# Patient Record
Sex: Male | Born: 1960 | Race: White | Hispanic: No | Marital: Single | Smoking: Never smoker
Health system: Southern US, Community
[De-identification: ages and names within clinical notes are randomized; demographics above are authoritative.]

## PROBLEM LIST (undated history)

## (undated) DIAGNOSIS — F329 Major depressive disorder, single episode, unspecified: Secondary | ICD-10-CM

## (undated) DIAGNOSIS — F99 Mental disorder, not otherwise specified: Secondary | ICD-10-CM

## (undated) DIAGNOSIS — E785 Hyperlipidemia, unspecified: Secondary | ICD-10-CM

## (undated) DIAGNOSIS — F32A Depression, unspecified: Secondary | ICD-10-CM

## (undated) DIAGNOSIS — E119 Type 2 diabetes mellitus without complications: Secondary | ICD-10-CM

## (undated) DIAGNOSIS — I10 Essential (primary) hypertension: Secondary | ICD-10-CM

## (undated) HISTORY — DX: Mental disorder, not otherwise specified: F99

## (undated) HISTORY — DX: Major depressive disorder, single episode, unspecified: F32.9

## (undated) HISTORY — DX: Essential (primary) hypertension: I10

## (undated) HISTORY — DX: Depression, unspecified: F32.A

## (undated) HISTORY — DX: Hyperlipidemia, unspecified: E78.5

## (undated) HISTORY — DX: Type 2 diabetes mellitus without complications: E11.9

## (undated) HISTORY — PX: VENTRAL HERNIA REPAIR: SHX424

## (undated) HISTORY — PX: LAPAROSCOPIC CHOLECYSTECTOMY: SUR755

---

## 2004-02-15 ENCOUNTER — Other Ambulatory Visit: Payer: Self-pay

## 2005-11-12 ENCOUNTER — Ambulatory Visit: Payer: Self-pay | Admitting: Gastroenterology

## 2005-11-25 ENCOUNTER — Ambulatory Visit: Payer: Self-pay | Admitting: Unknown Physician Specialty

## 2006-05-19 ENCOUNTER — Encounter: Payer: Self-pay | Admitting: Family Medicine

## 2006-06-04 ENCOUNTER — Encounter: Payer: Self-pay | Admitting: Family Medicine

## 2007-02-04 DIAGNOSIS — F3012 Manic episode without psychotic symptoms, moderate: Secondary | ICD-10-CM | POA: Insufficient documentation

## 2007-02-04 DIAGNOSIS — I10 Essential (primary) hypertension: Secondary | ICD-10-CM | POA: Insufficient documentation

## 2007-06-10 ENCOUNTER — Ambulatory Visit: Payer: Self-pay | Admitting: Family Medicine

## 2007-06-23 ENCOUNTER — Ambulatory Visit: Payer: Self-pay | Admitting: Family Medicine

## 2007-07-05 ENCOUNTER — Ambulatory Visit: Payer: Self-pay | Admitting: Internal Medicine

## 2007-07-07 ENCOUNTER — Ambulatory Visit: Payer: Self-pay | Admitting: Internal Medicine

## 2007-07-08 ENCOUNTER — Ambulatory Visit: Payer: Self-pay | Admitting: Gastroenterology

## 2007-07-08 LAB — HM COLONOSCOPY: HM COLON: NORMAL

## 2007-08-05 ENCOUNTER — Ambulatory Visit: Payer: Self-pay | Admitting: Internal Medicine

## 2007-10-12 ENCOUNTER — Ambulatory Visit: Payer: Self-pay | Admitting: Internal Medicine

## 2008-06-27 DIAGNOSIS — B351 Tinea unguium: Secondary | ICD-10-CM | POA: Insufficient documentation

## 2009-02-15 LAB — HM DEXA SCAN: HM DEXA SCAN: NORMAL

## 2010-03-07 ENCOUNTER — Emergency Department: Payer: Self-pay | Admitting: Emergency Medicine

## 2010-05-01 ENCOUNTER — Ambulatory Visit: Payer: Self-pay | Admitting: Surgery

## 2010-05-06 ENCOUNTER — Ambulatory Visit: Payer: Self-pay | Admitting: General Practice

## 2010-05-07 ENCOUNTER — Ambulatory Visit: Payer: Self-pay | Admitting: Urology

## 2010-05-22 ENCOUNTER — Ambulatory Visit: Payer: Self-pay | Admitting: Urology

## 2010-06-04 ENCOUNTER — Ambulatory Visit: Payer: Self-pay | Admitting: Urology

## 2010-09-17 ENCOUNTER — Ambulatory Visit: Payer: Self-pay | Admitting: Surgery

## 2010-09-17 DIAGNOSIS — I10 Essential (primary) hypertension: Secondary | ICD-10-CM

## 2010-09-24 ENCOUNTER — Ambulatory Visit: Payer: Self-pay | Admitting: Surgery

## 2010-11-26 ENCOUNTER — Ambulatory Visit: Payer: Self-pay | Admitting: Urology

## 2010-12-03 ENCOUNTER — Ambulatory Visit: Payer: Self-pay | Admitting: Urology

## 2010-12-03 HISTORY — PX: LAPAROSCOPY: SHX197

## 2011-08-07 DIAGNOSIS — F79 Unspecified intellectual disabilities: Secondary | ICD-10-CM | POA: Diagnosis not present

## 2011-08-07 DIAGNOSIS — R109 Unspecified abdominal pain: Secondary | ICD-10-CM | POA: Diagnosis not present

## 2011-08-07 DIAGNOSIS — F71 Moderate intellectual disabilities: Secondary | ICD-10-CM | POA: Diagnosis not present

## 2011-08-07 DIAGNOSIS — R52 Pain, unspecified: Secondary | ICD-10-CM | POA: Diagnosis not present

## 2011-08-07 DIAGNOSIS — R197 Diarrhea, unspecified: Secondary | ICD-10-CM | POA: Diagnosis not present

## 2011-08-07 DIAGNOSIS — R112 Nausea with vomiting, unspecified: Secondary | ICD-10-CM | POA: Diagnosis not present

## 2011-09-03 ENCOUNTER — Emergency Department: Payer: Self-pay

## 2011-09-03 DIAGNOSIS — R52 Pain, unspecified: Secondary | ICD-10-CM | POA: Diagnosis not present

## 2011-09-03 DIAGNOSIS — R109 Unspecified abdominal pain: Secondary | ICD-10-CM | POA: Diagnosis not present

## 2011-09-03 DIAGNOSIS — R079 Chest pain, unspecified: Secondary | ICD-10-CM | POA: Diagnosis not present

## 2011-09-03 LAB — COMPREHENSIVE METABOLIC PANEL
Albumin: 3.5 g/dL (ref 3.4–5.0)
Anion Gap: 10 (ref 7–16)
Bilirubin,Total: 0.4 mg/dL (ref 0.2–1.0)
Calcium, Total: 8.4 mg/dL — ABNORMAL LOW (ref 8.5–10.1)
Chloride: 102 mmol/L (ref 98–107)
Co2: 29 mmol/L (ref 21–32)
Creatinine: 0.84 mg/dL (ref 0.60–1.30)
EGFR (African American): >60
EGFR (Non-African Amer.): >60
Osmolality: 283 (ref 275–301)
Potassium: 3.9 mmol/L (ref 3.5–5.1)
SGOT(AST): 33 U/L (ref 15–37)
Sodium: 141 mmol/L (ref 136–145)

## 2011-09-03 LAB — TROPONIN I
Troponin-I: <0.02 ng/mL
Troponin-I: <0.02 ng/mL

## 2011-09-03 LAB — URINALYSIS, COMPLETE
Bilirubin,UR: NEGATIVE
Blood: NEGATIVE
Ketone: NEGATIVE
Ph: 6 (ref 4.5–8.0)
Protein: NEGATIVE
Specific Gravity: 1.011 (ref 1.003–1.030)
Squamous Epithelial: 1

## 2011-09-03 LAB — CBC
HCT: 44.3 % (ref 40.0–52.0)
HGB: 14.9 g/dL (ref 13.0–18.0)
MCH: 32.5 pg (ref 26.0–34.0)
RBC: 4.58 10*6/uL (ref 4.40–5.90)
RDW: 12.5 % (ref 11.5–14.5)
WBC: 8 10*3/uL (ref 3.8–10.6)

## 2011-09-03 LAB — CK TOTAL AND CKMB (NOT AT ARMC): CK-MB: <0.5 ng/mL — ABNORMAL LOW (ref 0.5–3.6)

## 2011-09-19 DIAGNOSIS — F71 Moderate intellectual disabilities: Secondary | ICD-10-CM | POA: Diagnosis not present

## 2011-10-03 DIAGNOSIS — K5282 Eosinophilic colitis: Secondary | ICD-10-CM | POA: Diagnosis not present

## 2011-10-03 DIAGNOSIS — R509 Fever, unspecified: Secondary | ICD-10-CM | POA: Diagnosis not present

## 2011-10-03 DIAGNOSIS — R112 Nausea with vomiting, unspecified: Secondary | ICD-10-CM | POA: Diagnosis not present

## 2011-10-08 ENCOUNTER — Emergency Department: Payer: Self-pay | Admitting: Unknown Physician Specialty

## 2011-10-08 DIAGNOSIS — R109 Unspecified abdominal pain: Secondary | ICD-10-CM | POA: Diagnosis not present

## 2011-10-08 DIAGNOSIS — E785 Hyperlipidemia, unspecified: Secondary | ICD-10-CM | POA: Diagnosis not present

## 2011-10-08 DIAGNOSIS — Z7982 Long term (current) use of aspirin: Secondary | ICD-10-CM | POA: Diagnosis not present

## 2011-10-08 DIAGNOSIS — I10 Essential (primary) hypertension: Secondary | ICD-10-CM | POA: Diagnosis not present

## 2011-10-08 DIAGNOSIS — F329 Major depressive disorder, single episode, unspecified: Secondary | ICD-10-CM | POA: Diagnosis not present

## 2011-10-08 DIAGNOSIS — F3289 Other specified depressive episodes: Secondary | ICD-10-CM | POA: Diagnosis not present

## 2011-10-08 DIAGNOSIS — F411 Generalized anxiety disorder: Secondary | ICD-10-CM | POA: Diagnosis not present

## 2011-10-08 DIAGNOSIS — Z79899 Other long term (current) drug therapy: Secondary | ICD-10-CM | POA: Diagnosis not present

## 2011-10-08 DIAGNOSIS — G4730 Sleep apnea, unspecified: Secondary | ICD-10-CM | POA: Diagnosis not present

## 2011-10-08 LAB — COMPREHENSIVE METABOLIC PANEL
Albumin: 3.8 g/dL (ref 3.4–5.0)
Alkaline Phosphatase: 145 U/L — ABNORMAL HIGH (ref 50–136)
Anion Gap: 10 (ref 7–16)
BUN: 12 mg/dL (ref 7–18)
Bilirubin,Total: 0.3 mg/dL (ref 0.2–1.0)
Co2: 30 mmol/L (ref 21–32)
Creatinine: 1 mg/dL (ref 0.60–1.30)
EGFR (African American): >60
Glucose: 138 mg/dL — ABNORMAL HIGH (ref 65–99)
Osmolality: 283 (ref 275–301)
Potassium: 4 mmol/L (ref 3.5–5.1)
SGPT (ALT): 76 U/L
Sodium: 141 mmol/L (ref 136–145)
Total Protein: 7.8 g/dL (ref 6.4–8.2)

## 2011-10-08 LAB — URINALYSIS, COMPLETE
Bilirubin,UR: NEGATIVE
Blood: NEGATIVE
Glucose,UR: NEGATIVE mg/dL (ref 0–75)
Ketone: NEGATIVE
Leukocyte Esterase: NEGATIVE
Nitrite: NEGATIVE
RBC,UR: <1 /HPF (ref 0–5)
Squamous Epithelial: 2

## 2011-10-08 LAB — CBC
MCH: 33 pg (ref 26.0–34.0)
MCHC: 34.6 g/dL (ref 32.0–36.0)
MCV: 95 fL (ref 80–100)
Platelet: 152 10*3/uL (ref 150–440)
RDW: 12.6 % (ref 11.5–14.5)

## 2011-10-08 LAB — LIPASE, BLOOD: Lipase: 147 U/L (ref 73–393)

## 2011-10-13 ENCOUNTER — Emergency Department: Payer: Self-pay | Admitting: Unknown Physician Specialty

## 2011-10-13 DIAGNOSIS — B351 Tinea unguium: Secondary | ICD-10-CM | POA: Diagnosis not present

## 2011-10-13 DIAGNOSIS — L259 Unspecified contact dermatitis, unspecified cause: Secondary | ICD-10-CM | POA: Diagnosis not present

## 2011-10-13 DIAGNOSIS — M79609 Pain in unspecified limb: Secondary | ICD-10-CM | POA: Diagnosis not present

## 2011-10-22 DIAGNOSIS — R109 Unspecified abdominal pain: Secondary | ICD-10-CM | POA: Diagnosis not present

## 2011-10-22 DIAGNOSIS — K219 Gastro-esophageal reflux disease without esophagitis: Secondary | ICD-10-CM | POA: Diagnosis not present

## 2011-10-22 DIAGNOSIS — R110 Nausea: Secondary | ICD-10-CM | POA: Diagnosis not present

## 2011-10-29 DIAGNOSIS — N401 Enlarged prostate with lower urinary tract symptoms: Secondary | ICD-10-CM | POA: Diagnosis not present

## 2011-10-29 DIAGNOSIS — R339 Retention of urine, unspecified: Secondary | ICD-10-CM | POA: Diagnosis not present

## 2011-10-29 DIAGNOSIS — IMO0002 Reserved for concepts with insufficient information to code with codable children: Secondary | ICD-10-CM | POA: Diagnosis not present

## 2011-11-12 ENCOUNTER — Ambulatory Visit: Payer: Self-pay | Admitting: Gastroenterology

## 2011-11-12 DIAGNOSIS — E669 Obesity, unspecified: Secondary | ICD-10-CM | POA: Diagnosis not present

## 2011-11-12 DIAGNOSIS — I10 Essential (primary) hypertension: Secondary | ICD-10-CM | POA: Diagnosis not present

## 2011-11-12 DIAGNOSIS — R110 Nausea: Secondary | ICD-10-CM | POA: Diagnosis not present

## 2011-11-12 DIAGNOSIS — K2970 Gastritis, unspecified, without bleeding: Secondary | ICD-10-CM | POA: Diagnosis not present

## 2011-11-12 DIAGNOSIS — G4730 Sleep apnea, unspecified: Secondary | ICD-10-CM | POA: Diagnosis not present

## 2011-11-12 DIAGNOSIS — K2990 Gastroduodenitis, unspecified, without bleeding: Secondary | ICD-10-CM | POA: Diagnosis not present

## 2011-11-12 DIAGNOSIS — G40909 Epilepsy, unspecified, not intractable, without status epilepticus: Secondary | ICD-10-CM | POA: Diagnosis not present

## 2011-11-12 DIAGNOSIS — R1013 Epigastric pain: Secondary | ICD-10-CM | POA: Diagnosis not present

## 2011-11-12 DIAGNOSIS — Z79899 Other long term (current) drug therapy: Secondary | ICD-10-CM | POA: Diagnosis not present

## 2011-11-12 DIAGNOSIS — G609 Hereditary and idiopathic neuropathy, unspecified: Secondary | ICD-10-CM | POA: Diagnosis not present

## 2011-11-12 DIAGNOSIS — I509 Heart failure, unspecified: Secondary | ICD-10-CM | POA: Diagnosis not present

## 2011-11-12 DIAGNOSIS — Z7982 Long term (current) use of aspirin: Secondary | ICD-10-CM | POA: Diagnosis not present

## 2011-11-12 DIAGNOSIS — R109 Unspecified abdominal pain: Secondary | ICD-10-CM | POA: Diagnosis not present

## 2011-11-12 DIAGNOSIS — K219 Gastro-esophageal reflux disease without esophagitis: Secondary | ICD-10-CM | POA: Diagnosis not present

## 2011-11-14 LAB — PATHOLOGY REPORT

## 2011-12-10 DIAGNOSIS — K219 Gastro-esophageal reflux disease without esophagitis: Secondary | ICD-10-CM | POA: Diagnosis not present

## 2012-01-05 DIAGNOSIS — IMO0002 Reserved for concepts with insufficient information to code with codable children: Secondary | ICD-10-CM | POA: Diagnosis not present

## 2012-01-05 DIAGNOSIS — N390 Urinary tract infection, site not specified: Secondary | ICD-10-CM | POA: Diagnosis not present

## 2012-01-12 DIAGNOSIS — B351 Tinea unguium: Secondary | ICD-10-CM | POA: Diagnosis not present

## 2012-01-12 DIAGNOSIS — M79609 Pain in unspecified limb: Secondary | ICD-10-CM | POA: Diagnosis not present

## 2012-01-29 DIAGNOSIS — F71 Moderate intellectual disabilities: Secondary | ICD-10-CM | POA: Diagnosis not present

## 2012-01-30 DIAGNOSIS — F3289 Other specified depressive episodes: Secondary | ICD-10-CM | POA: Diagnosis not present

## 2012-01-30 DIAGNOSIS — E039 Hypothyroidism, unspecified: Secondary | ICD-10-CM | POA: Diagnosis not present

## 2012-01-30 DIAGNOSIS — Z23 Encounter for immunization: Secondary | ICD-10-CM | POA: Diagnosis not present

## 2012-01-30 DIAGNOSIS — I10 Essential (primary) hypertension: Secondary | ICD-10-CM | POA: Diagnosis not present

## 2012-01-30 DIAGNOSIS — F329 Major depressive disorder, single episode, unspecified: Secondary | ICD-10-CM | POA: Diagnosis not present

## 2012-01-30 DIAGNOSIS — IMO0001 Reserved for inherently not codable concepts without codable children: Secondary | ICD-10-CM | POA: Diagnosis not present

## 2012-02-02 DIAGNOSIS — E039 Hypothyroidism, unspecified: Secondary | ICD-10-CM | POA: Diagnosis not present

## 2012-02-24 ENCOUNTER — Ambulatory Visit: Payer: Self-pay | Admitting: Family Medicine

## 2012-02-24 DIAGNOSIS — F329 Major depressive disorder, single episode, unspecified: Secondary | ICD-10-CM | POA: Diagnosis not present

## 2012-02-24 DIAGNOSIS — Z0000 Encounter for general adult medical examination without abnormal findings: Secondary | ICD-10-CM | POA: Diagnosis not present

## 2012-02-24 DIAGNOSIS — E785 Hyperlipidemia, unspecified: Secondary | ICD-10-CM | POA: Diagnosis not present

## 2012-02-24 DIAGNOSIS — Z7189 Other specified counseling: Secondary | ICD-10-CM | POA: Diagnosis not present

## 2012-02-24 DIAGNOSIS — E119 Type 2 diabetes mellitus without complications: Secondary | ICD-10-CM | POA: Diagnosis not present

## 2012-02-24 DIAGNOSIS — F3289 Other specified depressive episodes: Secondary | ICD-10-CM | POA: Diagnosis not present

## 2012-02-25 DIAGNOSIS — Z125 Encounter for screening for malignant neoplasm of prostate: Secondary | ICD-10-CM | POA: Diagnosis not present

## 2012-02-25 DIAGNOSIS — E119 Type 2 diabetes mellitus without complications: Secondary | ICD-10-CM | POA: Diagnosis not present

## 2012-02-25 DIAGNOSIS — Z0000 Encounter for general adult medical examination without abnormal findings: Secondary | ICD-10-CM | POA: Diagnosis not present

## 2012-03-04 ENCOUNTER — Ambulatory Visit: Payer: Self-pay | Admitting: Family Medicine

## 2012-03-04 DIAGNOSIS — Z7189 Other specified counseling: Secondary | ICD-10-CM | POA: Diagnosis not present

## 2012-03-04 DIAGNOSIS — E119 Type 2 diabetes mellitus without complications: Secondary | ICD-10-CM | POA: Diagnosis not present

## 2012-03-11 DIAGNOSIS — IMO0002 Reserved for concepts with insufficient information to code with codable children: Secondary | ICD-10-CM | POA: Diagnosis not present

## 2012-03-31 DIAGNOSIS — I10 Essential (primary) hypertension: Secondary | ICD-10-CM | POA: Diagnosis not present

## 2012-03-31 DIAGNOSIS — Z23 Encounter for immunization: Secondary | ICD-10-CM | POA: Diagnosis not present

## 2012-03-31 DIAGNOSIS — IMO0001 Reserved for inherently not codable concepts without codable children: Secondary | ICD-10-CM | POA: Diagnosis not present

## 2012-04-04 ENCOUNTER — Ambulatory Visit: Payer: Self-pay | Admitting: Family Medicine

## 2012-04-04 DIAGNOSIS — Z7189 Other specified counseling: Secondary | ICD-10-CM | POA: Diagnosis not present

## 2012-04-04 DIAGNOSIS — E119 Type 2 diabetes mellitus without complications: Secondary | ICD-10-CM | POA: Diagnosis not present

## 2012-04-13 DIAGNOSIS — M79609 Pain in unspecified limb: Secondary | ICD-10-CM | POA: Diagnosis not present

## 2012-04-13 DIAGNOSIS — B351 Tinea unguium: Secondary | ICD-10-CM | POA: Diagnosis not present

## 2012-04-28 DIAGNOSIS — F71 Moderate intellectual disabilities: Secondary | ICD-10-CM | POA: Diagnosis not present

## 2012-05-04 ENCOUNTER — Ambulatory Visit: Payer: Self-pay | Admitting: Family Medicine

## 2012-05-07 DIAGNOSIS — R159 Full incontinence of feces: Secondary | ICD-10-CM | POA: Diagnosis not present

## 2012-05-07 DIAGNOSIS — F329 Major depressive disorder, single episode, unspecified: Secondary | ICD-10-CM | POA: Diagnosis not present

## 2012-05-07 DIAGNOSIS — F3012 Manic episode without psychotic symptoms, moderate: Secondary | ICD-10-CM | POA: Diagnosis not present

## 2012-05-07 DIAGNOSIS — F3289 Other specified depressive episodes: Secondary | ICD-10-CM | POA: Diagnosis not present

## 2012-05-07 DIAGNOSIS — R1084 Generalized abdominal pain: Secondary | ICD-10-CM | POA: Diagnosis not present

## 2012-06-04 DIAGNOSIS — I10 Essential (primary) hypertension: Secondary | ICD-10-CM | POA: Diagnosis not present

## 2012-06-04 DIAGNOSIS — E039 Hypothyroidism, unspecified: Secondary | ICD-10-CM | POA: Diagnosis not present

## 2012-06-04 DIAGNOSIS — F329 Major depressive disorder, single episode, unspecified: Secondary | ICD-10-CM | POA: Diagnosis not present

## 2012-06-04 DIAGNOSIS — F3289 Other specified depressive episodes: Secondary | ICD-10-CM | POA: Diagnosis not present

## 2012-06-04 DIAGNOSIS — E119 Type 2 diabetes mellitus without complications: Secondary | ICD-10-CM | POA: Diagnosis not present

## 2012-06-07 DIAGNOSIS — F70 Mild intellectual disabilities: Secondary | ICD-10-CM | POA: Diagnosis not present

## 2012-06-08 DIAGNOSIS — F70 Mild intellectual disabilities: Secondary | ICD-10-CM | POA: Diagnosis not present

## 2012-06-09 DIAGNOSIS — E119 Type 2 diabetes mellitus without complications: Secondary | ICD-10-CM | POA: Diagnosis not present

## 2012-06-16 DIAGNOSIS — Z23 Encounter for immunization: Secondary | ICD-10-CM | POA: Diagnosis not present

## 2012-07-09 DIAGNOSIS — N401 Enlarged prostate with lower urinary tract symptoms: Secondary | ICD-10-CM | POA: Diagnosis not present

## 2012-07-09 DIAGNOSIS — R339 Retention of urine, unspecified: Secondary | ICD-10-CM | POA: Diagnosis not present

## 2012-07-09 DIAGNOSIS — IMO0002 Reserved for concepts with insufficient information to code with codable children: Secondary | ICD-10-CM | POA: Diagnosis not present

## 2012-07-09 DIAGNOSIS — R300 Dysuria: Secondary | ICD-10-CM | POA: Insufficient documentation

## 2012-07-09 DIAGNOSIS — N138 Other obstructive and reflux uropathy: Secondary | ICD-10-CM | POA: Insufficient documentation

## 2012-07-15 DIAGNOSIS — B351 Tinea unguium: Secondary | ICD-10-CM | POA: Diagnosis not present

## 2012-07-15 DIAGNOSIS — M79609 Pain in unspecified limb: Secondary | ICD-10-CM | POA: Diagnosis not present

## 2012-08-02 DIAGNOSIS — E119 Type 2 diabetes mellitus without complications: Secondary | ICD-10-CM | POA: Diagnosis not present

## 2012-08-02 DIAGNOSIS — F3012 Manic episode without psychotic symptoms, moderate: Secondary | ICD-10-CM | POA: Diagnosis not present

## 2012-08-02 DIAGNOSIS — E785 Hyperlipidemia, unspecified: Secondary | ICD-10-CM | POA: Diagnosis not present

## 2012-08-02 DIAGNOSIS — I10 Essential (primary) hypertension: Secondary | ICD-10-CM | POA: Diagnosis not present

## 2012-08-03 DIAGNOSIS — E785 Hyperlipidemia, unspecified: Secondary | ICD-10-CM | POA: Diagnosis not present

## 2012-08-10 DIAGNOSIS — F71 Moderate intellectual disabilities: Secondary | ICD-10-CM | POA: Diagnosis not present

## 2012-09-07 DIAGNOSIS — Z79899 Other long term (current) drug therapy: Secondary | ICD-10-CM | POA: Diagnosis not present

## 2012-10-14 DIAGNOSIS — M79609 Pain in unspecified limb: Secondary | ICD-10-CM | POA: Diagnosis not present

## 2012-10-14 DIAGNOSIS — B351 Tinea unguium: Secondary | ICD-10-CM | POA: Diagnosis not present

## 2012-11-12 DIAGNOSIS — K2960 Other gastritis without bleeding: Secondary | ICD-10-CM | POA: Diagnosis not present

## 2012-11-12 DIAGNOSIS — R112 Nausea with vomiting, unspecified: Secondary | ICD-10-CM | POA: Diagnosis not present

## 2012-11-12 DIAGNOSIS — I10 Essential (primary) hypertension: Secondary | ICD-10-CM | POA: Diagnosis not present

## 2012-12-07 DIAGNOSIS — F71 Moderate intellectual disabilities: Secondary | ICD-10-CM | POA: Diagnosis not present

## 2012-12-15 DIAGNOSIS — E785 Hyperlipidemia, unspecified: Secondary | ICD-10-CM | POA: Diagnosis not present

## 2012-12-15 DIAGNOSIS — E119 Type 2 diabetes mellitus without complications: Secondary | ICD-10-CM | POA: Diagnosis not present

## 2012-12-15 DIAGNOSIS — I10 Essential (primary) hypertension: Secondary | ICD-10-CM | POA: Diagnosis not present

## 2012-12-15 DIAGNOSIS — E039 Hypothyroidism, unspecified: Secondary | ICD-10-CM | POA: Diagnosis not present

## 2013-01-05 DIAGNOSIS — R339 Retention of urine, unspecified: Secondary | ICD-10-CM | POA: Diagnosis not present

## 2013-01-05 DIAGNOSIS — R300 Dysuria: Secondary | ICD-10-CM | POA: Diagnosis not present

## 2013-01-05 DIAGNOSIS — IMO0002 Reserved for concepts with insufficient information to code with codable children: Secondary | ICD-10-CM | POA: Diagnosis not present

## 2013-01-05 DIAGNOSIS — N401 Enlarged prostate with lower urinary tract symptoms: Secondary | ICD-10-CM | POA: Diagnosis not present

## 2013-01-13 DIAGNOSIS — B351 Tinea unguium: Secondary | ICD-10-CM | POA: Diagnosis not present

## 2013-01-13 DIAGNOSIS — M79609 Pain in unspecified limb: Secondary | ICD-10-CM | POA: Diagnosis not present

## 2013-02-08 DIAGNOSIS — F71 Moderate intellectual disabilities: Secondary | ICD-10-CM | POA: Diagnosis not present

## 2013-02-18 DIAGNOSIS — Z79899 Other long term (current) drug therapy: Secondary | ICD-10-CM | POA: Diagnosis not present

## 2013-03-03 DIAGNOSIS — Z0000 Encounter for general adult medical examination without abnormal findings: Secondary | ICD-10-CM | POA: Diagnosis not present

## 2013-03-09 DIAGNOSIS — F71 Moderate intellectual disabilities: Secondary | ICD-10-CM | POA: Diagnosis not present

## 2013-04-10 ENCOUNTER — Emergency Department: Payer: Self-pay | Admitting: Emergency Medicine

## 2013-04-10 DIAGNOSIS — I509 Heart failure, unspecified: Secondary | ICD-10-CM | POA: Diagnosis not present

## 2013-04-10 DIAGNOSIS — R109 Unspecified abdominal pain: Secondary | ICD-10-CM | POA: Diagnosis not present

## 2013-04-10 DIAGNOSIS — R1934 Left lower quadrant abdominal rigidity: Secondary | ICD-10-CM | POA: Diagnosis not present

## 2013-04-10 DIAGNOSIS — L02419 Cutaneous abscess of limb, unspecified: Secondary | ICD-10-CM | POA: Diagnosis not present

## 2013-04-10 DIAGNOSIS — R112 Nausea with vomiting, unspecified: Secondary | ICD-10-CM | POA: Diagnosis not present

## 2013-04-10 DIAGNOSIS — Z9889 Other specified postprocedural states: Secondary | ICD-10-CM | POA: Diagnosis not present

## 2013-04-10 DIAGNOSIS — R9431 Abnormal electrocardiogram [ECG] [EKG]: Secondary | ICD-10-CM | POA: Diagnosis not present

## 2013-04-10 DIAGNOSIS — R1110 Vomiting, unspecified: Secondary | ICD-10-CM | POA: Diagnosis not present

## 2013-04-10 DIAGNOSIS — R1033 Periumbilical pain: Secondary | ICD-10-CM | POA: Diagnosis not present

## 2013-04-10 DIAGNOSIS — I10 Essential (primary) hypertension: Secondary | ICD-10-CM | POA: Diagnosis not present

## 2013-04-10 LAB — CBC
HCT: 40.4 % (ref 40.0–52.0)
HGB: 14.4 g/dL (ref 13.0–18.0)
MCH: 32.8 pg (ref 26.0–34.0)
MCV: 92 fL (ref 80–100)
Platelet: 104 10*3/uL — ABNORMAL LOW (ref 150–440)
RDW: 12.6 % (ref 11.5–14.5)
WBC: 12 10*3/uL — ABNORMAL HIGH (ref 3.8–10.6)

## 2013-04-10 LAB — URINALYSIS, COMPLETE
Glucose,UR: NEGATIVE mg/dL (ref 0–75)
RBC,UR: 2 /HPF (ref 0–5)
Specific Gravity: 1.023 (ref 1.003–1.030)
Squamous Epithelial: 2

## 2013-04-10 LAB — COMPREHENSIVE METABOLIC PANEL
Alkaline Phosphatase: 114 U/L (ref 50–136)
Anion Gap: 7 (ref 7–16)
BUN: 14 mg/dL (ref 7–18)
Calcium, Total: 8.5 mg/dL (ref 8.5–10.1)
Co2: 27 mmol/L (ref 21–32)
Creatinine: 1.08 mg/dL (ref 0.60–1.30)
EGFR (Non-African Amer.): >60
Osmolality: 272 (ref 275–301)
Potassium: 3.7 mmol/L (ref 3.5–5.1)
SGOT(AST): 48 U/L — ABNORMAL HIGH (ref 15–37)
SGPT (ALT): 48 U/L (ref 12–78)
Sodium: 135 mmol/L — ABNORMAL LOW (ref 136–145)

## 2013-04-13 DIAGNOSIS — L02419 Cutaneous abscess of limb, unspecified: Secondary | ICD-10-CM | POA: Diagnosis not present

## 2013-04-13 DIAGNOSIS — E119 Type 2 diabetes mellitus without complications: Secondary | ICD-10-CM | POA: Diagnosis not present

## 2013-04-13 DIAGNOSIS — K5289 Other specified noninfective gastroenteritis and colitis: Secondary | ICD-10-CM | POA: Diagnosis not present

## 2013-04-13 DIAGNOSIS — Z23 Encounter for immunization: Secondary | ICD-10-CM | POA: Diagnosis not present

## 2013-04-15 DIAGNOSIS — I8390 Asymptomatic varicose veins of unspecified lower extremity: Secondary | ICD-10-CM | POA: Diagnosis not present

## 2013-04-15 DIAGNOSIS — L02419 Cutaneous abscess of limb, unspecified: Secondary | ICD-10-CM | POA: Diagnosis not present

## 2013-04-15 LAB — CULTURE, BLOOD (SINGLE)

## 2013-04-21 DIAGNOSIS — E039 Hypothyroidism, unspecified: Secondary | ICD-10-CM | POA: Diagnosis not present

## 2013-04-21 DIAGNOSIS — E119 Type 2 diabetes mellitus without complications: Secondary | ICD-10-CM | POA: Diagnosis not present

## 2013-04-21 DIAGNOSIS — B351 Tinea unguium: Secondary | ICD-10-CM | POA: Diagnosis not present

## 2013-04-21 DIAGNOSIS — I10 Essential (primary) hypertension: Secondary | ICD-10-CM | POA: Diagnosis not present

## 2013-04-21 DIAGNOSIS — M79609 Pain in unspecified limb: Secondary | ICD-10-CM | POA: Diagnosis not present

## 2013-06-07 DIAGNOSIS — F71 Moderate intellectual disabilities: Secondary | ICD-10-CM | POA: Diagnosis not present

## 2013-06-14 DIAGNOSIS — E119 Type 2 diabetes mellitus without complications: Secondary | ICD-10-CM | POA: Diagnosis not present

## 2013-06-24 DIAGNOSIS — R109 Unspecified abdominal pain: Secondary | ICD-10-CM | POA: Diagnosis not present

## 2013-06-24 DIAGNOSIS — R52 Pain, unspecified: Secondary | ICD-10-CM | POA: Diagnosis not present

## 2013-06-24 DIAGNOSIS — I10 Essential (primary) hypertension: Secondary | ICD-10-CM | POA: Diagnosis not present

## 2013-07-21 ENCOUNTER — Ambulatory Visit (INDEPENDENT_AMBULATORY_CARE_PROVIDER_SITE_OTHER): Payer: Medicare Other | Admitting: Podiatrist

## 2013-07-21 ENCOUNTER — Encounter: Payer: Self-pay | Admitting: Podiatrist

## 2013-07-21 VITALS — BP 121/76 | HR 55 | Resp 16 | Ht 73.0 in | Wt 271.4 lb

## 2013-07-21 DIAGNOSIS — B351 Tinea unguium: Secondary | ICD-10-CM

## 2013-07-21 DIAGNOSIS — M79609 Pain in unspecified limb: Secondary | ICD-10-CM | POA: Diagnosis not present

## 2013-07-21 NOTE — Progress Notes (Signed)
HPI:  Patient presents today for follow up of foot and nail care. Denies any new complaints today.  Objective:  Patients chart is reviewed.  Neurovascular status unchanged.  Patients nails are thickened, discolored, distrophic, friable and brittle with yellow-brown discoloration. Patient subjectively relates they are painful with shoes and with ambulation of bilateral feet.  Assessment:  Symptomatic onychomycosis  Plan:  Discussed treatment options and alternatives.  The symptomatic toenails were debrided through manual an mechanical means without complication.  Return appointment recommended at routine intervals of 3 months    Porshia Blizzard, DPM   

## 2013-07-26 IMAGING — CR DG CHEST 2V
1 series · 3 of 3 positions shown · non-contrast
Comparison: none

REASON FOR EXAM: chest pain
COMMENTS:   May transport without cardiac monitor

PROCEDURE:     DXR - DXR CHEST PA (OR AP) AND LATERAL  - September 03, 2011 [DATE]
RESULT:     No acute abnormality is identified. The lungs are clear. The
cardiovascular structures are unremarkable.

[Series 1: w chest pa · 0.14mm/px · 3 of 3 slices shown]
[im 1/3]
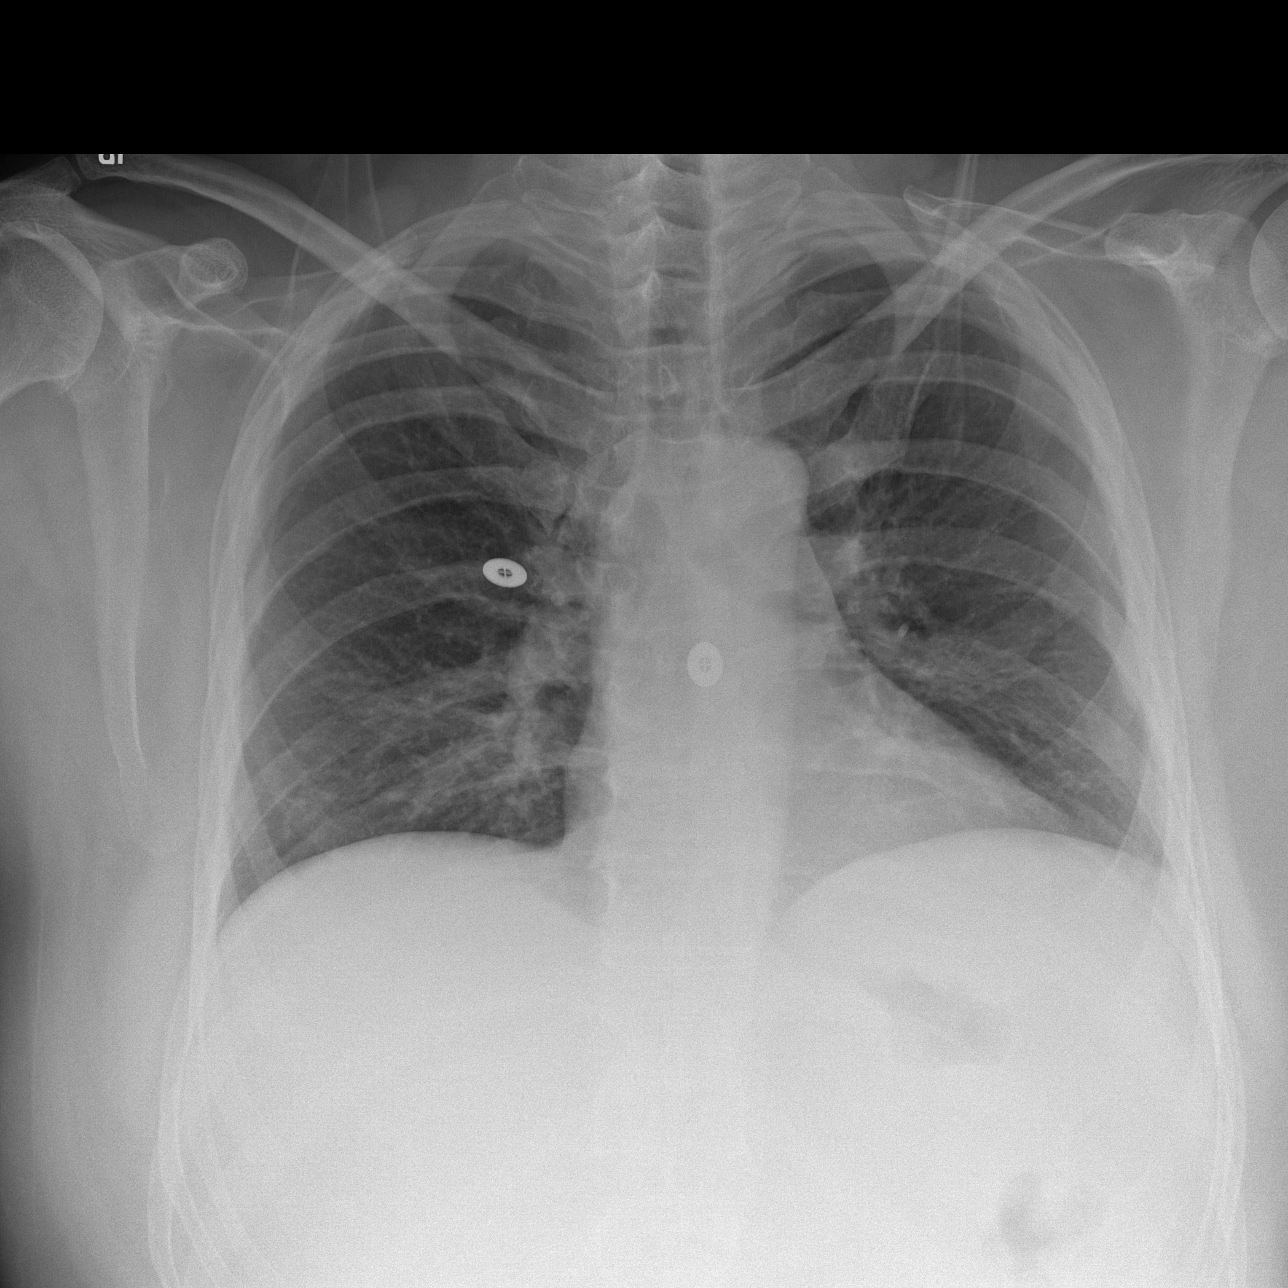
[im 2/3]
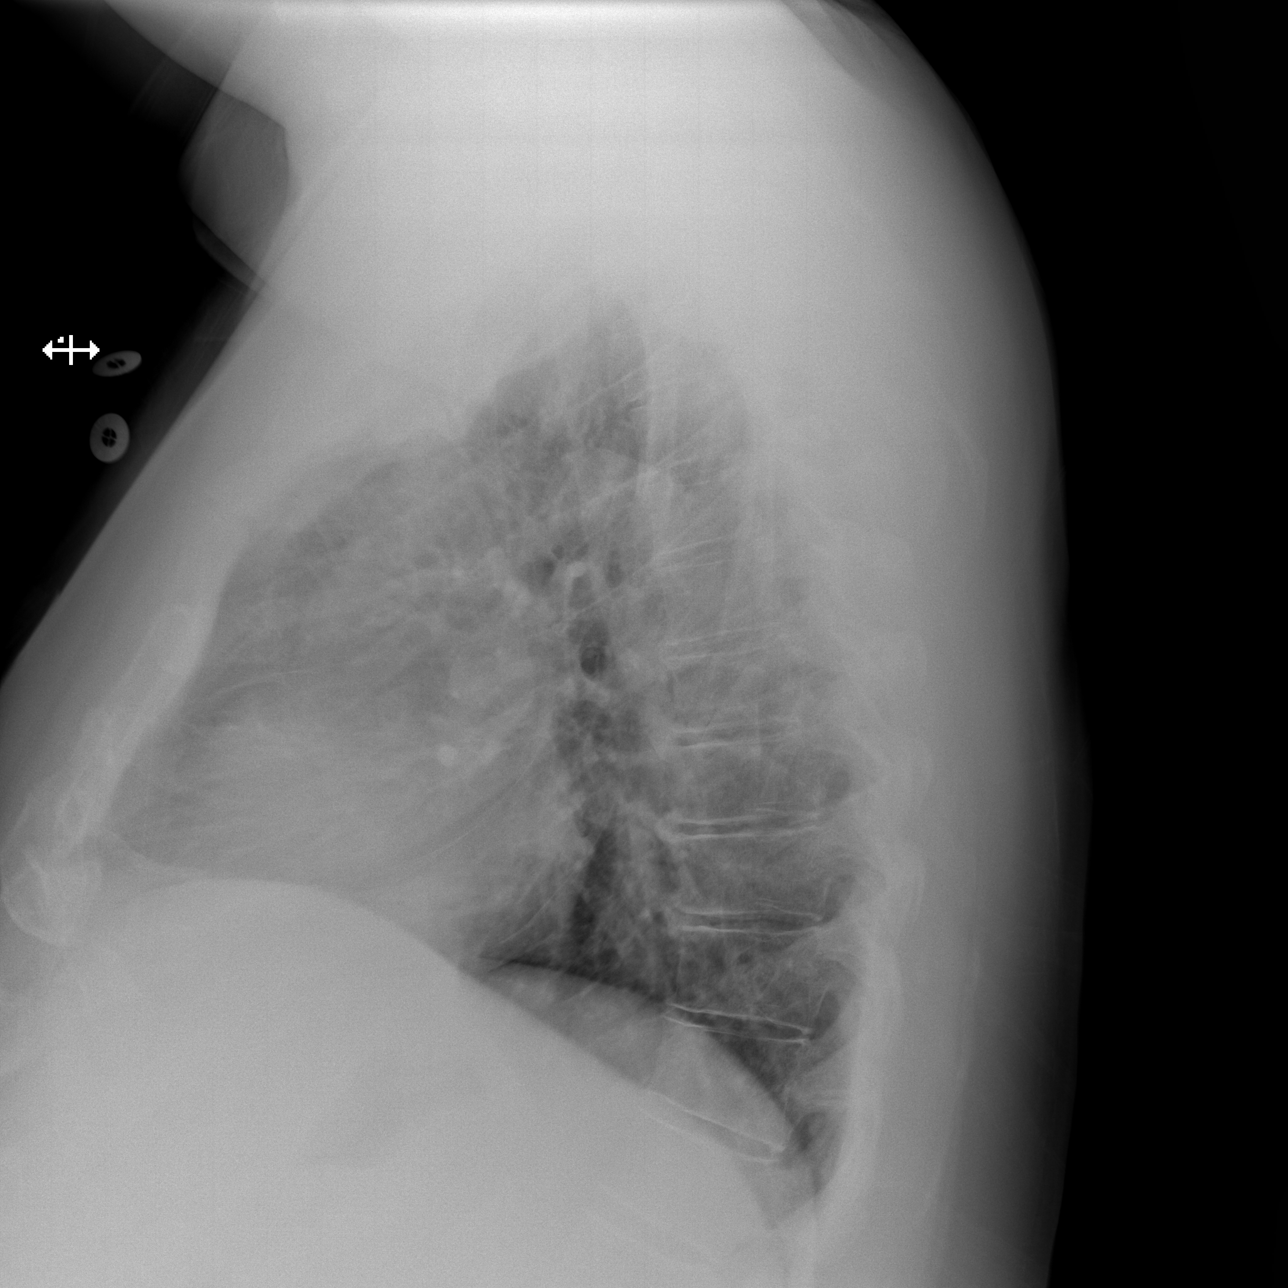
[im 3/3]
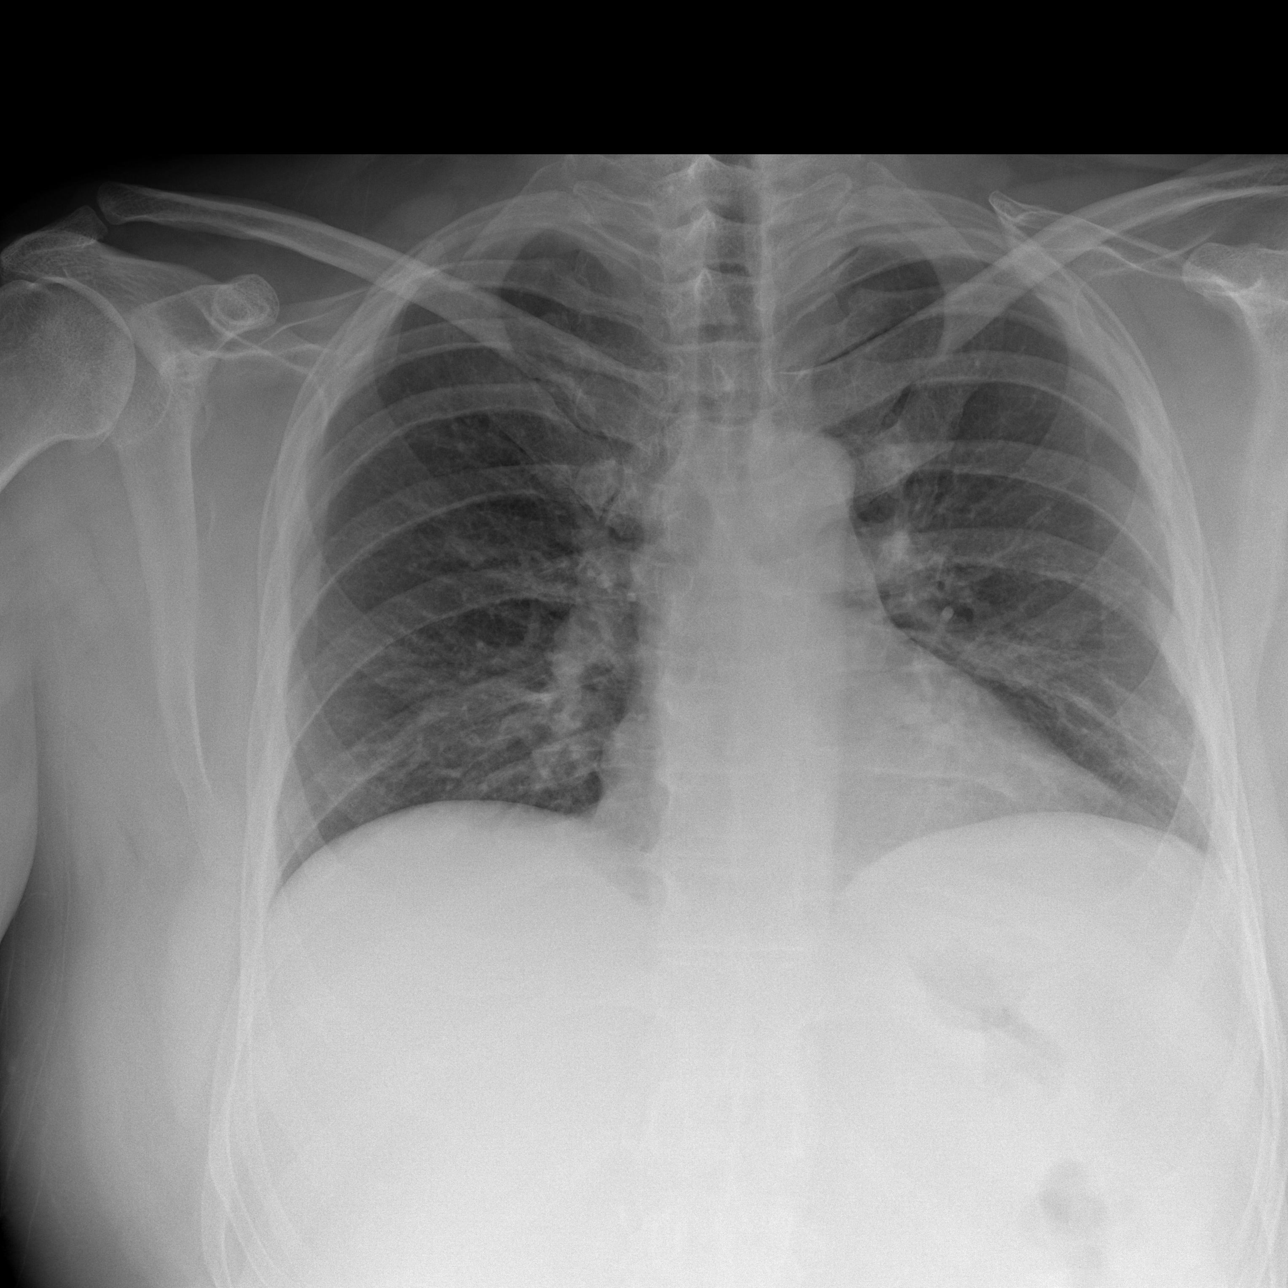

[3 of 3 positions shown; findings below may reference images not displayed]

IMPRESSION: No acute cardiopulmonary disease.

## 2013-08-30 IMAGING — CT CT ABD-PELV W/O CM
1 of 4 series · 13 of 32 positions shown, 19 images · non-contrast
Comparison: None

REASON FOR EXAM: (1) left flank luq pain; (2) luq flank pain
COMMENTS:

PROCEDURE:     CT  - CT ABDOMEN AND PELVIS W[DATE]  [DATE]
RESULT:     Indication: Left flank pain
TECHNIQUE: Multiple axial images from the lung bases to the symphysis pubis
were obtained without oral and without intravenous contrast.

[Series 2: soft tissue · axial · 0.87mm/px · z∈[-1560,-1116]mm · 13 of 174 slices shown, 19 images]
[im 13/174  soft-tissue]
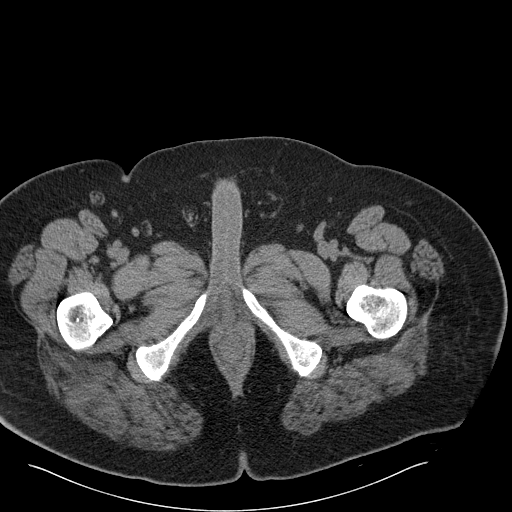
[im 13/174  bone]
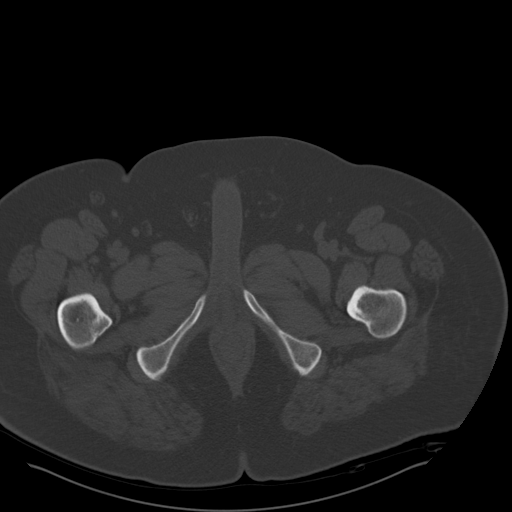
[im 25/174  soft-tissue]
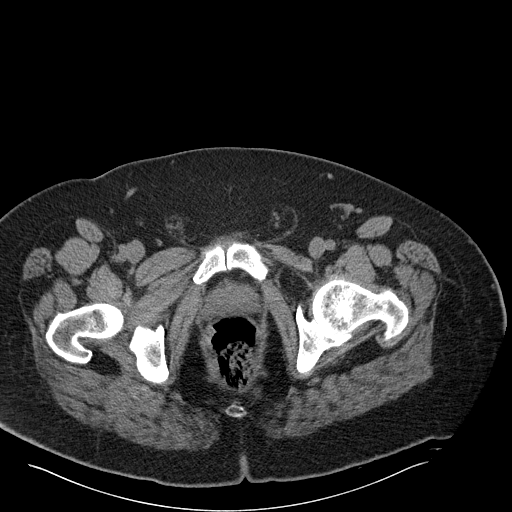
[im 38/174  soft-tissue]
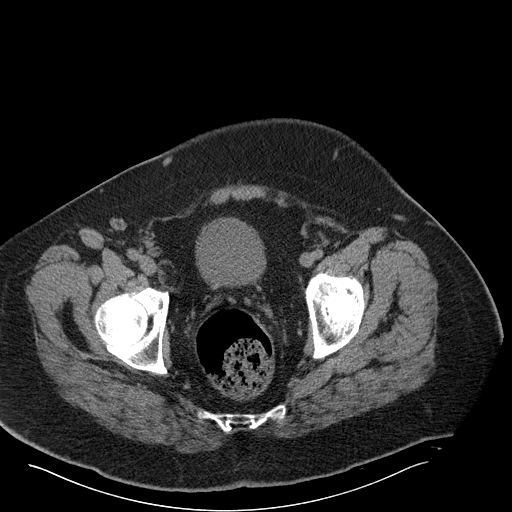
[im 50/174  soft-tissue]
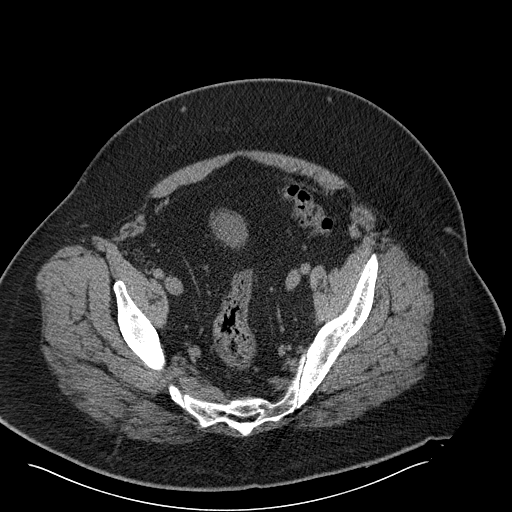
[im 62/174  soft-tissue]
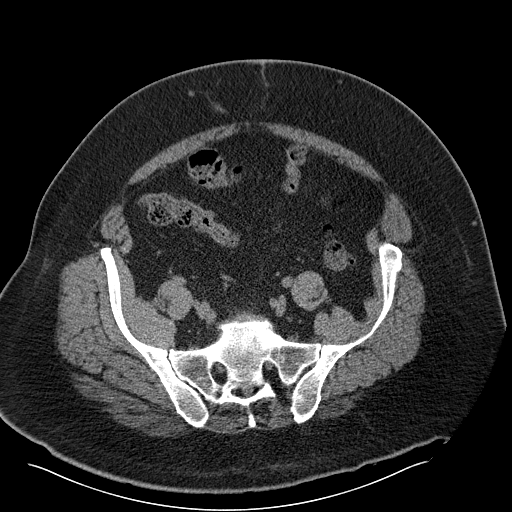
[im 75/174  soft-tissue]
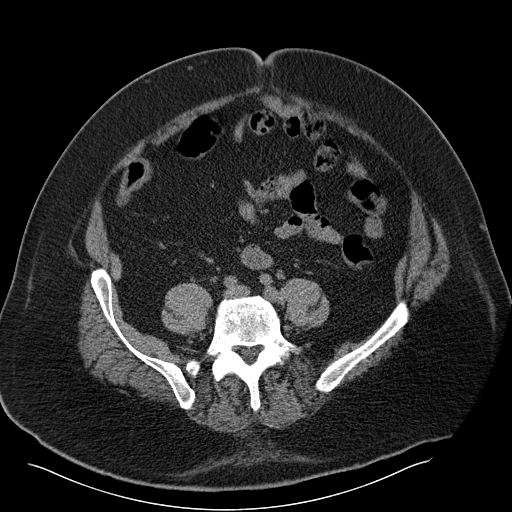
[im 87/174  soft-tissue]
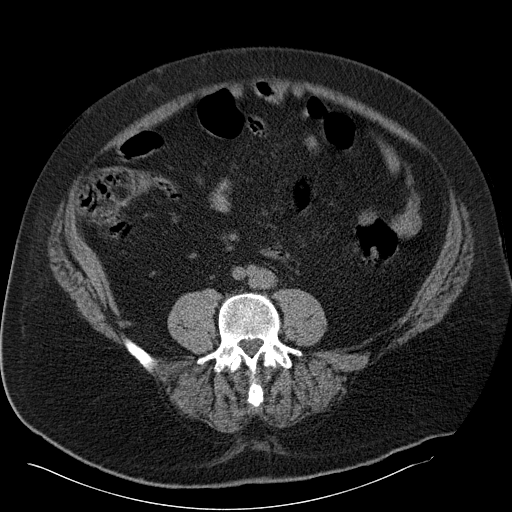
[im 99/174  soft-tissue]
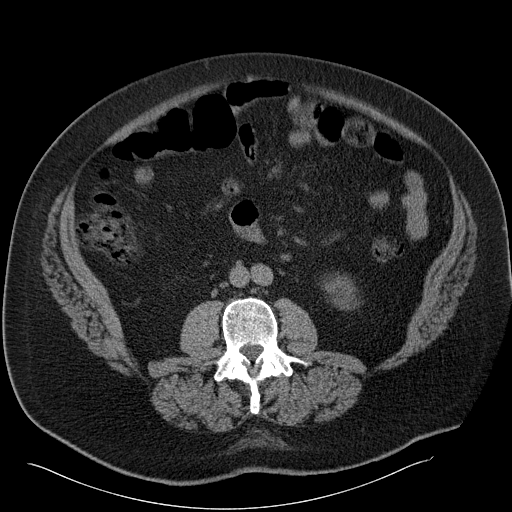
[im 112/174  soft-tissue]
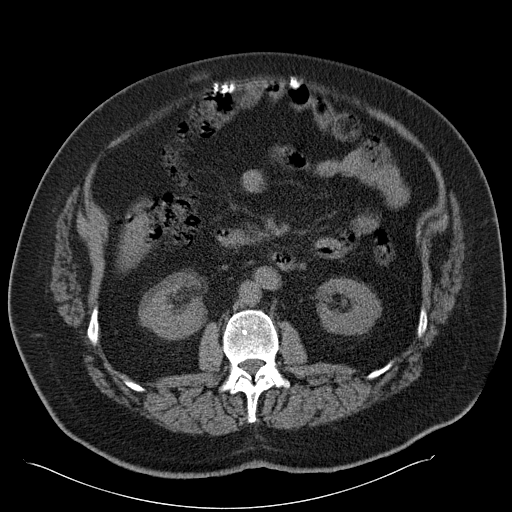
[im 112/174  bone]
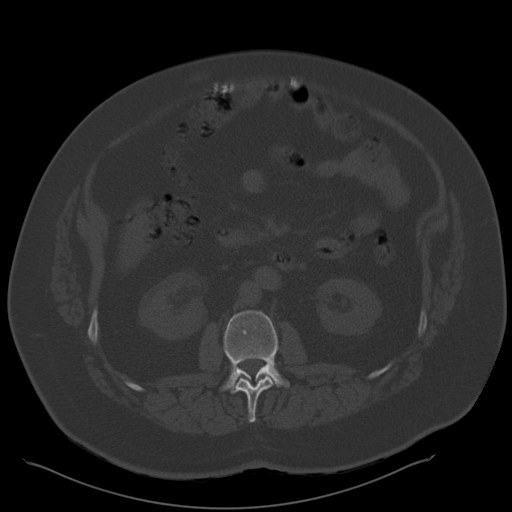
[im 124/174  soft-tissue]
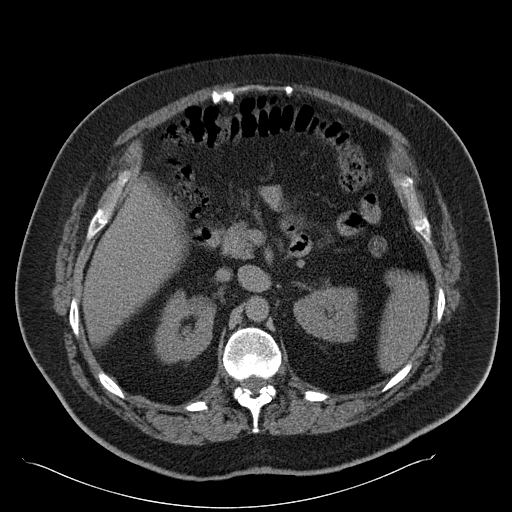
[im 124/174  lung]
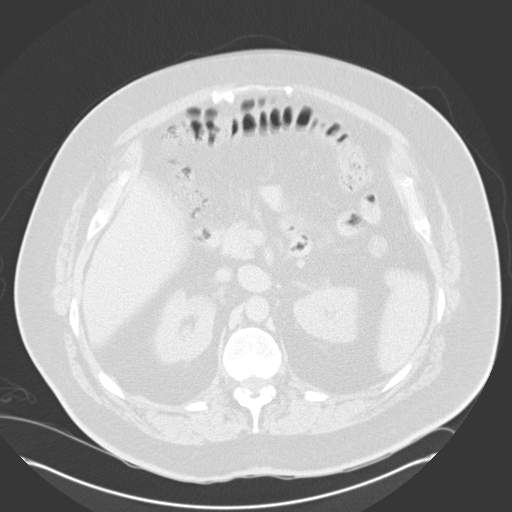
[im 136/174  soft-tissue]
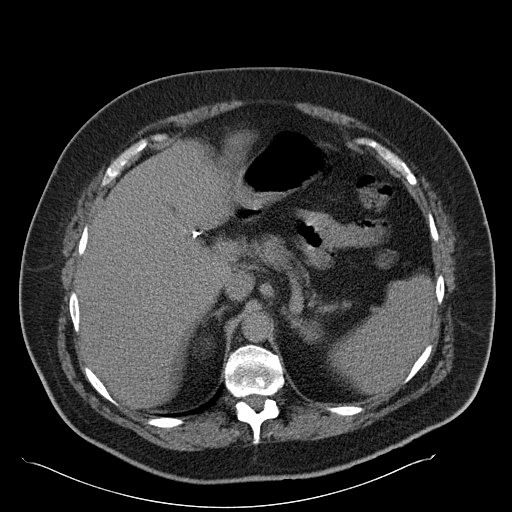
[im 136/174  lung]
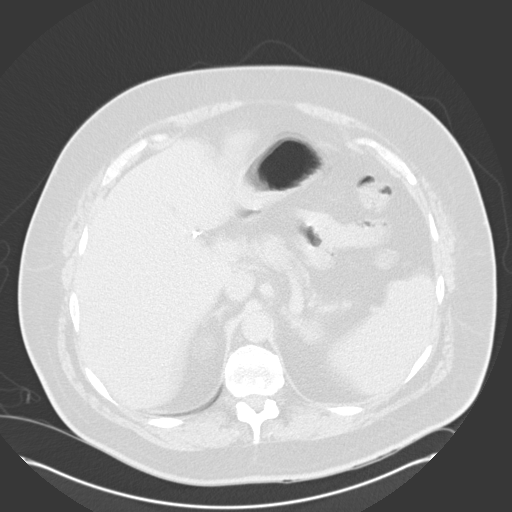
[im 149/174  soft-tissue]
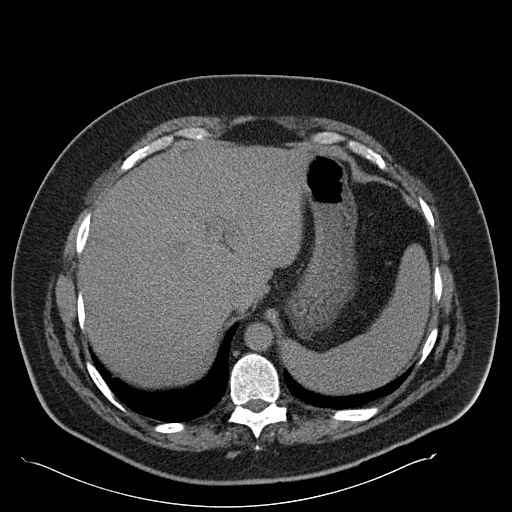
[im 149/174  lung]
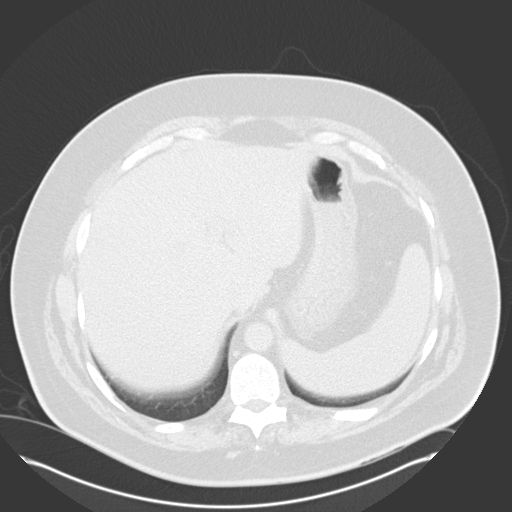
[im 161/174  soft-tissue]
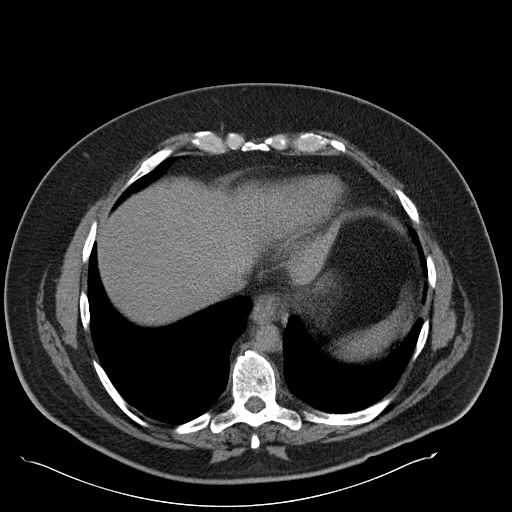
[im 161/174  lung]
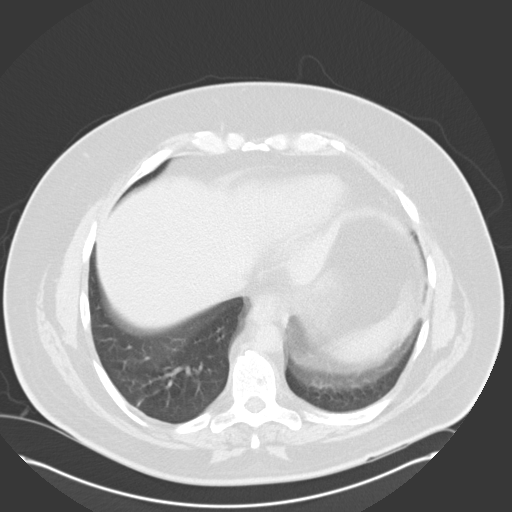

[13 of 32 positions shown; findings below may reference images not displayed]

FINDINGS: The lung bases are clear. There is no pleural or pericardial effusions.

No renal, ureteral, or bladder calculi. No obstructive uropathy. No
perinephric stranding is seen. The kidneys are symmetric in size without
evidence for exophytic mass. The bladder is unremarkable.

The liver demonstrates no focal abnormality. The gallbladder is surgically
absent. The spleen demonstrates no focal abnormality. The adrenal glands and
pancreas are normal.

The unopacified stomach, duodenum, small intestine, and large intestine are
unremarkable, but evaluation is limited by lack of oral contrast. There is a
normal caliber appendix in the right lower quadrant without periappendiceal
inflammatory changes. There is no pneumoperitoneum, pneumatosis, or portal
venous gas. There is no abdominal or pelvic free fluid. There is no
lymphadenopathy.

The abdominal aorta is normal in caliber .

The osseous structures are unremarkable.
IMPRESSION: 1. No urolithiasis or obstructive uropathy.

## 2013-08-31 DIAGNOSIS — D485 Neoplasm of uncertain behavior of skin: Secondary | ICD-10-CM | POA: Diagnosis not present

## 2013-10-05 DIAGNOSIS — L723 Sebaceous cyst: Secondary | ICD-10-CM | POA: Diagnosis not present

## 2013-10-05 DIAGNOSIS — D2110 Benign neoplasm of connective and other soft tissue of unspecified upper limb, including shoulder: Secondary | ICD-10-CM | POA: Diagnosis not present

## 2013-10-20 ENCOUNTER — Ambulatory Visit: Payer: Medicaid Other | Admitting: Podiatrist

## 2013-11-10 ENCOUNTER — Ambulatory Visit (INDEPENDENT_AMBULATORY_CARE_PROVIDER_SITE_OTHER): Payer: Medicare Other | Admitting: Podiatrist

## 2013-11-10 VITALS — Resp 16 | Ht 75.0 in | Wt 270.0 lb

## 2013-11-10 DIAGNOSIS — M79609 Pain in unspecified limb: Secondary | ICD-10-CM

## 2013-11-10 DIAGNOSIS — B351 Tinea unguium: Secondary | ICD-10-CM | POA: Diagnosis not present

## 2013-11-10 NOTE — Progress Notes (Signed)

## 2013-12-06 DIAGNOSIS — R300 Dysuria: Secondary | ICD-10-CM | POA: Diagnosis not present

## 2013-12-06 DIAGNOSIS — R109 Unspecified abdominal pain: Secondary | ICD-10-CM | POA: Diagnosis not present

## 2013-12-06 DIAGNOSIS — R319 Hematuria, unspecified: Secondary | ICD-10-CM | POA: Diagnosis not present

## 2013-12-06 DIAGNOSIS — F71 Moderate intellectual disabilities: Secondary | ICD-10-CM | POA: Diagnosis not present

## 2013-12-06 DIAGNOSIS — R52 Pain, unspecified: Secondary | ICD-10-CM | POA: Diagnosis not present

## 2013-12-19 DIAGNOSIS — E119 Type 2 diabetes mellitus without complications: Secondary | ICD-10-CM | POA: Diagnosis not present

## 2013-12-19 DIAGNOSIS — R112 Nausea with vomiting, unspecified: Secondary | ICD-10-CM | POA: Diagnosis not present

## 2013-12-19 DIAGNOSIS — I10 Essential (primary) hypertension: Secondary | ICD-10-CM | POA: Diagnosis not present

## 2013-12-19 DIAGNOSIS — E785 Hyperlipidemia, unspecified: Secondary | ICD-10-CM | POA: Diagnosis not present

## 2013-12-20 DIAGNOSIS — E785 Hyperlipidemia, unspecified: Secondary | ICD-10-CM | POA: Diagnosis not present

## 2013-12-20 DIAGNOSIS — E119 Type 2 diabetes mellitus without complications: Secondary | ICD-10-CM | POA: Diagnosis not present

## 2014-02-09 ENCOUNTER — Ambulatory Visit: Payer: Medicare Other | Admitting: Podiatrist

## 2014-03-06 DIAGNOSIS — Z9181 History of falling: Secondary | ICD-10-CM | POA: Diagnosis not present

## 2014-03-06 DIAGNOSIS — Z1331 Encounter for screening for depression: Secondary | ICD-10-CM | POA: Diagnosis not present

## 2014-03-06 DIAGNOSIS — Z125 Encounter for screening for malignant neoplasm of prostate: Secondary | ICD-10-CM | POA: Diagnosis not present

## 2014-03-06 DIAGNOSIS — Z0000 Encounter for general adult medical examination without abnormal findings: Secondary | ICD-10-CM | POA: Diagnosis not present

## 2014-03-07 DIAGNOSIS — Z125 Encounter for screening for malignant neoplasm of prostate: Secondary | ICD-10-CM | POA: Diagnosis not present

## 2014-03-07 DIAGNOSIS — Z1211 Encounter for screening for malignant neoplasm of colon: Secondary | ICD-10-CM | POA: Diagnosis not present

## 2014-03-07 DIAGNOSIS — E039 Hypothyroidism, unspecified: Secondary | ICD-10-CM | POA: Diagnosis not present

## 2014-03-07 DIAGNOSIS — I10 Essential (primary) hypertension: Secondary | ICD-10-CM | POA: Diagnosis not present

## 2014-03-23 ENCOUNTER — Ambulatory Visit (INDEPENDENT_AMBULATORY_CARE_PROVIDER_SITE_OTHER): Payer: Medicare Other | Admitting: Podiatry

## 2014-03-23 DIAGNOSIS — B351 Tinea unguium: Secondary | ICD-10-CM | POA: Diagnosis not present

## 2014-03-23 DIAGNOSIS — M79609 Pain in unspecified limb: Secondary | ICD-10-CM

## 2014-03-23 DIAGNOSIS — M79676 Pain in unspecified toe(s): Secondary | ICD-10-CM

## 2014-03-23 NOTE — Patient Instructions (Signed)
Diabetes and Foot Care Diabetes may cause you to have problems because of poor blood supply (circulation) to your feet and legs. This may cause the skin on your feet to become thinner, break easier, and heal more slowly. Your skin may become dry, and the skin may peel and crack. You may also have nerve damage in your legs and feet causing decreased feeling in them. You may not notice minor injuries to your feet that could lead to infections or more serious problems. Taking care of your feet is one of the most important things you can do for yourself.  HOME CARE INSTRUCTIONS  Wear shoes at all times, even in the house. Do not go barefoot. Bare feet are easily injured.  Check your feet daily for blisters, cuts, and redness. If you cannot see the bottom of your feet, use a mirror or ask someone for help.  Wash your feet with warm water (do not use hot water) and mild soap. Then pat your feet and the areas between your toes until they are completely dry. Do not soak your feet as this can dry your skin.  Apply a moisturizing lotion or petroleum jelly (that does not contain alcohol and is unscented) to the skin on your feet and to dry, brittle toenails. Do not apply lotion between your toes.  Trim your toenails straight across. Do not dig under them or around the cuticle. File the edges of your nails with an emery board or nail file.  Do not cut corns or calluses or try to remove them with medicine.  Wear clean socks or stockings every day. Make sure they are not too tight. Do not wear knee-high stockings since they may decrease blood flow to your legs.  Wear shoes that fit properly and have enough cushioning. To break in new shoes, wear them for just a few hours a day. This prevents you from injuring your feet. Always look in your shoes before you put them on to be sure there are no objects inside.  Do not cross your legs. This may decrease the blood flow to your feet.  If you find a minor scrape,  cut, or break in the skin on your feet, keep it and the skin around it clean and dry. These areas may be cleansed with mild soap and water. Do not cleanse the area with peroxide, alcohol, or iodine.  When you remove an adhesive bandage, be sure not to damage the skin around it.  If you have a wound, look at it several times a day to make sure it is healing.  Do not use heating pads or hot water bottles. They may burn your skin. If you have lost feeling in your feet or legs, you may not know it is happening until it is too late.  Make sure your health care provider performs a complete foot exam at least annually or more often if you have foot problems. Report any cuts, sores, or bruises to your health care provider immediately. SEEK MEDICAL CARE IF:   You have an injury that is not healing.  You have cuts or breaks in the skin.  You have an ingrown nail.  You notice redness on your legs or feet.  You feel burning or tingling in your legs or feet.  You have pain or cramps in your legs and feet.  Your legs or feet are numb.  Your feet always feel cold. SEEK IMMEDIATE MEDICAL CARE IF:   There is increasing redness,   swelling, or pain in or around a wound.  There is a red line that goes up your leg.  Pus is coming from a wound.  You develop a fever or as directed by your health care provider.  You notice a bad smell coming from an ulcer or wound. Document Released: 07/18/2000 Document Revised: 03/23/2013 Document Reviewed: 12/28/2012 [employer] Patient Information 2015 [employer]. This information is not intended to replace advice given to you by your health care provider. Make sure you discuss any questions you have with your health care provider.  

## 2014-03-23 NOTE — Progress Notes (Signed)
Patient ID: Wayne Jones, male   DOB: Aug 11, 1960, 53 y.o.   MRN: [id number]  53 year old male presents the office today for diabetic foot exam and painful nails. He worse with ambulation and certain shoe gear. No new complaints today.  Objective: Awake and NAD. DP/PT pulses palpable b/l 2/4, CRT < 3 sec. Nail exam reveals thickened, dystrophic, discolored, brittle, yellow-brown discoloration, pain to the nails x10. Decrease in protective sensation with a Semmes Weinstein monofilament.   Assessment: 50 -year-old diabetic male with symptomatic onychomycosis  Plan: Discussed the importance of daily foot inspection. Nail sharply debrided D66 without complication. Monitor for any changes. Return in 3 months or sooner if any problems are to arise.

## 2014-03-28 DIAGNOSIS — N35919 Unspecified urethral stricture, male, unspecified site: Secondary | ICD-10-CM | POA: Diagnosis not present

## 2014-03-28 DIAGNOSIS — R339 Retention of urine, unspecified: Secondary | ICD-10-CM | POA: Diagnosis not present

## 2014-03-28 DIAGNOSIS — Z79899 Other long term (current) drug therapy: Secondary | ICD-10-CM | POA: Diagnosis not present

## 2014-03-28 DIAGNOSIS — N139 Obstructive and reflux uropathy, unspecified: Secondary | ICD-10-CM | POA: Diagnosis not present

## 2014-03-28 DIAGNOSIS — IMO0002 Reserved for concepts with insufficient information to code with codable children: Secondary | ICD-10-CM | POA: Diagnosis not present

## 2014-03-28 DIAGNOSIS — N138 Other obstructive and reflux uropathy: Secondary | ICD-10-CM | POA: Diagnosis not present

## 2014-03-28 DIAGNOSIS — F79 Unspecified intellectual disabilities: Secondary | ICD-10-CM | POA: Diagnosis not present

## 2014-03-28 DIAGNOSIS — E039 Hypothyroidism, unspecified: Secondary | ICD-10-CM | POA: Diagnosis not present

## 2014-03-28 DIAGNOSIS — N401 Enlarged prostate with lower urinary tract symptoms: Secondary | ICD-10-CM | POA: Diagnosis not present

## 2014-03-28 DIAGNOSIS — R300 Dysuria: Secondary | ICD-10-CM | POA: Diagnosis not present

## 2014-03-28 DIAGNOSIS — F319 Bipolar disorder, unspecified: Secondary | ICD-10-CM | POA: Diagnosis not present

## 2014-03-28 DIAGNOSIS — E785 Hyperlipidemia, unspecified: Secondary | ICD-10-CM | POA: Diagnosis not present

## 2014-03-28 DIAGNOSIS — I10 Essential (primary) hypertension: Secondary | ICD-10-CM | POA: Diagnosis not present

## 2014-04-24 DIAGNOSIS — Z23 Encounter for immunization: Secondary | ICD-10-CM | POA: Diagnosis not present

## 2014-04-24 DIAGNOSIS — N181 Chronic kidney disease, stage 1: Secondary | ICD-10-CM | POA: Diagnosis not present

## 2014-04-24 DIAGNOSIS — E1129 Type 2 diabetes mellitus with other diabetic kidney complication: Secondary | ICD-10-CM | POA: Diagnosis not present

## 2014-04-24 DIAGNOSIS — E785 Hyperlipidemia, unspecified: Secondary | ICD-10-CM | POA: Diagnosis not present

## 2014-04-24 DIAGNOSIS — L0291 Cutaneous abscess, unspecified: Secondary | ICD-10-CM | POA: Diagnosis not present

## 2014-04-24 DIAGNOSIS — I10 Essential (primary) hypertension: Secondary | ICD-10-CM | POA: Diagnosis not present

## 2014-04-24 DIAGNOSIS — E669 Obesity, unspecified: Secondary | ICD-10-CM | POA: Diagnosis not present

## 2014-04-24 DIAGNOSIS — L0390 Cellulitis, unspecified: Secondary | ICD-10-CM | POA: Diagnosis not present

## 2014-06-06 DIAGNOSIS — F71 Moderate intellectual disabilities: Secondary | ICD-10-CM | POA: Diagnosis not present

## 2014-06-22 ENCOUNTER — Ambulatory Visit: Payer: Medicare Other | Admitting: Podiatry

## 2014-08-07 DIAGNOSIS — E119 Type 2 diabetes mellitus without complications: Secondary | ICD-10-CM | POA: Diagnosis not present

## 2014-09-06 DIAGNOSIS — E785 Hyperlipidemia, unspecified: Secondary | ICD-10-CM | POA: Diagnosis not present

## 2014-09-06 DIAGNOSIS — E039 Hypothyroidism, unspecified: Secondary | ICD-10-CM | POA: Diagnosis not present

## 2014-09-06 DIAGNOSIS — I10 Essential (primary) hypertension: Secondary | ICD-10-CM | POA: Diagnosis not present

## 2014-09-06 DIAGNOSIS — E119 Type 2 diabetes mellitus without complications: Secondary | ICD-10-CM | POA: Diagnosis not present

## 2014-09-06 DIAGNOSIS — R4189 Other symptoms and signs involving cognitive functions and awareness: Secondary | ICD-10-CM | POA: Diagnosis not present

## 2014-09-06 LAB — HEMOGLOBIN A1C: Hgb A1c MFr Bld: 5.4 % (ref 4.0–6.0)

## 2014-09-08 DIAGNOSIS — I10 Essential (primary) hypertension: Secondary | ICD-10-CM | POA: Diagnosis not present

## 2014-09-08 DIAGNOSIS — E039 Hypothyroidism, unspecified: Secondary | ICD-10-CM | POA: Diagnosis not present

## 2014-09-08 DIAGNOSIS — E785 Hyperlipidemia, unspecified: Secondary | ICD-10-CM | POA: Diagnosis not present

## 2014-09-08 LAB — LIPID PANEL
Cholesterol: 117 mg/dL (ref 0–200)
HDL: 47 mg/dL (ref 35–70)
LDL CALC: 50 mg/dL
TRIGLYCERIDES: 99 mg/dL (ref 40–160)

## 2014-09-21 ENCOUNTER — Ambulatory Visit: Payer: Medicare Other | Admitting: Podiatry

## 2014-09-26 ENCOUNTER — Ambulatory Visit (INDEPENDENT_AMBULATORY_CARE_PROVIDER_SITE_OTHER): Payer: Medicare Other | Admitting: Podiatry

## 2014-09-26 ENCOUNTER — Encounter: Payer: Self-pay | Admitting: Podiatry

## 2014-09-26 VITALS — BP 118/73 | HR 56 | Resp 18

## 2014-09-26 DIAGNOSIS — M79676 Pain in unspecified toe(s): Secondary | ICD-10-CM | POA: Diagnosis not present

## 2014-09-26 DIAGNOSIS — B351 Tinea unguium: Secondary | ICD-10-CM

## 2014-09-27 ENCOUNTER — Encounter: Payer: Self-pay | Admitting: Podiatry

## 2014-09-27 NOTE — Progress Notes (Signed)
Patient ID: Wayne Jones, male   DOB: Feb 20, 1961, 54 y.o.   MRN: [id number]  Subjective: 54 year old male returns the office today for painful, elongated, thickened toenails. Denies any redness or drainage around the nails. Denies any acute changes since last appointment and no new complaints today. Denies any systemic complaints such as fevers, chills, nausea, vomiting.   Objective: Awake, alert,  NAD DP/PT pulses palpable, CRT less than 3 seconds Protective sensation intact with Simms Weinstein monofilament, Achilles tendon reflex intact.  Nails hypertrophic, dystrophic, elongated, brittle, discolored 10. There is tenderness overlying the nails 1-5 bilaterally. There is no surrounding erythema or drainage along the nail sites. No open lesions or pre-ulcerative lesions are identified. No other areas of tenderness bilateral lower extremities. No overlying edema, erythema, increased warmth. No pain with calf compression, swelling, warmth, erythema.  Assessment: Patient presents with symptomatic onychomycosis  Plan: -Treatment options including alternatives, risks, complications were discussed -Nails sharply debrided 10 without complication/bleeding. -Discussed daily foot inspection. If there are any changes, to call the office immediately.  -Follow-up in 3 months or sooner if any problems are to arise. In the meantime, encouraged to call the office with any questions, concerns, changes symptoms.

## 2014-10-19 ENCOUNTER — Emergency Department: Payer: Self-pay | Admitting: Internal Medicine

## 2014-10-19 DIAGNOSIS — N508 Other specified disorders of male genital organs: Secondary | ICD-10-CM | POA: Diagnosis not present

## 2014-10-19 DIAGNOSIS — N390 Urinary tract infection, site not specified: Secondary | ICD-10-CM | POA: Diagnosis not present

## 2014-10-19 DIAGNOSIS — I10 Essential (primary) hypertension: Secondary | ICD-10-CM | POA: Diagnosis not present

## 2014-10-19 DIAGNOSIS — B9689 Other specified bacterial agents as the cause of diseases classified elsewhere: Secondary | ICD-10-CM | POA: Diagnosis not present

## 2014-10-24 DIAGNOSIS — R4189 Other symptoms and signs involving cognitive functions and awareness: Secondary | ICD-10-CM | POA: Diagnosis not present

## 2014-11-21 ENCOUNTER — Encounter: Payer: Self-pay | Admitting: Family Medicine

## 2014-12-04 DIAGNOSIS — Z79899 Other long term (current) drug therapy: Secondary | ICD-10-CM | POA: Diagnosis not present

## 2014-12-05 DIAGNOSIS — F71 Moderate intellectual disabilities: Secondary | ICD-10-CM | POA: Diagnosis not present

## 2014-12-13 DIAGNOSIS — R300 Dysuria: Secondary | ICD-10-CM | POA: Diagnosis not present

## 2014-12-26 ENCOUNTER — Ambulatory Visit: Payer: Medicare Other

## 2015-01-09 ENCOUNTER — Encounter: Payer: Self-pay | Admitting: Family Medicine

## 2015-01-09 ENCOUNTER — Ambulatory Visit (INDEPENDENT_AMBULATORY_CARE_PROVIDER_SITE_OTHER): Payer: Medicare Other | Admitting: Family Medicine

## 2015-01-09 VITALS — BP 118/78 | HR 57 | Temp 97.4°F | Resp 16 | Ht 72.0 in | Wt 268.4 lb

## 2015-01-09 DIAGNOSIS — R4189 Other symptoms and signs involving cognitive functions and awareness: Secondary | ICD-10-CM | POA: Insufficient documentation

## 2015-01-09 DIAGNOSIS — E119 Type 2 diabetes mellitus without complications: Secondary | ICD-10-CM | POA: Diagnosis not present

## 2015-01-09 DIAGNOSIS — G4733 Obstructive sleep apnea (adult) (pediatric): Secondary | ICD-10-CM | POA: Insufficient documentation

## 2015-01-09 DIAGNOSIS — N401 Enlarged prostate with lower urinary tract symptoms: Secondary | ICD-10-CM | POA: Diagnosis not present

## 2015-01-09 DIAGNOSIS — R809 Proteinuria, unspecified: Secondary | ICD-10-CM

## 2015-01-09 DIAGNOSIS — F3012 Manic episode without psychotic symptoms, moderate: Secondary | ICD-10-CM | POA: Diagnosis not present

## 2015-01-09 DIAGNOSIS — N138 Other obstructive and reflux uropathy: Secondary | ICD-10-CM

## 2015-01-09 DIAGNOSIS — N181 Chronic kidney disease, stage 1: Secondary | ICD-10-CM | POA: Insufficient documentation

## 2015-01-09 DIAGNOSIS — N400 Enlarged prostate without lower urinary tract symptoms: Secondary | ICD-10-CM | POA: Insufficient documentation

## 2015-01-09 DIAGNOSIS — H9190 Unspecified hearing loss, unspecified ear: Secondary | ICD-10-CM | POA: Insufficient documentation

## 2015-01-09 DIAGNOSIS — E785 Hyperlipidemia, unspecified: Secondary | ICD-10-CM | POA: Insufficient documentation

## 2015-01-09 DIAGNOSIS — I10 Essential (primary) hypertension: Secondary | ICD-10-CM | POA: Diagnosis not present

## 2015-01-09 DIAGNOSIS — K439 Ventral hernia without obstruction or gangrene: Secondary | ICD-10-CM | POA: Insufficient documentation

## 2015-01-09 DIAGNOSIS — IMO0002 Reserved for concepts with insufficient information to code with codable children: Secondary | ICD-10-CM | POA: Insufficient documentation

## 2015-01-09 DIAGNOSIS — E668 Other obesity: Secondary | ICD-10-CM

## 2015-01-09 DIAGNOSIS — E1129 Type 2 diabetes mellitus with other diabetic kidney complication: Secondary | ICD-10-CM | POA: Insufficient documentation

## 2015-01-09 DIAGNOSIS — K219 Gastro-esophageal reflux disease without esophagitis: Secondary | ICD-10-CM | POA: Insufficient documentation

## 2015-01-09 DIAGNOSIS — Z8639 Personal history of other endocrine, nutritional and metabolic disease: Secondary | ICD-10-CM | POA: Insufficient documentation

## 2015-01-09 LAB — POCT GLYCOSYLATED HEMOGLOBIN (HGB A1C): Hemoglobin A1C: 5.3

## 2015-01-09 NOTE — Progress Notes (Signed)
Name: Wayne Jones   MRN: [id number]    DOB: 16-Apr-1961   Date:01/09/2015       Progress Note  Subjective  Chief Complaint  Chief Complaint  Patient presents with  . Hypertension  . Hypothyroidism  . Hyperlipidemia  . Depression  . Diabetes    Hypertension This is a chronic problem. The current episode started more than 1 year ago. Associated symptoms include anxiety and peripheral edema. Pertinent negatives include no blurred vision, chest pain, headaches, neck pain, orthopnea, palpitations or shortness of breath. There are no associated agents to hypertension. Risk factors for coronary artery disease include dyslipidemia, diabetes mellitus, obesity and sedentary lifestyle. Past treatments include diuretics. The current treatment provides mild improvement. There are no compliance problems.  Hypertensive end-organ damage includes a thyroid problem. There is no history of angina or CVA.  Hyperlipidemia This is a chronic problem. The current episode started more than 1 year ago. The problem is resistant. Recent lipid tests were reviewed and are normal. Exacerbating diseases include diabetes and hypothyroidism. Factors aggravating his hyperlipidemia include fatty foods. Pertinent negatives include no chest pain, focal weakness, myalgias or shortness of breath. Current antihyperlipidemic treatment includes statins. The current treatment provides significant improvement of lipids. There are no compliance problems.  Risk factors for coronary artery disease include diabetes mellitus, dyslipidemia, obesity and a sedentary lifestyle.  Diabetes He presents for his follow-up diabetic visit. He has type 2 diabetes mellitus. Pertinent negatives for hypoglycemia include no dizziness, headaches, nervousness/anxiousness, seizures or tremors. Pertinent negatives for diabetes include no blurred vision, no chest pain, no visual change, no weakness and no weight loss. Symptoms are stable. Pertinent negatives for  diabetic complications include no CVA. There are no known risk factors for coronary artery disease. Current diabetic treatment includes diet. He is compliant with treatment some of the time. He is following a diabetic diet. He participates in exercise daily. There is no change in his home blood glucose trend. An ACE inhibitor/angiotensin II receptor blocker is not being taken. He sees a podiatrist.Eye exam is current.  Thyroid Problem Presents for follow-up visit. Symptoms include weight gain. Patient reports no anxiety, constipation, diarrhea, palpitations, tremors, visual change or weight loss. The symptoms have been stable. Past treatments include levothyroxine. The treatment provided significant relief. His past medical history is significant for diabetes and hyperlipidemia.   COGNITIVE IMPAIRMENT  Lives in group home setting.  Has regular psychiatrist.  No recent behavioral issues.     No past medical history on file.  History  Substance Use Topics  . Smoking status: Never Smoker   . Smokeless tobacco: Never Used  . Alcohol Use: No     Current outpatient prescriptions:  .  ARIPiprazole (ABILIFY) 20 MG tablet, Take 20 mg by mouth., Disp: , Rfl:  .  aspirin EC 81 MG tablet, Take 81 mg by mouth., Disp: , Rfl:  .  atorvastatin (LIPITOR) 10 MG tablet, Take 10 mg by mouth., Disp: , Rfl:  .  carbamazepine (EQUETRO) 200 MG CP12 12 hr capsule, Take 200 mg by mouth., Disp: , Rfl:  .  clonazePAM (KLONOPIN) 1 MG tablet, Take 1 mg by mouth., Disp: , Rfl:  .  finasteride (PROSCAR) 5 MG tablet, Take 5 mg by mouth., Disp: , Rfl:  .  FLUoxetine (PROZAC) 20 MG capsule, Take 20 mg by mouth., Disp: , Rfl:  .  gabapentin (NEURONTIN) 600 MG tablet, Take 600 mg by mouth., Disp: , Rfl:  .  loratadine (CLARITIN) 10 MG  tablet, Take 10 mg by mouth., Disp: , Rfl:  .  omega-3 acid ethyl esters (LOVAZA) 1 G capsule, Take by mouth., Disp: , Rfl:  .  Omeprazole 20 MG TBEC, Take 20 mg by mouth., Disp: , Rfl:  .   potassium chloride SA (K-DUR,KLOR-CON) 20 MEQ tablet, Take by mouth., Disp: , Rfl:  .  propranolol (INDERAL) 60 MG tablet, Take 60 mg by mouth., Disp: , Rfl:  .  tamsulosin (FLOMAX) 0.4 MG CAPS capsule, Take 0.4 mg by mouth., Disp: , Rfl:  .  torsemide (DEMADEX) 20 MG tablet, Take 20 mg by mouth., Disp: , Rfl:  .  traMADol (ULTRAM) 50 MG tablet, Take 50 mg by mouth., Disp: , Rfl:  .  ARIPiprazole (ABILIFY) 20 MG tablet, Take 20 mg by mouth daily., Disp: , Rfl:  .  aspirin 81 MG EC tablet, Take 81 mg by mouth daily. Swallow whole., Disp: , Rfl:  .  atorvastatin (LIPITOR) 10 MG tablet, Take 10 mg by mouth daily., Disp: , Rfl:  .  carbamazepine (CARBATROL) 200 MG 12 hr capsule, Take by mouth., Disp: , Rfl:  .  carbamazepine (EQUETRO) 200 MG CP12 12 hr capsule, Take 200 mg by mouth., Disp: , Rfl:  .  carbamide peroxide (DEBROX) 6.5 % otic solution, 5 drops daily., Disp: , Rfl:  .  carbamide peroxide (DEBROX) 6.5 % otic solution, 5 drops daily., Disp: , Rfl:  .  clonazePAM (KLONOPIN) 1 MG tablet, Take 1 mg by mouth daily., Disp: , Rfl:  .  Ergocalciferol (VITAMIN D2 PO), Take 1.25 mg by mouth. Take 2 by mouth monthly, Disp: , Rfl:  .  finasteride (PROSCAR) 5 MG tablet, Take 5 mg by mouth daily., Disp: , Rfl:  .  FLUoxetine (PROZAC) 20 MG tablet, Take 20 mg by mouth daily., Disp: , Rfl:  .  gabapentin (NEURONTIN) 600 MG tablet, Take 600 mg by mouth at bedtime., Disp: , Rfl:  .  loratadine (CLARITIN) 10 MG tablet, Take 10 mg by mouth daily., Disp: , Rfl:  .  Loratadine 10 MG CAPS, Take by mouth., Disp: , Rfl:  .  metFORMIN (GLUCOPHAGE) 500 MG tablet, Take by mouth daily with breakfast., Disp: , Rfl:  .  omega-3 acid ethyl esters (LOVAZA) 1 G capsule, Take 1 g by mouth 2 (two) times daily., Disp: , Rfl:  .  omeprazole (PRILOSEC) 20 MG capsule, Take 20 mg by mouth daily., Disp: , Rfl:  .  ondansetron (ZOFRAN) 4 MG tablet, Take 4 mg by mouth., Disp: , Rfl:  .  potassium chloride SA (K-DUR,KLOR-CON) 20  MEQ tablet, Take 20 mEq by mouth 2 (two) times daily., Disp: , Rfl:  .  propranolol (INDERAL) 60 MG tablet, Take 60 mg by mouth 2 (two) times daily., Disp: , Rfl:  .  ranitidine (ZANTAC) 150 MG tablet, Take 150 mg by mouth 2 (two) times daily., Disp: , Rfl:  .  ranitidine (ZANTAC) 150 MG tablet, Take 150 mg by mouth., Disp: , Rfl:  .  tamsulosin (FLOMAX) 0.4 MG CAPS capsule, Take 0.4 mg by mouth., Disp: , Rfl:  .  torsemide (DEMADEX) 20 MG tablet, Take 20 mg by mouth daily., Disp: , Rfl:   No Known Allergies  Review of Systems  Constitutional: Positive for weight gain. Negative for fever, chills and weight loss.  HENT: Negative for congestion, hearing loss, sore throat and tinnitus.   Eyes: Negative for blurred vision, double vision and redness.  Respiratory: Negative for cough, hemoptysis and shortness  of breath.   Cardiovascular: Negative for chest pain, palpitations, orthopnea, claudication and leg swelling.  Gastrointestinal: Negative for heartburn, nausea, vomiting, diarrhea, constipation and blood in stool.  Genitourinary: Negative for dysuria, urgency, frequency and hematuria.  Musculoskeletal: Negative for myalgias, back pain, joint pain, falls and neck pain.  Skin: Negative for itching.  Neurological: Negative for dizziness, tingling, tremors, focal weakness, seizures, loss of consciousness, weakness and headaches.  Endo/Heme/Allergies: Does not bruise/bleed easily.  Psychiatric/Behavioral: Negative for depression and substance abuse. The patient is not nervous/anxious and does not have insomnia.        Cognitive impairment     Objective  Filed Vitals:   01/09/15 1019  BP: 118/78  Pulse: 57  Temp: 97.4 F (36.3 C)  Resp: 16  Height: 6' (1.829 m)  Weight: 268 lb 7 oz (121.762 kg)  SpO2: 98%     Physical Exam  Constitutional: He is oriented to person, place, and time and well-developed, well-nourished, and in no distress.  HENT:  Head: Normocephalic.  Eyes: EOM are  normal. Pupils are equal, round, and reactive to light.  Neck: Normal range of motion. Neck supple. No thyromegaly present.  Cardiovascular: Normal rate, regular rhythm and normal heart sounds.   No murmur heard. Pulmonary/Chest: Effort normal and breath sounds normal. No respiratory distress. He has no wheezes.  Abdominal: Soft. Bowel sounds are normal.  Musculoskeletal: Normal range of motion. He exhibits no edema.  Lymphadenopathy:    He has no cervical adenopathy.  Neurological: He is alert and oriented to person, place, and time. No cranial nerve deficit. Gait normal. Coordination normal.  Skin: Skin is warm and dry. No rash noted.  Psychiatric: Affect and judgment normal.      Assessment & Plan  1. Well controlled diabetes mellitus   2. Benign essential HTN controlled  3. Chronic kidney disease (CKD), stage I proteinuria  4. HLD (hyperlipidemia) controlled  5. Benign prostatic hyperplasia with urinary obstruction stable  6. Gastroesophageal reflux disease without esophagitis stable  7. Bipolar I disorder, single manic episode, moderate stable  8. Extreme obesity

## 2015-01-09 NOTE — Patient Instructions (Signed)
Diabetes Mellitus and Food It is important for you to manage your blood sugar (glucose) level. Your blood glucose level can be greatly affected by what you eat. Eating healthier foods in the appropriate amounts throughout the day at about the same time each day will help you control your blood glucose level. It can also help slow or prevent worsening of your diabetes mellitus. Healthy eating may even help you improve the level of your blood pressure and reach or maintain a healthy weight.  HOW CAN FOOD AFFECT ME? Carbohydrates Carbohydrates affect your blood glucose level more than any other type of food. Your dietitian will help you determine how many carbohydrates to eat at each meal and teach you how to count carbohydrates. Counting carbohydrates is important to keep your blood glucose at a healthy level, especially if you are using insulin or taking certain medicines for diabetes mellitus. Alcohol Alcohol can cause sudden decreases in blood glucose (hypoglycemia), especially if you use insulin or take certain medicines for diabetes mellitus. Hypoglycemia can be a life-threatening condition. Symptoms of hypoglycemia (sleepiness, dizziness, and disorientation) are similar to symptoms of having too much alcohol.  If your health care provider has given you approval to drink alcohol, do so in moderation and use the following guidelines:  Women should not have more than one drink per day, and men should not have more than two drinks per day. One drink is equal to:  12 oz of beer.  5 oz of wine.  1 oz of hard liquor.  Do not drink on an empty stomach.  Keep yourself hydrated. Have water, diet soda, or unsweetened iced tea.  Regular soda, juice, and other mixers might contain a lot of carbohydrates and should be counted. WHAT FOODS ARE NOT RECOMMENDED? As you make food choices, it is important to remember that all foods are not the same. Some foods have fewer nutrients per serving than other  foods, even though they might have the same number of calories or carbohydrates. It is difficult to get your body what it needs when you eat foods with fewer nutrients. Examples of foods that you should avoid that are high in calories and carbohydrates but low in nutrients include:  Trans fats (most processed foods list trans fats on the Nutrition Facts label).  Regular soda.  Juice.  Candy.  Sweets, such as cake, pie, doughnuts, and cookies.  Fried foods. WHAT FOODS CAN I EAT? Have nutrient-rich foods, which will nourish your body and keep you healthy. The food you should eat also will depend on several factors, including:  The calories you need.  The medicines you take.  Your weight.  Your blood glucose level.  Your blood pressure level.  Your cholesterol level. You also should eat a variety of foods, including:  Protein, such as meat, poultry, fish, tofu, nuts, and seeds (lean animal proteins are best).  Fruits.  Vegetables.  Dairy products, such as milk, cheese, and yogurt (low fat is best).  Breads, grains, pasta, cereal, rice, and beans.  Fats such as olive oil, trans fat-free margarine, canola oil, avocado, and olives. DOES EVERYONE WITH DIABETES MELLITUS HAVE THE SAME MEAL PLAN? Because every person with diabetes mellitus is different, there is not one meal plan that works for everyone. It is very important that you meet with a dietitian who will help you create a meal plan that is just right for you. Document Released: 04/17/2005 Document Revised: 07/26/2013 Document Reviewed: 06/17/2013 [employer] Patient Information 2015 [employer]. This   information is not intended to replace advice given to you by your health care provider. Make sure you discuss any questions you have with your health care provider.  

## 2015-02-20 ENCOUNTER — Ambulatory Visit (INDEPENDENT_AMBULATORY_CARE_PROVIDER_SITE_OTHER): Payer: Medicare Other | Admitting: Podiatry

## 2015-02-20 ENCOUNTER — Telehealth: Payer: Self-pay | Admitting: Family Medicine

## 2015-02-20 DIAGNOSIS — M79676 Pain in unspecified toe(s): Secondary | ICD-10-CM

## 2015-02-20 DIAGNOSIS — B351 Tinea unguium: Secondary | ICD-10-CM | POA: Diagnosis not present

## 2015-02-20 NOTE — Telephone Encounter (Signed)
Pt needs refill on Phenergan and Ibuprophen. To be sent to [facility].

## 2015-02-20 NOTE — Progress Notes (Signed)
Patient ID: Wayne Jones, male   DOB: July 26, 1961, 54 y.o.   MRN: [id number]  Subjective: 54 year old male returns the office today for painful, elongated, thickened toenails which he cannot trim himself. Denies any redness or drainage around the nails. Denies any acute changes since last appointment and no new complaints today. Denies any systemic complaints such as fevers, chills, nausea, vomiting.   Objective: Awake, alert,  NAD DP/PT pulses palpable, CRT less than 3 seconds Nails hypertrophic, dystrophic, elongated, brittle, discolored 10. There is tenderness overlying the nails 1-5 bilaterally. There is no surrounding erythema or drainage along the nail sites. No open lesions or pre-ulcerative lesions are identified. No other areas of tenderness bilateral lower extremities. No overlying edema, erythema, increased warmth. No pain with calf compression, swelling, warmth, erythema.  Assessment: Patient presents with symptomatic onychomycosis  Plan: -Treatment options including alternatives, risks, complications were discussed -Nails sharply debrided 10 without complication/bleeding. -Discussed daily foot inspection. If there are any changes, to call the office immediately.  -Follow-up in 3 months or sooner if any problems are to arise. In the meantime, encouraged to call the office with any questions, concerns, changes symptoms.  Celesta Gentile, DPM

## 2015-03-03 IMAGING — CR DG ABDOMEN 2V
1 series · 4 of 4 positions shown · non-contrast
Comparison: none

REASON FOR EXAM: ABD PAIN, VOMITING
COMMENTS:

[Series 1: w abdomen upright · 0.14mm/px · 4 of 4 slices shown]
[im 1/4]
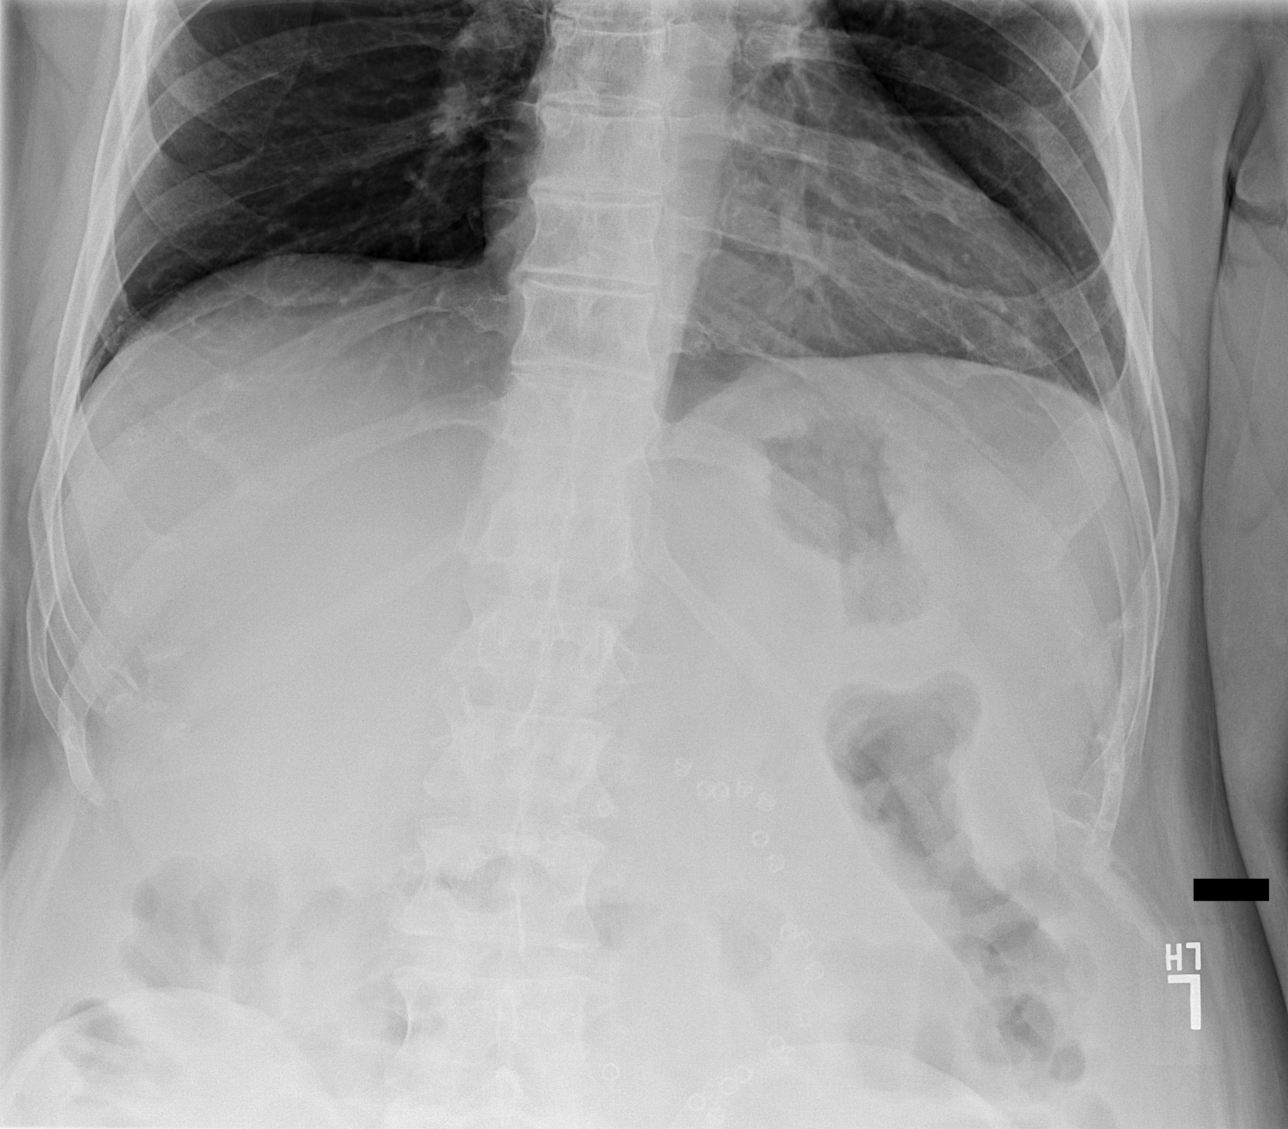
[im 2/4]
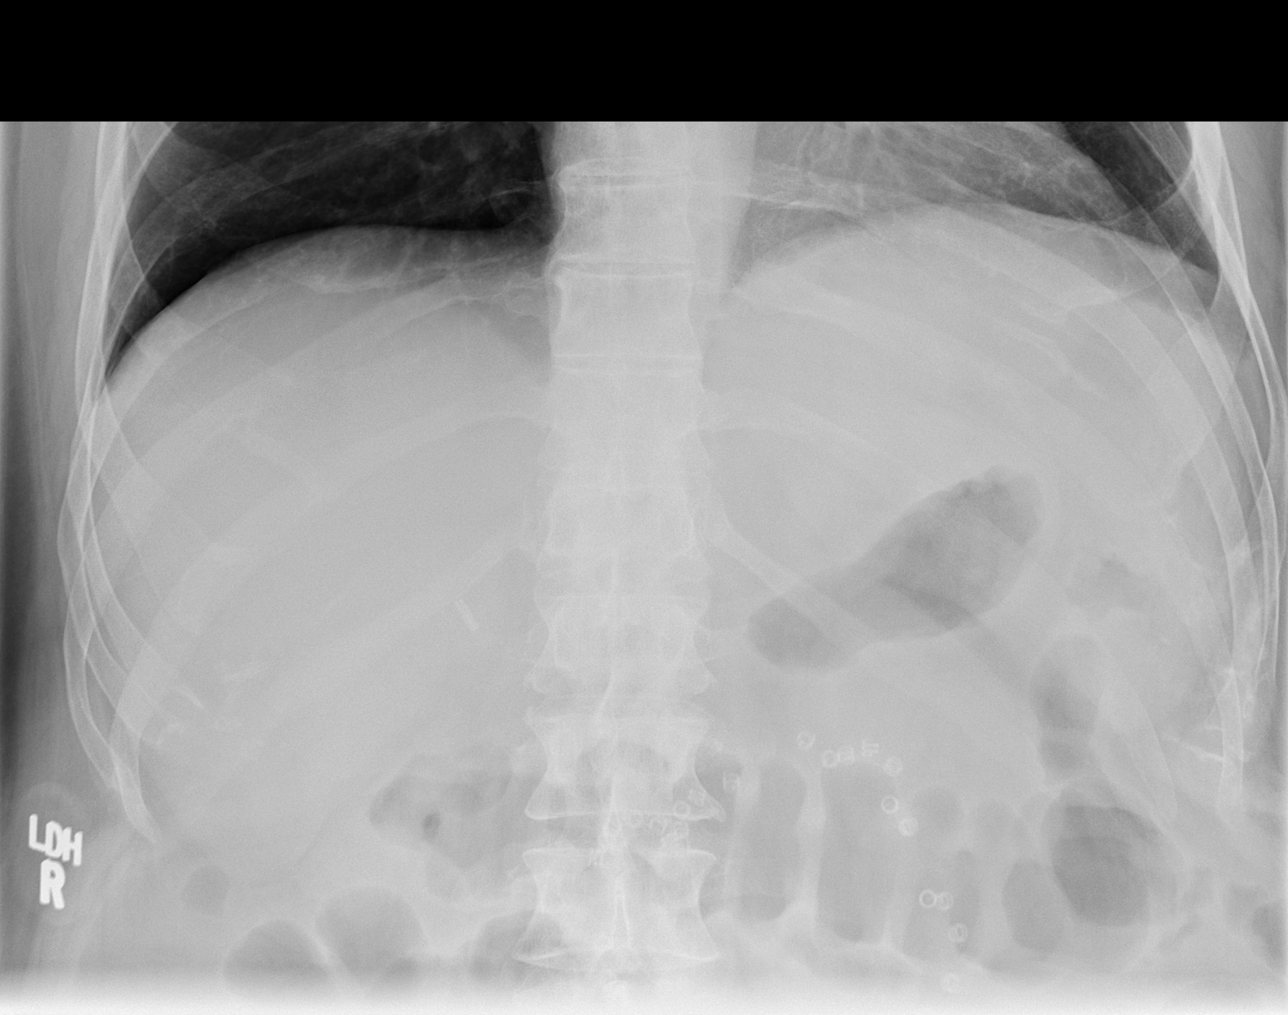
[im 3/4]
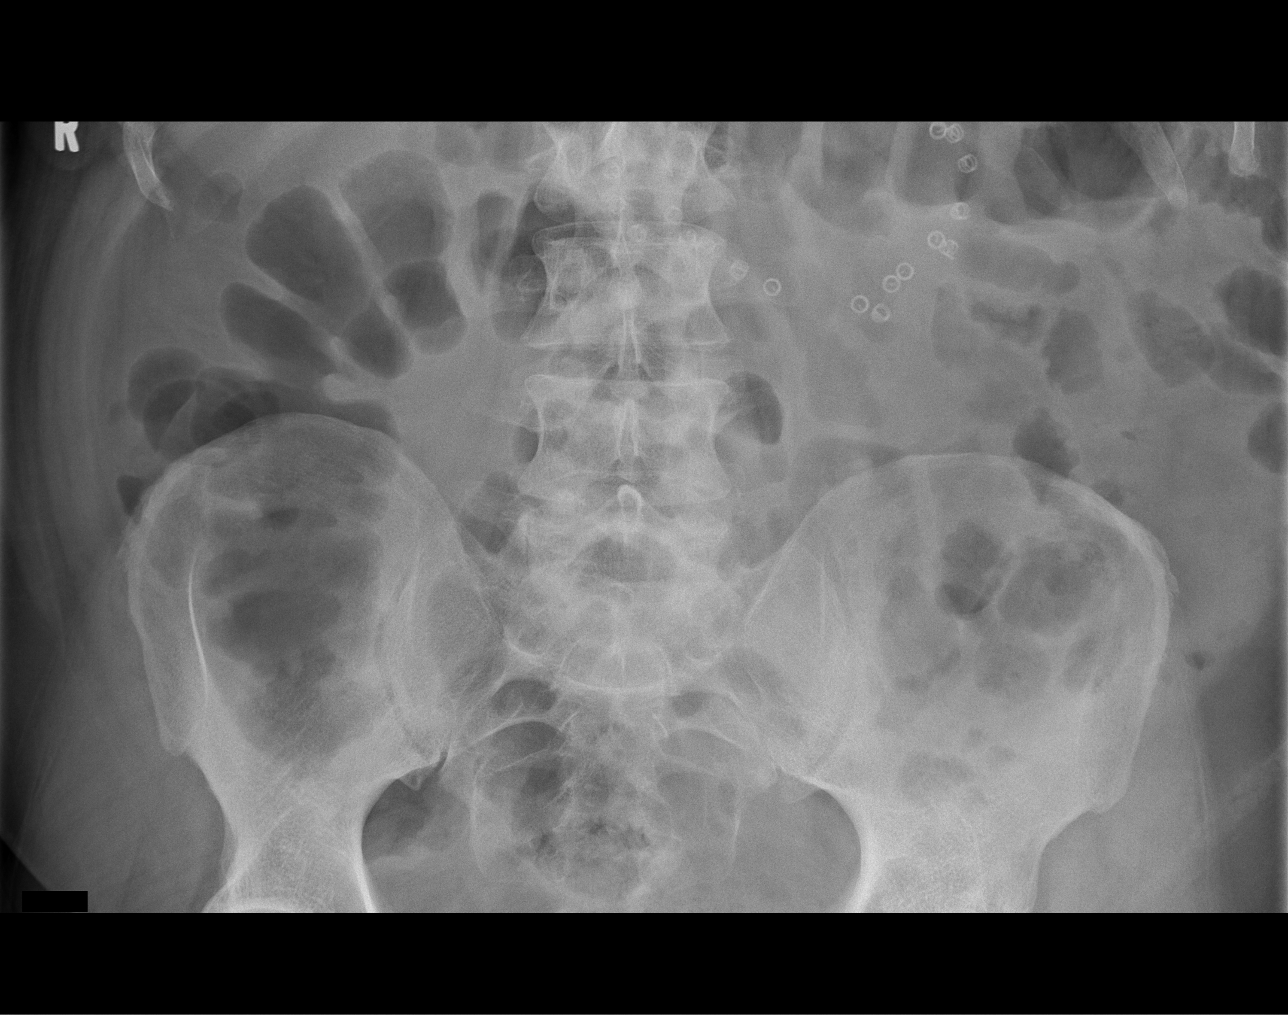
[im 4/4]
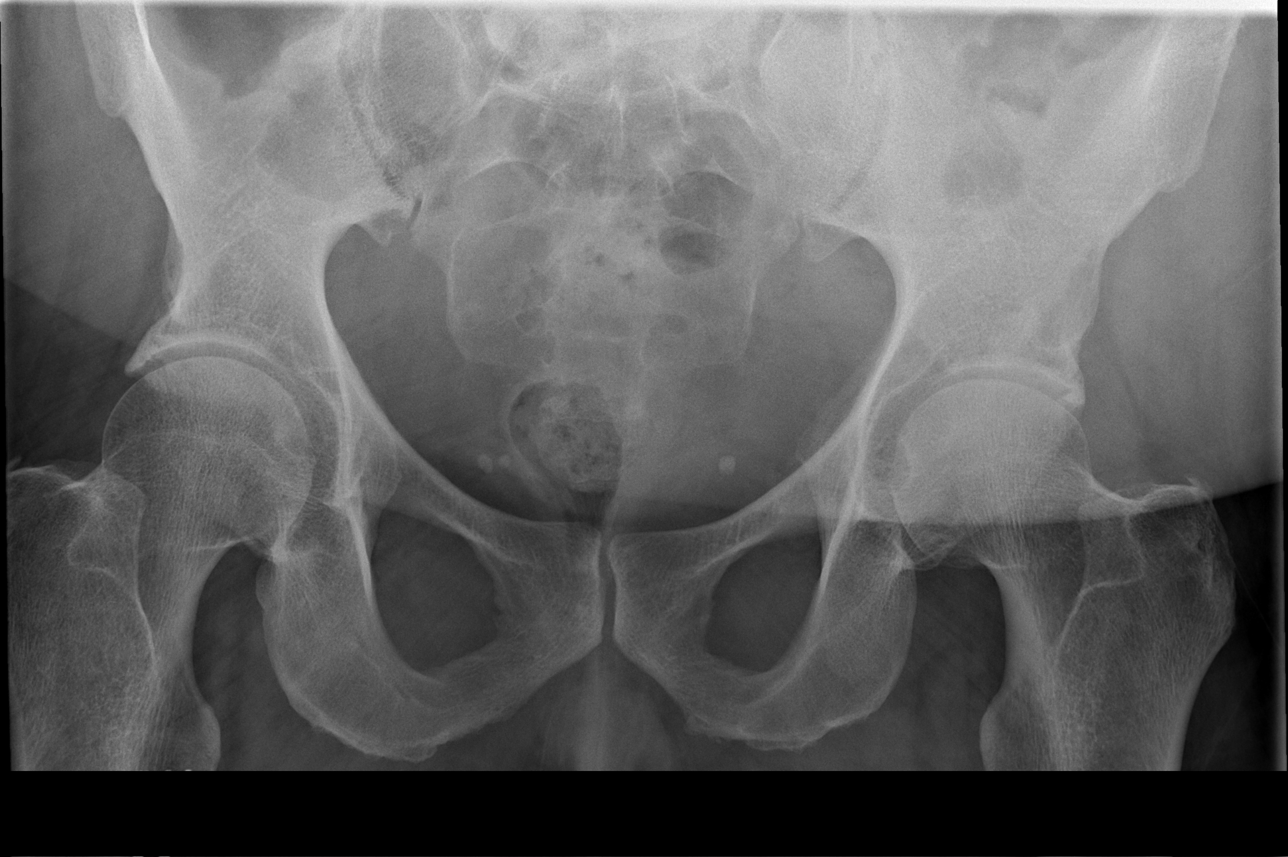

[4 of 4 positions shown; findings below may reference images not displayed]

PROCEDURE:     DXR - DXR ABDOMEN 2 V FLAT AND ERECT  - April 10, 2013  [DATE]

RESULT:     The lung bases appear clear. There is air scattered through the
colon to the rectum. There is no evidence of abnormal colonic or small bowel
distention. The bony structures appear unremarkable. Coils are present in
the left mid abdomen from previous surgery. Cholecystectomy clips are
present.
IMPRESSION: 1. No evidence of bowel obstruction or perforation.

[REDACTED]

## 2015-03-14 ENCOUNTER — Ambulatory Visit (INDEPENDENT_AMBULATORY_CARE_PROVIDER_SITE_OTHER): Payer: Medicare Other | Admitting: Family Medicine

## 2015-03-14 ENCOUNTER — Encounter: Payer: Self-pay | Admitting: Family Medicine

## 2015-03-14 VITALS — BP 118/74 | HR 63 | Temp 98.4°F | Resp 18 | Ht 72.0 in | Wt 273.8 lb

## 2015-03-14 DIAGNOSIS — Z0000 Encounter for general adult medical examination without abnormal findings: Secondary | ICD-10-CM

## 2015-03-14 DIAGNOSIS — Z23 Encounter for immunization: Secondary | ICD-10-CM

## 2015-03-14 NOTE — Progress Notes (Signed)
Name: Wayne Jones   MRN: [id number]    DOB: May 08, 1961   Date:03/14/2015       Progress Note  Subjective  Chief Complaint  Chief Complaint  Patient presents with  . Annual Exam    FL-2 forms filled out    HPI  Patient is a group home patient here for [facility]  Depression screen PHQ 2/9 03/14/2015  Decreased Interest 0  Down, Depressed, Hopeless 1  PHQ - 2 Score 1    Fall Risk  03/14/2015 03/14/2015  Falls in the past year? No No    Fall Risk  03/14/2015  Falls in the past year? No   Functional Status Survey: Does the patient have difficulty seeing, even when wearing glasses/contacts?: No Does the patient have difficulty concentrating, remembering, or making decisions?: No Does the patient have difficulty walking or climbing stairs?: No Does the patient have difficulty dressing or bathing?: No Does the patient have difficulty doing errands alone such as visiting a doctor's office or shopping?: No       Past Medical History  Diagnosis Date  . Diabetes mellitus without complication   . Mental disorder   . Hyperlipidemia   . Hypertension   . Depression     Social History  Substance Use Topics  . Smoking status: Never Smoker   . Smokeless tobacco: Never Used  . Alcohol Use: No     Current outpatient prescriptions:  .  ARIPiprazole (ABILIFY) 20 MG tablet, Take 20 mg by mouth daily., Disp: , Rfl:  .  aspirin 81 MG tablet, Take 81 mg by mouth daily., Disp: , Rfl:  .  atorvastatin (LIPITOR) 10 MG tablet, Take 10 mg by mouth daily., Disp: , Rfl:  .  carbamazepine (EQUETRO) 200 MG CP12 12 hr capsule, Take 200 mg by mouth 2 (two) times daily., Disp: , Rfl:  .  carbamide peroxide (DEBROX) 6.5 % otic solution, 5 drops 2 (two) times daily., Disp: , Rfl:  .  chlorhexidine (PERIDEX) 0.12 % solution, Use as directed 15 mLs in the mouth or throat 2 (two) times daily., Disp: , Rfl:  .  clonazePAM (KLONOPIN) 1 MG tablet, Take 1 mg by mouth daily., Disp: , Rfl:  .   diphenhydrAMINE (SOMINEX) 25 MG tablet, Take 25 mg by mouth at bedtime as needed for sleep., Disp: , Rfl:  .  finasteride (PROSCAR) 5 MG tablet, Take 5 mg by mouth daily., Disp: , Rfl:  .  FLUoxetine (PROZAC) 20 MG capsule, Take 20 mg by mouth 2 (two) times daily., Disp: , Rfl:  .  gabapentin (NEURONTIN) 600 MG tablet, Take 600 mg by mouth at bedtime., Disp: , Rfl:  .  glucose blood test strip, 1 each by Other route as needed for other (check BS qd TRUETRACK DEVICE). Use as instructed, Disp: , Rfl:  .  guaiFENesin-dextromethorphan (ROBITUSSIN DM) 100-10 MG/5ML syrup, Take 5 mLs by mouth every 4 (four) hours as needed for cough., Disp: , Rfl:  .  ibuprofen (ADVIL,MOTRIN) 800 MG tablet, Take 800 mg by mouth every 8 (eight) hours as needed., Disp: , Rfl:  .  loratadine (CLARITIN) 10 MG tablet, Take 10 mg by mouth daily., Disp: , Rfl:  .  omega-3 acid ethyl esters (LOVAZA) 1 G capsule, Take by mouth 2 (two) times daily., Disp: , Rfl:  .  omeprazole (PRILOSEC) 20 MG capsule, Take 20 mg by mouth daily., Disp: , Rfl:  .  ondansetron (ZOFRAN) 4 MG tablet, Take 4 mg by mouth every  8 (eight) hours as needed for nausea or vomiting., Disp: , Rfl:  .  potassium chloride SA (K-DUR,KLOR-CON) 20 MEQ tablet, Take 20 mEq by mouth 2 (two) times daily., Disp: , Rfl:  .  promethazine (PHENERGAN) 25 MG tablet, Take 25 mg by mouth every 6 (six) hours as needed for nausea or vomiting., Disp: , Rfl:  .  propranolol ER (INDERAL LA) 60 MG 24 hr capsule, Take 60 mg by mouth 2 (two) times daily., Disp: , Rfl:  .  ranitidine (ZANTAC) 150 MG tablet, Take 150 mg by mouth at bedtime., Disp: , Rfl:  .  tamsulosin (FLOMAX) 0.4 MG CAPS capsule, Take 0.4 mg by mouth daily after supper., Disp: , Rfl:  .  torsemide (DEMADEX) 20 MG tablet, Take 20 mg by mouth daily., Disp: , Rfl:  .  traMADol (ULTRAM) 50 MG tablet, Take by mouth every 6 (six) hours as needed., Disp: , Rfl:   No Known Allergies  Review of Systems  Constitutional:  Negative for fever, chills and weight loss.  HENT: Negative for congestion, hearing loss, sore throat and tinnitus.   Eyes: Negative for blurred vision, double vision and redness.  Respiratory: Negative for cough, hemoptysis and shortness of breath.   Cardiovascular: Positive for leg swelling. Negative for chest pain, palpitations, orthopnea and claudication.  Gastrointestinal: Negative for heartburn, nausea, vomiting, diarrhea, constipation and blood in stool.  Genitourinary: Negative for dysuria, urgency, frequency and hematuria.       Primary urologist  Musculoskeletal: Negative for myalgias, back pain, joint pain, falls and neck pain.  Skin: Negative for itching.  Neurological: Negative for dizziness, tingling, tremors, focal weakness, seizures, loss of consciousness, weakness and headaches.  Endo/Heme/Allergies: Does not bruise/bleed easily.  Psychiatric/Behavioral: Negative for depression and substance abuse. The patient is not nervous/anxious and does not have insomnia.        Bipolar disorder     Objective  Filed Vitals:   03/14/15 1111  BP: 118/74  Pulse: 63  Temp: 98.4 F (36.9 C)  TempSrc: Oral  Resp: 18  Height: 6' (1.829 m)  Weight: 273 lb 12.8 oz (124.195 kg)  SpO2: 99%     Physical Exam    Assessment & Plan  1. Annual physical exam   2. Immunization due  - Flu Vaccine QUAD 36+ mos PF IM (Fluarix Quad PF)

## 2015-03-22 ENCOUNTER — Other Ambulatory Visit: Payer: Self-pay | Admitting: Family Medicine

## 2015-04-18 ENCOUNTER — Other Ambulatory Visit: Payer: Self-pay | Admitting: Emergency Medicine

## 2015-04-18 MED ORDER — ATORVASTATIN CALCIUM 10 MG PO TABS: 10.0000 mg | ORAL_TABLET | Freq: Once | ORAL | Status: DC

## 2015-04-18 MED ORDER — POTASSIUM CHLORIDE CRYS ER 20 MEQ PO TBCR
20.0000 meq | EXTENDED_RELEASE_TABLET | Freq: Two times a day (BID) | ORAL | Status: DC
Start: 2015-04-18 — End: 2016-04-10

## 2015-04-18 NOTE — Telephone Encounter (Signed)
Scripts approved to [employer]

## 2015-05-14 DIAGNOSIS — R1030 Lower abdominal pain, unspecified: Secondary | ICD-10-CM | POA: Diagnosis not present

## 2015-05-14 DIAGNOSIS — N4231 Prostatic intraepithelial neoplasia: Secondary | ICD-10-CM | POA: Diagnosis not present

## 2015-05-14 DIAGNOSIS — L304 Erythema intertrigo: Secondary | ICD-10-CM | POA: Diagnosis not present

## 2015-05-14 DIAGNOSIS — N99111 Postprocedural bulbous urethral stricture: Secondary | ICD-10-CM | POA: Diagnosis not present

## 2015-05-14 DIAGNOSIS — Z810 Family history of intellectual disabilities: Secondary | ICD-10-CM | POA: Diagnosis not present

## 2015-05-14 DIAGNOSIS — N400 Enlarged prostate without lower urinary tract symptoms: Secondary | ICD-10-CM | POA: Diagnosis not present

## 2015-05-14 DIAGNOSIS — Z87440 Personal history of urinary (tract) infections: Secondary | ICD-10-CM | POA: Diagnosis not present

## 2015-05-23 ENCOUNTER — Other Ambulatory Visit: Payer: Self-pay | Admitting: Family Medicine

## 2015-05-28 ENCOUNTER — Other Ambulatory Visit: Payer: Self-pay | Admitting: Family Medicine

## 2015-05-29 ENCOUNTER — Ambulatory Visit (INDEPENDENT_AMBULATORY_CARE_PROVIDER_SITE_OTHER): Payer: Medicare Other | Admitting: Sports Medicine

## 2015-05-29 ENCOUNTER — Ambulatory Visit: Payer: Self-pay

## 2015-05-29 ENCOUNTER — Encounter: Payer: Self-pay | Admitting: Sports Medicine

## 2015-05-29 DIAGNOSIS — B351 Tinea unguium: Secondary | ICD-10-CM | POA: Diagnosis not present

## 2015-05-29 DIAGNOSIS — M79604 Pain in right leg: Secondary | ICD-10-CM

## 2015-05-29 DIAGNOSIS — M79673 Pain in unspecified foot: Secondary | ICD-10-CM | POA: Diagnosis not present

## 2015-05-29 DIAGNOSIS — M79605 Pain in left leg: Secondary | ICD-10-CM

## 2015-05-29 NOTE — Progress Notes (Signed)
Patient ID: Wayne Jones, male   DOB: 03-03-1961, 54 y.o.   MRN: [id number] Subjective: Wayne Jones is a 53 y.o. male patient seen today in office with complaint of thickened and elongated toenails; unable to trim. Patient is assisted by caregivers from [employer]. Patient denies history of Diabetes, Neuropathy, or Vascular disease. Patient has no other pedal complaints at this time.   Patient Active Problem List   Diagnosis Date Noted  . Benign prostatic hypertrophy without urinary obstruction 01/09/2015  . Chronic kidney disease (CKD), stage I 01/09/2015  . Cognitive decline 01/09/2015  . Well controlled diabetes mellitus ([place]) 01/09/2015  . Acid reflux 01/09/2015  . Hearing loss 01/09/2015  . HLD (hyperlipidemia) 01/09/2015  . Extreme obesity ([place]) 01/09/2015  . Obstructive sleep apnea 01/09/2015  . Other specified causes of urethral stricture 01/09/2015  . Hernia of anterior abdominal wall 01/09/2015  . Difficult or painful urination 07/09/2012  . Incomplete bladder emptying 07/09/2012  . 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 Benign essential HTN 02/04/2007  . Bipolar I disorder, single manic episode, moderate ([place]) 02/04/2007   Current Outpatient Prescriptions on File Prior to Visit  Medication Sig Dispense Refill  . ARIPiprazole (ABILIFY) 20 MG tablet Take 20 mg by mouth daily.    Marland Kitchen aspirin 81 MG tablet Take 81 mg by mouth daily.    Marland Kitchen atorvastatin (LIPITOR) 10 MG tablet Take 1 tablet (10 mg total) by mouth once. 30 tablet 5  . carbamazepine (EQUETRO) 200 MG CP12 12 hr capsule Take 200 mg by mouth 2 (two) times daily.    . carbamide peroxide (DEBROX) 6.5 % otic solution 5 drops 2 (two) times daily.    . chlorhexidine (PERIDEX) 0.12 % solution Use as directed 15 mLs in the mouth or throat 2 (two) times daily.    . clonazePAM (KLONOPIN) 1 MG tablet Take 1 mg by mouth daily.    . diphenhydrAMINE (SOMINEX) 25 MG tablet Take 25 mg by mouth at bedtime as needed for  sleep.    . finasteride (PROSCAR) 5 MG tablet Take 5 mg by mouth daily.    Marland Kitchen FLUoxetine (PROZAC) 20 MG capsule Take 20 mg by mouth 2 (two) times daily.    Marland Kitchen gabapentin (NEURONTIN) 600 MG tablet Take 600 mg by mouth at bedtime.    Marland Kitchen glucose blood test strip 1 each by Other route as needed for other (check BS qd TRUETRACK DEVICE). Use as instructed    . guaiFENesin-dextromethorphan (ROBITUSSIN DM) 100-10 MG/5ML syrup Take 5 mLs by mouth every 4 (four) hours as needed for cough.    Marland Kitchen ibuprofen (ADVIL,MOTRIN) 800 MG tablet Take 800 mg by mouth every 8 (eight) hours as needed.    . loratadine (CLARITIN) 10 MG tablet Take 10 mg by mouth daily.    Marland Kitchen omega-3 acid ethyl esters (LOVAZA) 1 G capsule Take by mouth 2 (two) times daily.    Marland Kitchen omeprazole (PRILOSEC) 20 MG capsule Take 20 mg by mouth daily.    . ondansetron (ZOFRAN) 4 MG tablet Take 4 mg by mouth every 8 (eight) hours as needed for nausea or vomiting.    . potassium chloride SA (K-DUR,KLOR-CON) 20 MEQ tablet Take 1 tablet (20 mEq total) by mouth 2 (two) times daily. 60 tablet 5  . promethazine (PHENERGAN) 25 MG tablet Take 25 mg by mouth every 6 (six) hours as needed for nausea or vomiting.    . propranolol ER (INDERAL LA) 60 MG 24 hr capsule Take 60  mg by mouth 2 (two) times daily.    . ranitidine (ZANTAC) 150 MG tablet TAKE ONE TABLET BY MOUTH AT BEDTIME 30 tablet 0  . tamsulosin (FLOMAX) 0.4 MG CAPS capsule Take 0.4 mg by mouth daily after supper.    . torsemide (DEMADEX) 20 MG tablet Take 20 mg by mouth daily.    . traMADol (ULTRAM) 50 MG tablet Take by mouth every 6 (six) hours as needed.     No current facility-administered medications on file prior to visit.   No Known Allergies     Objective: Physical Exam  General: Well developed, nourished, no acute distress, awake, alert and oriented x 3  Vascular: Dorsalis pedis artery 2/4 bilateral, Posterior tibial artery 1/4 bilateral, skin temperature warm to warm proximal to distal  bilateral lower extremities, no varicosities, pedal hair present bilateral.  Neurological: Gross sensation present via light touch bilateral.   Dermatological: Skin is warm, dry, and supple bilateral, Nails 1-10 are tender, long, thick, and discolored with mild subungal debris, no webspace macerations present bilateral; both hallux nails have fragmented thick nail spicules present, no open lesions present bilateral, no callus/corns/hyperkeratotic tissue present bilateral. No signs of infection bilateral.  Musculoskeletal: Mild flexible asymptomatic 2nd hammertoes noted bilateral. Muscular strength within normal limits without pain or limitation on range of motion. No pain with calf compression bilateral.  Assessment and Plan:  Problem List Items Addressed This Visit    None    Visit Diagnoses    Dermatophytosis of nail    -  Primary    Pain in both lower extremities          -Examined patient.  -Discussed treatment options for painful mycotic nails. -Mechanically debrided and reduced mycotic nails with sterile nail nipper and dremel nail file without incident. -Patient to return in 3 months for follow up evaluation or sooner if symptoms worsen.  Landis Martins, DPM

## 2015-05-31 ENCOUNTER — Other Ambulatory Visit: Payer: Self-pay | Admitting: Family Medicine

## 2015-06-05 ENCOUNTER — Other Ambulatory Visit: Payer: Self-pay | Admitting: Emergency Medicine

## 2015-06-05 DIAGNOSIS — F71 Moderate intellectual disabilities: Secondary | ICD-10-CM | POA: Diagnosis not present

## 2015-06-05 NOTE — Telephone Encounter (Signed)
Spoke to [place] at [place] about patiens medication

## 2015-06-23 ENCOUNTER — Other Ambulatory Visit: Payer: Self-pay | Admitting: Family Medicine

## 2015-07-17 DIAGNOSIS — R300 Dysuria: Secondary | ICD-10-CM | POA: Diagnosis not present

## 2015-07-20 ENCOUNTER — Other Ambulatory Visit: Payer: Self-pay | Admitting: Family Medicine

## 2015-07-23 ENCOUNTER — Other Ambulatory Visit: Payer: Self-pay

## 2015-08-03 ENCOUNTER — Other Ambulatory Visit: Payer: Self-pay | Admitting: Family Medicine

## 2015-08-07 ENCOUNTER — Other Ambulatory Visit: Payer: Self-pay

## 2015-08-29 ENCOUNTER — Other Ambulatory Visit: Payer: Self-pay | Admitting: Family Medicine

## 2015-08-29 MED ORDER — ASPIRIN 81 MG PO TABS: 81.0000 mg | ORAL_TABLET | Freq: Every day | ORAL | Status: DC

## 2015-08-31 ENCOUNTER — Ambulatory Visit: Payer: Medicare Other | Admitting: Sports Medicine

## 2015-09-03 DIAGNOSIS — N401 Enlarged prostate with lower urinary tract symptoms: Secondary | ICD-10-CM | POA: Diagnosis not present

## 2015-09-03 DIAGNOSIS — R300 Dysuria: Secondary | ICD-10-CM | POA: Diagnosis not present

## 2015-09-03 DIAGNOSIS — N358 Other urethral stricture: Secondary | ICD-10-CM | POA: Diagnosis not present

## 2015-09-03 DIAGNOSIS — R339 Retention of urine, unspecified: Secondary | ICD-10-CM | POA: Diagnosis not present

## 2015-09-28 ENCOUNTER — Other Ambulatory Visit: Payer: Self-pay | Admitting: Family Medicine

## 2015-09-28 MED ORDER — TORSEMIDE 20 MG PO TABS: ORAL_TABLET | ORAL | Status: DC

## 2015-09-28 MED ORDER — LORATADINE 10 MG PO TABS: ORAL_TABLET | ORAL | Status: DC

## 2015-11-20 ENCOUNTER — Other Ambulatory Visit: Payer: Self-pay

## 2015-11-20 MED ORDER — OMEPRAZOLE 20 MG PO CPDR: DELAYED_RELEASE_CAPSULE | ORAL | Status: DC

## 2015-11-20 MED ORDER — RANITIDINE HCL 150 MG PO TABS: 150.0000 mg | ORAL_TABLET | Freq: Every day | ORAL | Status: DC

## 2015-12-04 DIAGNOSIS — F71 Moderate intellectual disabilities: Secondary | ICD-10-CM | POA: Diagnosis not present

## 2015-12-24 ENCOUNTER — Other Ambulatory Visit: Payer: Self-pay

## 2015-12-24 DIAGNOSIS — E119 Type 2 diabetes mellitus without complications: Secondary | ICD-10-CM | POA: Diagnosis not present

## 2015-12-24 MED ORDER — NYSTATIN 100000 UNIT/GM EX POWD
Freq: Four times a day (QID) | CUTANEOUS | Status: DC
Start: 2015-12-24 — End: 2016-01-10

## 2015-12-24 NOTE — Telephone Encounter (Signed)
Patient requesting refill. 

## 2016-01-10 ENCOUNTER — Ambulatory Visit (INDEPENDENT_AMBULATORY_CARE_PROVIDER_SITE_OTHER): Payer: Medicare Other | Admitting: Family Medicine

## 2016-01-10 VITALS — BP 110/76 | HR 65 | Temp 97.6°F | Resp 18 | Ht 72.0 in | Wt 284.1 lb

## 2016-01-10 DIAGNOSIS — Z79899 Other long term (current) drug therapy: Secondary | ICD-10-CM | POA: Diagnosis not present

## 2016-01-10 DIAGNOSIS — E119 Type 2 diabetes mellitus without complications: Secondary | ICD-10-CM

## 2016-01-10 DIAGNOSIS — K219 Gastro-esophageal reflux disease without esophagitis: Secondary | ICD-10-CM | POA: Diagnosis not present

## 2016-01-10 DIAGNOSIS — F3012 Manic episode without psychotic symptoms, moderate: Secondary | ICD-10-CM

## 2016-01-10 DIAGNOSIS — I10 Essential (primary) hypertension: Secondary | ICD-10-CM | POA: Diagnosis not present

## 2016-01-10 DIAGNOSIS — B356 Tinea cruris: Secondary | ICD-10-CM | POA: Diagnosis not present

## 2016-01-10 DIAGNOSIS — E785 Hyperlipidemia, unspecified: Secondary | ICD-10-CM

## 2016-01-10 MED ORDER — FLUOXETINE HCL 20 MG PO CAPS: 20.0000 mg | ORAL_CAPSULE | Freq: Every day | ORAL | Status: DC

## 2016-01-10 MED ORDER — CARBAMAZEPINE ER 200 MG PO CP12: 200.0000 mg | ORAL_CAPSULE | Freq: Two times a day (BID) | ORAL | Status: DC

## 2016-01-10 MED ORDER — RANITIDINE HCL 150 MG PO TABS: 150.0000 mg | ORAL_TABLET | Freq: Every day | ORAL | Status: DC

## 2016-01-10 MED ORDER — PROPRANOLOL HCL ER 60 MG PO CP24: 60.0000 mg | ORAL_CAPSULE | Freq: Every day | ORAL | Status: DC

## 2016-01-10 MED ORDER — NYSTATIN 100000 UNIT/GM EX POWD: Freq: Three times a day (TID) | CUTANEOUS | Status: DC

## 2016-01-10 MED ORDER — OMEGA-3-ACID ETHYL ESTERS 1 G PO CAPS: 1.0000 g | ORAL_CAPSULE | Freq: Two times a day (BID) | ORAL | Status: DC

## 2016-01-10 NOTE — Progress Notes (Signed)
Date:  01/10/2016   Name:  Wayne Jones   DOB:  Jul 13, 1961   MRN:  [id number]  PCP:  Ashok Norris, MD    Chief Complaint: Medication Refill   History of Present Illness:  This is a 55 y.o. male seen for refill Zantac, propranolol, Lovaza, carbamazepine, and prozac. Also needs nystatin cream changed to powder for tinea cruris. No new concerns per caregiver.  Review of Systems:  Review of Systems  Constitutional: Negative for fever.  Respiratory: Negative for cough and shortness of breath.   Cardiovascular: Negative for chest pain and leg swelling.  Neurological: Negative for syncope and weakness.    Patient Active Problem List   Diagnosis Date Noted  . Benign prostatic hypertrophy without urinary obstruction 01/09/2015  . Chronic kidney disease (CKD), stage I 01/09/2015  . Cognitive decline 01/09/2015  . Well controlled diabetes mellitus ([place]) 01/09/2015  . Acid reflux 01/09/2015  . Hearing loss 01/09/2015  . HLD (hyperlipidemia) 01/09/2015  . Extreme obesity ([place]) 01/09/2015  . Obstructive sleep apnea 01/09/2015  . Other specified causes of urethral stricture 01/09/2015  . Hernia of anterior abdominal wall 01/09/2015  . Difficult or painful urination 07/09/2012  . Incomplete bladder emptying 07/09/2012  . 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 Benign essential HTN 02/04/2007  . Bipolar I disorder, single manic episode, moderate ([place]) 02/04/2007    Prior to Admission medications   Medication Sig Start Date End Date Taking? Authorizing Provider  ARIPiprazole (ABILIFY) 20 MG tablet Take 20 mg by mouth daily.    Historical Provider, MD  aspirin 81 MG tablet Take 1 tablet (81 mg total) by mouth daily. 08/29/15   Ashok Norris, MD  atorvastatin (LIPITOR) 10 MG tablet Take 1 tablet (10 mg total) by mouth once. 04/18/15   Ashok Norris, MD  carbamazepine (EQUETRO) 200 MG CP12 12 hr capsule Take 1 capsule (200 mg total) by mouth 2 (two) times daily. 01/10/16   Adline Potter, MD   carbamide peroxide (DEBROX) 6.5 % otic solution 5 drops 2 (two) times daily.    Historical Provider, MD  chlorhexidine (PERIDEX) 0.12 % solution Use as directed 15 mLs in the mouth or throat 2 (two) times daily.    Historical Provider, MD  clonazePAM (KLONOPIN) 1 MG tablet Take 1 mg by mouth daily.    Historical Provider, MD  diphenhydrAMINE (SOMINEX) 25 MG tablet Take 25 mg by mouth at bedtime as needed for sleep.    Historical Provider, MD  finasteride (PROSCAR) 5 MG tablet Take 5 mg by mouth daily.    Historical Provider, MD  FLUoxetine (PROZAC) 20 MG capsule Take 1 capsule (20 mg total) by mouth daily. 01/10/16   Adline Potter, MD  gabapentin (NEURONTIN) 600 MG tablet Take 600 mg by mouth at bedtime.    Historical Provider, MD  guaiFENesin-dextromethorphan (ROBITUSSIN DM) 100-10 MG/5ML syrup Take 5 mLs by mouth every 4 (four) hours as needed for cough.    Historical Provider, MD  ibuprofen (ADVIL,MOTRIN) 800 MG tablet Take 800 mg by mouth every 8 (eight) hours as needed.    Historical Provider, MD  loratadine (CLARITIN) 10 MG tablet TAKE (1) TABLET BY MOUTH DAILY FOR ALLERGY. 09/28/15   Ashok Norris, MD  nystatin (NYSTATIN) powder Apply topically 3 (three) times daily. 01/10/16   Adline Potter, MD  omega-3 acid ethyl esters (LOVAZA) 1 g capsule Take 1 capsule (1 g total) by mouth 2 (two) times daily. 01/10/16   Adline Potter, MD  omeprazole (PRILOSEC) 20 MG capsule TAKE  1 CAPSULE BY MOUTH ONCE A DAY.. (G.E.R.D.) DO NOT CRUSH 11/20/15   Ashok Norris, MD  ondansetron (ZOFRAN) 4 MG tablet Take 4 mg by mouth every 8 (eight) hours as needed for nausea or vomiting.    Historical Provider, MD  potassium chloride SA (K-DUR,KLOR-CON) 20 MEQ tablet Take 1 tablet (20 mEq total) by mouth 2 (two) times daily. 04/18/15   Ashok Norris, MD  promethazine (PHENERGAN) 25 MG tablet Take 25 mg by mouth every 6 (six) hours as needed for nausea or vomiting.    Historical Provider, MD  propranolol ER (INDERAL LA) 60  MG 24 hr capsule Take 1 capsule (60 mg total) by mouth daily. 01/10/16   Adline Potter, MD  ranitidine (ZANTAC) 150 MG tablet Take 1 tablet (150 mg total) by mouth at bedtime. 01/10/16   Adline Potter, MD  tamsulosin (FLOMAX) 0.4 MG CAPS capsule Take 0.4 mg by mouth daily after supper.    Historical Provider, MD  torsemide (DEMADEX) 20 MG tablet TAKE ONE TABLET BY MOUTH ONCE DAILY. (EDEMA/DIURETIC) 09/28/15   Ashok Norris, MD  traMADol (ULTRAM) 50 MG tablet Take by mouth every 6 (six) hours as needed.    Historical Provider, MD  [place] TEST test strip FINGERSTICK BLOOD SUGAR TEST ONCE DAILY. 08/06/15   Ashok Norris, MD    No Known Allergies  Past Surgical History  Procedure Laterality Date  . Ventral hernia repair    . Laparoscopic cholecystectomy    . Laparoscopy  12/03/2010    Social History  Substance Use Topics  . Smoking status: Never Smoker   . Smokeless tobacco: Never Used  . Alcohol Use: No    Family History  Problem Relation Age of Onset  . Family history unknown: Yes    Medication list has been reviewed and updated.  Physical Examination: BP 110/76 mmHg  Pulse 65  Temp(Src) 97.6 F (36.4 C)  Resp 18  Ht 6' (1.829 m)  Wt 284 lb 2 oz (128.878 kg)  BMI 38.53 kg/m2  SpO2 98%  Physical Exam  Constitutional: He appears well-developed and well-nourished.  Cardiovascular: Normal rate, regular rhythm and normal heart sounds.   Pulmonary/Chest: Effort normal and breath sounds normal.  Musculoskeletal: He exhibits no edema.  Neurological: He is alert.  Skin: Skin is warm and dry.  Psychiatric: He has a normal mood and affect. His behavior is normal.  Nursing note and vitals reviewed.   Assessment and Plan:  1. Benign essential HTN Well controlled, refill propranolol  2. HLD (hyperlipidemia) Well controlled, refill Lovaza  3. Gastroesophageal reflux disease, esophagitis presence not specified Well controlled, refill Zantac  4. Bipolar I disorder, single  manic episode, moderate ([place]) Well controlled, refill carbamazepine/Prozac  5. Well controlled diabetes mellitus ([place]) A1c 5.3% yesterday  6. Tinea cruris Change nystatin cream to powder tid  7. Polypharmacy Consider addressing next visit  Return in about 6 months (around 07/11/2016).  Satira Anis. [facility]  01/10/2016

## 2016-01-16 ENCOUNTER — Ambulatory Visit: Payer: Self-pay | Admitting: Family Medicine

## 2016-01-18 ENCOUNTER — Telehealth: Payer: Self-pay | Admitting: Family Medicine

## 2016-01-18 NOTE — Telephone Encounter (Signed)
Canary Brim called needing clarification on the medication Equertro 200mg  for Wayne Jones.  Fritz Pickerel stated that patient [name] was taking 1 tablet in the morning and 2 in the evening, but the current prescription has patient taking 1 tablet twice daily.  Please advise. Fritz Pickerel can be reached at [phone].

## 2016-01-21 NOTE — Telephone Encounter (Signed)
Left message for Larry,he does not come in until 2 pm.

## 2016-02-08 ENCOUNTER — Ambulatory Visit (INDEPENDENT_AMBULATORY_CARE_PROVIDER_SITE_OTHER): Payer: Medicare Other | Admitting: Sports Medicine

## 2016-02-08 ENCOUNTER — Encounter: Payer: Self-pay | Admitting: Sports Medicine

## 2016-02-08 DIAGNOSIS — M79676 Pain in unspecified toe(s): Secondary | ICD-10-CM | POA: Diagnosis not present

## 2016-02-08 DIAGNOSIS — B351 Tinea unguium: Secondary | ICD-10-CM

## 2016-02-08 NOTE — Progress Notes (Signed)
Patient ID: Wayne Jones, male   DOB: November 07, 1960, 55 y.o.   MRN: [id number]   Subjective: Wayne Jones is a 55 y.o. male patient seen today in office with complaint of thickened and elongated toenails; unable to trim. Patient is assisted by caregivers from [employer] who state that patient missed appts because of staffing changes. Patient denies history of Diabetes, Neuropathy, or Vascular disease. Patient has no other pedal complaints at this time.   Patient Active Problem List   Diagnosis Date Noted  . Polypharmacy 01/10/2016  . Benign prostatic hypertrophy without urinary obstruction 01/09/2015  . Chronic kidney disease (CKD), stage I 01/09/2015  . Cognitive decline 01/09/2015  . Well controlled diabetes mellitus ([place]) 01/09/2015  . Acid reflux 01/09/2015  . Hearing loss 01/09/2015  . HLD (hyperlipidemia) 01/09/2015  . Extreme obesity ([place]) 01/09/2015  . Obstructive sleep apnea 01/09/2015  . Other specified causes of urethral stricture 01/09/2015  . Hernia of anterior abdominal wall 01/09/2015  . Difficult or painful urination 07/09/2012  . Incomplete bladder emptying 07/09/2012  . 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 Benign essential HTN 02/04/2007  . Bipolar I disorder, single manic episode, moderate ([employer]) 02/04/2007   Current Outpatient Prescriptions on File Prior to Visit  Medication Sig Dispense Refill  . ARIPiprazole (ABILIFY) 20 MG tablet Take 20 mg by mouth daily.    Marland Kitchen aspirin 81 MG tablet Take 1 tablet (81 mg total) by mouth daily. 30 tablet 12  . atorvastatin (LIPITOR) 10 MG tablet Take 1 tablet (10 mg total) by mouth once. 30 tablet 5  . carbamazepine (EQUETRO) 200 MG CP12 12 hr capsule Take 1 capsule (200 mg total) by mouth 2 (two) times daily. 180 each 3  . carbamide peroxide (DEBROX) 6.5 % otic solution 5 drops 2 (two) times daily.    . chlorhexidine (PERIDEX) 0.12 % solution Use as directed 15 mLs in the mouth or throat 2 (two) times daily.    .  clonazePAM (KLONOPIN) 1 MG tablet Take 1 mg by mouth daily.    . diphenhydrAMINE (SOMINEX) 25 MG tablet Take 25 mg by mouth at bedtime as needed for sleep.    . finasteride (PROSCAR) 5 MG tablet Take 5 mg by mouth daily.    Marland Kitchen FLUoxetine (PROZAC) 20 MG capsule Take 1 capsule (20 mg total) by mouth daily. 90 capsule 3  . gabapentin (NEURONTIN) 600 MG tablet Take 600 mg by mouth at bedtime.    Marland Kitchen guaiFENesin-dextromethorphan (ROBITUSSIN DM) 100-10 MG/5ML syrup Take 5 mLs by mouth every 4 (four) hours as needed for cough.    Marland Kitchen ibuprofen (ADVIL,MOTRIN) 800 MG tablet Take 800 mg by mouth every 8 (eight) hours as needed.    . loratadine (CLARITIN) 10 MG tablet TAKE (1) TABLET BY MOUTH DAILY FOR ALLERGY. 30 tablet 5  . nystatin (NYSTATIN) powder Apply topically 3 (three) times daily. 15 g 0  . omega-3 acid ethyl esters (LOVAZA) 1 g capsule Take 1 capsule (1 g total) by mouth 2 (two) times daily. 180 capsule 3  . omeprazole (PRILOSEC) 20 MG capsule TAKE 1 CAPSULE BY MOUTH ONCE A DAY.. (G.E.R.D.) DO NOT CRUSH 30 capsule 2  . ondansetron (ZOFRAN) 4 MG tablet Take 4 mg by mouth every 8 (eight) hours as needed for nausea or vomiting.    . potassium chloride SA (K-DUR,KLOR-CON) 20 MEQ tablet Take 1 tablet (20 mEq total) by mouth 2 (two) times daily. 60 tablet 5  . promethazine (PHENERGAN) 25 MG tablet  Take 25 mg by mouth every 6 (six) hours as needed for nausea or vomiting.    . propranolol ER (INDERAL LA) 60 MG 24 hr capsule Take 1 capsule (60 mg total) by mouth daily. 90 capsule 3  . ranitidine (ZANTAC) 150 MG tablet Take 1 tablet (150 mg total) by mouth at bedtime. 90 tablet 3  . tamsulosin (FLOMAX) 0.4 MG CAPS capsule Take 0.4 mg by mouth daily after supper.    . torsemide (DEMADEX) 20 MG tablet TAKE ONE TABLET BY MOUTH ONCE DAILY. (EDEMA/DIURETIC) 28 tablet 5  . traMADol (ULTRAM) 50 MG tablet Take by mouth every 6 (six) hours as needed.    [facility] TEST test strip FINGERSTICK BLOOD SUGAR TEST ONCE DAILY.  100 each 0   No current facility-administered medications on file prior to visit.   No Known Allergies    Objective: Physical Exam  General: Well developed, nourished, no acute distress, awake, alert and oriented x 3  Vascular: Dorsalis pedis artery 2/4 bilateral, Posterior tibial artery 1/4 bilateral, skin temperature warm to warm proximal to distal bilateral lower extremities, no varicosities, pedal hair present bilateral.  Neurological: Gross sensation present via light touch bilateral.   Dermatological: Skin is warm, dry, and supple bilateral, Nails 1-10 are tender, long, thick, and discolored with mild subungal debris, no webspace macerations present bilateral; both hallux nails have fragmented thick nail spicules present, no open lesions present bilateral, no callus/corns/hyperkeratotic tissue present bilateral. No signs of infection bilateral.  Musculoskeletal: Mild flexible asymptomatic 2nd hammertoes noted bilateral. Muscular strength within normal limits without pain or limitation on range of motion. No pain with calf compression bilateral.  Assessment and Plan:  Problem List Items Addressed This Visit    None    Visit Diagnoses    Dermatophytosis of nail    -  Primary    Pain of toe, unspecified laterality          -Examined patient.  -Discussed treatment options for painful mycotic nails. -Mechanically debrided and reduced mycotic nails with sterile nail nipper and dremel nail file without incident. -Patient to return in 3 months for follow up evaluation or sooner if symptoms worsen.  Landis Martins, DPM

## 2016-02-15 ENCOUNTER — Other Ambulatory Visit: Payer: Self-pay

## 2016-02-15 MED ORDER — OMEPRAZOLE 20 MG PO CPDR: DELAYED_RELEASE_CAPSULE | ORAL | Status: DC

## 2016-02-15 NOTE — Telephone Encounter (Signed)
[facility] requesting a refill of this patient's omeprazole Dr 20mg .  Refill request was sent to Dr. Steele Sizer for approval and submission.

## 2016-03-12 ENCOUNTER — Other Ambulatory Visit: Payer: Self-pay

## 2016-03-12 NOTE — Telephone Encounter (Addendum)
Patient requesting refill of Torsemide and Loratadine.

## 2016-03-13 MED ORDER — TORSEMIDE 20 MG PO TABS: ORAL_TABLET | ORAL | 0 refills | Status: DC

## 2016-03-13 MED ORDER — LORATADINE 10 MG PO TABS: ORAL_TABLET | ORAL | 5 refills | Status: DC

## 2016-03-17 ENCOUNTER — Ambulatory Visit (INDEPENDENT_AMBULATORY_CARE_PROVIDER_SITE_OTHER): Payer: Medicare Other | Admitting: Family Medicine

## 2016-03-17 ENCOUNTER — Encounter: Payer: Self-pay | Admitting: Family Medicine

## 2016-03-17 VITALS — BP 118/80 | HR 61 | Temp 97.6°F | Resp 18 | Ht 72.0 in | Wt 280.1 lb

## 2016-03-17 DIAGNOSIS — Z23 Encounter for immunization: Secondary | ICD-10-CM | POA: Diagnosis not present

## 2016-03-17 DIAGNOSIS — E1129 Type 2 diabetes mellitus with other diabetic kidney complication: Secondary | ICD-10-CM

## 2016-03-17 DIAGNOSIS — Z0000 Encounter for general adult medical examination without abnormal findings: Secondary | ICD-10-CM

## 2016-03-17 DIAGNOSIS — E785 Hyperlipidemia, unspecified: Secondary | ICD-10-CM | POA: Diagnosis not present

## 2016-03-17 DIAGNOSIS — F79 Unspecified intellectual disabilities: Secondary | ICD-10-CM | POA: Diagnosis not present

## 2016-03-17 DIAGNOSIS — I10 Essential (primary) hypertension: Secondary | ICD-10-CM | POA: Diagnosis not present

## 2016-03-17 DIAGNOSIS — R809 Proteinuria, unspecified: Secondary | ICD-10-CM

## 2016-03-17 LAB — POCT UA - MICROALBUMIN: MICROALBUMIN (UR) POC: NEGATIVE mg/L

## 2016-03-17 NOTE — Progress Notes (Signed)
Name: Wayne Jones   MRN: [id number]    DOB: July 25, 1961   Date:03/17/2016       Progress Note  Subjective  Chief Complaint  Chief Complaint  Patient presents with  . Annual Exam    HPI  Functional ability/safety issues: No Issues - lives in a group home  Hearing issues: on his records he has a history of hearing loss, but caregiver has not noticed problems.  Activities of daily living: Dependent for bathing, dressing , taking medication, transportation and meal prep.  Home safety issues: No Issues  End Of Life Planning: caregiver thinks he has  advanced directives, healthcare power of attorney.   Preventative care, Health maintenance, Preventative health measures discussed.  Preventative screenings discussed today: lab work, colonoscopy, PSA. - checked by Urologist  Men age 71 to 13 years if ever smoked recommended to get a one time AAA ultrasound screening exam. No previous history of tobacco use  Low Dose CT Chest recommended if Age 46-80 years, 19 pack-year currently smoking OR have quit w/in 15years.   Lifestyle risk factor issued reviewed: Diet, exercise, weight management, advised patient smoking is not healthy, nutrition/diet.  Preventative health measures discussed (5-10 year plan).  Reviewed and recommended vaccinations: - Pneumovax  - Prevnar  - Annual Influenza - Zostavax - Tdap   Depression screening: Done Fall risk screening: Done Discuss ADLs/IADLs: Done  Current medical providers: See HPI  Other health risk factors identified this visit: No other issues Cognitive impairment issues: None identified  All above discussed with patient. Appropriate education, counseling and referral will be made based upon the above.   DMII with microalbuminuria: he is not on ACE, glucose at home has been controlled, fasting in the 120's, on diet only. He is up to date with foot and eye exam. He states he has polydipsia and polyuria. He also likes to eat.   Obesity: he  has lost 4 lbs since last visit, he has been moving more at the group home, but he naps at the day program. He likes to eat.   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   HTN: on beta-blocker only, we will recheck urine micro and if needed we will add ACE/ARB  BPH: he sees Dr. Jacqlyn Larsen, and is taking medication to control symptoms.    Patient Active Problem List   Diagnosis Date Noted  . MR (mental retardation) 03/17/2016  . Polypharmacy 01/10/2016  . Benign prostatic hypertrophy without urinary obstruction 01/09/2015  . Chronic kidney disease (CKD), stage I 01/09/2015  . Cognitive decline 01/09/2015  . Controlled type 2 diabetes mellitus with microalbuminuria ([place]) 01/09/2015  . Acid reflux 01/09/2015  . Hearing loss 01/09/2015  . HLD (hyperlipidemia) 01/09/2015  . Extreme obesity ([place]) 01/09/2015  . Obstructive sleep apnea 01/09/2015  . Other specified causes of urethral stricture 01/09/2015  . Hernia of anterior abdominal wall 01/09/2015  . Difficult or painful urination 07/09/2012  . Incomplete bladder emptying 07/09/2012  . 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 Benign essential HTN 02/04/2007  . Bipolar I disorder, single manic episode, moderate ([place]) 02/04/2007    Past Surgical History:  Procedure Laterality Date  . LAPAROSCOPIC CHOLECYSTECTOMY    . LAPAROSCOPY  12/03/2010  . VENTRAL HERNIA REPAIR      Family History  Problem Relation Age of Onset  . Family history unknown: Yes    Social History   Social History  .  Marital status: Single    Spouse name: N/A  . Number of children: N/A  . Years of education: N/A   Occupational History  . Not on file.   Social History Main Topics  . Smoking status: Never Smoker  . Smokeless tobacco: Never Used  . Alcohol use No  . Drug use: No  . Sexual activity: No   Other Topics Concern  . Not on file   Social History Narrative  . No  narrative on file     Current Outpatient Prescriptions:  .  ARIPiprazole (ABILIFY) 20 MG tablet, Take 20 mg by mouth daily., Disp: , Rfl:  .  aspirin 81 MG tablet, Take 1 tablet (81 mg total) by mouth daily., Disp: 30 tablet, Rfl: 12 .  atorvastatin (LIPITOR) 10 MG tablet, Take 1 tablet (10 mg total) by mouth once., Disp: 30 tablet, Rfl: 5 .  carbamazepine (EQUETRO) 200 MG CP12 12 hr capsule, Take 1 capsule (200 mg total) by mouth 2 (two) times daily., Disp: 180 each, Rfl: 3 .  carbamide peroxide (DEBROX) 6.5 % otic solution, 5 drops 2 (two) times daily., Disp: , Rfl:  .  chlorhexidine (PERIDEX) 0.12 % solution, Use as directed 15 mLs in the mouth or throat 2 (two) times daily., Disp: , Rfl:  .  clonazePAM (KLONOPIN) 1 MG tablet, Take 1 mg by mouth daily., Disp: , Rfl:  .  finasteride (PROSCAR) 5 MG tablet, Take 5 mg by mouth daily., Disp: , Rfl:  .  FLUoxetine (PROZAC) 20 MG capsule, Take 1 capsule (20 mg total) by mouth daily., Disp: 90 capsule, Rfl: 3 .  gabapentin (NEURONTIN) 600 MG tablet, Take 600 mg by mouth at bedtime., Disp: , Rfl:  .  guaiFENesin-dextromethorphan (ROBITUSSIN DM) 100-10 MG/5ML syrup, Take 5 mLs by mouth every 4 (four) hours as needed for cough., Disp: , Rfl:  .  ibuprofen (ADVIL,MOTRIN) 800 MG tablet, Take 800 mg by mouth every 8 (eight) hours as needed., Disp: , Rfl:  .  loratadine (CLARITIN) 10 MG tablet, TAKE (1) TABLET BY MOUTH DAILY FOR ALLERGY., Disp: 30 tablet, Rfl: 5 .  nystatin (NYSTATIN) powder, Apply topically 3 (three) times daily., Disp: 15 g, Rfl: 0 .  omega-3 acid ethyl esters (LOVAZA) 1 g capsule, Take 1 capsule (1 g total) by mouth 2 (two) times daily., Disp: 180 capsule, Rfl: 3 .  omeprazole (PRILOSEC) 20 MG capsule, TAKE 1 CAPSULE BY MOUTH ONCE A DAY.. (G.E.R.D.) DO NOT CRUSH, Disp: 30 capsule, Rfl: 1 .  ondansetron (ZOFRAN) 4 MG tablet, Take 4 mg by mouth every 8 (eight) hours as needed for nausea or vomiting., Disp: , Rfl:  .  potassium chloride SA  (K-DUR,KLOR-CON) 20 MEQ tablet, Take 1 tablet (20 mEq total) by mouth 2 (two) times daily., Disp: 60 tablet, Rfl: 5 .  promethazine (PHENERGAN) 25 MG tablet, Take 25 mg by mouth every 6 (six) hours as needed for nausea or vomiting., Disp: , Rfl:  .  propranolol ER (INDERAL LA) 60 MG 24 hr capsule, Take 1 capsule (60 mg total) by mouth daily., Disp: 90 capsule, Rfl: 3 .  ranitidine (ZANTAC) 150 MG tablet, Take 1 tablet (150 mg total) by mouth at bedtime., Disp: 90 tablet, Rfl: 3 .  tamsulosin (FLOMAX) 0.4 MG CAPS capsule, Take 0.4 mg by mouth daily after supper., Disp: , Rfl:  .  torsemide (DEMADEX) 20 MG tablet, TAKE ONE TABLET BY MOUTH ONCE DAILY. (EDEMA/DIURETIC), Disp: 28 tablet, Rfl: 0 .  traMADol (ULTRAM) 50  MG tablet, Take by mouth every 6 (six) hours as needed., Disp: , Rfl:  .  TRUETRACK TEST test strip, FINGERSTICK BLOOD SUGAR TEST ONCE DAILY., Disp: 100 each, Rfl: 0 .  diphenhydrAMINE (SOMINEX) 25 MG tablet, Take 25 mg by mouth at bedtime as needed for sleep., Disp: , Rfl:   No Known Allergies   ROS  Ten systems reviewed and is negative except as mentioned in HPI  No reliable in giving history, verbal, but hard to understand, but he seems to be doing well overall  Objective  Vitals:   03/17/16 1058  BP: 118/80  Pulse: 61  Resp: 18  Temp: 97.6 F (36.4 C)  SpO2: 97%  Weight: 280 lb 2 oz (127.1 kg)  Height: 6' (1.829 m)    Body mass index is 37.99 kg/m.  Physical Exam  Constitutional: Patient appears well-developed and well-nourished. Obese  No distress.  HEENT: head atraumatic, normocephalic, pupils equal and reactive to light,neck supple, throat within normal limits Cardiovascular: Normal rate, regular rhythm and normal heart sounds.  No murmur heard. Trace BLE edema. Pulmonary/Chest: Effort normal and breath sounds normal. No respiratory distress. Abdominal: Soft.  There is no tenderness. Psychiatric: Calm and cooperative, spoke only a few words, not sure if he  understands me Skin: excoriation on right anterior tibia, some lichen formation, no oozing or signs of infection   PHQ2/9: Depression screen PHQ 2/9 03/14/2015  Decreased Interest 0  Down, Depressed, Hopeless 1  PHQ - 2 Score 1     Fall Risk: Fall Risk  03/14/2015 03/14/2015  Falls in the past year? No No      Functional Status Survey: Is the patient deaf or have difficulty hearing?: Yes (by history) Does the patient have difficulty seeing, even when wearing glasses/contacts?: No Does the patient have difficulty concentrating, remembering, or making decisions?: Yes Does the patient have difficulty walking or climbing stairs?: No Does the patient have difficulty dressing or bathing?: Yes Does the patient have difficulty doing errands alone such as visiting a doctor's office or shopping?: Yes    Assessment & Plan  1. Medicare annual wellness visit, subsequent  Discussed importance of 150 minutes of physical activity weekly, eat two servings of fish weekly, eat one serving of tree nuts ( cashews, pistachios, pecans, almonds.Marland Kitchen) every other day, eat 6 servings of fruit/vegetables daily and drink plenty of water and avoid sweet beverages.   2. MR (mental retardation)  Stable   3. Controlled type 2 diabetes mellitus with microalbuminuria, without long-term current use of insulin (HCC)  - COMPLETE METABOLIC PANEL WITH GFR - POCT UA - Microalbumin , back to normal today  4. Benign essential HTN  - COMPLETE METABOLIC PANEL WITH GFR  5. Morbid obesity, unspecified obesity type [facility])  Discussed with the patient the risk posed by an increased BMI. Discussed importance of portion control, calorie counting and at least 150 minutes of physical activity weekly. Avoid sweet beverages and drink more water. Eat at least 6 servings of fruit and vegetables daily   6. HLD (hyperlipidemia)  - Lipid panel  7. Need for pneumococcal vaccination  - Pneumococcal polysaccharide vaccine  23-valent greater than or equal to 2yo subcutaneous/IM  8. Need for Tdap vaccination  - Tdap vaccine greater than or equal to 7yo IM

## 2016-03-18 ENCOUNTER — Other Ambulatory Visit: Payer: Self-pay

## 2016-03-18 DIAGNOSIS — F71 Moderate intellectual disabilities: Secondary | ICD-10-CM | POA: Diagnosis not present

## 2016-03-18 DIAGNOSIS — E785 Hyperlipidemia, unspecified: Secondary | ICD-10-CM | POA: Diagnosis not present

## 2016-03-18 DIAGNOSIS — E1129 Type 2 diabetes mellitus with other diabetic kidney complication: Secondary | ICD-10-CM | POA: Diagnosis not present

## 2016-03-18 DIAGNOSIS — I10 Essential (primary) hypertension: Secondary | ICD-10-CM | POA: Diagnosis not present

## 2016-03-18 DIAGNOSIS — R809 Proteinuria, unspecified: Secondary | ICD-10-CM | POA: Diagnosis not present

## 2016-03-18 LAB — COMPLETE METABOLIC PANEL WITH GFR
ALBUMIN: 4.3 g/dL (ref 3.6–5.1)
ALK PHOS: 130 U/L — AB (ref 40–115)
ALT: 25 U/L (ref 9–46)
AST: 18 U/L (ref 10–35)
BILIRUBIN TOTAL: 0.5 mg/dL (ref 0.2–1.2)
BUN: 16 mg/dL (ref 7–25)
CALCIUM: 9 mg/dL (ref 8.6–10.3)
CO2: 29 mmol/L (ref 20–31)
CREATININE: 0.89 mg/dL (ref 0.70–1.33)
Chloride: 103 mmol/L (ref 98–110)
GFR, Est African American: >89 mL/min (ref >=60)
GFR, Est Non African American: >89 mL/min (ref >=60)
GLUCOSE: 149 mg/dL — AB (ref 65–99)
Potassium: 4.2 mmol/L (ref 3.5–5.3)
Sodium: 141 mmol/L (ref 135–146)
TOTAL PROTEIN: 6.5 g/dL (ref 6.1–8.1)

## 2016-03-18 LAB — LIPID PANEL
CHOL/HDL RATIO: 3 ratio (ref <=5.0)
CHOLESTEROL: 125 mg/dL (ref 125–200)
HDL: 42 mg/dL (ref >=40)
LDL Cholesterol: 57 mg/dL (ref <130)
Triglycerides: 131 mg/dL (ref <150)
VLDL: 26 mg/dL (ref <30)

## 2016-03-19 ENCOUNTER — Other Ambulatory Visit: Payer: Self-pay

## 2016-03-19 MED ORDER — TORSEMIDE 20 MG PO TABS: ORAL_TABLET | ORAL | 0 refills | Status: DC

## 2016-03-19 NOTE — Telephone Encounter (Signed)
Patient requesting refill of Torsemide 20 mg be sent to [facility].

## 2016-04-04 ENCOUNTER — Other Ambulatory Visit: Payer: Self-pay | Admitting: Family Medicine

## 2016-04-10 ENCOUNTER — Other Ambulatory Visit: Payer: Self-pay

## 2016-04-10 MED ORDER — LORATADINE 10 MG PO TABS: ORAL_TABLET | ORAL | 5 refills | Status: DC

## 2016-04-10 MED ORDER — ATORVASTATIN CALCIUM 10 MG PO TABS
10.0000 mg | ORAL_TABLET | Freq: Once | ORAL | 5 refills | Status: DC
Start: 2016-04-10 — End: 2016-05-29

## 2016-04-10 MED ORDER — TORSEMIDE 20 MG PO TABS: ORAL_TABLET | ORAL | 5 refills | Status: DC

## 2016-04-10 MED ORDER — POTASSIUM CHLORIDE CRYS ER 20 MEQ PO TBCR: 20.0000 meq | EXTENDED_RELEASE_TABLET | Freq: Two times a day (BID) | ORAL | 5 refills | Status: DC

## 2016-04-10 NOTE — Telephone Encounter (Signed)
Patient requesting refill of Loratadine, Potassium, Atorvastatin, Torsemide be sent to [facility].

## 2016-04-22 ENCOUNTER — Other Ambulatory Visit: Payer: Self-pay | Admitting: Family Medicine

## 2016-04-22 NOTE — Telephone Encounter (Signed)
Patient requesting refill of Nystatin Powder to [facility].

## 2016-04-28 ENCOUNTER — Other Ambulatory Visit: Payer: Self-pay | Admitting: Family Medicine

## 2016-04-28 NOTE — Telephone Encounter (Signed)
Patient requesting refill of Nystatin cream to [facility].

## 2016-05-07 ENCOUNTER — Other Ambulatory Visit: Payer: Self-pay | Admitting: Family Medicine

## 2016-05-07 NOTE — Telephone Encounter (Signed)
Patient requesting refill of Nystatin to [facility].

## 2016-05-08 ENCOUNTER — Other Ambulatory Visit: Payer: Self-pay | Admitting: Family Medicine

## 2016-05-09 NOTE — Telephone Encounter (Signed)
Patient requesting refill of Omeprazole to [facility].

## 2016-05-29 ENCOUNTER — Other Ambulatory Visit: Payer: Self-pay

## 2016-05-29 NOTE — Telephone Encounter (Signed)
Patient requesting refill of Omega-3,Torsemide, Loratadine, Potassium and Atorvastatin sent to [facility].

## 2016-05-31 MED ORDER — LORATADINE 10 MG PO TABS: ORAL_TABLET | ORAL | 5 refills | Status: DC

## 2016-05-31 MED ORDER — ATORVASTATIN CALCIUM 10 MG PO TABS: 10.0000 mg | ORAL_TABLET | Freq: Once | ORAL | 5 refills | Status: DC

## 2016-05-31 MED ORDER — POTASSIUM CHLORIDE CRYS ER 20 MEQ PO TBCR: 20.0000 meq | EXTENDED_RELEASE_TABLET | Freq: Two times a day (BID) | ORAL | 5 refills | Status: DC

## 2016-05-31 MED ORDER — OMEGA-3-ACID ETHYL ESTERS 1 G PO CAPS: 1.0000 g | ORAL_CAPSULE | Freq: Two times a day (BID) | ORAL | 5 refills | Status: DC

## 2016-05-31 MED ORDER — TORSEMIDE 20 MG PO TABS: ORAL_TABLET | ORAL | 5 refills | Status: DC

## 2016-06-13 ENCOUNTER — Ambulatory Visit (INDEPENDENT_AMBULATORY_CARE_PROVIDER_SITE_OTHER): Payer: Medicare Other

## 2016-06-13 DIAGNOSIS — Z23 Encounter for immunization: Secondary | ICD-10-CM | POA: Diagnosis not present

## 2016-07-15 ENCOUNTER — Encounter: Payer: Self-pay | Admitting: Family Medicine

## 2016-07-15 ENCOUNTER — Ambulatory Visit (INDEPENDENT_AMBULATORY_CARE_PROVIDER_SITE_OTHER): Payer: Medicare Other | Admitting: Family Medicine

## 2016-07-15 VITALS — BP 122/78 | HR 63 | Temp 97.6°F | Resp 16 | Ht 72.0 in | Wt 281.3 lb

## 2016-07-15 DIAGNOSIS — F3012 Manic episode without psychotic symptoms, moderate: Secondary | ICD-10-CM | POA: Diagnosis not present

## 2016-07-15 DIAGNOSIS — Z79899 Other long term (current) drug therapy: Secondary | ICD-10-CM

## 2016-07-15 DIAGNOSIS — I10 Essential (primary) hypertension: Secondary | ICD-10-CM | POA: Diagnosis not present

## 2016-07-15 DIAGNOSIS — E1129 Type 2 diabetes mellitus with other diabetic kidney complication: Secondary | ICD-10-CM | POA: Diagnosis not present

## 2016-07-15 DIAGNOSIS — R809 Proteinuria, unspecified: Secondary | ICD-10-CM

## 2016-07-15 DIAGNOSIS — K219 Gastro-esophageal reflux disease without esophagitis: Secondary | ICD-10-CM

## 2016-07-15 DIAGNOSIS — F79 Unspecified intellectual disabilities: Secondary | ICD-10-CM

## 2016-07-15 DIAGNOSIS — E782 Mixed hyperlipidemia: Secondary | ICD-10-CM | POA: Diagnosis not present

## 2016-07-15 LAB — POCT GLYCOSYLATED HEMOGLOBIN (HGB A1C): HEMOGLOBIN A1C: 6.7

## 2016-07-15 MED ORDER — OMEPRAZOLE 20 MG PO CPDR: DELAYED_RELEASE_CAPSULE | ORAL | 1 refills | Status: DC

## 2016-07-15 MED ORDER — LISINOPRIL 20 MG PO TABS: 20.0000 mg | ORAL_TABLET | Freq: Every day | ORAL | 0 refills | Status: DC

## 2016-07-15 NOTE — Patient Instructions (Addendum)
HgbA1C has gone up, he must follow a diabetic diet otherwise we will need to add diabetes medication on his next visit We will also start lisinopril for kidney protection. Monitor for cough and angioedema ( swelling of lips, tongue, throat ).  Please let us know if he is seeing a psychiatrist and if so, I need the name Monitor bp at home to make sure does not go below `120/65 Please resume Diabetic diet 2000 calories since his glucose is going up

## 2016-07-15 NOTE — Progress Notes (Signed)
Name: Wayne Jones   MRN: [id number]    DOB: 11/09/1960   Date:07/15/2016       Progress Note  Subjective  Chief Complaint  Chief Complaint  Patient presents with  . Medication Refill    6 month F/U, Lives at [facility]  . Diabetes  . Hypertension  . Hyperlipidemia  . Gastroesophageal Reflux    HPI  DMII with microalbuminuria: he is not on ACE, and we will try to start it today, he lives in a group home and advised to monitor bp for the next few weeks. He likes to eat and the home has not been very strict wit his diet. His hgbA1C has gone up, but still under control. He is on statin therapy and aspirin.  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   HTN: on beta-blocker only, and we will add Ace today, discussed possible side effects with caregiver Elnoria Howard )  BPH: he sees Dr. Jacqlyn Larsen, and is taking medication to control symptoms.   MR: lives in a group, under [employer] care, stable behavior, no wandering or aggression  Bipolar disorder: taking medication, caregiver is not sure if he sees psychiatrist, but behavior has been stable, does not seem to be depressed or manic  Edema Lower extremities: he has chronic lower extremity edema, takes diuretics and potassium, he denies leg pain. Discussed leg elevation, compression stocking hoses   Patient Active Problem List   Diagnosis Date Noted  . MR (mental retardation) 03/17/2016  . Polypharmacy 01/10/2016  . Benign prostatic hypertrophy without urinary obstruction 01/09/2015  . Chronic kidney disease (CKD), stage I 01/09/2015  . Cognitive decline 01/09/2015  . Controlled type 2 diabetes mellitus with microalbuminuria ([place]) 01/09/2015  . Acid reflux 01/09/2015  . Hearing loss 01/09/2015  . HLD  (hyperlipidemia) 01/09/2015  . Extreme obesity ([place]) 01/09/2015  . Obstructive sleep apnea 01/09/2015  . Other specified causes of urethral stricture 01/09/2015  . Hernia of anterior abdominal wall 01/09/2015  . Difficult or painful urination 07/09/2012  . Incomplete bladder emptying 07/09/2012  . 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 Benign essential HTN 02/04/2007  . Bipolar I disorder, single manic episode, moderate ([place]) 02/04/2007    Past Surgical History:  Procedure Laterality Date  . LAPAROSCOPIC CHOLECYSTECTOMY    . LAPAROSCOPY  12/03/2010  . VENTRAL HERNIA REPAIR      Family History  Problem Relation Age of Onset  . Family history unknown: Yes    Social History   Social History  . Marital status: Single    Spouse name: N/A  . Number of children: N/A  . Years of education: N/A   Occupational History  . Not on file.   Social History Main Topics  . Smoking status: Never Smoker  . Smokeless tobacco: Never Used  . Alcohol use No  . Drug use: No  . Sexual activity: No   Other Topics Concern  . Not on file   Social History Narrative  . No narrative on file     Current Outpatient Prescriptions:  .  ARIPiprazole (ABILIFY) 20 MG tablet, Take 20 mg by mouth daily., Disp: , Rfl:  .  aspirin 81 MG tablet, Take 1 tablet (81 mg total) by mouth daily., Disp: 30  tablet, Rfl: 12 .  carbamazepine (EQUETRO) 200 MG CP12 12 hr capsule, Take 1 capsule (200 mg total) by mouth 2 (two) times daily., Disp: 180 each, Rfl: 3 .  carbamide peroxide (DEBROX) 6.5 % otic solution, 5 drops 2 (two) times daily., Disp: , Rfl:  .  chlorhexidine (PERIDEX) 0.12 % solution, Use as directed 15 mLs in the mouth or throat 2 (two) times daily., Disp: , Rfl:  .  clonazePAM (KLONOPIN) 1 MG tablet, Take 1 mg by mouth daily., Disp: , Rfl:  .  diphenhydrAMINE (SOMINEX) 25 MG tablet, Take 25 mg by mouth at bedtime as needed for sleep., Disp: , Rfl:  .  finasteride (PROSCAR) 5 MG tablet, Take 5 mg by  mouth daily., Disp: , Rfl:  .  FLUoxetine (PROZAC) 20 MG capsule, Take 1 capsule (20 mg total) by mouth daily., Disp: 90 capsule, Rfl: 3 .  gabapentin (NEURONTIN) 600 MG tablet, Take 600 mg by mouth at bedtime., Disp: , Rfl:  .  guaiFENesin-dextromethorphan (ROBITUSSIN DM) 100-10 MG/5ML syrup, Take 5 mLs by mouth every 4 (four) hours as needed for cough., Disp: , Rfl:  .  ibuprofen (ADVIL,MOTRIN) 800 MG tablet, Take 800 mg by mouth every 8 (eight) hours as needed., Disp: , Rfl:  .  loratadine (CLARITIN) 10 MG tablet, TAKE (1) TABLET BY MOUTH DAILY FOR ALLERGY., Disp: 30 tablet, Rfl: 5 .  nystatin (MYCOSTATIN/NYSTOP) powder, APPLY 3 TIMES A DAY, Disp: 30 g, Rfl: 2 .  omega-3 acid ethyl esters (LOVAZA) 1 g capsule, Take 1 capsule (1 g total) by mouth 2 (two) times daily., Disp: 180 capsule, Rfl: 5 .  omeprazole (PRILOSEC) 20 MG capsule, TAKE 1 CAPSULE BY MOUTH ONCE A DAY.. (G.E.R.D.) DO NOT CRUSH, Disp: 90 capsule, Rfl: 1 .  potassium chloride SA (K-DUR,KLOR-CON) 20 MEQ tablet, Take 1 tablet (20 mEq total) by mouth 2 (two) times daily., Disp: 60 tablet, Rfl: 5 .  propranolol ER (INDERAL LA) 60 MG 24 hr capsule, Take 1 capsule (60 mg total) by mouth daily., Disp: 90 capsule, Rfl: 3 .  ranitidine (ZANTAC) 150 MG tablet, Take 1 tablet (150 mg total) by mouth at bedtime., Disp: 90 tablet, Rfl: 3 .  tamsulosin (FLOMAX) 0.4 MG CAPS capsule, Take 0.4 mg by mouth daily after supper., Disp: , Rfl:  .  torsemide (DEMADEX) 20 MG tablet, TAKE ONE TABLET BY MOUTH ONCE DAILY. (EDEMA/DIURETIC), Disp: 30 tablet, Rfl: 5 .  TRUETRACK TEST test strip, FINGERSTICK BLOOD SUGAR TEST ONCE DAILY., Disp: 100 each, Rfl: 0 .  atorvastatin (LIPITOR) 10 MG tablet, Take 1 tablet (10 mg total) by mouth once., Disp: 30 tablet, Rfl: 5 .  lisinopril (PRINIVIL,ZESTRIL) 20 MG tablet, Take 1 tablet (20 mg total) by mouth daily., Disp: 90 tablet, Rfl: 0  No Known Allergies   ROS  Constitutional: Negative for fever or weight change.   Respiratory: Negative for cough and shortness of breath.   Cardiovascular: Negative for chest pain or palpitations.  Gastrointestinal: Negative for abdominal pain, no bowel changes.  Musculoskeletal: Negative for gait problem or joint swelling.  Skin: Positive for rash.  Neurological: Negative for dizziness or headache.  No other specific complaints in a complete review of systems (except as listed in HPI above).  Objective  Vitals:   07/15/16 0937  BP: 122/78  Pulse: 63  Resp: 16  Temp: 97.6 F (36.4 C)  TempSrc: Oral  SpO2: 95%  Weight: 281 lb 4.8 oz (127.6 kg)  Height: 6' (1.829 m)  Body mass index is 38.15 kg/m.  Physical Exam  Constitutional: Patient appears well-developed and well-nourished. Obese  No distress.  HEENT: head atraumatic, normocephalic, pupils equal and reactive to light,neck supple, throat within normal limits Cardiovascular: Normal rate, regular rhythm and normal heart sounds.  No murmur heard. Positive for 1-2 plus  BLE edema. Pulmonary/Chest: Effort normal and breath sounds normal. No respiratory distress. Abdominal: Soft.  There is no tenderness. Psychiatric: Patient did not talk, good eye contact and cooperative Skin some excoriation and lichenification anterior tibia  Recent Results (from the past 2160 hour(s))  POCT HgB A1C     Status: None   Collection Time: 07/15/16  9:42 AM  Result Value Ref Range   Hemoglobin A1C 6.7      PHQ2/9: Depression screen PHQ 2/9 03/14/2015  Decreased Interest 0  Down, Depressed, Hopeless 1  PHQ - 2 Score 1     Fall Risk: Fall Risk  07/15/2016 03/14/2015 03/14/2015  Falls in the past year? No No No      Functional Status Survey: Is the patient deaf or have difficulty hearing?: No Does the patient have difficulty seeing, even when wearing glasses/contacts?: No Does the patient have difficulty concentrating, remembering, or making decisions?: Yes Does the patient have difficulty walking or climbing  stairs?: No Does the patient have difficulty dressing or bathing?: No Does the patient have difficulty doing errands alone such as visiting a doctor's office or shopping?: Yes (Does not drive)    Assessment & Plan  1. Controlled type 2 diabetes mellitus with microalbuminuria, without long-term current use of insulin (HCC)  Resume diabetic diet, add lisinopril and monitor bp  - POCT HgB A1C - lisinopril (PRINIVIL,ZESTRIL) 20 MG tablet; Take 1 tablet (20 mg total) by mouth daily.  Dispense: 90 tablet; Refill: 0  2. MR (mental retardation)  Stable, lives at [facility]  3. Benign essential HTN  - CBC with Differential/Platelet - COMPLETE METABOLIC PANEL WITH GFR  4. Morbid obesity, unspecified obesity type ([place])  Discussed with the patient the risk posed by an increased BMI. Discussed importance of portion control, calorie counting and at least 150 minutes of physical activity weekly. Avoid sweet beverages and drink more water. Eat at least 6 servings of fruit and vegetables daily   5. Bipolar I disorder, single manic episode, moderate (HCC)  - Carbamazepine level, total  6. Mixed hyperlipidemia  - Lipid panel  7. Gastroesophageal reflux disease, esophagitis presence not specified  - omeprazole (PRILOSEC) 20 MG capsule; TAKE 1 CAPSULE BY MOUTH ONCE A DAY.. (G.E.R.D.) DO NOT CRUSH  Dispense: 90 capsule; Refill: 1  8. Long-term use of high-risk medication  - CBC with Differential/Platelet - COMPLETE METABOLIC PANEL WITH GFR - Carbamazepine level, total

## 2016-07-17 ENCOUNTER — Other Ambulatory Visit: Payer: Self-pay

## 2016-07-17 MED ORDER — LORATADINE 10 MG PO TABS: ORAL_TABLET | ORAL | 5 refills | Status: DC

## 2016-07-17 MED ORDER — TORSEMIDE 20 MG PO TABS: ORAL_TABLET | ORAL | 5 refills | Status: DC

## 2016-07-17 MED ORDER — ATORVASTATIN CALCIUM 10 MG PO TABS: 10.0000 mg | ORAL_TABLET | Freq: Once | ORAL | 5 refills | Status: DC

## 2016-07-17 MED ORDER — POTASSIUM CHLORIDE CRYS ER 20 MEQ PO TBCR: 20.0000 meq | EXTENDED_RELEASE_TABLET | Freq: Two times a day (BID) | ORAL | 5 refills | Status: DC

## 2016-07-17 NOTE — Telephone Encounter (Addendum)
Patient requesting refill of Torsemide, Loratadine, Potassium, and Atorvastatin to [facility].

## 2016-07-18 DIAGNOSIS — E782 Mixed hyperlipidemia: Secondary | ICD-10-CM | POA: Diagnosis not present

## 2016-07-18 DIAGNOSIS — F3012 Manic episode without psychotic symptoms, moderate: Secondary | ICD-10-CM | POA: Diagnosis not present

## 2016-07-18 DIAGNOSIS — I10 Essential (primary) hypertension: Secondary | ICD-10-CM | POA: Diagnosis not present

## 2016-07-18 DIAGNOSIS — Z79899 Other long term (current) drug therapy: Secondary | ICD-10-CM | POA: Diagnosis not present

## 2016-07-18 LAB — CBC WITH DIFFERENTIAL/PLATELET
BASOS PCT: 0 %
Basophils Absolute: 0 cells/uL (ref 0–200)
Eosinophils Absolute: 142 cells/uL (ref 15–500)
Eosinophils Relative: 2 %
HCT: 44.7 % (ref 38.5–50.0)
HEMOGLOBIN: 15.4 g/dL (ref 13.2–17.1)
LYMPHS ABS: 1349 {cells}/uL (ref 850–3900)
Lymphocytes Relative: 19 %
MCH: 32.1 pg (ref 27.0–33.0)
MCHC: 34.5 g/dL (ref 32.0–36.0)
MCV: 93.1 fL (ref 80.0–100.0)
MONOS PCT: 8 %
MPV: 11 fL (ref 7.5–12.5)
Monocytes Absolute: 568 cells/uL (ref 200–950)
NEUTROS ABS: 5041 {cells}/uL (ref 1500–7800)
Neutrophils Relative %: 71 %
PLATELETS: 113 10*3/uL — AB (ref 140–400)
RBC: 4.8 MIL/uL (ref 4.20–5.80)
RDW: 13.5 % (ref 11.0–15.0)
WBC: 7.1 10*3/uL (ref 3.8–10.8)

## 2016-07-19 LAB — COMPLETE METABOLIC PANEL WITH GFR
ALBUMIN: 4.1 g/dL (ref 3.6–5.1)
ALT: 21 U/L (ref 9–46)
AST: 16 U/L (ref 10–35)
Alkaline Phosphatase: 117 U/L — ABNORMAL HIGH (ref 40–115)
BILIRUBIN TOTAL: 0.4 mg/dL (ref 0.2–1.2)
BUN: 15 mg/dL (ref 7–25)
CO2: 28 mmol/L (ref 20–31)
CREATININE: 0.99 mg/dL (ref 0.70–1.33)
Calcium: 8.7 mg/dL (ref 8.6–10.3)
Chloride: 101 mmol/L (ref 98–110)
GFR, Est African American: >89 mL/min (ref >=60)
GFR, Est Non African American: 85 mL/min (ref >=60)
GLUCOSE: 159 mg/dL — AB (ref 65–99)
Potassium: 4.3 mmol/L (ref 3.5–5.3)
SODIUM: 141 mmol/L (ref 135–146)
Total Protein: 6.6 g/dL (ref 6.1–8.1)

## 2016-07-19 LAB — LIPID PANEL
Cholesterol: 134 mg/dL (ref <200)
HDL: 43 mg/dL (ref >40)
LDL CALC: 71 mg/dL (ref <100)
Total CHOL/HDL Ratio: 3.1 Ratio (ref <5.0)
Triglycerides: 102 mg/dL (ref <150)
VLDL: 20 mg/dL (ref <30)

## 2016-07-19 LAB — CARBAMAZEPINE LEVEL, TOTAL: CARBAMAZEPINE LVL: 7.7 mg/L (ref 4.0–12.0)

## 2016-07-20 ENCOUNTER — Encounter: Payer: Self-pay | Admitting: Family Medicine

## 2016-07-20 DIAGNOSIS — D696 Thrombocytopenia, unspecified: Secondary | ICD-10-CM | POA: Insufficient documentation

## 2016-08-05 ENCOUNTER — Other Ambulatory Visit: Payer: Self-pay

## 2016-08-18 ENCOUNTER — Other Ambulatory Visit: Payer: Self-pay

## 2016-08-18 NOTE — Telephone Encounter (Signed)
Patient requesting refill of Torsemide to [facility].

## 2016-08-19 MED ORDER — TORSEMIDE 20 MG PO TABS: ORAL_TABLET | ORAL | 0 refills | Status: DC

## 2016-09-09 DIAGNOSIS — F71 Moderate intellectual disabilities: Secondary | ICD-10-CM | POA: Diagnosis not present

## 2016-09-22 ENCOUNTER — Other Ambulatory Visit: Payer: Self-pay

## 2016-09-22 ENCOUNTER — Other Ambulatory Visit: Payer: Self-pay | Admitting: Family Medicine

## 2016-09-22 DIAGNOSIS — E1129 Type 2 diabetes mellitus with other diabetic kidney complication: Secondary | ICD-10-CM

## 2016-09-22 DIAGNOSIS — R809 Proteinuria, unspecified: Principal | ICD-10-CM

## 2016-09-22 MED ORDER — CARBAMIDE PEROXIDE 6.5 % OT SOLN: 5.0000 [drp] | Freq: Two times a day (BID) | OTIC | 0 refills | Status: DC

## 2016-09-22 NOTE — Telephone Encounter (Signed)
Patient requesting refill of Ear Wax Drop to [facility].

## 2016-10-05 ENCOUNTER — Emergency Department
Admission: EM | Admit: 2016-10-05 | Discharge: 2016-10-05 | Disposition: A | Discharge status: Home / Self Care | Payer: Medicare Other | Attending: Emergency Medicine | Admitting: Emergency Medicine

## 2016-10-05 ENCOUNTER — Encounter: Payer: Self-pay | Admitting: Emergency Medicine

## 2016-10-05 DIAGNOSIS — R300 Dysuria: Secondary | ICD-10-CM | POA: Diagnosis not present

## 2016-10-05 DIAGNOSIS — N181 Chronic kidney disease, stage 1: Secondary | ICD-10-CM | POA: Diagnosis not present

## 2016-10-05 DIAGNOSIS — Y999 Unspecified external cause status: Secondary | ICD-10-CM | POA: Insufficient documentation

## 2016-10-05 DIAGNOSIS — Z23 Encounter for immunization: Secondary | ICD-10-CM | POA: Diagnosis not present

## 2016-10-05 DIAGNOSIS — I129 Hypertensive chronic kidney disease with stage 1 through stage 4 chronic kidney disease, or unspecified chronic kidney disease: Secondary | ICD-10-CM | POA: Diagnosis not present

## 2016-10-05 DIAGNOSIS — E1122 Type 2 diabetes mellitus with diabetic chronic kidney disease: Secondary | ICD-10-CM | POA: Diagnosis not present

## 2016-10-05 DIAGNOSIS — S40812A Abrasion of left upper arm, initial encounter: Secondary | ICD-10-CM

## 2016-10-05 DIAGNOSIS — Z79899 Other long term (current) drug therapy: Secondary | ICD-10-CM | POA: Insufficient documentation

## 2016-10-05 DIAGNOSIS — Z7982 Long term (current) use of aspirin: Secondary | ICD-10-CM | POA: Insufficient documentation

## 2016-10-05 DIAGNOSIS — S6992XA Unspecified injury of left wrist, hand and finger(s), initial encounter: Secondary | ICD-10-CM | POA: Diagnosis present

## 2016-10-05 DIAGNOSIS — Y92009 Unspecified place in unspecified non-institutional (private) residence as the place of occurrence of the external cause: Secondary | ICD-10-CM | POA: Insufficient documentation

## 2016-10-05 DIAGNOSIS — Y939 Activity, unspecified: Secondary | ICD-10-CM | POA: Insufficient documentation

## 2016-10-05 DIAGNOSIS — W503XXA Accidental bite by another person, initial encounter: Secondary | ICD-10-CM | POA: Diagnosis not present

## 2016-10-05 DIAGNOSIS — Z7984 Long term (current) use of oral hypoglycemic drugs: Secondary | ICD-10-CM | POA: Insufficient documentation

## 2016-10-05 DIAGNOSIS — S41152A Open bite of left upper arm, initial encounter: Secondary | ICD-10-CM | POA: Diagnosis not present

## 2016-10-05 LAB — URINALYSIS, COMPLETE (UACMP) WITH MICROSCOPIC
BACTERIA UA: NONE SEEN
Bilirubin Urine: NEGATIVE
GLUCOSE, UA: NEGATIVE mg/dL
HGB URINE DIPSTICK: NEGATIVE
KETONES UR: NEGATIVE mg/dL
Leukocytes, UA: NEGATIVE
NITRITE: NEGATIVE
Protein, ur: NEGATIVE mg/dL
Specific Gravity, Urine: 1.025 (ref 1.005–1.030)
WBC, UA: NONE SEEN WBC/hpf (ref 0–5)
pH: 5 (ref 5.0–8.0)

## 2016-10-05 MED ORDER — AMOXICILLIN-POT CLAVULANATE 875-125 MG PO TABS: 1.0000 | ORAL_TABLET | Freq: Two times a day (BID) | ORAL | 0 refills | Status: AC

## 2016-10-05 MED ORDER — TETANUS-DIPHTH-ACELL PERTUSSIS 5-2.5-18.5 LF-MCG/0.5 IM SUSP
0.5000 mL | Freq: Once | INTRAMUSCULAR | Status: AC
  Administered 2016-10-05: 0.5 mL via INTRAMUSCULAR
  Filled 2016-10-05: qty 0.5
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
## 2016-10-05 NOTE — ED Notes (Signed)
Patient's caregiver is requesting to be discharged without the urine specimen. PA informed.

## 2016-10-05 NOTE — ED Notes (Signed)
Patient able to provide urine sample. Pt's guardian requesting to be called with urinalysis results. PA informed
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
## 2016-10-05 NOTE — ED Notes (Signed)
Reviewed d/c instructions, follow-up care, wound care, prescription with patient's guardian. Guardian verbalized understanding
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
## 2016-10-07 ENCOUNTER — Other Ambulatory Visit: Payer: Self-pay | Admitting: Family Medicine

## 2016-10-07 DIAGNOSIS — K219 Gastro-esophageal reflux disease without esophagitis: Secondary | ICD-10-CM

## 2016-10-13 ENCOUNTER — Ambulatory Visit (INDEPENDENT_AMBULATORY_CARE_PROVIDER_SITE_OTHER): Payer: Medicare Other | Admitting: Family Medicine

## 2016-10-13 ENCOUNTER — Encounter: Payer: Self-pay | Admitting: Family Medicine

## 2016-10-13 VITALS — BP 104/78 | HR 62 | Temp 98.7°F | Resp 16 | Ht 75.0 in | Wt 279.4 lb

## 2016-10-13 DIAGNOSIS — K219 Gastro-esophageal reflux disease without esophagitis: Secondary | ICD-10-CM | POA: Diagnosis not present

## 2016-10-13 DIAGNOSIS — I10 Essential (primary) hypertension: Secondary | ICD-10-CM

## 2016-10-13 DIAGNOSIS — F3012 Manic episode without psychotic symptoms, moderate: Secondary | ICD-10-CM

## 2016-10-13 DIAGNOSIS — E1129 Type 2 diabetes mellitus with other diabetic kidney complication: Secondary | ICD-10-CM | POA: Diagnosis not present

## 2016-10-13 DIAGNOSIS — G4733 Obstructive sleep apnea (adult) (pediatric): Secondary | ICD-10-CM

## 2016-10-13 DIAGNOSIS — R809 Proteinuria, unspecified: Secondary | ICD-10-CM | POA: Diagnosis not present

## 2016-10-13 DIAGNOSIS — W503XXD Accidental bite by another person, subsequent encounter: Secondary | ICD-10-CM

## 2016-10-13 DIAGNOSIS — S51852D Open bite of left forearm, subsequent encounter: Secondary | ICD-10-CM

## 2016-10-13 DIAGNOSIS — E7800 Pure hypercholesterolemia, unspecified: Secondary | ICD-10-CM | POA: Diagnosis not present

## 2016-10-13 DIAGNOSIS — F79 Unspecified intellectual disabilities: Secondary | ICD-10-CM | POA: Diagnosis not present

## 2016-10-13 LAB — POCT GLYCOSYLATED HEMOGLOBIN (HGB A1C): Hemoglobin A1C: 6.8

## 2016-10-13 MED ORDER — PROPRANOLOL HCL ER 60 MG PO CP24: 60.0000 mg | ORAL_CAPSULE | Freq: Every day | ORAL | 3 refills | Status: DC

## 2016-10-13 MED ORDER — LISINOPRIL 20 MG PO TABS: 20.0000 mg | ORAL_TABLET | Freq: Every day | ORAL | 5 refills | Status: DC

## 2016-10-13 MED ORDER — RANITIDINE HCL 150 MG PO TABS: 150.0000 mg | ORAL_TABLET | Freq: Every day | ORAL | 3 refills | Status: DC

## 2016-10-13 MED ORDER — FLUOXETINE HCL 20 MG PO CAPS: 20.0000 mg | ORAL_CAPSULE | Freq: Every day | ORAL | 3 refills | Status: DC
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
## 2016-10-14 ENCOUNTER — Other Ambulatory Visit: Payer: Self-pay

## 2016-10-14 DIAGNOSIS — I10 Essential (primary) hypertension: Secondary | ICD-10-CM

## 2016-10-15 NOTE — Progress Notes (Unsigned)
Patient receives his Propranolol from Dr. Tamera Punt.

## 2016-11-13 ENCOUNTER — Ambulatory Visit: Payer: Medicare Other | Admitting: Podiatry

## 2016-11-20 ENCOUNTER — Other Ambulatory Visit: Payer: Self-pay | Admitting: Family Medicine

## 2016-11-20 NOTE — Telephone Encounter (Signed)
Patient requesting refill of Ear Drops to [facility].

## 2016-12-01 ENCOUNTER — Ambulatory Visit (INDEPENDENT_AMBULATORY_CARE_PROVIDER_SITE_OTHER): Payer: Medicare Other | Admitting: Podiatry

## 2016-12-01 DIAGNOSIS — B351 Tinea unguium: Secondary | ICD-10-CM | POA: Diagnosis not present

## 2016-12-01 DIAGNOSIS — M79676 Pain in unspecified toe(s): Secondary | ICD-10-CM

## 2016-12-01 NOTE — Progress Notes (Signed)
Complaint:  Visit Type: Patient returns to my office for continued preventative foot care services. Complaint: Patient states" my nails have grown long and thick and become painful to walk and wear shoes" . The patient presents for preventative foot care services. No changes to ROS  Podiatric Exam: Vascular: dorsalis pedis and posterior tibial pulses are palpable bilateral. Capillary return is immediate. Temperature gradient is WNL. Skin turgor WNL  Sensorium: Normal Semmes Weinstein monofilament test. Normal tactile sensation bilaterally. Nail Exam: Pt has thick disfigured discolored nails with subungual debris noted bilateral entire nail hallux through fifth toenails Ulcer Exam: There is no evidence of ulcer or pre-ulcerative changes or infection. Orthopedic Exam: Muscle tone and strength are WNL. No limitations in general ROM. No crepitus or effusions noted. Foot type and digits show no abnormalities. Bony prominences are unremarkable. Skin: No Porokeratosis. No infection or ulcers  Diagnosis:  Onychomycosis, , Pain in right toe, pain in left toes  Treatment & Plan Procedures and Treatment: Consent by patient was obtained for treatment procedures. The patient understood the discussion of treatment and procedures well. All questions were answered thoroughly reviewed. Debridement of mycotic and hypertrophic toenails, 1 through 5 bilateral and clearing of subungual debris. No ulceration, no infection noted.  Return Visit-Office Procedure: Patient instructed to return to the office for a follow up visit 3 months   for continued evaluation and treatment.    Erykah Lippert DPM 

## 2017-01-12 ENCOUNTER — Other Ambulatory Visit: Payer: Self-pay | Admitting: Family Medicine

## 2017-01-19 ENCOUNTER — Ambulatory Visit (INDEPENDENT_AMBULATORY_CARE_PROVIDER_SITE_OTHER): Payer: Medicare Other | Admitting: Family Medicine

## 2017-01-19 ENCOUNTER — Encounter: Payer: Self-pay | Admitting: Family Medicine

## 2017-01-19 VITALS — BP 114/78 | HR 62 | Temp 97.5°F | Resp 16 | Ht 75.0 in | Wt 278.9 lb

## 2017-01-19 DIAGNOSIS — G4733 Obstructive sleep apnea (adult) (pediatric): Secondary | ICD-10-CM | POA: Diagnosis not present

## 2017-01-19 DIAGNOSIS — F3012 Manic episode without psychotic symptoms, moderate: Secondary | ICD-10-CM

## 2017-01-19 DIAGNOSIS — H9213 Otorrhea, bilateral: Secondary | ICD-10-CM | POA: Diagnosis not present

## 2017-01-19 DIAGNOSIS — E782 Mixed hyperlipidemia: Secondary | ICD-10-CM

## 2017-01-19 DIAGNOSIS — I10 Essential (primary) hypertension: Secondary | ICD-10-CM | POA: Diagnosis not present

## 2017-01-19 DIAGNOSIS — F79 Unspecified intellectual disabilities: Secondary | ICD-10-CM

## 2017-01-19 DIAGNOSIS — J3089 Other allergic rhinitis: Secondary | ICD-10-CM

## 2017-01-19 DIAGNOSIS — R600 Localized edema: Secondary | ICD-10-CM | POA: Diagnosis not present

## 2017-01-19 DIAGNOSIS — E1129 Type 2 diabetes mellitus with other diabetic kidney complication: Secondary | ICD-10-CM

## 2017-01-19 DIAGNOSIS — K219 Gastro-esophageal reflux disease without esophagitis: Secondary | ICD-10-CM

## 2017-01-19 DIAGNOSIS — R809 Proteinuria, unspecified: Secondary | ICD-10-CM | POA: Diagnosis not present

## 2017-01-19 LAB — POCT GLYCOSYLATED HEMOGLOBIN (HGB A1C): Hemoglobin A1C: 6.5

## 2017-01-19 MED ORDER — MEDICAL COMPRESSION SOCKS MISC: 2.0000 | Freq: Every day | 1 refills | Status: DC

## 2017-01-19 MED ORDER — LORATADINE 10 MG PO TABS: ORAL_TABLET | ORAL | 5 refills | Status: DC

## 2017-01-19 MED ORDER — OMEGA-3-ACID ETHYL ESTERS 1 G PO CAPS: 1.0000 g | ORAL_CAPSULE | Freq: Two times a day (BID) | ORAL | 5 refills | Status: DC

## 2017-01-19 MED ORDER — ATORVASTATIN CALCIUM 10 MG PO TABS: 10.0000 mg | ORAL_TABLET | Freq: Once | ORAL | 5 refills | Status: DC

## 2017-01-19 MED ORDER — OFLOXACIN 0.3 % OT SOLN: 5.0000 [drp] | Freq: Every day | OTIC | 0 refills | Status: DC

## 2017-01-19 NOTE — Progress Notes (Signed)
Name: Wayne Jones   MRN: [id number]    DOB: November 07, 1960   Date:01/19/2017       Progress Note  Subjective  Chief Complaint  Chief Complaint  Patient presents with  . Medication Refill    3 month F/U  . Diabetes  . Gastroesophageal Reflux  . Hyperlipidemia  . 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 His hgbA1C has improved slightly since last visit, , fasting glucose log was not brought to the clinic today.   He is on statin therapy and aspirin. No symptoms of hypoglycemia. The caregiver states diet has improved  Obesity: weight has been stable,  We ordered a diabetic diet on his last visit.   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   HTN: on beta-blocker and Ace, bp is towards low end of normal, no chest pain but has intermittent palpitation, we will stop diuretic, since he also has BPH, try compression stocking hoses instead of diuretic  BPH: he sees Dr. Jacqlyn Larsen, and is taking medication to control symptoms.   Dyslipidemia: taking statin and Lovaza. We will recheck labs next visit, no side effects  MR: lives in a group, under [employer] care, stable behavior, no wandering or aggression. He had one bowel incontinence last week, no other problems. It only happens is when he refuses to do something.   Bipolar disorder: taking medication, caregiver is not sure if he sees psychiatrist, but behavior has been stable, does not seem to be depressed or manic  Edema Lower extremities: he has chronic lower extremity edema, takes diuretics and potassium, he denies leg pain. Discussed leg elevation, we will stop diuretic, resume compression stocking hose  Ear Drainage: going on for days, no fever, cold symptoms or pain  GERD: well controlled  OSA: he has  been sleeping in the recliner, caregiver states he seems to stay up during the night and sleep at the [facility].   Patient Active Problem List   Diagnosis Date Noted  . MR (mental retardation) 03/17/2016  . Polypharmacy 01/10/2016  . Benign prostatic hypertrophy without urinary obstruction 01/09/2015  . Chronic kidney disease (CKD), stage I 01/09/2015  . Cognitive decline 01/09/2015  . Controlled type 2 diabetes mellitus with microalbuminuria ([place]) 01/09/2015  . Acid reflux 01/09/2015  . Hearing loss 01/09/2015  . HLD (hyperlipidemia) 01/09/2015  . Extreme obesity 01/09/2015  . Obstructive sleep apnea 01/09/2015  . Other specified causes of urethral stricture 01/09/2015  . Hernia of anterior abdominal wall 01/09/2015  . Difficult or painful urination 07/09/2012  . Incomplete bladder emptying 07/09/2012  . 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 Benign essential HTN 02/04/2007  . Bipolar I disorder, single manic episode, moderate ([place]) 02/04/2007    Past Surgical History:  Procedure Laterality Date  . LAPAROSCOPIC CHOLECYSTECTOMY    . LAPAROSCOPY  12/03/2010  . VENTRAL HERNIA REPAIR      Family History  Problem Relation Age of Onset  . Family history unknown: Yes    Social History   Social History  . Marital status: Single    Spouse name: N/A  . Number of children: N/A  . Years of education: N/A   Occupational History  . Not on file.   Social History Main Topics  .  Smoking status: Never Smoker  . Smokeless tobacco: Never Used  . Alcohol use No  . Drug use: No  . Sexual activity: No   Other Topics Concern  . Not on file   Social History Narrative  . No narrative on file     Current Outpatient Prescriptions:  .  ARIPiprazole (ABILIFY) 20 MG tablet, Take 20 mg by mouth daily., Disp: , Rfl:  .  aspirin 81 MG tablet, Take 1 tablet (81 mg total) by mouth daily., Disp: 30 tablet, Rfl: 12 .  carbamazepine (EQUETRO) 200 MG CP12 12 hr capsule, Take 1 capsule (200  mg total) by mouth 2 (two) times daily., Disp: 180 each, Rfl: 3 .  chlorhexidine (PERIDEX) 0.12 % solution, Use as directed 15 mLs in the mouth or throat 2 (two) times daily., Disp: , Rfl:  .  clonazePAM (KLONOPIN) 1 MG tablet, Take 1 mg by mouth daily., Disp: , Rfl:  .  diphenhydrAMINE (SOMINEX) 25 MG tablet, Take 25 mg by mouth at bedtime as needed for sleep., Disp: , Rfl:  .  EAR DROPS 6.5 % otic solution, PLACE 5 DROPS INTO BOTH EARS TWO TIMES A DAY, Disp: 15 mL, Rfl: 10 .  finasteride (PROSCAR) 5 MG tablet, Take 5 mg by mouth daily., Disp: , Rfl:  .  FLUoxetine (PROZAC) 20 MG capsule, Take 1 capsule (20 mg total) by mouth daily., Disp: 90 capsule, Rfl: 3 .  gabapentin (NEURONTIN) 600 MG tablet, Take 600 mg by mouth at bedtime., Disp: , Rfl:  .  guaiFENesin-dextromethorphan (ROBITUSSIN DM) 100-10 MG/5ML syrup, Take 5 mLs by mouth every 4 (four) hours as needed for cough., Disp: , Rfl:  .  ibuprofen (ADVIL,MOTRIN) 800 MG tablet, Take 800 mg by mouth every 8 (eight) hours as needed., Disp: , Rfl:  .  lisinopril (PRINIVIL,ZESTRIL) 20 MG tablet, Take 1 tablet (20 mg total) by mouth daily., Disp: 30 tablet, Rfl: 5 .  loratadine (CLARITIN) 10 MG tablet, TAKE (1) TABLET BY MOUTH DAILY FOR ALLERGY., Disp: 30 tablet, Rfl: 5 .  nystatin (MYCOSTATIN/NYSTOP) powder, APPLY 3 TIMES A DAY, Disp: 30 g, Rfl: 2 .  omega-3 acid ethyl esters (LOVAZA) 1 g capsule, Take 1 capsule (1 g total) by mouth 2 (two) times daily., Disp: 180 capsule, Rfl: 5 .  omeprazole (PRILOSEC) 20 MG capsule, TAKE 1 CAPSULE BY MOUTH ONCE A DAY.. (G.E.R.D.) DO NOT CRUSH, Disp: 30 capsule, Rfl: 4 .  propranolol (INDERAL) 60 MG tablet, Take 60 mg by mouth 3 (three) times daily., Disp: , Rfl:  .  propranolol ER (INDERAL LA) 60 MG 24 hr capsule, Take 1 capsule (60 mg total) by mouth daily. (Patient taking differently: Take 60 mg by mouth 2 (two) times daily. ), Disp: 90 capsule, Rfl: 3 .  ranitidine (ZANTAC) 150 MG tablet, Take 1 tablet (150 mg  total) by mouth at bedtime., Disp: 90 tablet, Rfl: 3 .  tamsulosin (FLOMAX) 0.4 MG CAPS capsule, Take 0.4 mg by mouth daily after supper., Disp: , Rfl:  .  TRUETRACK TEST test strip, FINGERSTICK BLOOD SUGAR TEST ONCE DAILY., Disp: 100 each, Rfl: 0 .  atorvastatin (LIPITOR) 10 MG tablet, Take 1 tablet (10 mg total) by mouth once., Disp: 30 tablet, Rfl: 5 .  Elastic Bandages & Supports (MEDICAL COMPRESSION SOCKS) MISC, 2 each by Does not apply route daily. Apply in am's and remove it at bedtime, Disp: 2 each, Rfl: 1  No Known Allergies   ROS  Ten systems reviewed and is negative  except as mentioned in HPI ( with the help with caregiver. He is here today with Mr. Therisa Doyne)   Objective  Vitals:   01/19/17 1323  BP: 114/78  Pulse: 62  Resp: 16  Temp: 97.5 F (36.4 C)  TempSrc: Oral  SpO2: 96%  Weight: 278 lb 14.4 oz (126.5 kg)  Height: 6\' 3"  (1.905 m)    Body mass index is 34.86 kg/m.  Physical Exam  Constitutional: Patient appears well-developed and well-nourished. Obese No distress.  HEENT: head atraumatic, normocephalic, pupils equal and reactive to light, ears bilateral white drainage, difficulty to see middle ear, does not seem to be painful,  neck supple, throat within normal limits Cardiovascular: Normal rate, regular rhythm and normal heart sounds.  No murmur heard. 1 plus  BLE edema. Pulmonary/Chest: Effort normal and breath sounds normal. No respiratory distress. Abdominal: Soft.  There is no tenderness. Psychiatric: cooperative, some apathy noticed, stable MR  Recent Results (from the past 2160 hour(s))  POCT HgB A1C     Status: None   Collection Time: 01/19/17  1:27 PM  Result Value Ref Range   Hemoglobin A1C 6.5     PHQ2/9: Depression screen [facility] 2/9 01/19/2017 03/14/2015  Decreased Interest 0 0  Down, Depressed, Hopeless 0 1  PHQ - 2 Score 0 1    Fall Risk: Fall Risk  01/19/2017 07/15/2016 03/14/2015 03/14/2015  Falls in the past year? No No No No     Functional Status Survey: Is the patient deaf or have difficulty hearing?: No Does the patient have difficulty seeing, even when wearing glasses/contacts?: No Does the patient have difficulty concentrating, remembering, or making decisions?: Yes Does the patient have difficulty walking or climbing stairs?: No Does the patient have difficulty dressing or bathing?: No Does the patient have difficulty doing errands alone such as visiting a doctor's office or shopping?: Yes    Assessment & Plan  1. Controlled type 2 diabetes mellitus with microalbuminuria, without long-term current use of insulin (HCC)  controlled - POCT HgB A1C  2. MR (mental retardation)  stable  3. Benign essential HTN  bp towards low end of normal, we will stop Demadex  4. Morbid obesity, unspecified obesity type [facility])  Discussed with the patient the risk posed by an increased BMI. Discussed importance of portion control, calorie counting and at least 150 minutes of physical activity weekly. Avoid sweet beverages and drink more water. Eat at least 6 servings of fruit and vegetables daily   5. Bipolar I disorder, single manic episode, moderate (HCC)  Continue follow up with Dr. Tamera Punt  6. Obstructive sleep apnea  Not compliant with CPAP   7. Gastroesophageal reflux disease without esophagitis  Controlled  8. Mixed hyperlipidemia  - omega-3 acid ethyl esters (LOVAZA) 1 g capsule; Take 1 capsule (1 g total) by mouth 2 (two) times daily.  Dispense: 180 capsule; Refill: 5 - atorvastatin (LIPITOR) 10 MG tablet; Take 1 tablet (10 mg total) by mouth once.  Dispense: 30 tablet; Refill: 5  9. Perennial allergic rhinitis  - loratadine (CLARITIN) 10 MG tablet; TAKE (1) TABLET BY MOUTH DAILY FOR ALLERGY.  Dispense: 30 tablet; Refill: 5  10. Edema of both legs  - Elastic Bandages & Supports (MEDICAL COMPRESSION SOCKS) MISC; 2 each by Does not apply route daily. Apply in am's and remove it at bedtime   Dispense: 2 each; Refill: 1  11. Ear drainage, bilateral  Likely otitis externa, we will try topical medication, call back for referral to  ENT if no resolution, lots of drainage, unable to see middle ear.  -ear culture

## 2017-01-22 LAB — EAR CULTURE: ORGANISM ID, BACTERIA: NO GROWTH

## 2017-02-09 ENCOUNTER — Telehealth: Payer: Self-pay | Admitting: Family Medicine

## 2017-02-09 NOTE — Telephone Encounter (Signed)
ERRENOUS °

## 2017-02-10 DIAGNOSIS — L0390 Cellulitis, unspecified: Secondary | ICD-10-CM | POA: Diagnosis not present

## 2017-02-10 DIAGNOSIS — E1359 Other specified diabetes mellitus with other circulatory complications: Secondary | ICD-10-CM | POA: Diagnosis not present

## 2017-02-11 ENCOUNTER — Encounter: Payer: Self-pay | Admitting: Family Medicine

## 2017-02-11 ENCOUNTER — Ambulatory Visit (INDEPENDENT_AMBULATORY_CARE_PROVIDER_SITE_OTHER): Payer: Medicare Other | Admitting: Family Medicine

## 2017-02-11 VITALS — BP 122/84 | HR 66 | Temp 98.2°F | Resp 18 | Ht 75.0 in | Wt 287.5 lb

## 2017-02-11 DIAGNOSIS — L03115 Cellulitis of right lower limb: Secondary | ICD-10-CM

## 2017-02-11 DIAGNOSIS — R238 Other skin changes: Secondary | ICD-10-CM

## 2017-02-11 MED ORDER — CLINDAMYCIN HCL 300 MG PO CAPS: 300.0000 mg | ORAL_CAPSULE | Freq: Four times a day (QID) | ORAL | 0 refills | Status: AC

## 2017-02-11 NOTE — Progress Notes (Addendum)
Name: Wayne Jones   MRN: [id number]    DOB: 01/14/1961   Date:02/11/2017       Progress Note  Subjective  Chief Complaint  Chief Complaint  Patient presents with  . Cellulitis    blister on right leg seen at [facility]    HPI Pt has MR and lives in a group home, he is under the care of [name] and is accompanied by Therisa Doyne today who provides much of the history.   Pt does have a history of Type 2 DM, and BLE edema with HTN. BP is at goal today, and A1C was 6.5% on 01/19/2017.   Pt presents with 2 day history of blister on right shin. Was seen at [facility] yesterday and was told to monitor it. Today is got a lot bigger and continues to be painful. No recent injury or burn. No fevers/chills, NVD, fatigue or behavior changes.  Patient Active Problem List   Diagnosis Date Noted  . MR (mental retardation) 03/17/2016  . Polypharmacy 01/10/2016  . Benign prostatic hypertrophy without urinary obstruction 01/09/2015  . Chronic kidney disease (CKD), stage I 01/09/2015  . Cognitive decline 01/09/2015  . Controlled type 2 diabetes mellitus with microalbuminuria ([place]) 01/09/2015  . Acid reflux 01/09/2015  . Hearing loss 01/09/2015  . HLD (hyperlipidemia) 01/09/2015  . Extreme obesity 01/09/2015  . Obstructive sleep apnea 01/09/2015  . Other specified causes of urethral stricture 01/09/2015  . Hernia of anterior abdominal wall 01/09/2015  . Difficult or painful urination 07/09/2012  . Incomplete bladder emptying 07/09/2012  . 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 Benign essential HTN 02/04/2007  . Bipolar I disorder, single manic episode, moderate ([place]) 02/04/2007    Social History  Substance Use Topics  . Smoking status: Never Smoker  . Smokeless tobacco: Never Used  . Alcohol use No     Current Outpatient Prescriptions:  .  ARIPiprazole (ABILIFY) 20 MG tablet, Take 20 mg by mouth daily., Disp: , Rfl:  .  aspirin 81 MG tablet, Take 1 tablet (81 mg total) by  mouth daily., Disp: 30 tablet, Rfl: 12 .  carbamazepine (EQUETRO) 200 MG CP12 12 hr capsule, Take 1 capsule (200 mg total) by mouth 2 (two) times daily., Disp: 180 each, Rfl: 3 .  chlorhexidine (PERIDEX) 0.12 % solution, Use as directed 15 mLs in the mouth or throat 2 (two) times daily., Disp: , Rfl:  .  clonazePAM (KLONOPIN) 1 MG tablet, Take 1 mg by mouth daily., Disp: , Rfl:  .  diphenhydrAMINE (SOMINEX) 25 MG tablet, Take 25 mg by mouth at bedtime as needed for sleep., Disp: , Rfl:  .  EAR DROPS 6.5 % otic solution, PLACE 5 DROPS INTO BOTH EARS TWO TIMES A DAY, Disp: 15 mL, Rfl: 10 .  Elastic Bandages & Supports (MEDICAL COMPRESSION SOCKS) MISC, 2 each by Does not apply route daily. Apply in am's and remove it at bedtime, Disp: 2 each, Rfl: 1 .  finasteride (PROSCAR) 5 MG tablet, Take 5 mg by mouth daily., Disp: , Rfl:  .  FLUoxetine (PROZAC) 20 MG capsule, Take 1 capsule (20 mg total) by mouth daily., Disp: 90 capsule, Rfl: 3 .  gabapentin (NEURONTIN) 600 MG tablet, Take 600 mg by mouth at bedtime., Disp: , Rfl:  .  guaiFENesin-dextromethorphan (ROBITUSSIN DM) 100-10 MG/5ML syrup, Take 5 mLs by mouth every 4 (four) hours as needed for cough., Disp: , Rfl:  .  ibuprofen (ADVIL,MOTRIN) 800 MG tablet, Take 800  mg by mouth every 8 (eight) hours as needed., Disp: , Rfl:  .  lisinopril (PRINIVIL,ZESTRIL) 20 MG tablet, Take 1 tablet (20 mg total) by mouth daily., Disp: 30 tablet, Rfl: 5 .  loratadine (CLARITIN) 10 MG tablet, TAKE (1) TABLET BY MOUTH DAILY FOR ALLERGY., Disp: 30 tablet, Rfl: 5 .  nystatin (MYCOSTATIN/NYSTOP) powder, APPLY 3 TIMES A DAY, Disp: 30 g, Rfl: 2 .  ofloxacin (FLOXIN OTIC) 0.3 % OTIC solution, Place 5 drops into both ears daily., Disp: 10 mL, Rfl: 0 .  omega-3 acid ethyl esters (LOVAZA) 1 g capsule, Take 1 capsule (1 g total) by mouth 2 (two) times daily., Disp: 180 capsule, Rfl: 5 .  omeprazole (PRILOSEC) 20 MG capsule, TAKE 1 CAPSULE BY MOUTH ONCE A DAY.. (G.E.R.D.) DO NOT  CRUSH, Disp: 30 capsule, Rfl: 4 .  propranolol ER (INDERAL LA) 60 MG 24 hr capsule, Take 1 capsule (60 mg total) by mouth daily. (Patient taking differently: Take 60 mg by mouth 2 (two) times daily. ), Disp: 90 capsule, Rfl: 3 .  ranitidine (ZANTAC) 150 MG tablet, Take 1 tablet (150 mg total) by mouth at bedtime., Disp: 90 tablet, Rfl: 3 .  tamsulosin (FLOMAX) 0.4 MG CAPS capsule, Take 0.4 mg by mouth daily after supper., Disp: , Rfl:  .  TRUETRACK TEST test strip, FINGERSTICK BLOOD SUGAR TEST ONCE DAILY., Disp: 100 each, Rfl: 0 .  atorvastatin (LIPITOR) 10 MG tablet, Take 1 tablet (10 mg total) by mouth once., Disp: 30 tablet, Rfl: 5  No Known Allergies  ROS Constitutional: Negative for fever or weight change.  Respiratory: Negative for cough and shortness of breath.   Cardiovascular: Negative for chest pain or palpitations.  Gastrointestinal: Negative for abdominal pain, no bowel changes.  Musculoskeletal: Negative for gait problem or joint swelling.  Skin: Negative for rash.  Positive for blister. Neurological: Negative for dizziness or headache.  No other specific complaints in a complete review of systems (except as listed in HPI above).  Objective  Vitals:   02/11/17 1534  BP: 122/84  Pulse: 66  Resp: 18  Temp: 98.2 F (36.8 C)  TempSrc: Oral  SpO2: 98%  Weight: 287 lb 8 oz (130.4 kg)  Height: 6\' 3"  (1.905 m)   Body mass index is 35.94 kg/m.  Nursing Note and Vital Signs reviewed.  Physical Exam  Constitutional: Patient appears well-developed and well-nourished. ObeseNo distress.  HEENT: head atraumatic, normocephalic Cardiovascular: Normal rate, regular rhythm, S1/S2 present.  No murmur or rub heard. +2 BLE edema. Pulmonary/Chest: Effort normal and breath sounds clear. No respiratory distress or retractions. Psychiatric: Patient has a baseline mood and affect. behavior is baseline. Judgment and thought content baseline. Skin: Multiple scars to BLE; Large bulla to  right shin, tender on palpation, golden/brown and opaque; no bleeding or exudate, mild surrounding erythema that is non-tender.  Recent Results (from the past 2160 hour(s))  POCT HgB A1C     Status: None   Collection Time: 01/19/17  1:27 PM  Result Value Ref Range   Hemoglobin A1C 6.5   Ear Culture     Status: None   Collection Time: 01/19/17  2:21 PM  Result Value Ref Range   Organism ID, Bacteria NO GROWTH 2 DAYS    Assessment & Plan 1. Skin bulla - clindamycin (CLEOCIN) 300 MG capsule; Take 1 capsule (300 mg total) by mouth 4 (four) times daily.  Dispense: 28 capsule; Refill: 0 - Ambulatory referral to [place] contacted  at [place], [name], asks to be contacted by [facility] for scheduling or any addition information needs at [phone]. [phone]; [facility] made aware of this information.  2. Cellulitis of right lower extremity - clindamycin (CLEOCIN) 300 MG capsule; Take 1 capsule (300 mg total) by mouth 4 (four) times daily.  Dispense: 28 capsule; Refill: 0 - Ambulatory referral to [facility] - Due to risk for impaired wound healing and size of wound/bulla, we will refer to home health for further care and monitoring - Dry gauze loosely applied to area for protection, discussed likelihood that this will break open, and what to do in the even that this does occur.  -Red flags and when to present for emergency care or RTC including fever >101.42F, chest pain, shortness of breath, new/worsening/un-resolving symptoms, fatigue or behavior change, red streaking, increase in pain/swelling/redness reviewed with patient at time of visit. Follow up and care instructions discussed and provided in AVS.  I have reviewed this encounter including the documentation in this note and/or discussed this patient with the Johney Maine, FNP, NP-C. I am certifying that I agree with the content of this note as supervising  physician.  Steele Sizer, MD [facility] 02/11/2017, 5:02 PM

## 2017-02-13 DIAGNOSIS — F3012 Manic episode without psychotic symptoms, moderate: Secondary | ICD-10-CM | POA: Diagnosis not present

## 2017-02-13 DIAGNOSIS — I129 Hypertensive chronic kidney disease with stage 1 through stage 4 chronic kidney disease, or unspecified chronic kidney disease: Secondary | ICD-10-CM | POA: Diagnosis not present

## 2017-02-13 DIAGNOSIS — G4733 Obstructive sleep apnea (adult) (pediatric): Secondary | ICD-10-CM | POA: Diagnosis not present

## 2017-02-13 DIAGNOSIS — E1122 Type 2 diabetes mellitus with diabetic chronic kidney disease: Secondary | ICD-10-CM | POA: Diagnosis not present

## 2017-02-13 DIAGNOSIS — S80821D Blister (nonthermal), right lower leg, subsequent encounter: Secondary | ICD-10-CM | POA: Diagnosis not present

## 2017-02-13 DIAGNOSIS — H9190 Unspecified hearing loss, unspecified ear: Secondary | ICD-10-CM | POA: Diagnosis not present

## 2017-02-13 DIAGNOSIS — K219 Gastro-esophageal reflux disease without esophagitis: Secondary | ICD-10-CM | POA: Diagnosis not present

## 2017-02-13 DIAGNOSIS — N181 Chronic kidney disease, stage 1: Secondary | ICD-10-CM | POA: Diagnosis not present

## 2017-02-13 DIAGNOSIS — Z792 Long term (current) use of antibiotics: Secondary | ICD-10-CM | POA: Diagnosis not present

## 2017-02-13 DIAGNOSIS — N400 Enlarged prostate without lower urinary tract symptoms: Secondary | ICD-10-CM | POA: Diagnosis not present

## 2017-02-13 DIAGNOSIS — L03115 Cellulitis of right lower limb: Secondary | ICD-10-CM | POA: Diagnosis not present

## 2017-02-13 DIAGNOSIS — E785 Hyperlipidemia, unspecified: Secondary | ICD-10-CM | POA: Diagnosis not present

## 2017-02-13 DIAGNOSIS — Z9181 History of falling: Secondary | ICD-10-CM | POA: Diagnosis not present

## 2017-02-13 DIAGNOSIS — E668 Other obesity: Secondary | ICD-10-CM | POA: Diagnosis not present

## 2017-02-16 ENCOUNTER — Other Ambulatory Visit: Payer: Self-pay

## 2017-02-16 DIAGNOSIS — J3089 Other allergic rhinitis: Secondary | ICD-10-CM

## 2017-02-16 DIAGNOSIS — E782 Mixed hyperlipidemia: Secondary | ICD-10-CM

## 2017-02-16 MED ORDER — ASPIRIN 81 MG PO TABS: 81.0000 mg | ORAL_TABLET | Freq: Every day | ORAL | 12 refills | Status: DC

## 2017-02-16 MED ORDER — ATORVASTATIN CALCIUM 10 MG PO TABS: 10.0000 mg | ORAL_TABLET | Freq: Once | ORAL | 5 refills | Status: DC

## 2017-02-16 MED ORDER — LORATADINE 10 MG PO TABS: ORAL_TABLET | ORAL | 5 refills | Status: DC

## 2017-02-16 NOTE — Telephone Encounter (Signed)
Pharmacy states they never received these that were phone in last month, to please send them electronically.

## 2017-02-18 ENCOUNTER — Encounter: Payer: Self-pay | Admitting: Family Medicine

## 2017-02-18 ENCOUNTER — Ambulatory Visit (INDEPENDENT_AMBULATORY_CARE_PROVIDER_SITE_OTHER): Payer: Medicare Other | Admitting: Family Medicine

## 2017-02-18 VITALS — BP 120/76 | HR 88 | Temp 97.1°F | Resp 18 | Ht 75.0 in | Wt 287.6 lb

## 2017-02-18 DIAGNOSIS — L03115 Cellulitis of right lower limb: Secondary | ICD-10-CM | POA: Diagnosis not present

## 2017-02-18 DIAGNOSIS — E1122 Type 2 diabetes mellitus with diabetic chronic kidney disease: Secondary | ICD-10-CM | POA: Diagnosis not present

## 2017-02-18 DIAGNOSIS — I129 Hypertensive chronic kidney disease with stage 1 through stage 4 chronic kidney disease, or unspecified chronic kidney disease: Secondary | ICD-10-CM | POA: Diagnosis not present

## 2017-02-18 DIAGNOSIS — N181 Chronic kidney disease, stage 1: Secondary | ICD-10-CM | POA: Diagnosis not present

## 2017-02-18 DIAGNOSIS — F3012 Manic episode without psychotic symptoms, moderate: Secondary | ICD-10-CM | POA: Diagnosis not present

## 2017-02-18 DIAGNOSIS — R238 Other skin changes: Secondary | ICD-10-CM | POA: Diagnosis not present

## 2017-02-18 DIAGNOSIS — S80821D Blister (nonthermal), right lower leg, subsequent encounter: Secondary | ICD-10-CM | POA: Diagnosis not present

## 2017-02-18 NOTE — Progress Notes (Addendum)
Name: Wayne Jones   MRN: [id number]    DOB: [birth year]   Date:02/18/2017       Progress Note  Subjective  Chief Complaint  Chief Complaint  Patient presents with  . Wound Infection    right lower leg    HPI  - Pt presents with representative from [name] who provides most of the history. - Pt was seen by Wound Care 6 days ago (Friday) and was supposed to be seen again on Tuesday (yesterday), but they never showed. Called [place] with [facility] -- was seen Friday 02/13/2017 for the first time, it was determined that he would be seen once a week, twice if needed.  Wound dressing supplies have been shipped and should be arriving at the patient's home in 1-2 days. Wound care nurse will be visiting patient tomorrow.  - Pt doing well, bulla has deflated significantly and a small amount of fluid remains inside.  Patient endorses some pain. Staff member denies behavioral changes, no fevers (Last night temp was 97.88F at home, all VSS) or chills, no increased redness or swelling (has +2 BLE edema baseline). Completed course of Clindamycin.  Patient Active Problem List   Diagnosis Date Noted  . MR (mental retardation) 03/17/2016  . Polypharmacy 01/10/2016  . Benign prostatic hypertrophy without urinary obstruction 01/09/2015  . Chronic kidney disease (CKD), stage I 01/09/2015  . Cognitive decline 01/09/2015  . Controlled type 2 diabetes mellitus with microalbuminuria ([place]) 01/09/2015  . Acid reflux 01/09/2015  . Hearing loss 01/09/2015  . HLD (hyperlipidemia) 01/09/2015  . Extreme obesity 01/09/2015  . Obstructive sleep apnea 01/09/2015  . Other specified causes of urethral stricture 01/09/2015  . Hernia of anterior abdominal wall 01/09/2015  . Difficult or painful urination 07/09/2012  . Incomplete bladder emptying 07/09/2012  . 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 Benign essential HTN 02/04/2007  . Bipolar I disorder, single manic episode, moderate ([place]) 02/04/2007    Social  History  Substance Use Topics  . Smoking status: Never Smoker  . Smokeless tobacco: Never Used  . Alcohol use No    Current Outpatient Prescriptions:  .  ARIPiprazole (ABILIFY) 20 MG tablet, Take 20 mg by mouth daily., Disp: , Rfl:  .  aspirin 81 MG tablet, Take 1 tablet (81 mg total) by mouth daily. Enteric coated, Disp: 30 tablet, Rfl: 12 .  carbamazepine (EQUETRO) 200 MG CP12 12 hr capsule, Take 1 capsule (200 mg total) by mouth 2 (two) times daily., Disp: 180 each, Rfl: 3 .  chlorhexidine (PERIDEX) 0.12 % solution, Use as directed 15 mLs in the mouth or throat 2 (two) times daily., Disp: , Rfl:  .  clindamycin (CLEOCIN) 300 MG capsule, Take 1 capsule (300 mg total) by mouth 4 (four) times daily., Disp: 28 capsule, Rfl: 0 .  clonazePAM (KLONOPIN) 1 MG tablet, Take 1 mg by mouth daily., Disp: , Rfl:  .  diphenhydrAMINE (SOMINEX) 25 MG tablet, Take 25 mg by mouth at bedtime as needed for sleep., Disp: , Rfl:  .  EAR DROPS 6.5 % otic solution, PLACE 5 DROPS INTO BOTH EARS TWO TIMES A DAY, Disp: 15 mL, Rfl: 10 .  Elastic Bandages & Supports (MEDICAL COMPRESSION SOCKS) MISC, 2 each by Does not apply route daily. Apply in am's and remove it at bedtime, Disp: 2 each, Rfl: 1 .  finasteride (PROSCAR) 5 MG tablet, Take 5 mg by mouth daily., Disp: , Rfl:  .  FLUoxetine (PROZAC) 20 MG capsule, Take 1 capsule (20  mg total) by mouth daily., Disp: 90 capsule, Rfl: 3 .  gabapentin (NEURONTIN) 600 MG tablet, Take 600 mg by mouth at bedtime., Disp: , Rfl:  .  guaiFENesin-dextromethorphan (ROBITUSSIN DM) 100-10 MG/5ML syrup, Take 5 mLs by mouth every 4 (four) hours as needed for cough., Disp: , Rfl:  .  ibuprofen (ADVIL,MOTRIN) 800 MG tablet, Take 800 mg by mouth every 8 (eight) hours as needed., Disp: , Rfl:  .  lisinopril (PRINIVIL,ZESTRIL) 20 MG tablet, Take 1 tablet (20 mg total) by mouth daily., Disp: 30 tablet, Rfl: 5 .  loratadine (CLARITIN) 10 MG tablet, TAKE (1) TABLET BY MOUTH DAILY FOR ALLERGY.,  Disp: 30 tablet, Rfl: 5 .  nystatin (MYCOSTATIN/NYSTOP) powder, APPLY 3 TIMES A DAY, Disp: 30 g, Rfl: 2 .  ofloxacin (FLOXIN OTIC) 0.3 % OTIC solution, Place 5 drops into both ears daily., Disp: 10 mL, Rfl: 0 .  omega-3 acid ethyl esters (LOVAZA) 1 g capsule, Take 1 capsule (1 g total) by mouth 2 (two) times daily., Disp: 180 capsule, Rfl: 5 .  omeprazole (PRILOSEC) 20 MG capsule, TAKE 1 CAPSULE BY MOUTH ONCE A DAY.. (G.E.R.D.) DO NOT CRUSH, Disp: 30 capsule, Rfl: 4 .  propranolol ER (INDERAL LA) 60 MG 24 hr capsule, Take 1 capsule (60 mg total) by mouth daily. (Patient taking differently: Take 60 mg by mouth 2 (two) times daily. ), Disp: 90 capsule, Rfl: 3 .  ranitidine (ZANTAC) 150 MG tablet, Take 1 tablet (150 mg total) by mouth at bedtime., Disp: 90 tablet, Rfl: 3 .  tamsulosin (FLOMAX) 0.4 MG CAPS capsule, Take 0.4 mg by mouth daily after supper., Disp: , Rfl:  .  TRUETRACK TEST test strip, FINGERSTICK BLOOD SUGAR TEST ONCE DAILY., Disp: 100 each, Rfl: 0 .  atorvastatin (LIPITOR) 10 MG tablet, Take 1 tablet (10 mg total) by mouth once., Disp: 30 tablet, Rfl: 5  No Known Allergies  ROS Constitutional: Negative for fever or weight change.  Respiratory: Negative for cough and shortness of breath.   Cardiovascular: Negative for chest pain or palpitations.  Gastrointestinal: Negative for abdominal pain, no bowel changes.  Musculoskeletal: Negative for gait problem or joint swelling.  Skin: Negative for rash. Positive for wound. Neurological: Negative for dizziness or headache.  No other specific complaints in a complete review of systems (except as listed in HPI above).  Objective  Vitals:   02/18/17 0928  BP: 120/76  Pulse: 88  Resp: 18  Temp: (!) 97.1 F (36.2 C)  TempSrc: Oral  SpO2: 94%  Weight: 287 lb 9.6 oz (130.5 kg)  Height: 6\' 3"  (1.905 m)   Body mass index is 35.95 kg/m.  Nursing Note and Vital Signs reviewed.  Physical Exam Constitutional: Patient appears  well-developed and well-nourished. Obese  No distress.  HEENT: head atraumatic, normocephalic Cardiovascular: Normal rate, regular rhythm, S1/S2 present.  No murmur or rub heard. +2 BLE edema. Pulmonary/Chest: Effort normal and breath sounds clear. No respiratory distress or retractions. Skin: Multiple scars to BLE; Large bulla is deflated and open to right shin, tender on palpation, small amount of clear drainage, half of area has thin film of skin covering the area and other half has open, well-appearing wound bed visible; mild surrounding erythema that is non-tender.  Petroleum gauze dressing is applied in office to maintain moisture and non-adherence to gauze. Psychiatric: Patient has a baseline mood and affect. behavior is baseline. Judgment and thought content baseline.  Recent Results (from the past 2160 hour(s))  POCT HgB A1C  Status: None   Collection Time: 01/19/17  1:27 PM  Result Value Ref Range   Hemoglobin A1C 6.5   Ear Culture     Status: None   Collection Time: 01/19/17  2:21 PM  Result Value Ref Range   Organism ID, Bacteria NO GROWTH 2 DAYS      Assessment & Plan  1. Skin bulla - Wound care  -Red flags and when to present for emergency care or RTC including fever >101.48F, new/worsening/un-resolving symptoms, and continue to monitor for signs and symptoms of infection reviewed with patient at time of visit. Follow up and care instructions discussed and provided in AVS.  I have reviewed this encounter including the documentation in this note and/or discussed this patient with the Johney Maine, FNP, NP-C. I am certifying that I agree with the content of this note as supervising physician.  Steele Sizer, MD [facility] 02/18/2017, 1:48 PM

## 2017-02-18 NOTE — Patient Instructions (Signed)
Wound Care Nurse will call Wayne Jones representative to schedule their weekly appointment with you in the next day or two. Please return if there are any signs of infection including increased redness, red streaking, increased pain, or increased swelling.

## 2017-02-25 ENCOUNTER — Encounter: Payer: Self-pay | Admitting: Family Medicine

## 2017-02-25 ENCOUNTER — Ambulatory Visit (INDEPENDENT_AMBULATORY_CARE_PROVIDER_SITE_OTHER): Payer: Medicare Other | Admitting: Family Medicine

## 2017-02-25 VITALS — BP 128/84 | HR 60 | Temp 98.0°F | Resp 18 | Ht 75.0 in | Wt 286.4 lb

## 2017-02-25 DIAGNOSIS — E1122 Type 2 diabetes mellitus with diabetic chronic kidney disease: Secondary | ICD-10-CM | POA: Diagnosis not present

## 2017-02-25 DIAGNOSIS — I129 Hypertensive chronic kidney disease with stage 1 through stage 4 chronic kidney disease, or unspecified chronic kidney disease: Secondary | ICD-10-CM | POA: Diagnosis not present

## 2017-02-25 DIAGNOSIS — S80821D Blister (nonthermal), right lower leg, subsequent encounter: Secondary | ICD-10-CM | POA: Diagnosis not present

## 2017-02-25 DIAGNOSIS — R238 Other skin changes: Secondary | ICD-10-CM

## 2017-02-25 DIAGNOSIS — L03115 Cellulitis of right lower limb: Secondary | ICD-10-CM | POA: Diagnosis not present

## 2017-02-25 DIAGNOSIS — N181 Chronic kidney disease, stage 1: Secondary | ICD-10-CM | POA: Diagnosis not present

## 2017-02-25 DIAGNOSIS — F3012 Manic episode without psychotic symptoms, moderate: Secondary | ICD-10-CM | POA: Diagnosis not present

## 2017-02-25 NOTE — Progress Notes (Addendum)
Name: Wayne Jones   MRN: [id number]    DOB: September 03, 1960   Date:02/25/2017       Progress Note  Subjective  Chief Complaint  Chief Complaint  Patient presents with  . Wound Check    follow up    HPI  Pt has MR and lives in a group home, he is under the care of [name] and is accompanied by Therisa Doyne today who provides much of the history.     Pt presents for wound check of burst bula to right shin. Has had home health visiting for wound care once a week and has been doing well. PT complained of pain at the site today and staff brought him in. Mr. Bobbye Charleston does note that patient was supposed to go on a field trip today and did not want to go, questions if this is why he complained of pain. Pt was given Ibuprofen this morning and is no longer complaining of pain.  Patient Active Problem List   Diagnosis Date Noted  . MR (mental retardation) 03/17/2016  . Polypharmacy 01/10/2016  . Benign prostatic hypertrophy without urinary obstruction 01/09/2015  . Chronic kidney disease (CKD), stage I 01/09/2015  . Cognitive decline 01/09/2015  . Controlled type 2 diabetes mellitus with microalbuminuria ([place]) 01/09/2015  . Acid reflux 01/09/2015  . Hearing loss 01/09/2015  . HLD (hyperlipidemia) 01/09/2015  . Extreme obesity 01/09/2015  . Obstructive sleep apnea 01/09/2015  . Other specified causes of urethral stricture 01/09/2015  . Hernia of anterior abdominal wall 01/09/2015  . Difficult or painful urination 07/09/2012  . Incomplete bladder emptying 07/09/2012  . 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 Benign essential HTN 02/04/2007  . Bipolar I disorder, single manic episode, moderate ([place]) 02/04/2007    Social History  Substance Use Topics  . Smoking status: Never Smoker  . Smokeless tobacco: Never Used  . Alcohol use No     Current Outpatient Prescriptions:  .  ARIPiprazole (ABILIFY) 20 MG tablet, Take 20 mg by mouth daily., Disp: , Rfl:  .  aspirin 81 MG tablet, Take 1  tablet (81 mg total) by mouth daily. Enteric coated, Disp: 30 tablet, Rfl: 12 .  atorvastatin (LIPITOR) 10 MG tablet, Take 1 tablet (10 mg total) by mouth once., Disp: 30 tablet, Rfl: 5 .  carbamazepine (EQUETRO) 200 MG CP12 12 hr capsule, Take 1 capsule (200 mg total) by mouth 2 (two) times daily., Disp: 180 each, Rfl: 3 .  chlorhexidine (PERIDEX) 0.12 % solution, Use as directed 15 mLs in the mouth or throat 2 (two) times daily., Disp: , Rfl:  .  clonazePAM (KLONOPIN) 1 MG tablet, Take 1 mg by mouth daily., Disp: , Rfl:  .  diphenhydrAMINE (SOMINEX) 25 MG tablet, Take 25 mg by mouth at bedtime as needed for sleep., Disp: , Rfl:  .  EAR DROPS 6.5 % otic solution, PLACE 5 DROPS INTO BOTH EARS TWO TIMES A DAY, Disp: 15 mL, Rfl: 10 .  Elastic Bandages & Supports (MEDICAL COMPRESSION SOCKS) MISC, 2 each by Does not apply route daily. Apply in am's and remove it at bedtime, Disp: 2 each, Rfl: 1 .  finasteride (PROSCAR) 5 MG tablet, Take 5 mg by mouth daily., Disp: , Rfl:  .  FLUoxetine (PROZAC) 20 MG capsule, Take 1 capsule (20 mg total) by mouth daily., Disp: 90 capsule, Rfl: 3 .  gabapentin (NEURONTIN) 600 MG tablet, Take 600 mg by mouth at bedtime., Disp: , Rfl:  .  guaiFENesin-dextromethorphan (ROBITUSSIN DM)  100-10 MG/5ML syrup, Take 5 mLs by mouth every 4 (four) hours as needed for cough., Disp: , Rfl:  .  ibuprofen (ADVIL,MOTRIN) 800 MG tablet, Take 800 mg by mouth every 8 (eight) hours as needed., Disp: , Rfl:  .  lisinopril (PRINIVIL,ZESTRIL) 20 MG tablet, Take 1 tablet (20 mg total) by mouth daily., Disp: 30 tablet, Rfl: 5 .  loratadine (CLARITIN) 10 MG tablet, TAKE (1) TABLET BY MOUTH DAILY FOR ALLERGY., Disp: 30 tablet, Rfl: 5 .  nystatin (MYCOSTATIN/NYSTOP) powder, APPLY 3 TIMES A DAY, Disp: 30 g, Rfl: 2 .  ofloxacin (FLOXIN OTIC) 0.3 % OTIC solution, Place 5 drops into both ears daily., Disp: 10 mL, Rfl: 0 .  omega-3 acid ethyl esters (LOVAZA) 1 g capsule, Take 1 capsule (1 g total) by  mouth 2 (two) times daily., Disp: 180 capsule, Rfl: 5 .  omeprazole (PRILOSEC) 20 MG capsule, TAKE 1 CAPSULE BY MOUTH ONCE A DAY.. (G.E.R.D.) DO NOT CRUSH, Disp: 30 capsule, Rfl: 4 .  propranolol ER (INDERAL LA) 60 MG 24 hr capsule, Take 1 capsule (60 mg total) by mouth daily. (Patient taking differently: Take 60 mg by mouth 2 (two) times daily. ), Disp: 90 capsule, Rfl: 3 .  ranitidine (ZANTAC) 150 MG tablet, Take 1 tablet (150 mg total) by mouth at bedtime., Disp: 90 tablet, Rfl: 3 .  tamsulosin (FLOMAX) 0.4 MG CAPS capsule, Take 0.4 mg by mouth daily after supper., Disp: , Rfl:  .  TRUETRACK TEST test strip, FINGERSTICK BLOOD SUGAR TEST ONCE DAILY., Disp: 100 each, Rfl: 0  No Known Allergies  ROS  Constitutional: Negative for fever or weight change.  Respiratory: Negative for cough and shortness of breath.   Cardiovascular: Negative for chest pain or palpitations.  Gastrointestinal: Negative for abdominal pain, no bowel changes.  Musculoskeletal: Negative for gait problem or joint swelling.  Skin: Negative for rash. Positive for wound. Neurological: Negative for dizziness or headache.  No other specific complaints in a complete review of systems (except as listed in HPI above).  Objective  Vitals:   02/25/17 0852  BP: 128/84  Pulse: 60  Resp: 18  Temp: 98 F (36.7 C)  TempSrc: Oral  SpO2: 98%  Weight: 286 lb 6.4 oz (129.9 kg)  Height: 6\' 3"  (1.905 m)   Body mass index is 35.8 kg/m.  Nursing Note and Vital Signs reviewed.  Physical Exam  Constitutional: Patient appears well-developed and well-nourished. Obese No distress.  HEENT: head atraumatic, normocephalic Cardiovascular: Normal rate, regular rhythm, S1/S2 present.  No murmur or rub heard. +2 BLE edema. Pulmonary/Chest: Effort normal and breath sounds clear. No respiratory distress or retractions. Psychiatric: Patient has a normal mood and affect. behavior is normal. Judgment and thought content normal. Skin:  Multiple scars to BLE; Large bulla is now flat and open to right shin, non-tender on palpation, small amount of clear drainage, reddened wound bed visible;minimal surrounding erythema that is non-tender. No red-streaking, no purulent or sanguinous drainage.  Dressing with ointment and gauze is applied in office to maintain moisture and non-adherence to gauze.  Recent Results (from the past 2160 hour(s))  POCT HgB A1C     Status: None   Collection Time: 01/19/17  1:27 PM  Result Value Ref Range   Hemoglobin A1C 6.5   Ear Culture     Status: None   Collection Time: 01/19/17  2:21 PM  Result Value Ref Range   Organism ID, Bacteria NO GROWTH 2 DAYS      Assessment &  Plan  1. Skin bulla - Change dressing - Called and LMOM for [facility] regarding delivery of wound care supplies. Per caregiver, a nurse is coming to patient's home later today for a wound check and dressing change. - Advised patient may continue to take Ibuprofen PRN. - Patient was treated with Clindamycin on initial visit, and currently there is no indication for further antibiotic therapy as wound appears to be healing appropriately.  -Red flags and when to present for emergency care or RTC including fever >101.66F, new/worsening/un-resolving symptoms,  red streaking, bleeding or drainage, increased swelling or pain reviewed with patient at time of visit. Follow up and care instructions discussed and provided in AVS.  I have reviewed this encounter including the documentation in this note and/or discussed this patient with the Johney Maine, FNP, NP-C. I am certifying that I agree with the content of this note as supervising physician.  Steele Sizer, MD [facility] 02/25/2017, 10:12 AM

## 2017-02-26 ENCOUNTER — Telehealth: Payer: Self-pay

## 2017-02-26 DIAGNOSIS — N401 Enlarged prostate with lower urinary tract symptoms: Secondary | ICD-10-CM | POA: Diagnosis not present

## 2017-02-26 DIAGNOSIS — F3012 Manic episode without psychotic symptoms, moderate: Secondary | ICD-10-CM | POA: Diagnosis not present

## 2017-02-26 DIAGNOSIS — N358 Other urethral stricture: Secondary | ICD-10-CM | POA: Diagnosis not present

## 2017-02-26 DIAGNOSIS — N181 Chronic kidney disease, stage 1: Secondary | ICD-10-CM | POA: Diagnosis not present

## 2017-02-26 DIAGNOSIS — R300 Dysuria: Secondary | ICD-10-CM | POA: Diagnosis not present

## 2017-02-26 DIAGNOSIS — R238 Other skin changes: Secondary | ICD-10-CM

## 2017-02-26 DIAGNOSIS — S80821D Blister (nonthermal), right lower leg, subsequent encounter: Secondary | ICD-10-CM | POA: Diagnosis not present

## 2017-02-26 DIAGNOSIS — L03115 Cellulitis of right lower limb: Secondary | ICD-10-CM | POA: Diagnosis not present

## 2017-02-26 DIAGNOSIS — E1122 Type 2 diabetes mellitus with diabetic chronic kidney disease: Secondary | ICD-10-CM | POA: Diagnosis not present

## 2017-02-26 DIAGNOSIS — R339 Retention of urine, unspecified: Secondary | ICD-10-CM | POA: Diagnosis not present

## 2017-02-26 DIAGNOSIS — I129 Hypertensive chronic kidney disease with stage 1 through stage 4 chronic kidney disease, or unspecified chronic kidney disease: Secondary | ICD-10-CM | POA: Diagnosis not present

## 2017-02-26 MED ORDER — SILVER SULFADIAZINE 1 % EX CREA: 1.0000 "application " | TOPICAL_CREAM | Freq: Every day | CUTANEOUS | 0 refills | Status: DC

## 2017-02-26 NOTE — Telephone Encounter (Signed)
Christy Reel RN from [facility] called to discuss patient - she is presently at patient's group home and noticed that patient now has 2 more bulla appearing on the LEFT shin with approximately 1/2in surrounding erythema, the protective sheath is still intact, pt still has +2pitting edema. Vitals are 145/77, O2 98%, HR 76, Temp 98.60F.  No behavioral changes, no complaints of pain, tolerating wound dressing well.  Plan is to send in Silvadene to be applied to surrounding areas or erythema daily, apply protective dressings to new areas on LEFT shin and ongoing dressings to RIGHT shin.  Referral to Dermatology placed, follow up appointment to be scheduled for patient next week to monitor for infection.

## 2017-02-26 NOTE — Telephone Encounter (Signed)
Christy with [facility] called stating that she was at this patient's home and noticed he has an area of concern on the other leg and wanted to inform us.  The call was transferred to Raelyn Ensign, [place]

## 2017-03-02 ENCOUNTER — Other Ambulatory Visit: Payer: Self-pay | Admitting: Family Medicine

## 2017-03-02 ENCOUNTER — Ambulatory Visit: Payer: Self-pay | Admitting: Podiatry

## 2017-03-02 DIAGNOSIS — H9213 Otorrhea, bilateral: Secondary | ICD-10-CM

## 2017-03-02 NOTE — Telephone Encounter (Signed)
Patient requesting refill of Ofloxacin to [facility].

## 2017-03-04 ENCOUNTER — Ambulatory Visit (INDEPENDENT_AMBULATORY_CARE_PROVIDER_SITE_OTHER): Payer: Medicare Other | Admitting: Family Medicine

## 2017-03-04 ENCOUNTER — Encounter: Payer: Self-pay | Admitting: Family Medicine

## 2017-03-04 VITALS — BP 132/86 | HR 65 | Temp 97.5°F | Resp 16 | Ht 75.0 in | Wt 281.1 lb

## 2017-03-04 DIAGNOSIS — R21 Rash and other nonspecific skin eruption: Secondary | ICD-10-CM

## 2017-03-04 DIAGNOSIS — R600 Localized edema: Secondary | ICD-10-CM | POA: Diagnosis not present

## 2017-03-04 DIAGNOSIS — R238 Other skin changes: Secondary | ICD-10-CM | POA: Diagnosis not present

## 2017-03-04 MED ORDER — TRIAMCINOLONE ACETONIDE 0.1 % EX CREA: 1.0000 "application " | TOPICAL_CREAM | Freq: Two times a day (BID) | CUTANEOUS | 0 refills | Status: DC

## 2017-03-04 NOTE — Progress Notes (Signed)
Name: Wayne Jones   MRN: [id number]    DOB: 26-Jul-1961   Date:03/04/2017       Progress Note  Subjective  Chief Complaint  Chief Complaint  Patient presents with  . Wound Check  . Rash    On Abdominal area recently came up and wanted to be looked at. Itchy and red.    HPI  Skin lesion: he was seen by Raelyn Ensign, NP a few weeks ago with bullae on right lower extremity, he was treated with Clindamycin and home health has been changing dressings. The bullae ruptured spontaneously, but developed another blister on left lower leg, he states it is tender, no oozing. On exam he was noted to have bilateral lower extremity edema  Rash : over the past two days he has noticed itching and rash on peri-umbilical area. Caregiver not sure if metal on his pants. Not spreading. No fever or chills.    Patient Active Problem List   Diagnosis Date Noted  . MR (mental retardation) 03/17/2016  . Polypharmacy 01/10/2016  . Benign prostatic hypertrophy without urinary obstruction 01/09/2015  . Chronic kidney disease (CKD), stage I 01/09/2015  . Cognitive decline 01/09/2015  . Controlled type 2 diabetes mellitus with microalbuminuria ([place]) 01/09/2015  . Acid reflux 01/09/2015  . Hearing loss 01/09/2015  . HLD (hyperlipidemia) 01/09/2015  . Extreme obesity 01/09/2015  . Obstructive sleep apnea 01/09/2015  . Other specified causes of urethral stricture 01/09/2015  . Hernia of anterior abdominal wall 01/09/2015  . Difficult or painful urination 07/09/2012  . Incomplete bladder emptying 07/09/2012  . 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 Benign essential HTN 02/04/2007  . Bipolar I disorder, single manic episode, moderate ([place]) 02/04/2007    Past Surgical History:  Procedure Laterality Date  . LAPAROSCOPIC CHOLECYSTECTOMY    . LAPAROSCOPY  12/03/2010  . VENTRAL HERNIA REPAIR      Family History  Problem Relation Age of Onset  . Family history unknown: Yes    Social History   Social  History  . Marital status: Single    Spouse name: N/A  . Number of children: N/A  . Years of education: N/A   Occupational History  . Not on file.   Social History Main Topics  . Smoking status: Never Smoker  . Smokeless tobacco: Never Used  . Alcohol use No  . Drug use: No  . Sexual activity: No   Other Topics Concern  . Not on file   Social History Narrative  . No narrative on file     Current Outpatient Prescriptions:  .  ARIPiprazole (ABILIFY) 20 MG tablet, Take 20 mg by mouth daily., Disp: , Rfl:  .  aspirin 81 MG tablet, Take 1 tablet (81 mg total) by mouth daily. Enteric coated, Disp: 30 tablet, Rfl: 12 .  carbamazepine (EQUETRO) 200 MG CP12 12 hr capsule, Take 1 capsule (200 mg total) by mouth 2 (two) times daily., Disp: 180 each, Rfl: 3 .  chlorhexidine (PERIDEX) 0.12 % solution, Use as directed 15 mLs in the mouth or throat 2 (two) times daily., Disp: , Rfl:  .  clonazePAM (KLONOPIN) 1 MG tablet, Take 1 mg by mouth daily., Disp: , Rfl:  .  diphenhydrAMINE (SOMINEX) 25 MG tablet, Take 25 mg by mouth at bedtime as needed for sleep., Disp: , Rfl:  .  EAR DROPS 6.5 % otic solution, PLACE 5 DROPS INTO BOTH EARS TWO TIMES A DAY, Disp: 15 mL, Rfl: 10 .  Elastic Bandages & Supports (  MEDICAL COMPRESSION SOCKS) MISC, 2 each by Does not apply route daily. Apply in am's and remove it at bedtime, Disp: 2 each, Rfl: 1 .  finasteride (PROSCAR) 5 MG tablet, Take 5 mg by mouth daily., Disp: , Rfl:  .  FLUoxetine (PROZAC) 20 MG capsule, Take 1 capsule (20 mg total) by mouth daily., Disp: 90 capsule, Rfl: 3 .  gabapentin (NEURONTIN) 600 MG tablet, Take 600 mg by mouth at bedtime., Disp: , Rfl:  .  guaiFENesin-dextromethorphan (ROBITUSSIN DM) 100-10 MG/5ML syrup, Take 5 mLs by mouth every 4 (four) hours as needed for cough., Disp: , Rfl:  .  ibuprofen (ADVIL,MOTRIN) 800 MG tablet, Take 800 mg by mouth every 8 (eight) hours as needed., Disp: , Rfl:  .  lisinopril (PRINIVIL,ZESTRIL) 20 MG  tablet, Take 1 tablet (20 mg total) by mouth daily., Disp: 30 tablet, Rfl: 5 .  loratadine (CLARITIN) 10 MG tablet, TAKE (1) TABLET BY MOUTH DAILY FOR ALLERGY., Disp: 30 tablet, Rfl: 5 .  nystatin (MYCOSTATIN/NYSTOP) powder, APPLY 3 TIMES A DAY, Disp: 30 g, Rfl: 2 .  ofloxacin (FLOXIN OTIC) 0.3 % OTIC solution, Place 5 drops into both ears daily., Disp: 10 mL, Rfl: 0 .  omega-3 acid ethyl esters (LOVAZA) 1 g capsule, Take 1 capsule (1 g total) by mouth 2 (two) times daily., Disp: 180 capsule, Rfl: 5 .  omeprazole (PRILOSEC) 20 MG capsule, TAKE 1 CAPSULE BY MOUTH ONCE A DAY.. (G.E.R.D.) DO NOT CRUSH, Disp: 30 capsule, Rfl: 4 .  propranolol ER (INDERAL LA) 60 MG 24 hr capsule, Take 1 capsule (60 mg total) by mouth daily. (Patient taking differently: Take 60 mg by mouth 2 (two) times daily. ), Disp: 90 capsule, Rfl: 3 .  ranitidine (ZANTAC) 150 MG tablet, Take 1 tablet (150 mg total) by mouth at bedtime., Disp: 90 tablet, Rfl: 3 .  silver sulfADIAZINE (SILVADENE) 1 % cream, Apply 1 application topically daily., Disp: 50 g, Rfl: 0 .  tamsulosin (FLOMAX) 0.4 MG CAPS capsule, Take 0.4 mg by mouth daily after supper., Disp: , Rfl:  .  TRUETRACK TEST test strip, FINGERSTICK BLOOD SUGAR TEST ONCE DAILY., Disp: 100 each, Rfl: 0 .  atorvastatin (LIPITOR) 10 MG tablet, Take 1 tablet (10 mg total) by mouth once., Disp: 30 tablet, Rfl: 5  No Known Allergies   ROS  Ten systems reviewed and is negative except as mentioned in HPI   Objective  Vitals:   03/04/17 1052  BP: 132/86  Pulse: 65  Resp: 16  Temp: (!) 97.5 F (36.4 C)  TempSrc: Oral  SpO2: 97%  Weight: 281 lb 1.6 oz (127.5 kg)  Height: 6\' 3"  (1.905 m)    Body mass index is 35.14 kg/m.  Physical Exam  Constitutional: Patient appears well-developed and well-nourished. Obese  No distress.  HEENT: head atraumatic, normocephalic, pupils equal and reactive to light,  neck supple, throat within normal limits Cardiovascular: Normal rate,  regular rhythm and normal heart sounds.  No murmur heard. 2 plus  BLE edema. Pulmonary/Chest: Effort normal and breath sounds normal. No respiratory distress. Abdominal: Soft.  There is no tenderness. Psychiatric: Calm and cooperative, history given by caregiver Skin: right skin bullae busted, and area looks dry and clean, left lower leg has small erythematous blister, he states it is tender, about 2 quarters in size. Still has home health    Recent Results (from the past 2160 hour(s))  POCT HgB A1C     Status: None   Collection Time: 01/19/17  1:27 PM  Result Value Ref Range   Hemoglobin A1C 6.5   Ear Culture     Status: None   Collection Time: 01/19/17  2:21 PM  Result Value Ref Range   Organism ID, Bacteria NO GROWTH 2 DAYS      PHQ2/9: Depression screen [facility] 2/9 01/19/2017 03/14/2015  Decreased Interest 0 0  Down, Depressed, Hopeless 0 1  PHQ - 2 Score 0 1    Fall Risk: Fall Risk  01/19/2017 07/15/2016 03/14/2015 03/14/2015  Falls in the past year? No No No No     Assessment & Plan  1. Skin bulla  Right side resolved, now has on left side, with bilateral lower extremity edema. I will refer him to vascular surgeon, continue dressing changes at home and avoid scratching skin  2. Rash in adult  Looks like contact dermatitis, avoid metal buckle, we will try triamcinolone  3. Lower extremity edema  - Consult to vascular surgery

## 2017-03-05 DIAGNOSIS — E1122 Type 2 diabetes mellitus with diabetic chronic kidney disease: Secondary | ICD-10-CM | POA: Diagnosis not present

## 2017-03-05 DIAGNOSIS — N181 Chronic kidney disease, stage 1: Secondary | ICD-10-CM | POA: Diagnosis not present

## 2017-03-05 DIAGNOSIS — L03115 Cellulitis of right lower limb: Secondary | ICD-10-CM | POA: Diagnosis not present

## 2017-03-05 DIAGNOSIS — S80821D Blister (nonthermal), right lower leg, subsequent encounter: Secondary | ICD-10-CM | POA: Diagnosis not present

## 2017-03-05 DIAGNOSIS — F3012 Manic episode without psychotic symptoms, moderate: Secondary | ICD-10-CM | POA: Diagnosis not present

## 2017-03-05 DIAGNOSIS — I129 Hypertensive chronic kidney disease with stage 1 through stage 4 chronic kidney disease, or unspecified chronic kidney disease: Secondary | ICD-10-CM | POA: Diagnosis not present

## 2017-03-10 DIAGNOSIS — F71 Moderate intellectual disabilities: Secondary | ICD-10-CM | POA: Diagnosis not present

## 2017-03-18 ENCOUNTER — Telehealth: Payer: Self-pay | Admitting: Family Medicine

## 2017-03-18 NOTE — Telephone Encounter (Signed)
Pt caregiver Therisa Doyne) has scheduled an appointment for October for wellness/fl-2. However, they are needing something sooner due to fl-2 running out. Is there anywhere I can put him or can emily do this for you? Please advise. Caregiver can be reached at [phone]

## 2017-03-20 NOTE — Telephone Encounter (Signed)
Called the group home and they made an appointment for this Tuesday 03/24/17

## 2017-03-20 NOTE — Telephone Encounter (Signed)
Please verify my scheduled, or ask Miel. Thank you

## 2017-03-24 ENCOUNTER — Ambulatory Visit (INDEPENDENT_AMBULATORY_CARE_PROVIDER_SITE_OTHER): Payer: Medicare Other | Admitting: Family Medicine

## 2017-03-24 ENCOUNTER — Encounter: Payer: Self-pay | Admitting: Family Medicine

## 2017-03-24 VITALS — BP 122/78 | HR 62 | Temp 97.4°F | Resp 18 | Ht 75.0 in | Wt 281.3 lb

## 2017-03-24 DIAGNOSIS — Z0000 Encounter for general adult medical examination without abnormal findings: Secondary | ICD-10-CM

## 2017-03-24 DIAGNOSIS — F79 Unspecified intellectual disabilities: Secondary | ICD-10-CM | POA: Diagnosis not present

## 2017-03-24 NOTE — Patient Instructions (Signed)
Preventive Care 40-64 Years, Male Preventive care refers to lifestyle choices and visits with your health care provider that can promote health and wellness. What does preventive care include?  A yearly physical exam. This is also called an annual well check.  Dental exams once or twice a year.  Routine eye exams. Ask your health care provider how often you should have your eyes checked.  Personal lifestyle choices, including: ? Daily care of your teeth and gums. ? Regular physical activity. ? Eating a healthy diet. ? Avoiding tobacco and drug use. ? Limiting alcohol use. ? Practicing safe sex. ? Taking low-dose aspirin every day starting at age 56. What happens during an annual well check? The services and screenings done by your health care provider during your annual well check will depend on your age, overall health, lifestyle risk factors, and family history of disease. Counseling Your health care provider may ask you questions about your:  Alcohol use.  Tobacco use.  Drug use.  Emotional well-being.  Home and relationship well-being.  Sexual activity.  Eating habits.  Work and work Statistician.  Screening You may have the following tests or measurements:  Height, weight, and BMI.  Blood pressure.  Lipid and cholesterol levels. These may be checked every 5 years, or more frequently if you are over 56 years old.  Skin check.  Lung cancer screening. You may have this screening every year starting at age 56 if you have a 30-pack-year history of smoking and currently smoke or have quit within the past 15 years.  Fecal occult blood test (FOBT) of the stool. You may have this test every year starting at age 56.  Flexible sigmoidoscopy or colonoscopy. You may have a sigmoidoscopy every 5 years or a colonoscopy every 10 years starting at age 56.  Prostate cancer screening. Recommendations will vary depending on your family history and other risks.  Hepatitis C  blood test.  Hepatitis B blood test.  Sexually transmitted disease (STD) testing.  Diabetes screening. This is done by checking your blood sugar (glucose) after you have not eaten for a while (fasting). You may have this done every 1-3 years.  Discuss your test results, treatment options, and if necessary, the need for more tests with your health care provider. Vaccines Your health care provider may recommend certain vaccines, such as:  Influenza vaccine. This is recommended every year.  Tetanus, diphtheria, and acellular pertussis (Tdap, Td) vaccine. You may need a Td booster every 10 years.  Varicella vaccine. You may need this if you have not been vaccinated.  Zoster vaccine. You may need this after age 56.  Measles, mumps, and rubella (MMR) vaccine. You may need at least one dose of MMR if you were born in 1957 or later. You may also need a second dose.  Pneumococcal 13-valent conjugate (PCV13) vaccine. You may need this if you have certain conditions and have not been vaccinated.  Pneumococcal polysaccharide (PPSV23) vaccine. You may need one or two doses if you smoke cigarettes or if you have certain conditions.  Meningococcal vaccine. You may need this if you have certain conditions.  Hepatitis A vaccine. You may need this if you have certain conditions or if you travel or work in places where you may be exposed to hepatitis A.  Hepatitis B vaccine. You may need this if you have certain conditions or if you travel or work in places where you may be exposed to hepatitis B.  Haemophilus influenzae type b (Hib) vaccine.  You may need this if you have certain risk factors.  Talk to your health care provider about which screenings and vaccines you need and how often you need them. This information is not intended to replace advice given to you by your health care provider. Make sure you discuss any questions you have with your health care provider. Document Released: 08/17/2015  Document Revised: 04/09/2016 Document Reviewed: 05/22/2015 Elsevier Interactive Patient Education  2017 Elsevier Inc.  

## 2017-03-24 NOTE — Progress Notes (Signed)
Name: Wayne Jones   MRN: [id number]    DOB: 27-May-1961   Date:03/24/2017       Progress Note  Subjective  Chief Complaint  Chief Complaint  Patient presents with  . FL2 wellness    HPI  Functional ability/safety issues: lives in a group home , has MR but is stable Hearing issues: on his records he has a history of hearing loss, but caregiver has not noticed problems listening, recently had otitis but resolved now.  Activities of daily living: Dependent for bathing, dressing , taking medication, transportation and meal prep, money management.  Home safety issues: No Issues  End Of Life Planning: caregiver thinks he has  advanced directives, healthcare power of attorney.   Preventative care, Health maintenance, Preventative health measures discussed.  Preventative screenings discussed today: lab work due in December 2018, colonoscopy - last one 2008 -we will schedule it PSA. - checked by Urologist  Men age 71 to 63 years if ever smoked recommended to get a one time AAA ultrasound screening exam. No previous history of tobacco use - not a candidate  Low Dose CT Chest recommended if Age 52-80 years, 30 pack-year currently smoking OR have quit w/in 15years. Not a candidate - never smoked  Lifestyle risk factor issued reviewed: Diet, exercise ( needs to increase ) , weight management, advised patient smoking is not healthy, nutrition/diet.  Preventative health measures discussed (5-10 year plan).  Reviewed and recommended vaccinations: - Pneumovax  - Prevnar  - Annual Influenza - he will get it during his visit in October - Zostavax - needs to checked by caregiver with insurance - Tdap   Depression screening: Done Fall risk screening: Done Discuss ADLs/IADLs: Done  Current medical providers: See HPI  Other health risk factors identified this visit: No other issues Cognitive impairment issues: he is unable to do the Mini mental status , he has MR  All above  discussed with patient. Appropriate education, counseling and referral will be made based upon the above.    Patient Active Problem List   Diagnosis Date Noted  . MR (mental retardation) 03/17/2016  . Polypharmacy 01/10/2016  . Benign prostatic hypertrophy without urinary obstruction 01/09/2015  . Chronic kidney disease (CKD), stage I 01/09/2015  . Cognitive decline 01/09/2015  . Controlled type 2 diabetes mellitus with microalbuminuria ([place]) 01/09/2015  . Acid reflux 01/09/2015  . Hearing loss 01/09/2015  . HLD (hyperlipidemia) 01/09/2015  . Extreme obesity 01/09/2015  . Obstructive sleep apnea 01/09/2015  . Other specified causes of urethral stricture 01/09/2015  . Hernia of anterior abdominal wall 01/09/2015  . Difficult or painful urination 07/09/2012  . Incomplete bladder emptying 07/09/2012  . 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 Benign essential HTN 02/04/2007  . Bipolar I disorder, single manic episode, moderate ([place]) 02/04/2007    Past Surgical History:  Procedure Laterality Date  . LAPAROSCOPIC CHOLECYSTECTOMY    . LAPAROSCOPY  12/03/2010  . VENTRAL HERNIA REPAIR      Family History  Problem Relation Age of Onset  . Family history unknown: Yes    Social History   Social History  . Marital status: Single    Spouse name: N/A  . Number of children: N/A  . Years of education: N/A   Occupational History  . Not on file.   Social History Main Topics  . Smoking status: Never Smoker  . Smokeless tobacco: Never Used  . Alcohol use No  . Drug use: No  . Sexual activity: No  Other Topics Concern  . Not on file   Social History Narrative  . No narrative on file     Current Outpatient Prescriptions:  .  ARIPiprazole (ABILIFY) 20 MG tablet, Take 20 mg by mouth daily., Disp: , Rfl:  .  aspirin 81 MG tablet, Take 1 tablet (81 mg total) by mouth daily. Enteric coated, Disp: 30 tablet, Rfl: 12 .  carbamazepine (EQUETRO) 200 MG CP12 12 hr capsule, Take 1  capsule (200 mg total) by mouth 2 (two) times daily., Disp: 180 each, Rfl: 3 .  clonazePAM (KLONOPIN) 1 MG tablet, Take 1 mg by mouth daily., Disp: , Rfl:  .  diphenhydrAMINE (SOMINEX) 25 MG tablet, Take 25 mg by mouth at bedtime as needed for sleep., Disp: , Rfl:  .  EAR DROPS 6.5 % otic solution, PLACE 5 DROPS INTO BOTH EARS TWO TIMES A DAY, Disp: 15 mL, Rfl: 10 .  Elastic Bandages & Supports (MEDICAL COMPRESSION SOCKS) MISC, 2 each by Does not apply route daily. Apply in am's and remove it at bedtime, Disp: 2 each, Rfl: 1 .  finasteride (PROSCAR) 5 MG tablet, Take 5 mg by mouth daily., Disp: , Rfl:  .  FLUoxetine (PROZAC) 20 MG capsule, Take 1 capsule (20 mg total) by mouth daily., Disp: 90 capsule, Rfl: 3 .  gabapentin (NEURONTIN) 600 MG tablet, Take 600 mg by mouth at bedtime., Disp: , Rfl:  .  guaiFENesin-dextromethorphan (ROBITUSSIN DM) 100-10 MG/5ML syrup, Take 5 mLs by mouth every 4 (four) hours as needed for cough., Disp: , Rfl:  .  ibuprofen (ADVIL,MOTRIN) 800 MG tablet, Take 800 mg by mouth every 8 (eight) hours as needed., Disp: , Rfl:  .  lisinopril (PRINIVIL,ZESTRIL) 20 MG tablet, Take 1 tablet (20 mg total) by mouth daily., Disp: 30 tablet, Rfl: 5 .  loratadine (CLARITIN) 10 MG tablet, TAKE (1) TABLET BY MOUTH DAILY FOR ALLERGY., Disp: 30 tablet, Rfl: 5 .  omega-3 acid ethyl esters (LOVAZA) 1 g capsule, Take 1 capsule (1 g total) by mouth 2 (two) times daily., Disp: 180 capsule, Rfl: 5 .  omeprazole (PRILOSEC) 20 MG capsule, TAKE 1 CAPSULE BY MOUTH ONCE A DAY.. (G.E.R.D.) DO NOT CRUSH, Disp: 30 capsule, Rfl: 4 .  propranolol ER (INDERAL LA) 60 MG 24 hr capsule, Take 1 capsule (60 mg total) by mouth daily. (Patient taking differently: Take 60 mg by mouth 2 (two) times daily. ), Disp: 90 capsule, Rfl: 3 .  ranitidine (ZANTAC) 150 MG tablet, Take 1 tablet (150 mg total) by mouth at bedtime., Disp: 90 tablet, Rfl: 3 .  tamsulosin (FLOMAX) 0.4 MG CAPS capsule, Take 0.4 mg by mouth daily  after supper., Disp: , Rfl:  .  TRUETRACK TEST test strip, FINGERSTICK BLOOD SUGAR TEST ONCE DAILY., Disp: 100 each, Rfl: 0 .  atorvastatin (LIPITOR) 10 MG tablet, Take 1 tablet (10 mg total) by mouth once., Disp: 30 tablet, Rfl: 5 .  chlorhexidine (PERIDEX) 0.12 % solution, Use as directed 15 mLs in the mouth or throat 2 (two) times daily., Disp: , Rfl:  .  nystatin (MYCOSTATIN/NYSTOP) powder, APPLY 3 TIMES A DAY (Patient not taking: Reported on 03/24/2017), Disp: 30 g, Rfl: 2 .  ofloxacin (FLOXIN OTIC) 0.3 % OTIC solution, Place 5 drops into both ears daily. (Patient not taking: Reported on 03/24/2017), Disp: 10 mL, Rfl: 0 .  silver sulfADIAZINE (SILVADENE) 1 % cream, Apply 1 application topically daily. (Patient not taking: Reported on 03/24/2017), Disp: 50 g, Rfl: 0 .  triamcinolone cream (KENALOG) 0.1 %, Apply 1 application topically 2 (two) times daily. On abdominal rash only (Patient not taking: Reported on 03/24/2017), Disp: 80 g, Rfl: 0  No Known Allergies   ROS  Constitutional: Negative for fever or weight change.  Respiratory: Negative for cough and shortness of breath.   Cardiovascular: Negative for chest pain or palpitations.  Gastrointestinal: Negative for abdominal pain, no bowel changes.  Musculoskeletal: Negative for gait problem or joint swelling.  Skin: Negative for rash, wound resolved.   Neurological: Negative for dizziness or headache.  No other specific complaints in a complete review of systems (except as listed in HPI above).  Objective  Vitals:   03/24/17 1407  BP: 122/78  Pulse: 62  Resp: 18  Temp: (!) 97.4 F (36.3 C)  SpO2: 96%  Weight: 281 lb 5 oz (127.6 kg)  Height: 6\' 3"  (1.905 m)    Body mass index is 35.16 kg/m.  Physical Exam  Constitutional: Patient appears well-developed and well-nourished. Obese  No distress.  HEENT: head atraumatic, normocephalic, pupils equal and reactive to light, neck supple, throat within normal limits,  gingivitis Cardiovascular: Normal rate, regular rhythm and normal heart sounds.  No murmur heard. No BLE edema. Pulmonary/Chest: Effort normal and breath sounds normal. No respiratory distress. Abdominal: Soft.  There is no tenderness. Psychiatric: Patient hasl mood and affect. Cooperative, stable MR  Recent Results (from the past 2160 hour(s))  POCT HgB A1C     Status: None   Collection Time: 01/19/17  1:27 PM  Result Value Ref Range   Hemoglobin A1C 6.5   Ear Culture     Status: None   Collection Time: 01/19/17  2:21 PM  Result Value Ref Range   Organism ID, Bacteria NO GROWTH 2 DAYS      PHQ2/9: Depression screen [facility] 2/9 01/19/2017 03/14/2015  Decreased Interest 0 0  Down, Depressed, Hopeless 0 1  PHQ - 2 Score 0 1     Fall Risk: Fall Risk  01/19/2017 07/15/2016 03/14/2015 03/14/2015  Falls in the past year? No No No No    Functional Status Survey: Is the patient deaf or have difficulty hearing?: No Does the patient have difficulty seeing, even when wearing glasses/contacts?: No Does the patient have difficulty concentrating, remembering, or making decisions?: Yes Does the patient have difficulty walking or climbing stairs?: No Does the patient have difficulty dressing or bathing?: Yes Does the patient have difficulty doing errands alone such as visiting a doctor's office or shopping?: Yes   Current Exercise Habits: Home exercise routine, Type of exercise: walking, Time (Minutes): 20, Frequency (Times/Week): 3, Weekly Exercise (Minutes/Week): 60, Intensity: Mild Exercise limited by: None identified   Assessment & Plan  1. Medicare annual wellness visit, subsequent  Discussed importance of 150 minutes of physical activity weekly, eat two servings of fish weekly, eat one serving of tree nuts ( cashews, pistachios, pecans, almonds.Marland Kitchen) every other day, eat 6 servings of fruit/vegetables daily and drink plenty of water and avoid sweet beverages.   2. MR (mental  retardation)  Stable - FL2 forms filled out

## 2017-03-25 ENCOUNTER — Other Ambulatory Visit: Payer: Self-pay

## 2017-03-25 DIAGNOSIS — K219 Gastro-esophageal reflux disease without esophagitis: Secondary | ICD-10-CM

## 2017-03-25 MED ORDER — OMEPRAZOLE 20 MG PO CPDR: DELAYED_RELEASE_CAPSULE | ORAL | 4 refills | Status: DC

## 2017-04-13 ENCOUNTER — Encounter: Payer: Self-pay | Admitting: Podiatry

## 2017-04-13 ENCOUNTER — Ambulatory Visit (INDEPENDENT_AMBULATORY_CARE_PROVIDER_SITE_OTHER): Payer: Medicare Other | Admitting: Podiatry

## 2017-04-13 DIAGNOSIS — M79674 Pain in right toe(s): Secondary | ICD-10-CM

## 2017-04-13 DIAGNOSIS — B351 Tinea unguium: Secondary | ICD-10-CM

## 2017-04-13 DIAGNOSIS — E119 Type 2 diabetes mellitus without complications: Secondary | ICD-10-CM

## 2017-04-13 DIAGNOSIS — M79675 Pain in left toe(s): Secondary | ICD-10-CM

## 2017-04-13 NOTE — Progress Notes (Signed)
Complaint:  Visit Type: Patient returns to my office for continued preventative foot care services. Complaint: Patient states" my nails have grown long and thick and become painful to walk and wear shoes"  The patient presents for preventative foot care services. No changes to ROS.  Patient is diabetic with no foot complications.  Podiatric Exam: Vascular: dorsalis pedis and posterior tibial pulses are palpable bilateral. Capillary return is immediate. Temperature gradient is WNL. Skin turgor WNL  Sensorium: Normal Semmes Weinstein monofilament test. Normal tactile sensation bilaterally. Nail Exam: Pt has thick disfigured discolored nails with subungual debris noted bilateral entire nail hallux through fifth toenails.  Previous nail surgery both hallux nails. Ulcer Exam: There is no evidence of ulcer or pre-ulcerative changes or infection. Orthopedic Exam: Muscle tone and strength are WNL. No limitations in general ROM. No crepitus or effusions noted. Foot type and digits show no abnormalities. Bony prominences are unremarkable. Skin: No Porokeratosis. No infection or ulcers  Diagnosis:  Onychomycosis, , Pain in right toe, pain in left toes  Treatment & Plan Procedures and Treatment: Consent by patient was obtained for treatment procedures. The patient understood the discussion of treatment and procedures well. All questions were answered thoroughly reviewed. Debridement of mycotic and hypertrophic toenails, 1 through 5 bilateral and clearing of subungual debris. No ulceration, no infection noted.  Return Visit-Office Procedure: Patient instructed to return to the office for a follow up visit 3 months for continued evaluation and treatment.    Gardiner Barefoot DPM

## 2017-04-14 ENCOUNTER — Ambulatory Visit (INDEPENDENT_AMBULATORY_CARE_PROVIDER_SITE_OTHER): Payer: Medicare Other | Admitting: Vascular Surgery

## 2017-04-14 VITALS — BP 138/76 | HR 51 | Resp 17 | Ht 74.0 in | Wt 276.0 lb

## 2017-04-14 DIAGNOSIS — E7800 Pure hypercholesterolemia, unspecified: Secondary | ICD-10-CM

## 2017-04-14 DIAGNOSIS — E1129 Type 2 diabetes mellitus with other diabetic kidney complication: Secondary | ICD-10-CM | POA: Diagnosis not present

## 2017-04-14 DIAGNOSIS — R809 Proteinuria, unspecified: Secondary | ICD-10-CM

## 2017-04-14 DIAGNOSIS — R600 Localized edema: Secondary | ICD-10-CM | POA: Diagnosis not present

## 2017-04-14 NOTE — Progress Notes (Signed)
Subjective:    Patient ID: Wayne Jones, male    DOB: March 15, 1961, 56 y.o.   MRN: [id number] Chief Complaint  Patient presents with  . New Patient (Initial Visit)    Edema   Presents at the request of Dr. Ancil Boozer for long-standing bilateral lower extremity edema. Patient was seen with group home attendant who was able to provide most of his history. Patient has had long-standing bilateral lower extremity edema worse towards the end of the day. Currently, the patient does not wear compression stockings or elevate his legs heart level or higher. Attendant states that his daycare staff had cut off his compression stockings. Attendant states the patient will pick at scabs making the marks. Attendant states the patient does not really complain about pain. Patient denies any fever, nausea or vomiting.    Review of Systems  Constitutional: Negative.   HENT: Negative.   Eyes: Negative.   Respiratory: Negative.   Cardiovascular: Positive for leg swelling.  Gastrointestinal: Negative.   Endocrine: Negative.   Genitourinary: Negative.   Musculoskeletal: Negative.   Skin: Negative.   Allergic/Immunologic: Negative.   Neurological: Negative.   Hematological: Negative.   Psychiatric/Behavioral: Negative.       Objective:   Physical Exam  Constitutional: He is oriented to person, place, and time. He appears well-developed and well-nourished. No distress.  HENT:  Head: Normocephalic and atraumatic.  Eyes: Pupils are equal, round, and reactive to light. Conjunctivae are normal.  Neck: Normal range of motion.  Cardiovascular: Normal rate, regular rhythm, normal heart sounds and intact distal pulses.   Pulses:      Radial pulses are 2+ on the right side, and 2+ on the left side.  Unable to palpate pedal pulses due to body habitus and edema  Pulmonary/Chest: Effort normal.  Musculoskeletal: Normal range of motion.  Neurological: He is alert and oriented to person, place, and time.  Skin:  Skin is warm and dry. He is not diaphoretic.     Scattered non-infected scabs noticed on the bilateral calves  Psychiatric: He has a normal mood and affect. His behavior is normal. Judgment and thought content normal.  Vitals reviewed.  BP 138/76 (BP Location: Right Arm)   Pulse (!) 51   Resp 17   Ht 6\' 2"  (1.88 m)   Wt 276 lb (125.2 kg)   BMI 35.44 kg/m   Past Medical History:  Diagnosis Date  . Depression   . Diabetes mellitus without complication ([place])   . Hyperlipidemia   . Hypertension   . Mental disorder     Social History   Social History  . Marital status: Single    Spouse name: N/A  . Number of children: N/A  . Years of education: N/A   Occupational History  . Not on file.   Social History Main Topics  . Smoking status: Never Smoker  . Smokeless tobacco: Never Used  . Alcohol use No  . Drug use: No  . Sexual activity: No   Other Topics Concern  . Not on file   Social History Narrative  . No narrative on file    Past Surgical History:  Procedure Laterality Date  . LAPAROSCOPIC CHOLECYSTECTOMY    . LAPAROSCOPY  12/03/2010  . VENTRAL HERNIA REPAIR      Family History  Problem Relation Age of Onset  . Family history unknown: Yes    No Known Allergies     Assessment & Plan:  Presents at the request of Dr. Ancil Boozer  for long-standing bilateral lower extremity edema. Patient was seen with group home attendant who was able to provide most of his history. Patient has had long-standing bilateral lower extremity edema worse towards the end of the day. Currently, the patient does not wear compression stockings or elevate his legs heart level or higher. Attendant states that his daycare staff had cut off his compression stockings. Attendant states the patient will pick at scabs making the marks. Attendant states the patient does not really complain about pain. Patient denies any fever, nausea or vomiting.   1. Controlled type 2 diabetes mellitus with  microalbuminuria, without long-term current use of insulin (HCC) - stable Encouraged good control as its slows the progression of atherosclerotic disease  2. Pure hypercholesterolemia - stable Encouraged good control as its slows the progression of atherosclerotic disease  3. Bilateral lower extremity edema - new The patient was encouraged to wear graduated compression stockings (20-30 mmHg) on a daily basis. The patient was instructed to begin wearing the stockings first thing in the morning and removing them in the evening. The patient was instructed specifically not to sleep in the stockings. Prescription given In addition, behavioral modification including elevation during the day will be initiated. Anti-inflammatories for pain. We'll bring patient back in 3 months for formal workup for reflux  - VAS [place] LOWER EXTREMITY VENOUS REFLUX; Future  Current Outpatient Prescriptions on File Prior to Visit  Medication Sig Dispense Refill  . ARIPiprazole (ABILIFY) 20 MG tablet Take 20 mg by mouth daily.    Marland Kitchen aspirin 81 MG tablet Take 1 tablet (81 mg total) by mouth daily. Enteric coated 30 tablet 12  . atorvastatin (LIPITOR) 10 MG tablet Take 1 tablet (10 mg total) by mouth once. 30 tablet 5  . carbamazepine (EQUETRO) 200 MG CP12 12 hr capsule Take 1 capsule (200 mg total) by mouth 2 (two) times daily. 180 each 3  . chlorhexidine (PERIDEX) 0.12 % solution Use as directed 15 mLs in the mouth or throat 2 (two) times daily.    . clonazePAM (KLONOPIN) 1 MG tablet Take 1 mg by mouth daily.    . diphenhydrAMINE (SOMINEX) 25 MG tablet Take 25 mg by mouth at bedtime as needed for sleep.    Marland Kitchen EAR DROPS 6.5 % otic solution PLACE 5 DROPS INTO BOTH EARS TWO TIMES A DAY 15 mL 10  . Elastic Bandages & Supports (MEDICAL COMPRESSION SOCKS) MISC 2 each by Does not apply route daily. Apply in am's and remove it at bedtime 2 each 1  . finasteride (PROSCAR) 5 MG tablet Take 5 mg by mouth daily.    Marland Kitchen FLUoxetine  (PROZAC) 20 MG capsule Take 1 capsule (20 mg total) by mouth daily. 90 capsule 3  . gabapentin (NEURONTIN) 600 MG tablet Take 600 mg by mouth at bedtime.    Marland Kitchen guaiFENesin-dextromethorphan (ROBITUSSIN DM) 100-10 MG/5ML syrup Take 5 mLs by mouth every 4 (four) hours as needed for cough.    Marland Kitchen ibuprofen (ADVIL,MOTRIN) 800 MG tablet Take 800 mg by mouth every 8 (eight) hours as needed.    Marland Kitchen lisinopril (PRINIVIL,ZESTRIL) 20 MG tablet Take 1 tablet (20 mg total) by mouth daily. 30 tablet 5  . loratadine (CLARITIN) 10 MG tablet TAKE (1) TABLET BY MOUTH DAILY FOR ALLERGY. 30 tablet 5  . nystatin (MYCOSTATIN/NYSTOP) powder APPLY 3 TIMES A DAY (Patient not taking: Reported on 03/24/2017) 30 g 2  . ofloxacin (FLOXIN OTIC) 0.3 % OTIC solution Place 5 drops into both ears daily. (  Patient not taking: Reported on 03/24/2017) 10 mL 0  . omega-3 acid ethyl esters (LOVAZA) 1 g capsule Take 1 capsule (1 g total) by mouth 2 (two) times daily. 180 capsule 5  . omeprazole (PRILOSEC) 20 MG capsule TAKE 1 CAPSULE BY MOUTH ONCE A DAY.. (G.E.R.D.) DO NOT CRUSH 30 capsule 4  . propranolol ER (INDERAL LA) 60 MG 24 hr capsule Take 1 capsule (60 mg total) by mouth daily. (Patient taking differently: Take 60 mg by mouth 2 (two) times daily. ) 90 capsule 3  . ranitidine (ZANTAC) 150 MG tablet Take 1 tablet (150 mg total) by mouth at bedtime. 90 tablet 3  . silver sulfADIAZINE (SILVADENE) 1 % cream Apply 1 application topically daily. (Patient not taking: Reported on 03/24/2017) 50 g 0  . tamsulosin (FLOMAX) 0.4 MG CAPS capsule Take 0.4 mg by mouth daily after supper.    . triamcinolone cream (KENALOG) 0.1 % Apply 1 application topically 2 (two) times daily. On abdominal rash only (Patient not taking: Reported on 03/24/2017) 80 g 0  . TRUETRACK TEST test strip FINGERSTICK BLOOD SUGAR TEST ONCE DAILY. 100 each 0   No current facility-administered medications on file prior to visit.     There are no Patient Instructions on file for  this visit. No Follow-up on file.   Yesika Rispoli A Mayvis Agudelo, PA-C

## 2017-05-12 ENCOUNTER — Other Ambulatory Visit: Payer: Self-pay | Admitting: Family Medicine

## 2017-05-12 DIAGNOSIS — E1129 Type 2 diabetes mellitus with other diabetic kidney complication: Secondary | ICD-10-CM

## 2017-05-12 DIAGNOSIS — R809 Proteinuria, unspecified: Principal | ICD-10-CM

## 2017-05-12 NOTE — Telephone Encounter (Signed)
Patient requesting refill of Lisinopril to [facility].

## 2017-05-20 ENCOUNTER — Ambulatory Visit: Payer: Medicare Other | Admitting: Family Medicine

## 2017-05-20 ENCOUNTER — Ambulatory Visit (INDEPENDENT_AMBULATORY_CARE_PROVIDER_SITE_OTHER): Payer: Medicare Other

## 2017-05-20 DIAGNOSIS — Z23 Encounter for immunization: Secondary | ICD-10-CM

## 2017-05-25 ENCOUNTER — Ambulatory Visit (INDEPENDENT_AMBULATORY_CARE_PROVIDER_SITE_OTHER): Payer: Medicare Other | Admitting: Family Medicine

## 2017-05-25 ENCOUNTER — Encounter: Payer: Self-pay | Admitting: Family Medicine

## 2017-05-25 VITALS — BP 110/80 | HR 66 | Resp 14 | Ht 74.0 in | Wt 278.0 lb

## 2017-05-25 DIAGNOSIS — E1129 Type 2 diabetes mellitus with other diabetic kidney complication: Secondary | ICD-10-CM

## 2017-05-25 DIAGNOSIS — Z79899 Other long term (current) drug therapy: Secondary | ICD-10-CM

## 2017-05-25 DIAGNOSIS — F3012 Manic episode without psychotic symptoms, moderate: Secondary | ICD-10-CM | POA: Diagnosis not present

## 2017-05-25 DIAGNOSIS — N50811 Right testicular pain: Secondary | ICD-10-CM | POA: Diagnosis not present

## 2017-05-25 DIAGNOSIS — N478 Other disorders of prepuce: Secondary | ICD-10-CM

## 2017-05-25 DIAGNOSIS — R809 Proteinuria, unspecified: Secondary | ICD-10-CM | POA: Diagnosis not present

## 2017-05-25 DIAGNOSIS — K219 Gastro-esophageal reflux disease without esophagitis: Secondary | ICD-10-CM | POA: Diagnosis not present

## 2017-05-25 DIAGNOSIS — E782 Mixed hyperlipidemia: Secondary | ICD-10-CM

## 2017-05-25 DIAGNOSIS — Z23 Encounter for immunization: Secondary | ICD-10-CM

## 2017-05-25 DIAGNOSIS — I10 Essential (primary) hypertension: Secondary | ICD-10-CM | POA: Diagnosis not present

## 2017-05-25 LAB — POCT GLYCOSYLATED HEMOGLOBIN (HGB A1C): HEMOGLOBIN A1C: 5.6

## 2017-05-25 MED ORDER — OMEPRAZOLE 20 MG PO CPDR: DELAYED_RELEASE_CAPSULE | ORAL | 4 refills | Status: DC

## 2017-05-25 MED ORDER — ATORVASTATIN CALCIUM 10 MG PO TABS: 10.0000 mg | ORAL_TABLET | Freq: Once | ORAL | 5 refills | Status: DC

## 2017-05-25 MED ORDER — LISINOPRIL 20 MG PO TABS: 20.0000 mg | ORAL_TABLET | Freq: Every day | ORAL | 5 refills | Status: DC

## 2017-05-25 NOTE — Progress Notes (Signed)
Name: Wayne Jones   MRN: [id number]    DOB: [birth year]   Date:05/25/2017       Progress Note  Subjective  Chief Complaint  Chief Complaint  Patient presents with  . Diabetes  . Hyperlipidemia  . 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 His hgbA1C is significantly better. Fasting glucose log was not brought to the clinic today.  He is on statin therapy and aspirin. No symptoms of hypoglycemia. The caregiver states diet has improved  Testicular pain; he states going on for a while, but has not said anything to caregiver. He likes to masturbate , no rashes or dysuria  Obesity: weight has been stable,  We ordered a diabetic diet on his last visit. 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 Jacqlyn Larsen, and is taking medication to control symptoms.   Dyslipidemia: taking statin and Lovaza. We will recheck labs   MR: lives in a group, under [facility], stable behavior, no wandering or aggression.   Bipolar disorder: taking medication, caregiver is not sure if he sees psychiatrist, but behavior has been stable, does not seem to be depressed or manic  Edema Lower extremities: he has chronic lower extremity edema, takes diuretics and potassium, he denies leg pain. Discussed leg elevation, we will stop diuretic, resume compression stocking hose  GERD: well controlled, continue medication  OSA: he has been sleeping in the recliner, caregiver states he seems to stay up during the night and sleep at the Day program. He is not using CPAP machine, he does not like the machine  and we will d/c    Patient Active Problem List   Diagnosis Date Noted  . Bilateral lower extremity edema 04/14/2017  . MR (mental retardation) 03/17/2016  . Polypharmacy 01/10/2016  . Benign prostatic hypertrophy without urinary obstruction 01/09/2015  . Chronic kidney disease (CKD), stage I 01/09/2015  . Cognitive decline 01/09/2015  . Controlled type 2 diabetes mellitus with microalbuminuria ([place]) 01/09/2015  . Acid reflux 01/09/2015  . Hearing loss 01/09/2015  . HLD (hyperlipidemia) 01/09/2015  . Extreme obesity 01/09/2015  . Obstructive sleep apnea 01/09/2015  . Other specified causes of urethral stricture 01/09/2015  . Hernia of anterior abdominal wall 01/09/2015  . Difficult or painful urination 07/09/2012  . Incomplete bladder emptying 07/09/2012  . 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 Benign essential HTN 02/04/2007  . Bipolar I disorder, single manic episode, moderate ([place]) 02/04/2007    Past Surgical History:  Procedure Laterality Date  . LAPAROSCOPIC CHOLECYSTECTOMY    . LAPAROSCOPY  12/03/2010  . VENTRAL HERNIA REPAIR      Family History  Problem Relation Age of Onset  . Family history unknown: Yes    Social History   Social History  . Marital status: Single    Spouse name: N/A  . Number of children: N/A  . Years of education: N/A   Occupational History  . Not on file.   Social History Main Topics  . Smoking status: Never Smoker  . Smokeless tobacco: Never Used  .  Alcohol use No  . Drug use: No  . Sexual activity: No   Other Topics Concern  . Not on file   Social History Narrative  . No narrative on file     Current Outpatient Prescriptions:  .  ARIPiprazole (ABILIFY) 20 MG tablet, Take 20 mg by mouth daily., Disp: , Rfl:  .  aspirin 81 MG tablet, Take 1 tablet (81 mg total) by mouth daily. Enteric coated, Disp: 30 tablet, Rfl: 12 .  carbamazepine (EQUETRO) 200 MG CP12 12 hr capsule, Take 1 capsule (200 mg total) by mouth 2 (two) times  daily., Disp: 180 each, Rfl: 3 .  chlorhexidine (PERIDEX) 0.12 % solution, Use as directed 15 mLs in the mouth or throat 2 (two) times daily., Disp: , Rfl:  .  clonazePAM (KLONOPIN) 1 MG tablet, Take 1 mg by mouth daily., Disp: , Rfl:  .  diphenhydrAMINE (SOMINEX) 25 MG tablet, Take 25 mg by mouth at bedtime as needed for sleep., Disp: , Rfl:  .  EAR DROPS 6.5 % otic solution, PLACE 5 DROPS INTO BOTH EARS TWO TIMES A DAY, Disp: 15 mL, Rfl: 10 .  Elastic Bandages & Supports (MEDICAL COMPRESSION SOCKS) MISC, 2 each by Does not apply route daily. Apply in am's and remove it at bedtime, Disp: 2 each, Rfl: 1 .  finasteride (PROSCAR) 5 MG tablet, Take 5 mg by mouth daily., Disp: , Rfl:  .  FLUoxetine (PROZAC) 20 MG capsule, Take 1 capsule (20 mg total) by mouth daily., Disp: 90 capsule, Rfl: 3 .  gabapentin (NEURONTIN) 600 MG tablet, Take 600 mg by mouth at bedtime., Disp: , Rfl:  .  guaiFENesin-dextromethorphan (ROBITUSSIN DM) 100-10 MG/5ML syrup, Take 5 mLs by mouth every 4 (four) hours as needed for cough., Disp: , Rfl:  .  ibuprofen (ADVIL,MOTRIN) 800 MG tablet, Take 800 mg by mouth every 8 (eight) hours as needed., Disp: , Rfl:  .  lisinopril (PRINIVIL,ZESTRIL) 20 MG tablet, Take 1 tablet (20 mg total) by mouth daily., Disp: 30 tablet, Rfl: 5 .  loratadine (CLARITIN) 10 MG tablet, TAKE (1) TABLET BY MOUTH DAILY FOR ALLERGY., Disp: 30 tablet, Rfl: 5 .  nystatin (MYCOSTATIN/NYSTOP) powder, APPLY 3 TIMES A DAY, Disp: 30 g, Rfl: 2 .  omega-3 acid ethyl esters (LOVAZA) 1 g capsule, Take 1 capsule (1 g total) by mouth 2 (two) times daily., Disp: 180 capsule, Rfl: 5 .  omeprazole (PRILOSEC) 20 MG capsule, TAKE 1 CAPSULE BY MOUTH ONCE A DAY.. (G.E.R.D.) DO NOT CRUSH, Disp: 30 capsule, Rfl: 4 .  propranolol ER (INDERAL LA) 60 MG 24 hr capsule, Take 1 capsule (60 mg total) by mouth daily. (Patient taking differently: Take 60 mg by mouth 2 (two) times daily. ), Disp: 90 capsule, Rfl: 3 .  ranitidine (ZANTAC)  150 MG tablet, Take 1 tablet (150 mg total) by mouth at bedtime., Disp: 90 tablet, Rfl: 3 .  tamsulosin (FLOMAX) 0.4 MG CAPS capsule, Take 0.4 mg by mouth daily after supper., Disp: , Rfl:  .  triamcinolone cream (KENALOG) 0.1 %, Apply 1 application topically 2 (two) times daily. On abdominal rash only, Disp: 80 g, Rfl: 0 .  TRUETRACK TEST test strip, FINGERSTICK BLOOD SUGAR TEST ONCE DAILY., Disp: 100 each, Rfl: 0 .  atorvastatin (LIPITOR) 10 MG tablet, Take 1 tablet (10 mg total) by mouth once., Disp: 30 tablet, Rfl: 5  No Known Allergies   ROS  Constitutional: Negative for fever or weight change.  Respiratory: Negative for cough  and shortness of breath.   Cardiovascular: Negative for chest pain or palpitations.  Gastrointestinal: Negative for abdominal pain, no bowel changes.  Musculoskeletal: Negative for gait problem or joint swelling.  Skin: Negative for rash.  Neurological: Negative for dizziness or headache.  No other specific complaints in a complete review of systems (except as listed in HPI above).  Objective  Vitals:   05/25/17 1008  BP: 110/80  Pulse: 66  Resp: 14  SpO2: 98%  Weight: 278 lb (126.1 kg)  Height: 6\' 2"  (1.88 m)    Body mass index is 35.69 kg/m.  Physical Exam  Constitutional: Patient appears well-developed and well-nourished. Obese  No distress.  HEENT: head atraumatic, normocephalic, pupils equal and reactive to light,  neck supple, throat within normal limits Cardiovascular: Normal rate, regular rhythm and normal heart sounds.  No murmur heard. Trace  BLE edema. Pulmonary/Chest: Effort normal and breath sounds normal. No respiratory distress. Abdominal: Soft.  There is no tenderness Urological: normal testicular exam, no hernias, he has thickened and not completely released foreskin ventrally Skin: excoriation on both lower legs, advised to trim finger nails very short and file Psychiatric: Patient has a normal mood and affect. behavior is  normal. Judgment and thought content normal.  Recent Results (from the past 2160 hour(s))  POCT HgB A1C     Status: Abnormal   Collection Time: 05/25/17 10:25 AM  Result Value Ref Range   Hemoglobin A1C 5.6     Diabetic Foot Exam: Diabetic Foot Exam - Simple   Simple Foot Form Diabetic Foot exam was performed with the following findings:  Yes 05/25/2017 10:45 AM  Visual Inspection See comments:  Yes Sensation Testing Intact to touch and monofilament testing bilaterally:  Yes Pulse Check Posterior Tibialis and Dorsalis pulse intact bilaterally:  Yes Comments Thick toe nails      PHQ2/9: Depression screen [facility] 2/9 01/19/2017 03/14/2015  Decreased Interest 0 0  Down, Depressed, Hopeless 0 1  PHQ - 2 Score 0 1     Fall Risk: Fall Risk  05/25/2017 01/19/2017 07/15/2016 03/14/2015 03/14/2015  Falls in the past year? No No No No No      Assessment & Plan  1. Controlled type 2 diabetes mellitus with microalbuminuria, without long-term current use of insulin (HCC)  - POCT HgB A1C - lisinopril (PRINIVIL,ZESTRIL) 20 MG tablet; Take 1 tablet (20 mg total) by mouth daily.  Dispense: 30 tablet; Refill: 5  2. Mixed hyperlipidemia  - atorvastatin (LIPITOR) 10 MG tablet; Take 1 tablet (10 mg total) by mouth once.  Dispense: 30 tablet; Refill: 5 - lipid panel   3. Bipolar I disorder, single manic episode, moderate (HCC)  Continue follow up with Dr. Tamera Punt  4. Benign essential HTN  At goal  - comp panel   5. Morbid obesity, unspecified obesity type [facility])  Discussed importance of regular physical active at the group home  6. Need for vaccination with 13-polyvalent pneumococcal conjugate vaccine  - Pneumococcal conjugate vaccine 13-valent IM  7. Gastroesophageal reflux disease, esophagitis presence not specified  - omeprazole (PRILOSEC) 20 MG capsule; TAKE 1 CAPSULE BY MOUTH ONCE A DAY.. (G.E.R.D.) DO NOT CRUSH  Dispense: 30 capsule; Refill: 4  8. Right testicular  pain  Normal exam, refer to Urologist   9. Foreskin problem  Thickening of foreskin, not completely released, frequent masturbation possible scar tissue -referral Urologist

## 2017-05-26 ENCOUNTER — Encounter (INDEPENDENT_AMBULATORY_CARE_PROVIDER_SITE_OTHER): Payer: Self-pay | Admitting: Vascular Surgery

## 2017-05-26 ENCOUNTER — Ambulatory Visit (INDEPENDENT_AMBULATORY_CARE_PROVIDER_SITE_OTHER): Payer: Medicare Other | Admitting: Vascular Surgery

## 2017-05-26 ENCOUNTER — Ambulatory Visit (INDEPENDENT_AMBULATORY_CARE_PROVIDER_SITE_OTHER): Payer: Medicare Other

## 2017-05-26 VITALS — BP 125/73 | HR 59 | Resp 16 | Wt 279.0 lb

## 2017-05-26 DIAGNOSIS — I83209 Varicose veins of unspecified lower extremity with both ulcer of unspecified site and inflammation: Secondary | ICD-10-CM

## 2017-05-26 DIAGNOSIS — L97909 Non-pressure chronic ulcer of unspecified part of unspecified lower leg with unspecified severity: Secondary | ICD-10-CM | POA: Diagnosis not present

## 2017-05-26 DIAGNOSIS — E7800 Pure hypercholesterolemia, unspecified: Secondary | ICD-10-CM | POA: Diagnosis not present

## 2017-05-26 DIAGNOSIS — R809 Proteinuria, unspecified: Secondary | ICD-10-CM

## 2017-05-26 DIAGNOSIS — E1129 Type 2 diabetes mellitus with other diabetic kidney complication: Secondary | ICD-10-CM | POA: Diagnosis not present

## 2017-05-26 DIAGNOSIS — R600 Localized edema: Secondary | ICD-10-CM

## 2017-05-26 DIAGNOSIS — I10 Essential (primary) hypertension: Secondary | ICD-10-CM | POA: Diagnosis not present

## 2017-05-26 NOTE — Assessment & Plan Note (Signed)
Likely from venous disease 

## 2017-05-26 NOTE — Assessment & Plan Note (Signed)
The patient underwent a venous duplex today which demonstrates bilateral great saphenous vein reflux.  He also has left lower extremities small saphenous vein reflux.  No DVT or superficial thrombus seen.  Given these findings, the patient would benefit from laser ablation of both great saphenous veins as well as the left small saphenous vein to help the wounds heal and avoid recurrence.  Risks and benefits were discussed.  This will be scheduled in the near future.

## 2017-05-26 NOTE — Assessment & Plan Note (Signed)
blood pressure control important in reducing the progression of atherosclerotic disease. On appropriate oral medications.  

## 2017-05-26 NOTE — Assessment & Plan Note (Signed)
blood glucose control important in reducing the progression of atherosclerotic disease. Also, involved in wound healing. On appropriate medications.  

## 2017-05-26 NOTE — Assessment & Plan Note (Signed)
lipid control important in reducing the progression of atherosclerotic disease. Continue statin therapy  

## 2017-05-26 NOTE — Patient Instructions (Signed)
Varicose Vein Surgery, Care After Refer to this sheet in the next few weeks. These instructions provide you with information about caring for yourself after your procedure. Your health care provider may also give you more specific instructions. Your treatment has been planned according to current medical practices, but problems sometimes occur. Call your health care provider if you have any problems or questions after your procedure. What can I expect after the procedure? After your procedure, it is typical to have the following:  Swelling.  Bruising.  Soreness.  Mild skin discoloration.  Slight bleeding at incision sites.  Follow these instructions at home:  Take medicines only as directed by your health care provider.  Wear compression stockings as directed by your health care provider. These stockings help to prevent blood clots and reduce swelling in your legs.  There are many different ways to close and cover an incision, including stitches (sutures), skin glue, and adhesive strips. Follow your health care provider's instructions on: ? Incision care. ? Bandage (dressing) changes and removal. ? Incision closure removal.  Wear loose-fitting clothing.  Get regular daily exercise. Walk or ride a stationary bike daily or as directed by your health care provider.  Ask your health care provider when you can return to work. This may depend on the type of work you do.  Be patient with your recovery. It can take up to 4 weeks to get back to your usual activities. Contact a health care provider if:  You have a fever.  You have drainage, redness, swelling, or pain at an incision site.  You develop a cough. Get help right away if:  You pass out.  You have very bad pain in your leg.  You have leg pain that gets worse when you walk.  You have redness or swelling in your leg that is getting worse.  You have trouble breathing.  You cough up blood. This information is not  intended to replace advice given to you by your health care provider. Make sure you discuss any questions you have with your health care provider. Document Released: 03/24/2014 Document Revised: 12/27/2015 Document Reviewed: 12/28/2013 Elsevier Interactive Patient Education  2018 Elsevier Inc.  

## 2017-05-26 NOTE — Progress Notes (Signed)
MRN : [id number]  Wayne Jones is a 56 y.o. (01-07-1961) male who presents with chief complaint of  Chief Complaint  Patient presents with  . Follow-up    pt conv bil le ven reflux  .  History of Present Illness: Patient returns today in follow up of lower extremity edema.  The patient has developed bilateral lower extremity ulcerations on both medial lower calf areas.  These are basically dime sized areas of scabs.  There is no surrounding erythema.  There is no purulent drainage.  The patient is a very poor historian but these do not appear to be overtly painful.  His swelling is about the same.  Current Outpatient Prescriptions  Medication Sig Dispense Refill  . ARIPiprazole (ABILIFY) 20 MG tablet Take 20 mg by mouth daily.    Marland Kitchen aspirin 81 MG tablet Take 1 tablet (81 mg total) by mouth daily. Enteric coated 30 tablet 12  . carbamazepine (EQUETRO) 200 MG CP12 12 hr capsule Take 1 capsule (200 mg total) by mouth 2 (two) times daily. 180 each 3  . chlorhexidine (PERIDEX) 0.12 % solution Use as directed 15 mLs in the mouth or throat 2 (two) times daily.    . clonazePAM (KLONOPIN) 1 MG tablet Take 1 mg by mouth daily.    . diphenhydrAMINE (SOMINEX) 25 MG tablet Take 25 mg by mouth at bedtime as needed for sleep.    Marland Kitchen EAR DROPS 6.5 % otic solution PLACE 5 DROPS INTO BOTH EARS TWO TIMES A DAY 15 mL 10  . Elastic Bandages & Supports (MEDICAL COMPRESSION SOCKS) MISC 2 each by Does not apply route daily. Apply in am's and remove it at bedtime 2 each 1  . finasteride (PROSCAR) 5 MG tablet Take 5 mg by mouth daily.    Marland Kitchen FLUoxetine (PROZAC) 20 MG capsule Take 1 capsule (20 mg total) by mouth daily. 90 capsule 3  . gabapentin (NEURONTIN) 600 MG tablet Take 600 mg by mouth at bedtime.    Marland Kitchen guaiFENesin-dextromethorphan (ROBITUSSIN DM) 100-10 MG/5ML syrup Take 5 mLs by mouth every 4 (four) hours as needed for cough.    Marland Kitchen ibuprofen (ADVIL,MOTRIN) 800 MG tablet Take 800 mg by mouth every 8 (eight)  hours as needed.    Marland Kitchen lisinopril (PRINIVIL,ZESTRIL) 20 MG tablet Take 1 tablet (20 mg total) by mouth daily. 30 tablet 5  . loratadine (CLARITIN) 10 MG tablet TAKE (1) TABLET BY MOUTH DAILY FOR ALLERGY. 30 tablet 5  . nystatin (MYCOSTATIN/NYSTOP) powder APPLY 3 TIMES A DAY 30 g 2  . omega-3 acid ethyl esters (LOVAZA) 1 g capsule Take 1 capsule (1 g total) by mouth 2 (two) times daily. 180 capsule 5  . omeprazole (PRILOSEC) 20 MG capsule TAKE 1 CAPSULE BY MOUTH ONCE A DAY.. (G.E.R.D.) DO NOT CRUSH 30 capsule 4  . propranolol ER (INDERAL LA) 60 MG 24 hr capsule Take 1 capsule (60 mg total) by mouth daily. (Patient taking differently: Take 60 mg by mouth 2 (two) times daily. ) 90 capsule 3  . ranitidine (ZANTAC) 150 MG tablet Take 1 tablet (150 mg total) by mouth at bedtime. 90 tablet 3  . tamsulosin (FLOMAX) 0.4 MG CAPS capsule Take 0.4 mg by mouth daily after supper.    . triamcinolone cream (KENALOG) 0.1 % Apply 1 application topically 2 (two) times daily. On abdominal rash only 80 g 0  . TRUETRACK TEST test strip FINGERSTICK BLOOD SUGAR TEST ONCE DAILY. 100 each 0  . atorvastatin (LIPITOR)  10 MG tablet Take 1 tablet (10 mg total) by mouth once. 30 tablet 5   No current facility-administered medications for this visit.     Past Medical History:  Diagnosis Date  . Depression   . Diabetes mellitus without complication ([place])   . Hyperlipidemia   . Hypertension   . Mental disorder     Past Surgical History:  Procedure Laterality Date  . LAPAROSCOPIC CHOLECYSTECTOMY    . LAPAROSCOPY  12/03/2010  . VENTRAL HERNIA REPAIR      Social History Social History  Substance Use Topics  . Smoking status: Never Smoker  . Smokeless tobacco: Never Used  . Alcohol use No   Lives in a group home  Family History Family History  Problem Relation Age of Onset  . Family history unknown: Yes     No Known Allergies   REVIEW OF SYSTEMS (Negative unless checked)  Constitutional: [] Weight loss   [] Fever  [] Chills Cardiac: [] Chest pain   [] Chest pressure   [] Palpitations   [] Shortness of breath when laying flat   [] Shortness of breath at rest   [] Shortness of breath with exertion. Vascular:  [] Pain in legs with walking   [] Pain in legs at rest   [] Pain in legs when laying flat   [] Claudication   [] Pain in feet when walking  [] Pain in feet at rest  [] Pain in feet when laying flat   [] History of DVT   [] Phlebitis   [x] Swelling in legs   [x] Varicose veins   [] Non-healing ulcers Pulmonary:   [] Uses home oxygen   [] Productive cough   [] Hemoptysis   [] Wheeze  [] COPD   [] Asthma Neurologic:  [] Dizziness  [] Blackouts   [] Seizures   [] History of stroke   [] History of TIA  [] Aphasia   [] Temporary blindness   [] Dysphagia   [] Weakness or numbness in arms   [] Weakness or numbness in legs Musculoskeletal:  [] Arthritis   [x] Joint swelling   [] Joint pain   [] Low back pain Hematologic:  [] Easy bruising  [] Easy bleeding   [] Hypercoagulable state   [] Anemic   Gastrointestinal:  [] Blood in stool   [] Vomiting blood  [x] Gastroesophageal reflux/heartburn   [] Abdominal pain Genitourinary:  [] Chronic kidney disease   [] Difficult urination  [] Frequent urination  [] Burning with urination   [] Hematuria Skin:  [x] Rashes   [x] Ulcers   [x] Wounds Psychological:  [] History of anxiety   []  History of major depression.  Physical Examination  BP 125/73 (BP Location: Left Arm)   Pulse (!) 59   Resp 16   Wt 126.6 kg (279 lb)   BMI 35.82 kg/m  Gen:  WD/WN, NAD. Obese Head: [place]/AT, No temporalis wasting. Ear/Nose/Throat: Hearing grossly intact, nares w/o erythema or drainage, trachea midline Eyes: Conjunctiva clear. Sclera non-icteric Neck: Supple.  No JVD.  Pulmonary:  Good air movement, no use of accessory muscles.  Cardiac: RRR, normal S1, S2 Vascular:  Vessel Right Left  Radial Palpable Palpable                                    Musculoskeletal: M/S 5/5 throughout.  No deformity or atrophy. 1-2+ BLE  edema. Neurologic: Sensation grossly intact in extremities.  Symmetrical.  Speech is fluent.  Psychiatric: Judgment intact, Mood & affect appropriate for pt's clinical situation. Dermatologic: On the right, he has a dime size ulceration about 8-10 cm above the medial malleolus of the ankle.  On the left, he has 2 dime  size ulcerations at a similar level on the medial lower leg      Labs Recent Results (from the past 2160 hour(s))  POCT HgB A1C     Status: Abnormal   Collection Time: 05/25/17 10:25 AM  Result Value Ref Range   Hemoglobin A1C 5.6     Radiology No results found.    Assessment/Plan  HLD (hyperlipidemia) lipid control important in reducing the progression of atherosclerotic disease. Continue statin therapy   Bilateral lower extremity edema Likely from venous disease.  Benign essential HTN blood pressure control important in reducing the progression of atherosclerotic disease. On appropriate oral medications.   Controlled type 2 diabetes mellitus with microalbuminuria (HCC) blood glucose control important in reducing the progression of atherosclerotic disease. Also, involved in wound healing. On appropriate medications.   Varicose veins, lower extremity, with inflammation, ulcerated, unspecified laterality ([place]) The patient underwent a venous duplex today which demonstrates bilateral great saphenous vein reflux.  He also has left lower extremities small saphenous vein reflux.  No DVT or superficial thrombus seen.  Given these findings, the patient would benefit from laser ablation of both great saphenous veins as well as the left small saphenous vein to help the wounds heal and avoid recurrence.  Risks and benefits were discussed.  This will be scheduled in the near future.    Leotis Pain, MD  05/26/2017 3:26 PM    This note was created with Dragon medical transcription system.  Any errors from dictation are purely unintentional

## 2017-05-27 ENCOUNTER — Ambulatory Visit: Payer: Medicare Other | Admitting: Family Medicine

## 2017-06-05 DIAGNOSIS — E782 Mixed hyperlipidemia: Secondary | ICD-10-CM | POA: Diagnosis not present

## 2017-06-05 DIAGNOSIS — Z79899 Other long term (current) drug therapy: Secondary | ICD-10-CM | POA: Diagnosis not present

## 2017-06-05 LAB — CBC WITH DIFFERENTIAL/PLATELET
BASOS PCT: 0.3 %
Basophils Absolute: 19 cells/uL (ref 0–200)
EOS ABS: 139 {cells}/uL (ref 15–500)
EOS PCT: 2.2 %
HCT: 43.3 % (ref 38.5–50.0)
Hemoglobin: 15 g/dL (ref 13.2–17.1)
Lymphs Abs: 1386 cells/uL (ref 850–3900)
MCH: 31.3 pg (ref 27.0–33.0)
MCHC: 34.6 g/dL (ref 32.0–36.0)
MCV: 90.2 fL (ref 80.0–100.0)
MONOS PCT: 7 %
MPV: 11.8 fL (ref 7.5–12.5)
NEUTROS ABS: 4316 {cells}/uL (ref 1500–7800)
Neutrophils Relative %: 68.5 %
PLATELETS: 111 10*3/uL — AB (ref 140–400)
RBC: 4.8 10*6/uL (ref 4.20–5.80)
RDW: 12.7 % (ref 11.0–15.0)
TOTAL LYMPHOCYTE: 22 %
WBC mixed population: 441 cells/uL (ref 200–950)
WBC: 6.3 10*3/uL (ref 3.8–10.8)

## 2017-06-05 LAB — COMPLETE METABOLIC PANEL WITH GFR
AG RATIO: 1.9 (calc) (ref 1.0–2.5)
ALT: 17 U/L (ref 9–46)
AST: 14 U/L (ref 10–35)
Albumin: 4.1 g/dL (ref 3.6–5.1)
Alkaline phosphatase (APISO): 94 U/L (ref 40–115)
BILIRUBIN TOTAL: 0.5 mg/dL (ref 0.2–1.2)
BUN: 13 mg/dL (ref 7–25)
CALCIUM: 8.7 mg/dL (ref 8.6–10.3)
CHLORIDE: 104 mmol/L (ref 98–110)
CO2: 29 mmol/L (ref 20–32)
Creat: 0.81 mg/dL (ref 0.70–1.33)
GFR, Est African American: 115 mL/min/{1.73_m2} (ref >=60)
GFR, Est Non African American: 99 mL/min/{1.73_m2} (ref >=60)
GLUCOSE: 110 mg/dL — AB (ref 65–99)
Globulin: 2.2 g/dL (calc) (ref 1.9–3.7)
POTASSIUM: 4.2 mmol/L (ref 3.5–5.3)
Sodium: 142 mmol/L (ref 135–146)
Total Protein: 6.3 g/dL (ref 6.1–8.1)

## 2017-06-05 LAB — LIPID PANEL
CHOLESTEROL: 128 mg/dL (ref <200)
HDL: 47 mg/dL (ref >40)
LDL CHOLESTEROL (CALC): 62 mg/dL
Non-HDL Cholesterol (Calc): 81 mg/dL (calc) (ref <130)
TRIGLYCERIDES: 100 mg/dL (ref <150)
Total CHOL/HDL Ratio: 2.7 (calc) (ref <5.0)

## 2017-07-13 ENCOUNTER — Ambulatory Visit: Payer: Self-pay | Admitting: Podiatry

## 2017-07-20 ENCOUNTER — Encounter: Payer: Self-pay | Admitting: Podiatry

## 2017-07-20 ENCOUNTER — Ambulatory Visit (INDEPENDENT_AMBULATORY_CARE_PROVIDER_SITE_OTHER): Payer: Medicare Other | Admitting: Podiatry

## 2017-07-20 DIAGNOSIS — B351 Tinea unguium: Secondary | ICD-10-CM | POA: Diagnosis not present

## 2017-07-20 DIAGNOSIS — M79675 Pain in left toe(s): Secondary | ICD-10-CM

## 2017-07-20 DIAGNOSIS — M79674 Pain in right toe(s): Secondary | ICD-10-CM | POA: Diagnosis not present

## 2017-07-20 DIAGNOSIS — E119 Type 2 diabetes mellitus without complications: Secondary | ICD-10-CM

## 2017-07-20 NOTE — Progress Notes (Signed)
Complaint:  Visit Type: Patient returns to my office for continued preventative foot care services. Complaint: Patient states" my nails have grown long and thick and become painful to walk and wear shoes"  The patient presents for preventative foot care services. No changes to ROS.  Patient is diabetic with no foot complications. Patient is diabetic.    Podiatric Exam: Vascular: dorsalis pedis and posterior tibial pulses are palpable bilateral. Capillary return is immediate. Temperature gradient is WNL. Skin turgor WNL  Sensorium: Normal Semmes Weinstein monofilament test. Normal tactile sensation bilaterally. Nail Exam: Pt has thick disfigured discolored nails with subungual debris noted bilateral entire nail hallux through fifth toenails.  Previous nail surgery both hallux nails. Ulcer Exam: There is no evidence of ulcer or pre-ulcerative changes or infection. Orthopedic Exam: Muscle tone and strength are WNL. No limitations in general ROM. No crepitus or effusions noted. Foot type and digits show no abnormalities. Bony prominences are unremarkable. Skin: No Porokeratosis. No infection or ulcers  Diagnosis:  Onychomycosis, , Pain in right toe, pain in left toes  Treatment & Plan Procedures and Treatment: Consent by patient was obtained for treatment procedures. The patient understood the discussion of treatment and procedures well. All questions were answered thoroughly reviewed. Debridement of mycotic and hypertrophic toenails, 1 through 5 bilateral and clearing of subungual debris. No ulceration, no infection noted.  Return Visit-Office Procedure: Patient instructed to return to the office for a follow up visit 3 months for continued evaluation and treatment.    Gardiner Barefoot DPM

## 2017-07-23 ENCOUNTER — Ambulatory Visit: Payer: Self-pay | Admitting: Podiatry

## 2017-08-11 ENCOUNTER — Other Ambulatory Visit (INDEPENDENT_AMBULATORY_CARE_PROVIDER_SITE_OTHER): Payer: Self-pay | Admitting: Vascular Surgery

## 2017-08-13 ENCOUNTER — Emergency Department
Admission: EM | Admit: 2017-08-13 | Discharge: 2017-08-13 | Disposition: A | Discharge status: Home / Self Care | Payer: Medicare Other | Attending: Emergency Medicine | Admitting: Emergency Medicine

## 2017-08-13 ENCOUNTER — Encounter: Payer: Self-pay | Admitting: Emergency Medicine

## 2017-08-13 ENCOUNTER — Other Ambulatory Visit: Payer: Self-pay

## 2017-08-13 DIAGNOSIS — Z79899 Other long term (current) drug therapy: Secondary | ICD-10-CM | POA: Insufficient documentation

## 2017-08-13 DIAGNOSIS — I8391 Asymptomatic varicose veins of right lower extremity: Secondary | ICD-10-CM | POA: Diagnosis not present

## 2017-08-13 DIAGNOSIS — I10 Essential (primary) hypertension: Secondary | ICD-10-CM | POA: Diagnosis not present

## 2017-08-13 DIAGNOSIS — E119 Type 2 diabetes mellitus without complications: Secondary | ICD-10-CM | POA: Insufficient documentation

## 2017-08-13 DIAGNOSIS — F71 Moderate intellectual disabilities: Secondary | ICD-10-CM | POA: Insufficient documentation

## 2017-08-13 DIAGNOSIS — Z7982 Long term (current) use of aspirin: Secondary | ICD-10-CM | POA: Insufficient documentation

## 2017-08-13 DIAGNOSIS — Z23 Encounter for immunization: Secondary | ICD-10-CM | POA: Insufficient documentation

## 2017-08-13 DIAGNOSIS — I83891 Varicose veins of right lower extremities with other complications: Secondary | ICD-10-CM | POA: Diagnosis not present

## 2017-08-13 MED ORDER — TETANUS-DIPHTH-ACELL PERTUSSIS 5-2.5-18.5 LF-MCG/0.5 IM SUSP
INTRAMUSCULAR | Status: AC
  Administered 2017-08-13: 0.5 mL via INTRAMUSCULAR
  Filled 2017-08-13: qty 0.5

## 2017-08-13 MED ORDER — TETANUS-DIPHTH-ACELL PERTUSSIS 5-2.5-18.5 LF-MCG/0.5 IM SUSP
0.5000 mL | Freq: Once | INTRAMUSCULAR | Status: AC
  Administered 2017-08-13: 0.5 mL via INTRAMUSCULAR

## 2017-08-13 NOTE — ED Notes (Signed)
MD placed glue on skin at this time, ace wrap applied.

## 2017-08-13 NOTE — ED Triage Notes (Signed)
Pt arrived with group home staff with reports of unknown cause of bleeding to lower right leg. Pt presents with dressing to lower right leg. Upon removal of dressing blood immediately spurted from wound to lower right leg. Wound redressed and bleeding controlled. Pt ambulatory to triage.

## 2017-08-13 NOTE — Discharge Instructions (Addendum)
Small bleeding spot on leg seems to be due to varicose vein or leg edema and skin glue plus Ace wrap was applied.  May use Ace wrap as needed for 14 hours to keep mild pressure to the area.  You were given tetanus shot today, updated (discuss with your doctor, but likely good for about 5 years).  Return to ER for any worsening condition such as bleeding that does not stop with pressure.

## 2017-08-13 NOTE — ED Notes (Signed)
Pt caregiver , kevin, from group home verbalizes d/c understanding. Pt in NAD at time of d/c, pt ambulatory/

## 2017-08-13 NOTE — ED Provider Notes (Signed)
[facility] Emergency Department Provider Note ____________________________________________   I have reviewed the triage vital signs and the triage nursing note.  HISTORY  Chief Complaint Extremity Laceration   Historian Level 5 Caveat History Limited by poor historian due to underlying mental condition From a group home with the group home representative who provides history  HPI Wayne Jones is a 57 y.o. male brought in by group home staff for bleeding from right lower leg.  No known trauma.  No pain.  Moderate.  Noted/started this morning.  Unknown last tetanus shot.    Chart review notes varicose veins.  Med review, on aspirin.       Past Medical History:  Diagnosis Date  . Depression   . Diabetes mellitus without complication ([place])   . Hyperlipidemia   . Hypertension   . Mental disorder     Patient Active Problem List   Diagnosis Date Noted  . Varicose veins, lower extremity, with inflammation, ulcerated, unspecified laterality ([place]) 05/26/2017  . Bilateral lower extremity edema 04/14/2017  . MR (mental retardation) 03/17/2016  . Polypharmacy 01/10/2016  . Benign prostatic hypertrophy without urinary obstruction 01/09/2015  . Chronic kidney disease (CKD), stage I 01/09/2015  . Cognitive decline 01/09/2015  . Controlled type 2 diabetes mellitus with microalbuminuria ([place]) 01/09/2015  . Acid reflux 01/09/2015  . Hearing loss 01/09/2015  . HLD (hyperlipidemia) 01/09/2015  . Extreme obesity 01/09/2015  . Obstructive sleep apnea 01/09/2015  . Other specified causes of urethral stricture 01/09/2015  . Hernia of anterior abdominal wall 01/09/2015  . Incomplete bladder emptying 07/09/2012  . 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 Benign essential HTN 02/04/2007  . Bipolar I disorder, single manic episode, moderate ([place]) 02/04/2007    Past Surgical History:  Procedure Laterality Date  . LAPAROSCOPIC CHOLECYSTECTOMY    . LAPAROSCOPY   12/03/2010  . VENTRAL HERNIA REPAIR      Prior to Admission medications   Medication Sig Start Date End Date Taking? Authorizing Provider  ARIPiprazole (ABILIFY) 20 MG tablet Take 20 mg by mouth daily.    [provider]  aspirin 81 MG tablet Take 1 tablet (81 mg total) by mouth daily. Enteric coated 02/16/17   Steele Sizer, MD  atorvastatin (LIPITOR) 10 MG tablet Take 1 tablet (10 mg total) by mouth once. 05/25/17 05/25/17  Steele Sizer, MD  carbamazepine (EQUETRO) 200 MG CP12 12 hr capsule Take 1 capsule (200 mg total) by mouth 2 (two) times daily. 01/10/16   Plonk, Gwyndolyn Saxon, MD  chlorhexidine (PERIDEX) 0.12 % solution Use as directed 15 mLs in the mouth or throat 2 (two) times daily.    [provider]  clonazePAM (KLONOPIN) 1 MG tablet Take 1 mg by mouth daily.    [provider]  diphenhydrAMINE (SOMINEX) 25 MG tablet Take 25 mg by mouth at bedtime as needed for sleep.    [provider]  EAR DROPS 6.5 % otic solution PLACE 5 DROPS INTO BOTH EARS TWO TIMES A DAY 11/21/16   Steele Sizer, MD  Elastic Bandages & Supports (MEDICAL COMPRESSION SOCKS) MISC 2 each by Does not apply route daily. Apply in am's and remove it at bedtime 01/19/17   Steele Sizer, MD  finasteride (PROSCAR) 5 MG tablet Take 5 mg by mouth daily.    [provider]  FLUoxetine (PROZAC) 20 MG capsule Take 1 capsule (20 mg total) by mouth daily. 10/13/16   Steele Sizer, MD  gabapentin (NEURONTIN) 600 MG tablet Take 600 mg by  mouth at bedtime.    [provider]  guaiFENesin-dextromethorphan (ROBITUSSIN DM) 100-10 MG/5ML syrup Take 5 mLs by mouth every 4 (four) hours as needed for cough.    [provider]  ibuprofen (ADVIL,MOTRIN) 800 MG tablet Take 800 mg by mouth every 8 (eight) hours as needed.    [provider]  lisinopril (PRINIVIL,ZESTRIL) 20 MG tablet Take 1 tablet (20 mg total) by mouth daily. 05/25/17   Steele Sizer, MD  loratadine  (CLARITIN) 10 MG tablet TAKE (1) TABLET BY MOUTH DAILY FOR ALLERGY. 02/16/17   Steele Sizer, MD  nystatin (MYCOSTATIN/NYSTOP) powder APPLY 3 TIMES A DAY 05/07/16   Sowles, Drue Stager, MD  omega-3 acid ethyl esters (LOVAZA) 1 g capsule Take 1 capsule (1 g total) by mouth 2 (two) times daily. 01/19/17   Steele Sizer, MD  omeprazole (PRILOSEC) 20 MG capsule TAKE 1 CAPSULE BY MOUTH ONCE A DAY.. (G.E.R.D.) DO NOT CRUSH 05/25/17   Steele Sizer, MD  propranolol ER (INDERAL LA) 60 MG 24 hr capsule Take 1 capsule (60 mg total) by mouth daily. Patient taking differently: Take 60 mg by mouth 2 (two) times daily.  10/13/16   Steele Sizer, MD  ranitidine (ZANTAC) 150 MG tablet Take 1 tablet (150 mg total) by mouth at bedtime. 10/13/16   Steele Sizer, MD  tamsulosin (FLOMAX) 0.4 MG CAPS capsule Take 0.4 mg by mouth daily after supper.    [provider]  triamcinolone cream (KENALOG) 0.1 % Apply 1 application topically 2 (two) times daily. On abdominal rash only 03/04/17   Steele Sizer, MD  Angelia Mould TEST test strip FINGERSTICK BLOOD SUGAR TEST ONCE DAILY. 08/06/15   Ashok Norris, MD    No Known Allergies  Family History  Family history unknown: Yes    Social History Social History   Tobacco Use  . Smoking status: Never Smoker  . Smokeless tobacco: Never Used  Substance Use Topics  . Alcohol use: No    Alcohol/week: 0.0 oz  . Drug use: No    Review of Systems  Constitutional: Negative for recent illness. Eyes: Negative for visual changes. ENT: Negative for sore throat. Cardiovascular: Negative for chest pain. Respiratory: Negative for shortness of breath. Gastrointestinal: Negative for abdominal pain, vomiting and diarrhea. Genitourinary: Negative for dysuria. Musculoskeletal: Negative for back pain. Skin: Negative for rash. Neurological: Negative for headache.  ____________________________________________   PHYSICAL EXAM:  VITAL SIGNS: ED Triage Vitals  Enc  Vitals Group     BP 08/13/17 0717 (!) 169/80     Pulse Rate 08/13/17 0717 (!) 51     Resp 08/13/17 0717 18     Temp 08/13/17 0717 97.6 F (36.4 C)     Temp Source 08/13/17 0717 Axillary     SpO2 08/13/17 0717 100 %     Weight 08/13/17 0715 279 lb (126.6 kg)     Height --      Head Circumference --      Peak Flow --      Pain Score --      Pain Loc --      Pain Edu? --      Excl. in [place]? --      Constitutional: Alert and cooperative, poor historian. Well appearing and in no distress. HEENT   Head: Normocephalic and atraumatic.      Eyes: Conjunctivae are normal. Pupils equal and round.       Ears:         Nose: No congestion/rhinnorhea.   Mouth/Throat:  Mucous membranes are moist.   Neck: No stridor. Cardiovascular/Chest: Normal rate, regular rhythm.  No murmurs, rubs, or gallops. Respiratory: Normal respiratory effort without tachypnea nor retractions. Breath sounds are clear and equal bilaterally. No wheezes/rales/rhonchi. Gastrointestinal: Soft. No distention, no guarding, no rebound. Nontender.    Genitourinary/rectal:Deferred Musculoskeletal: Nontender with normal range of motion in all extremities. No joint effusions.  No lower extremity tenderness.  1+ lower extremity edema bilateral lower extremities.  Right medial lower leg with small skin ulceration/ varicose vein opening that is hemostatic after taking down mild pressure dressing placed in triage. Neurologic:  Normal speech and language. No gross or focal neurologic deficits are appreciated. Skin:  Skin is warm, dry and intact. No rash noted.  Bleeding varicose vein vs. Skin breakdown with edema bleeding -- right lower extremity as above. Psychiatric: Mood and affect are normal. Speech and behavior are normal. Patient exhibits appropriate insight and judgment.   ____________________________________________  LABS (pertinent positives/negatives) I, Lisa Roca, MD the attending physician have reviewed the labs  noted below.  Labs Reviewed - No data to display  ____________________________________________    EKG I, Lisa Roca, MD, the attending physician have personally viewed and interpreted all ECGs.  None ____________________________________________  RADIOLOGY All Xrays were viewed by me.  Imaging interpreted by Radiologist, and I, Lisa Roca, MD the attending physician have reviewed the radiologist interpretation noted below.  None __________________________________________  PROCEDURES  Procedure(s) performed: Bleeding varicose vein repair  By myself, Dr. Lisa Roca MD Skin cleaned and dried. Dermabond applied. Ace wrap applied  Critical Care performed: None   ____________________________________________  ED COURSE / ASSESSMENT AND PLAN  Pertinent labs & imaging results that were available during my care of the patient were reviewed by me and considered in my medical decision making (see chart for details).    Source of bleeding small vericose vein, placed glue and ace wrap.  Uncertain of tetanus status, so was updated today.    CONSULTATIONS:   None   Patient / Family / Caregiver informed of clinical course, medical decision-making process, and agree with plan.   I discussed return precautions, follow-up instructions, and discharge instructions with patient and/or family.  Discharge Instructions : Small bleeding spot on leg seems to be due to varicose vein or leg edema and skin glue plus Ace wrap was applied.  May use Ace wrap as needed for 14 hours to keep mild pressure to the area.  You were given tetanus shot today, updated (discuss with your doctor, but likely good for about 5 years).  Return to ER for any worsening condition such as bleeding that does not stop with pressure.    ___________________________________________   FINAL CLINICAL IMPRESSION(S) / ED DIAGNOSES   Final diagnoses:  Bleeding from varicose veins of right lower extremity       ___________________________________________        Note: This dictation was prepared with Dragon dictation. Any transcriptional errors that result from this process are unintentional    Lisa Roca, MD 08/13/17 [phone]

## 2017-08-13 NOTE — ED Notes (Addendum)
Legal guardian ,Vicente Males, brother in law attempted to contact at this time with no answer at [phone]

## 2017-08-13 NOTE — ED Notes (Addendum)
PT from group home ([facility]) with staff , bleeding to LFT lower extrem from unknown cause. Bleeding controlled at this time. No laceration noted. MD at bedside . Pt in NAD

## 2017-08-14 ENCOUNTER — Encounter (INDEPENDENT_AMBULATORY_CARE_PROVIDER_SITE_OTHER): Payer: Self-pay

## 2017-08-20 ENCOUNTER — Encounter: Payer: Self-pay | Admitting: Family Medicine

## 2017-08-20 ENCOUNTER — Ambulatory Visit (INDEPENDENT_AMBULATORY_CARE_PROVIDER_SITE_OTHER): Payer: Medicare Other | Admitting: Family Medicine

## 2017-08-20 VITALS — BP 130/76 | HR 63 | Temp 98.3°F | Resp 16 | Ht 74.0 in | Wt 272.8 lb

## 2017-08-20 DIAGNOSIS — D696 Thrombocytopenia, unspecified: Secondary | ICD-10-CM | POA: Diagnosis not present

## 2017-08-20 DIAGNOSIS — I83209 Varicose veins of unspecified lower extremity with both ulcer of unspecified site and inflammation: Secondary | ICD-10-CM | POA: Diagnosis not present

## 2017-08-20 DIAGNOSIS — L97909 Non-pressure chronic ulcer of unspecified part of unspecified lower leg with unspecified severity: Secondary | ICD-10-CM | POA: Diagnosis not present

## 2017-08-20 NOTE — Progress Notes (Signed)
Name: Wayne Jones   MRN: [id number]    DOB: 27-Apr-1961   Date:08/20/2017       Progress Note  Subjective  Chief Complaint  Chief Complaint  Patient presents with  . Leg Problem    Going to see [facility] and having treatments in office on Feb. 12 at 10 a.m. and Feb a.m. at 11:15 a.m. Having red spots on right leg. Following up from Hospital.     HPI  Varicose veins: went to [facility] on 08/13/2017 because he picked on his right leg and started to bleed heavily, at the [facility] he was given the diagnosis of bleeding secondary to varicose vein. He has already seen Dr. Lucky Cowboy ( vascular surgeon ) in the past and is scheduled to go back for varicose laser treatment next month. He has long nails,I advised caregiver [place] , to keep nails very short and smooth. They have been changing dressing at the group home and no longer has any bleeding, he does not seem to be in pain.   Patient Active Problem List   Diagnosis Date Noted  . Varicose veins, lower extremity, with inflammation, ulcerated, unspecified laterality ([place]) 05/26/2017  . Bilateral lower extremity edema 04/14/2017  . MR (mental retardation) 03/17/2016  . Polypharmacy 01/10/2016  . Benign prostatic hypertrophy without urinary obstruction 01/09/2015  . Chronic kidney disease (CKD), stage I 01/09/2015  . Cognitive decline 01/09/2015  . Controlled type 2 diabetes mellitus with microalbuminuria ([place]) 01/09/2015  . Acid reflux 01/09/2015  . Hearing loss 01/09/2015  . HLD (hyperlipidemia) 01/09/2015  . Extreme obesity 01/09/2015  . Obstructive sleep apnea 01/09/2015  . Other specified causes of urethral stricture 01/09/2015  . Hernia of anterior abdominal wall 01/09/2015  . Incomplete bladder emptying 07/09/2012  . 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 Benign essential HTN 02/04/2007  . Bipolar I disorder, single manic episode, moderate ([place]) 02/04/2007    Social History   Tobacco Use  . Smoking status: Never Smoker  .  Smokeless tobacco: Never Used  Substance Use Topics  . Alcohol use: No    Alcohol/week: 0.0 oz     Current Outpatient Medications:  .  ARIPiprazole (ABILIFY) 20 MG tablet, Take 20 mg by mouth daily., Disp: , Rfl:  .  aspirin 81 MG tablet, Take 1 tablet (81 mg total) by mouth daily. Enteric coated, Disp: 30 tablet, Rfl: 12 .  carbamazepine (EQUETRO) 200 MG CP12 12 hr capsule, Take 1 capsule (200 mg total) by mouth 2 (two) times daily., Disp: 180 each, Rfl: 3 .  chlorhexidine (PERIDEX) 0.12 % solution, Use as directed 15 mLs in the mouth or throat 2 (two) times daily., Disp: , Rfl:  .  clonazePAM (KLONOPIN) 1 MG tablet, Take 1 mg by mouth daily., Disp: , Rfl:  .  diphenhydrAMINE (SOMINEX) 25 MG tablet, Take 25 mg by mouth at bedtime as needed for sleep., Disp: , Rfl:  .  EAR DROPS 6.5 % otic solution, PLACE 5 DROPS INTO BOTH EARS TWO TIMES A DAY, Disp: 15 mL, Rfl: 10 .  Elastic Bandages & Supports (MEDICAL COMPRESSION SOCKS) MISC, 2 each by Does not apply route daily. Apply in am's and remove it at bedtime, Disp: 2 each, Rfl: 1 .  finasteride (PROSCAR) 5 MG tablet, Take 5 mg by mouth daily., Disp: , Rfl:  .  FLUoxetine (PROZAC) 20 MG capsule, Take 1 capsule (20 mg total) by mouth daily., Disp: 90 capsule, Rfl: 3 .  gabapentin (NEURONTIN) 600 MG tablet, Take  600 mg by mouth at bedtime., Disp: , Rfl:  .  guaiFENesin-dextromethorphan (ROBITUSSIN DM) 100-10 MG/5ML syrup, Take 5 mLs by mouth every 4 (four) hours as needed for cough., Disp: , Rfl:  .  ibuprofen (ADVIL,MOTRIN) 800 MG tablet, Take 800 mg by mouth every 8 (eight) hours as needed., Disp: , Rfl:  .  lisinopril (PRINIVIL,ZESTRIL) 20 MG tablet, Take 1 tablet (20 mg total) by mouth daily., Disp: 30 tablet, Rfl: 5 .  loratadine (CLARITIN) 10 MG tablet, TAKE (1) TABLET BY MOUTH DAILY FOR ALLERGY., Disp: 30 tablet, Rfl: 5 .  nystatin (MYCOSTATIN/NYSTOP) powder, APPLY 3 TIMES A DAY, Disp: 30 g, Rfl: 2 .  omega-3 acid ethyl esters (LOVAZA) 1 g  capsule, Take 1 capsule (1 g total) by mouth 2 (two) times daily., Disp: 180 capsule, Rfl: 5 .  omeprazole (PRILOSEC) 20 MG capsule, TAKE 1 CAPSULE BY MOUTH ONCE A DAY.. (G.E.R.D.) DO NOT CRUSH, Disp: 30 capsule, Rfl: 4 .  propranolol ER (INDERAL LA) 60 MG 24 hr capsule, Take 1 capsule (60 mg total) by mouth daily. (Patient taking differently: Take 60 mg by mouth 2 (two) times daily. ), Disp: 90 capsule, Rfl: 3 .  ranitidine (ZANTAC) 150 MG tablet, Take 1 tablet (150 mg total) by mouth at bedtime., Disp: 90 tablet, Rfl: 3 .  silver sulfADIAZINE (SILVADENE) 1 % cream, Apply 1 application topically daily., Disp: , Rfl:  .  tamsulosin (FLOMAX) 0.4 MG CAPS capsule, Take 0.4 mg by mouth daily after supper., Disp: , Rfl:  .  triamcinolone cream (KENALOG) 0.1 %, Apply 1 application topically 2 (two) times daily. On abdominal rash only, Disp: 80 g, Rfl: 0 .  TRUETRACK TEST test strip, FINGERSTICK BLOOD SUGAR TEST ONCE DAILY., Disp: 100 each, Rfl: 0 .  atorvastatin (LIPITOR) 10 MG tablet, Take 1 tablet (10 mg total) by mouth once., Disp: 30 tablet, Rfl: 5  No Known Allergies  ROS  Ten systems reviewed and is negative except as mentioned in HPI   Objective  Vitals:   08/20/17 1031  BP: 130/76  Pulse: 63  Resp: 16  Temp: 98.3 F (36.8 C)  TempSrc: Oral  SpO2: 97%  Weight: 272 lb 12.8 oz (123.7 kg)  Height: 6\' 2"  (1.88 m)    Body mass index is 35.03 kg/m.    Physical Exam  Constitutional: Patient appears well-developed and well-nourished. Obese  No distress.  HEENT: head atraumatic, normocephalic, pupils equal and reactive to light,  neck supple, throat within normal limits Cardiovascular: Normal rate, regular rhythm and normal heart sounds.  No murmur heard. 1 plus  BLE edema. Pulmonary/Chest: Effort normal and breath sounds normal. No respiratory distress. Abdominal: Soft.  There is no tenderness. Skin: 2 inches long lesion with a small area of open wound and clear oozing on right  lower medical leg. He has surrounding erythema but no pain and very mild induration noticed Psychiatric exam: calm, cooperative, did not speak  Recent Results (from the past 2160 hour(s))  POCT HgB A1C     Status: Abnormal   Collection Time: 05/25/17 10:25 AM  Result Value Ref Range   Hemoglobin A1C 5.6   CBC with Differential/Platelet     Status: Abnormal   Collection Time: 06/05/17  9:25 AM  Result Value Ref Range   WBC 6.3 3.8 - 10.8 Thousand/uL   RBC 4.80 4.20 - 5.80 Million/uL   Hemoglobin 15.0 13.2 - 17.1 g/dL   HCT 43.3 38.5 - 50.0 %   MCV 90.2 80.0 -  100.0 fL   MCH 31.3 27.0 - 33.0 pg   MCHC 34.6 32.0 - 36.0 g/dL   RDW 12.7 11.0 - 15.0 %   Platelets 111 (L) 140 - 400 Thousand/uL   MPV 11.8 7.5 - 12.5 fL   Neutro Abs 4,316 1,500 - 7,800 cells/uL   Lymphs Abs 1,386 850 - 3,900 cells/uL   WBC mixed population 441 200 - 950 cells/uL   Eosinophils Absolute 139 15 - 500 cells/uL   Basophils Absolute 19 0 - 200 cells/uL   Neutrophils Relative % 68.5 %   Total Lymphocyte 22.0 %   Monocytes Relative 7.0 %   Eosinophils Relative 2.2 %   Basophils Relative 0.3 %   Smear Review      Comment: No platelet clumps seen. Review of peripheral smear confirms automated results.   COMPLETE METABOLIC PANEL WITH GFR     Status: Abnormal   Collection Time: 06/05/17  9:25 AM  Result Value Ref Range   Glucose, Bld 110 (H) 65 - 99 mg/dL    Comment: .            Fasting reference interval . For someone without known diabetes, a glucose value between 100 and 125 mg/dL is consistent with prediabetes and should be confirmed with a follow-up test. .    BUN 13 7 - 25 mg/dL   Creat 0.81 0.70 - 1.33 mg/dL    Comment: For patients >38 years of age, the reference limit for Creatinine is approximately 13% higher for people identified as African-American. .    GFR, Est Non African American 99 > OR = 60 mL/min/1.78m2   GFR, Est African American 115 > OR = 60 mL/min/1.73m2   BUN/Creatinine  Ratio NOT APPLICABLE 6 - 22 (calc)   Sodium 142 135 - 146 mmol/L   Potassium 4.2 3.5 - 5.3 mmol/L   Chloride 104 98 - 110 mmol/L   CO2 29 20 - 32 mmol/L   Calcium 8.7 8.6 - 10.3 mg/dL   Total Protein 6.3 6.1 - 8.1 g/dL   Albumin 4.1 3.6 - 5.1 g/dL   Globulin 2.2 1.9 - 3.7 g/dL (calc)   AG Ratio 1.9 1.0 - 2.5 (calc)   Total Bilirubin 0.5 0.2 - 1.2 mg/dL   Alkaline phosphatase (APISO) 94 40 - 115 U/L   AST 14 10 - 35 U/L   ALT 17 9 - 46 U/L  Lipid panel     Status: None   Collection Time: 06/05/17  9:25 AM  Result Value Ref Range   Cholesterol 128 <200 mg/dL   HDL 47 >40 mg/dL   Triglycerides 100 <150 mg/dL   LDL Cholesterol (Calc) 62 mg/dL (calc)    Comment: Reference range: <100 . Desirable range <100 mg/dL for primary prevention;   <70 mg/dL for patients with CHD or diabetic patients  with > or = 2 CHD risk factors. Marland Kitchen LDL-C is now calculated using the Martin-Hopkins  calculation, which is a validated novel method providing  better accuracy than the Friedewald equation in the  estimation of LDL-C.  Cresenciano Genre et al. Annamaria Helling. 1610;960(45): 2061-2068  (http://education.[employer].com/faq/FAQ164)    Total CHOL/HDL Ratio 2.7 <5.0 (calc)   Non-HDL Cholesterol (Calc) 81 <130 mg/dL (calc)    Comment: For patients with diabetes plus 1 major ASCVD risk  factor, treating to a non-HDL-C goal of <100 mg/dL  (LDL-C of <70 mg/dL) is considered a therapeutic  option.      Assessment & Plan  1. Varicose veins, lower  extremity, with inflammation, ulcerated, unspecified laterality ([place])  Keep nails short and smooth, keep follow up with Dr. Lucky Cowboy, change dressing and advised to use vaseline or neosporin to avoid the gauze getting stuck on the wound  2. Thrombocytopenia ([place])  On his last labs, monitor for now - stable since last year, but explained it increases bleeding

## 2017-08-25 ENCOUNTER — Other Ambulatory Visit (INDEPENDENT_AMBULATORY_CARE_PROVIDER_SITE_OTHER): Payer: Self-pay | Admitting: Vascular Surgery

## 2017-08-28 ENCOUNTER — Encounter (INDEPENDENT_AMBULATORY_CARE_PROVIDER_SITE_OTHER): Payer: Self-pay

## 2017-09-03 ENCOUNTER — Other Ambulatory Visit: Payer: Self-pay | Admitting: Family Medicine

## 2017-09-03 DIAGNOSIS — F3012 Manic episode without psychotic symptoms, moderate: Secondary | ICD-10-CM

## 2017-09-08 DIAGNOSIS — F71 Moderate intellectual disabilities: Secondary | ICD-10-CM | POA: Diagnosis not present

## 2017-09-15 ENCOUNTER — Ambulatory Visit (INDEPENDENT_AMBULATORY_CARE_PROVIDER_SITE_OTHER): Payer: Medicare Other | Admitting: Vascular Surgery

## 2017-09-15 ENCOUNTER — Encounter (INDEPENDENT_AMBULATORY_CARE_PROVIDER_SITE_OTHER): Payer: Self-pay | Admitting: Vascular Surgery

## 2017-09-15 VITALS — BP 121/73 | HR 48 | Resp 15 | Ht 74.0 in | Wt 273.0 lb

## 2017-09-15 DIAGNOSIS — I83209 Varicose veins of unspecified lower extremity with both ulcer of unspecified site and inflammation: Secondary | ICD-10-CM

## 2017-09-15 DIAGNOSIS — I83222 Varicose veins of left lower extremity with both ulcer of calf and inflammation: Secondary | ICD-10-CM

## 2017-09-15 DIAGNOSIS — L97909 Non-pressure chronic ulcer of unspecified part of unspecified lower leg with unspecified severity: Principal | ICD-10-CM

## 2017-09-15 DIAGNOSIS — I83229 Varicose veins of left lower extremity with both ulcer of unspecified site and inflammation: Secondary | ICD-10-CM | POA: Diagnosis not present

## 2017-09-15 DIAGNOSIS — L97929 Non-pressure chronic ulcer of unspecified part of left lower leg with unspecified severity: Secondary | ICD-10-CM | POA: Diagnosis not present

## 2017-09-15 NOTE — Progress Notes (Signed)
Varicose veins, lower extremity, with inflammation, ulcerated, unspecified laterality ([place])     The patient's left lower extremity was sterilely prepped and draped. The ultrasound machine was used to visualize the saphenous vein throughout its course. A segment in the mid calf was selected for access. The saphenous vein was accessed without difficulty using ultrasound guidance with a micro puncture needle. A micro puncture wire and sheath were then placed. A 0.018 wire was placed beyond the saphenofemoral junction through the sheath and the micro puncture sheath was removed. The 65 cm sheath was then placed over the wire and the wire and dilator were removed. The laser fiber was placed through the sheath and its tip was placed approximately 4-5 cm below the saphenofemoral junction. Tumescent anesthesia was then created with a dilute lidocaine solution. Laser energy was then delivered with constant withdrawal of the sheath and laser fiber. Approximately 1950 Joules of energy were delivered over a length of 47 cm using the 1470 Hz VenaCure machine at [employer]. Sterile dressings were placed. The patient tolerated the procedure well without complications. The SSV ablation is dictated below.   Varicose veins, lower extremity, with inflammation, ulcerated, unspecified laterality ([place])    The patient's left lower extremity was sterilely prepped and draped. The ultrasound machine was used to visualize the lesser saphenous vein throughout its course. A segment in the mid calf was selected for access. The lesser saphenous vein was accessed without difficulty using ultrasound guidance with a micro puncture needle. A 0.018 wire was placed beyond the small saphenous-popliteal junction. The 65cm sheath was placed over the wire and the wire and dilator were removed. The laser fiber was placed through the sheath and its tip was placed approximately 4 cm below the small saphenous-popliteal junction. Tumescent anesthesia was then  created with a dilute lidocaine solution. Laser energy was then delivered with constant withdrawal of the sheath and laser fiber. Approximately 744 Joules of energy were delivered over a length of 18 cm using the 1470 Hz Venocare machine at [employer]. Sterile dressings were placed. The patient tolerated the procedure well without complications.

## 2017-09-18 ENCOUNTER — Ambulatory Visit (INDEPENDENT_AMBULATORY_CARE_PROVIDER_SITE_OTHER): Payer: Medicare Other

## 2017-09-18 DIAGNOSIS — L97909 Non-pressure chronic ulcer of unspecified part of unspecified lower leg with unspecified severity: Secondary | ICD-10-CM | POA: Diagnosis not present

## 2017-09-18 DIAGNOSIS — I83222 Varicose veins of left lower extremity with both ulcer of calf and inflammation: Secondary | ICD-10-CM

## 2017-09-18 DIAGNOSIS — I83209 Varicose veins of unspecified lower extremity with both ulcer of unspecified site and inflammation: Secondary | ICD-10-CM

## 2017-09-18 DIAGNOSIS — I83213 Varicose veins of right lower extremity with both ulcer of ankle and inflammation: Secondary | ICD-10-CM

## 2017-09-25 ENCOUNTER — Ambulatory Visit: Payer: Medicare Other | Admitting: Family Medicine

## 2017-09-29 ENCOUNTER — Other Ambulatory Visit (INDEPENDENT_AMBULATORY_CARE_PROVIDER_SITE_OTHER): Payer: Self-pay | Admitting: Vascular Surgery

## 2017-09-29 ENCOUNTER — Encounter (INDEPENDENT_AMBULATORY_CARE_PROVIDER_SITE_OTHER): Payer: Self-pay

## 2017-10-01 ENCOUNTER — Other Ambulatory Visit: Payer: Self-pay | Admitting: Family Medicine

## 2017-10-01 DIAGNOSIS — J3089 Other allergic rhinitis: Secondary | ICD-10-CM

## 2017-10-01 NOTE — Telephone Encounter (Signed)
Refill request for general medication: Loratadine to [facility].  Last office visit: 08/20/2017  Follow up 03/26/2018

## 2017-10-02 ENCOUNTER — Encounter (INDEPENDENT_AMBULATORY_CARE_PROVIDER_SITE_OTHER): Payer: Self-pay

## 2017-10-02 ENCOUNTER — Telehealth: Payer: Self-pay | Admitting: Family Medicine

## 2017-10-02 DIAGNOSIS — R600 Localized edema: Secondary | ICD-10-CM

## 2017-10-02 DIAGNOSIS — R238 Other skin changes: Secondary | ICD-10-CM

## 2017-10-02 NOTE — Telephone Encounter (Signed)
Copied from [place] [id number]. Topic: Quick Communication - See Telephone Encounter >> Oct 02, 2017  2:22 PM Boyd Kerbs wrote: CRM for notification. See Telephone encounter for: right leg  Dr. Ancil Boozer referred to vascular dr.  (they missed the appt., and office would not reschedule  )  Asking id Dr. Ancil Boozer would please send another referral to the same place for his right leg  10/02/17.

## 2017-10-05 NOTE — Telephone Encounter (Signed)
Wayne Jones is on staff at the group home and [facility] would not reschedule the appt because they do not have down pt is not his own guardian. that is why they need another referral.  Darlene states somehow this appt got cancelled and they have no idea why or how.  Pt saw Dr Lucky Cowboy last time at that office

## 2017-10-06 ENCOUNTER — Telehealth (INDEPENDENT_AMBULATORY_CARE_PROVIDER_SITE_OTHER): Payer: Self-pay | Admitting: Vascular Surgery

## 2017-10-06 NOTE — Telephone Encounter (Signed)
I contacted this patient's guardian to see if they have any papers stating that they have legal guardianship. I spoke with Carlyon Shadow and she stated that she did. I asked her to bring [place] a copy and then we will add them in on his record so that they will be able to make appointment for him and she said that she will.

## 2017-10-19 ENCOUNTER — Ambulatory Visit (INDEPENDENT_AMBULATORY_CARE_PROVIDER_SITE_OTHER): Payer: Medicare Other | Admitting: Podiatry

## 2017-10-19 ENCOUNTER — Encounter: Payer: Self-pay | Admitting: Podiatry

## 2017-10-19 DIAGNOSIS — M79675 Pain in left toe(s): Secondary | ICD-10-CM | POA: Diagnosis not present

## 2017-10-19 DIAGNOSIS — M79674 Pain in right toe(s): Secondary | ICD-10-CM | POA: Diagnosis not present

## 2017-10-19 DIAGNOSIS — E119 Type 2 diabetes mellitus without complications: Secondary | ICD-10-CM

## 2017-10-19 DIAGNOSIS — B351 Tinea unguium: Secondary | ICD-10-CM | POA: Diagnosis not present

## 2017-10-19 NOTE — Progress Notes (Signed)
Complaint:  Visit Type: Patient returns to my office for continued preventative foot care services. Complaint: Patient states" my nails have grown long and thick and become painful to walk and wear shoes"  The patient presents for preventative foot care services. No changes to ROS.  Patient is diabetic with no foot complications. Patient is diabetic.    Podiatric Exam: Vascular: dorsalis pedis and posterior tibial pulses are palpable bilateral. Capillary return is immediate. Temperature gradient is WNL. Skin turgor WNL  Sensorium: Normal Semmes Weinstein monofilament test. Normal tactile sensation bilaterally. Nail Exam: Pt has thick disfigured discolored nails with subungual debris noted bilateral entire nail second  through fifth toenails.  Previous nail surgery both hallux nails. Ulcer Exam: There is no evidence of ulcer or pre-ulcerative changes or infection. Orthopedic Exam: Muscle tone and strength are WNL. No limitations in general ROM. No crepitus or effusions noted. Foot type and digits show no abnormalities. Bony prominences are unremarkable. Skin: No Porokeratosis. No infection or ulcers  Diagnosis:  Onychomycosis, , Pain in right toe, pain in left toes  Treatment & Plan Procedures and Treatment: Consent by patient was obtained for treatment procedures. The patient understood the discussion of treatment and procedures well. All questions were answered thoroughly reviewed. Debridement of mycotic and hypertrophic toenails, 1 through 5 bilateral and clearing of subungual debris. No ulceration, no infection noted.  Return Visit-Office Procedure: Patient instructed to return to the office for a follow up visit 3 months for continued evaluation and treatment.    Gardiner Barefoot DPM

## 2017-10-23 ENCOUNTER — Other Ambulatory Visit: Payer: Self-pay

## 2017-10-23 MED ORDER — CARBAMIDE PEROXIDE 6.5 % OT SOLN: OTIC | 10 refills | Status: DC

## 2017-10-23 NOTE — Telephone Encounter (Signed)
Refill request for general medication. Oxfloxacin to [facility].   Last office visit 08/20/2017   Follow up on 03/26/2018

## 2017-10-29 ENCOUNTER — Other Ambulatory Visit: Payer: Self-pay | Admitting: Family Medicine

## 2017-10-29 DIAGNOSIS — F3012 Manic episode without psychotic symptoms, moderate: Secondary | ICD-10-CM

## 2017-10-30 NOTE — Telephone Encounter (Signed)
Refill request for general medication: Prozac 20 mg  Last office visit: 08/20/2017  Last physical exam: 03/24/2017  Follow-ups on file. 03/26/2018

## 2017-12-02 ENCOUNTER — Other Ambulatory Visit: Payer: Self-pay | Admitting: Family Medicine

## 2017-12-02 DIAGNOSIS — R809 Proteinuria, unspecified: Secondary | ICD-10-CM

## 2017-12-02 DIAGNOSIS — E782 Mixed hyperlipidemia: Secondary | ICD-10-CM

## 2017-12-02 DIAGNOSIS — E1129 Type 2 diabetes mellitus with other diabetic kidney complication: Secondary | ICD-10-CM

## 2017-12-02 DIAGNOSIS — F3012 Manic episode without psychotic symptoms, moderate: Secondary | ICD-10-CM

## 2017-12-02 DIAGNOSIS — K219 Gastro-esophageal reflux disease without esophagitis: Secondary | ICD-10-CM

## 2018-01-21 ENCOUNTER — Encounter: Payer: Self-pay | Admitting: Podiatry

## 2018-01-21 ENCOUNTER — Ambulatory Visit (INDEPENDENT_AMBULATORY_CARE_PROVIDER_SITE_OTHER): Payer: Medicare Other | Admitting: Podiatry

## 2018-01-21 DIAGNOSIS — M79674 Pain in right toe(s): Secondary | ICD-10-CM

## 2018-01-21 DIAGNOSIS — M79675 Pain in left toe(s): Secondary | ICD-10-CM | POA: Diagnosis not present

## 2018-01-21 DIAGNOSIS — B351 Tinea unguium: Secondary | ICD-10-CM

## 2018-01-21 DIAGNOSIS — E119 Type 2 diabetes mellitus without complications: Secondary | ICD-10-CM

## 2018-01-21 LAB — HM DIABETES FOOT EXAM

## 2018-01-21 NOTE — Progress Notes (Signed)
Complaint:  Visit Type: Patient returns to my office for continued preventative foot care services. Complaint: Patient states" my nails have grown long and thick and become painful to walk and wear shoes"  The patient presents for preventative foot care services. No changes to ROS.  Patient is diabetic with no foot complications. Patient is diabetic.    Podiatric Exam: Vascular: dorsalis pedis and posterior tibial pulses are palpable bilateral. Capillary return is immediate. Temperature gradient is WNL. Skin turgor WNL  Sensorium: Normal Semmes Weinstein monofilament test. Normal tactile sensation bilaterally. Nail Exam: Pt has thick disfigured discolored nails with subungual debris noted bilateral entire nail second  through fifth toenails.  Previous nail surgery both hallux nails. Ulcer Exam: There is no evidence of ulcer or pre-ulcerative changes or infection. Orthopedic Exam: Muscle tone and strength are WNL. No limitations in general ROM. No crepitus or effusions noted. Foot type and digits show no abnormalities. Bony prominences are unremarkable. Skin: No Porokeratosis. No infection or ulcers  Diagnosis:  Onychomycosis, , Pain in right toe, pain in left toes  Treatment & Plan Procedures and Treatment: Consent by patient was obtained for treatment procedures. The patient understood the discussion of treatment and procedures well. All questions were answered thoroughly reviewed. Debridement of mycotic and hypertrophic toenails, 1 through 5 bilateral and clearing of subungual debris. No ulceration, no infection noted.  Return Visit-Office Procedure: Patient instructed to return to the office for a follow up visit 4 months for continued evaluation and treatment.    Arby Dahir DPM 

## 2018-02-22 ENCOUNTER — Other Ambulatory Visit: Payer: Self-pay | Admitting: Family Medicine

## 2018-02-22 NOTE — Telephone Encounter (Signed)
Refill request was sent to Dr. Krichna Sowles for approval and submission.  

## 2018-03-02 ENCOUNTER — Telehealth: Payer: Self-pay

## 2018-03-02 NOTE — Telephone Encounter (Signed)
Called pt to sched AWV w/ [facility]. LVM requesting returned call.  

## 2018-03-03 ENCOUNTER — Other Ambulatory Visit: Payer: Self-pay | Admitting: Family Medicine

## 2018-03-03 DIAGNOSIS — K219 Gastro-esophageal reflux disease without esophagitis: Secondary | ICD-10-CM

## 2018-03-03 DIAGNOSIS — E782 Mixed hyperlipidemia: Secondary | ICD-10-CM

## 2018-03-03 DIAGNOSIS — R809 Proteinuria, unspecified: Secondary | ICD-10-CM

## 2018-03-03 DIAGNOSIS — E1129 Type 2 diabetes mellitus with other diabetic kidney complication: Secondary | ICD-10-CM

## 2018-03-03 DIAGNOSIS — F3012 Manic episode without psychotic symptoms, moderate: Secondary | ICD-10-CM

## 2018-03-04 ENCOUNTER — Telehealth: Payer: Self-pay

## 2018-03-04 NOTE — Telephone Encounter (Signed)
Overdue for AWV. Called pt to sched appt w/ [facility]. LVM requesting returned call. 

## 2018-03-09 DIAGNOSIS — F319 Bipolar disorder, unspecified: Secondary | ICD-10-CM | POA: Diagnosis not present

## 2018-03-16 ENCOUNTER — Ambulatory Visit (INDEPENDENT_AMBULATORY_CARE_PROVIDER_SITE_OTHER): Payer: Medicare Other | Admitting: Family Medicine

## 2018-03-16 ENCOUNTER — Encounter: Payer: Self-pay | Admitting: Family Medicine

## 2018-03-16 VITALS — BP 118/72 | HR 60 | Temp 98.2°F | Resp 18 | Ht 74.0 in | Wt 262.5 lb

## 2018-03-16 DIAGNOSIS — N5312 Painful ejaculation: Secondary | ICD-10-CM

## 2018-03-16 DIAGNOSIS — R300 Dysuria: Secondary | ICD-10-CM

## 2018-03-16 DIAGNOSIS — N342 Other urethritis: Secondary | ICD-10-CM | POA: Diagnosis not present

## 2018-03-16 LAB — POCT URINALYSIS DIPSTICK
BILIRUBIN UA: NEGATIVE
Blood, UA: NEGATIVE
Glucose, UA: NEGATIVE
KETONES UA: NEGATIVE
Leukocytes, UA: NEGATIVE
NITRITE UA: NEGATIVE
PH UA: 6 (ref 5.0–8.0)
PROTEIN UA: NEGATIVE
SPEC GRAV UA: 1.015 (ref 1.010–1.025)
UROBILINOGEN UA: NEGATIVE U/dL — AB

## 2018-03-16 MED ORDER — SULFAMETHOXAZOLE-TRIMETHOPRIM 800-160 MG PO TABS: 1.0000 | ORAL_TABLET | Freq: Two times a day (BID) | ORAL | 0 refills | Status: AC

## 2018-03-16 NOTE — Progress Notes (Signed)
Name: Wayne Jones   MRN: [id number]    DOB: 1961/04/26   Date:03/16/2018       Progress Note  Subjective  Chief Complaint  Chief Complaint  Patient presents with  . Urinary Tract Infection    pain when urinating    HPI  Pt presents with Wayne Jones Wayne Jones).  Pt reports penile pain for about a week, worse with ejaculation and urination.  Pt lives in a group home, and when asked if he has been sexually active, he does states that he has been, however caregiver states this is extremely unlikely. Endorses masturbating daily.  Denies fevers/chills, abdominal pain, back/flank pain.  UA is clear in office today - will send to culture, gonorrhea/chlamydia testing.   Patient Active Problem List   Diagnosis Date Noted  . Varicose veins, lower extremity, with inflammation, ulcerated, unspecified laterality ([place]) 05/26/2017  . Bilateral lower extremity edema 04/14/2017  . MR (mental retardation) 03/17/2016  . Polypharmacy 01/10/2016  . Benign prostatic hypertrophy without urinary obstruction 01/09/2015  . Chronic kidney disease (CKD), stage I 01/09/2015  . Cognitive decline 01/09/2015  . Controlled type 2 diabetes mellitus with microalbuminuria ([place]) 01/09/2015  . Acid reflux 01/09/2015  . Hearing loss 01/09/2015  . HLD (hyperlipidemia) 01/09/2015  . Extreme obesity 01/09/2015  . Obstructive sleep apnea 01/09/2015  . Other specified causes of urethral stricture 01/09/2015  . Hernia of anterior abdominal wall 01/09/2015  . Incomplete bladder emptying 07/09/2012  . 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 Benign essential HTN 02/04/2007  . Bipolar I disorder, single manic episode, moderate ([place]) 02/04/2007    Social History   Tobacco Use  . Smoking status: Never Smoker  . Smokeless tobacco: Never Used  Substance Use Topics  . Alcohol use: No    Alcohol/week: 0.0 standard drinks     Current Outpatient Medications:  .  Alcohol Swabs (B-D SINGLE USE SWABS REGULAR) PADS,  FINGERSTICK BLOOD SUGAR TEST ONCE DAILY INTHE MORNING PRIOR TO BREAKFAST. GLUCOSE GOAL 90-140 FASTING. IF>400 CALL MD, Disp: 100 each, Rfl: PRN .  ARIPiprazole (ABILIFY) 20 MG tablet, Take 20 mg by mouth daily., Disp: , Rfl:  .  aspirin 81 MG tablet, Take 1 tablet (81 mg total) by mouth daily. Enteric coated, Disp: 30 tablet, Rfl: 12 .  Aspirin-Calcium Carbonate (BAYER WOMENS) 81-777 MG TABS, Take by mouth., Disp: , Rfl:  .  atorvastatin (LIPITOR) 10 MG tablet, TAKE ONE TABLET BY MOUTH EACH DAY. (IMPROVES CHOLESTEROL), Disp: 30 tablet, Rfl: 5 .  carbamazepine (CARBATROL) 200 MG 12 hr capsule, Take by mouth., Disp: , Rfl:  .  carbamazepine (EQUETRO) 200 MG CP12 12 hr capsule, Take 1 capsule (200 mg total) by mouth 2 (two) times daily., Disp: 180 each, Rfl: 3 .  carbamide peroxide (EAR DROPS) 6.5 % OTIC solution, PLACE 5 DROPS INTO BOTH EARS TWO TIMES A DAY, Disp: 15 mL, Rfl: 10 .  chlorhexidine (PERIDEX) 0.12 % solution, Use as directed 15 mLs in the mouth or throat 2 (two) times daily., Disp: , Rfl:  .  clonazePAM (KLONOPIN) 1 MG tablet, Take 1 mg by mouth daily., Disp: , Rfl:  .  diphenhydrAMINE (BENADRYL) 25 MG tablet, Take by mouth., Disp: , Rfl:  .  diphenhydrAMINE (SOMINEX) 25 MG tablet, Take 25 mg by mouth at bedtime as needed for sleep., Disp: , Rfl:  .  Elastic Bandages & Supports (MEDICAL COMPRESSION SOCKS) MISC, 2 each by Does not apply route daily. Apply in am's and remove it at  bedtime, Disp: 2 each, Rfl: 1 .  finasteride (PROSCAR) 5 MG tablet, Take 5 mg by mouth daily., Disp: , Rfl:  .  FLUoxetine (PROZAC) 20 MG capsule, TAKE 1 CAPSULE BY MOUTH DAILY, Disp: 37 capsule, Rfl: 5 .  gabapentin (NEURONTIN) 600 MG tablet, Take 600 mg by mouth at bedtime., Disp: , Rfl:  .  guaiFENesin-dextromethorphan (ROBITUSSIN DM) 100-10 MG/5ML syrup, Take 5 mLs by mouth every 4 (four) hours as needed for cough., Disp: , Rfl:  .  ibuprofen (ADVIL,MOTRIN) 800 MG tablet, Take 800 mg by mouth every 8 (eight)  hours as needed., Disp: , Rfl:  .  lisinopril (PRINIVIL,ZESTRIL) 20 MG tablet, TAKE ONE TABLET BY MOUTH EVERY DAY, Disp: 37 tablet, Rfl: 5 .  loratadine (CLARITIN) 10 MG tablet, TAKE (1) TABLET BY MOUTH DAILY FOR ALLERGY., Disp: 30 tablet, Rfl: 0 .  nystatin (MYCOSTATIN/NYSTOP) powder, APPLY 3 TIMES A DAY, Disp: 30 g, Rfl: 2 .  omega-3 acid ethyl esters (LOVAZA) 1 g capsule, Take 1 capsule (1 g total) by mouth 2 (two) times daily., Disp: 180 capsule, Rfl: 5 .  omeprazole (PRILOSEC) 20 MG capsule, TAKE 1 CAPSULE BY MOUTH ONCE A DAY.. (G.E.R.D.) DO NOT CRUSH, Disp: 37 capsule, Rfl: 5 .  ondansetron (ZOFRAN) 4 MG tablet, Take by mouth., Disp: , Rfl:  .  Polyethyl Glyc-Propyl Glyc PF (SYSTANE ULTRA PF) 0.4-0.3 % SOLN, Apply to eye., Disp: , Rfl:  .  propranolol ER (INDERAL LA) 60 MG 24 hr capsule, Take 1 capsule (60 mg total) by mouth daily. (Patient taking differently: Take 60 mg by mouth 2 (two) times daily. ), Disp: 90 capsule, Rfl: 3 .  ranitidine (ZANTAC) 150 MG tablet, TAKE 1 TABLET BY MOUTH AT BEDTIME, Disp: 37 tablet, Rfl: 5 .  silver sulfADIAZINE (SILVADENE) 1 % cream, Apply 1 application topically daily., Disp: , Rfl:  .  tamsulosin (FLOMAX) 0.4 MG CAPS capsule, Take 0.4 mg by mouth daily after supper., Disp: , Rfl:  .  torsemide (DEMADEX) 20 MG tablet, Take by mouth., Disp: , Rfl:  .  traMADol (ULTRAM) 50 MG tablet, Take by mouth., Disp: , Rfl:  .  triamcinolone cream (KENALOG) 0.1 %, Apply 1 application topically 2 (two) times daily. On abdominal rash only, Disp: 80 g, Rfl: 0 .  TRUETRACK TEST test strip, FINGERSTICK BLOOD SUGAR TEST ONCE DAILY., Disp: 100 each, Rfl: 0  No Known Allergies  ROS  Ten systems reviewed and is negative except as mentioned in HPI.  Objective  Vitals:   03/16/18 1122  BP: 118/72  Pulse: 60  Resp: 18  Temp: 98.2 F (36.8 C)  TempSrc: Oral  SpO2: 97%  Weight: 262 lb 8 oz (119.1 kg)  Height: 6\' 2"  (1.88 m)   Body mass index is 33.7  kg/m.  Nursing Note and Vital Signs reviewed.  Physical Exam  Constitutional: Patient appears well-developed and well-nourished. No distress.  HENT: Head: Normocephalic and atraumatic. Neck: Normal range of motion. Neck supple. No JVD present. No thyromegaly present.  Cardiovascular: Normal rate, regular rhythm and normal heart sounds.  No murmur heard. No BLE edema. Pulmonary/Chest: Effort normal and breath sounds normal. No respiratory distress. Abdominal: Soft. Bowel sounds are normal, no distension. There is no tenderness. no masses MALE GENITALIA: Normal descended testes bilaterally, no masses palpated, no hernias, no lesions, no discharge. Normal cremasteric reflexes bilaterally.   Neurological: he is alert and oriented to person, place, and time. No gross cranial nerve deficit. Coordination, balance, strength, speech and gait  are baseline for patient.  Skin: Skin is warm and dry. No rash noted. No erythema.  Psychiatric: Patient has a normal mood and affect. behavior is baseline. Judgment and thought content baseline.  No results found for this or any previous visit (from the past 72 hour(s)).  Assessment & Plan  1. Dysuria - POCT urinalysis dipstick - Urine Culture - C. trachomatis/N. gonorrhoeae RNA - sulfamethoxazole-trimethoprim (BACTRIM DS,SEPTRA DS) 800-160 MG tablet; Take 1 tablet by mouth 2 (two) times daily for 5 days.  Dispense: 10 tablet; Refill: 0 - Advised to decrease masturbation to a few times a week to allow for decreased irritation and to avoid future infections.  2. Pain with ejaculation - C. trachomatis/N. gonorrhoeae RNA - Advised to decrease masturbation to a few times a week to allow for decreased irritation and to avoid future infections.  3. Urethritis - sulfamethoxazole-trimethoprim (BACTRIM DS,SEPTRA DS) 800-160 MG tablet; Take 1 tablet by mouth 2 (two) times daily for 5 days.  Dispense: 10 tablet; Refill: 0 - We will start treatment today based on  symptoms; advised to decrease masturbation to a few times a week to allow for decreased irritation and to avoid future infections.  -Red flags and when to present for emergency care or RTC including fever >101.36F, chest pain, shortness of breath, new/worsening/un-resolving symptoms, flank pain, abdominal pain, nausea, vomiting reviewed with patient at time of visit. Follow up and care instructions discussed and provided in AVS.

## 2018-03-17 LAB — C. TRACHOMATIS/N. GONORRHOEAE RNA
C. trachomatis RNA, TMA: NOT DETECTED
N. GONORRHOEAE RNA, TMA: NOT DETECTED

## 2018-03-17 LAB — URINE CULTURE
MICRO NUMBER:: 90960614
SPECIMEN QUALITY:: ADEQUATE

## 2018-03-25 ENCOUNTER — Ambulatory Visit: Payer: Medicare Other | Admitting: Family Medicine

## 2018-03-26 ENCOUNTER — Ambulatory Visit (INDEPENDENT_AMBULATORY_CARE_PROVIDER_SITE_OTHER): Payer: Medicare Other | Admitting: Family Medicine

## 2018-03-26 ENCOUNTER — Encounter: Payer: Self-pay | Admitting: Family Medicine

## 2018-03-26 VITALS — BP 114/76 | HR 56 | Temp 98.3°F | Resp 14 | Ht 74.0 in | Wt 260.3 lb

## 2018-03-26 DIAGNOSIS — E1129 Type 2 diabetes mellitus with other diabetic kidney complication: Secondary | ICD-10-CM

## 2018-03-26 DIAGNOSIS — D696 Thrombocytopenia, unspecified: Secondary | ICD-10-CM | POA: Diagnosis not present

## 2018-03-26 DIAGNOSIS — F3012 Manic episode without psychotic symptoms, moderate: Secondary | ICD-10-CM

## 2018-03-26 DIAGNOSIS — I10 Essential (primary) hypertension: Secondary | ICD-10-CM | POA: Diagnosis not present

## 2018-03-26 DIAGNOSIS — R809 Proteinuria, unspecified: Secondary | ICD-10-CM | POA: Diagnosis not present

## 2018-03-26 DIAGNOSIS — K219 Gastro-esophageal reflux disease without esophagitis: Secondary | ICD-10-CM | POA: Diagnosis not present

## 2018-03-26 DIAGNOSIS — N5312 Painful ejaculation: Secondary | ICD-10-CM

## 2018-03-26 DIAGNOSIS — R350 Frequency of micturition: Secondary | ICD-10-CM | POA: Diagnosis not present

## 2018-03-26 DIAGNOSIS — Z23 Encounter for immunization: Secondary | ICD-10-CM | POA: Diagnosis not present

## 2018-03-26 DIAGNOSIS — E782 Mixed hyperlipidemia: Secondary | ICD-10-CM | POA: Diagnosis not present

## 2018-03-26 DIAGNOSIS — Z1211 Encounter for screening for malignant neoplasm of colon: Secondary | ICD-10-CM

## 2018-03-26 MED ORDER — TAMSULOSIN HCL 0.4 MG PO CAPS: 0.4000 mg | ORAL_CAPSULE | Freq: Every day | ORAL | 5 refills | Status: DC

## 2018-03-26 NOTE — Progress Notes (Signed)
Name: Wayne Jones   MRN: [id number]    DOB: 03-11-1961   Date:03/26/2018       Progress Note  Subjective  Chief Complaint  Chief Complaint  Patient presents with  . 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 His hgbA1C is significantly better. Fasting glucose log was not brought to the clinic today. He is on statin therapy and aspirin. No symptoms of hypoglycemia. The caregiver states diet has improved, weight is a few pounds lighter. Explained he needs to be seen every 6 months , needs yearly eye exam, podiatrist saw him recently   Obesity: weight has been stable, We ordered a diabetic diet on his last visit. 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   BPH: he used to see  Dr. Jacqlyn Larsen, he still has nocturia, also seen last week by NP with complaints of pain during ejaculation and we will refer him to local provider since Dr. Jacqlyn Larsen no longer in [place]  Dyslipidemia: taking statin and Lovaza. We will recheck labs today   MR: lives in a group, under [facility] care, stable behavior, no wandering or aggression. He needs help taking medication and bathing, cannot self medicate.   Bipolar disorder: taking medication,  but behavior has been stable, does not seem to be depressed or manic. He sees psychiatrist, Dr. Tamera Punt  Edema Lower extremities: he has chronic lower extremity edema,seeing Dr. Lucky Cowboy now, doing better on compression stocking hoses.   GERD: well controlled, continue medication  OSA: he has been sleeping in the recliner, caregiver states he seems to stay up during the night  and sleep at the [facility]. He is off machine because of lack of compliance.   Patient Active Problem List   Diagnosis Date Noted  . Varicose veins, lower extremity, with inflammation, ulcerated, unspecified laterality ([place]) 05/26/2017  . Bilateral lower extremity edema 04/14/2017  . MR (mental retardation) 03/17/2016  . Polypharmacy 01/10/2016  . Benign prostatic hypertrophy without urinary obstruction 01/09/2015  . Chronic kidney disease (CKD), stage I 01/09/2015  . Cognitive decline 01/09/2015  . Controlled type 2 diabetes mellitus with microalbuminuria ([place]) 01/09/2015  . Acid reflux 01/09/2015  . Hearing loss 01/09/2015  . HLD (hyperlipidemia) 01/09/2015  . Extreme obesity 01/09/2015  . Obstructive sleep apnea 01/09/2015  . Other specified causes of urethral stricture 01/09/2015  . Hernia of anterior abdominal wall 01/09/2015  . Incomplete bladder emptying 07/09/2012  . 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 Benign essential HTN 02/04/2007  . Bipolar I disorder, single manic episode, moderate ([place]) 02/04/2007    Past Surgical History:  Procedure Laterality Date  . LAPAROSCOPIC CHOLECYSTECTOMY    . LAPAROSCOPY  12/03/2010  . VENTRAL HERNIA REPAIR      Family History  Family history unknown: Yes    Social History   Socioeconomic History  . Marital status: Single    Spouse name: Not on file  . Number of children: Not on file  . Years of education: Not on file  . Highest education level: Not on file  Occupational History  .  Not on file  Social Needs  . Financial resource strain: Not on file  . Food insecurity:    Worry: Not on file    Inability: Not on file  . Transportation needs:    Medical: Not on file    Non-medical: Not on file  Tobacco Use  . Smoking status: Never Smoker  . Smokeless tobacco: Never Used  Substance and Sexual Activity  . Alcohol use: No    Alcohol/week: 0.0 standard drinks  . Drug use: No  . Sexual activity: Never  Lifestyle  .  Physical activity:    Days per week: Not on file    Minutes per session: Not on file  . Stress: Not on file  Relationships  . Social connections:    Talks on phone: Not on file    Gets together: Not on file    Attends religious service: Not on file    Active member of club or organization: Not on file    Attends meetings of clubs or organizations: Not on file    Relationship status: Not on file  . Intimate partner violence:    Fear of current or ex partner: Not on file    Emotionally abused: Not on file    Physically abused: Not on file    Forced sexual activity: Not on file  Other Topics Concern  . Not on file  Social History Narrative  . Not on file     Current Outpatient Medications:  .  Alcohol Swabs (B-D SINGLE USE SWABS REGULAR) PADS, FINGERSTICK BLOOD SUGAR TEST ONCE DAILY INTHE MORNING PRIOR TO BREAKFAST. GLUCOSE GOAL 90-140 FASTING. IF>400 CALL MD, Disp: 100 each, Rfl: PRN .  ARIPiprazole (ABILIFY) 20 MG tablet, Take 20 mg by mouth daily., Disp: , Rfl:  .  aspirin 81 MG tablet, Take 1 tablet (81 mg total) by mouth daily. Enteric coated, Disp: 30 tablet, Rfl: 12 .  Aspirin-Calcium Carbonate (BAYER WOMENS) 81-777 MG TABS, Take by mouth., Disp: , Rfl:  .  atorvastatin (LIPITOR) 10 MG tablet, TAKE ONE TABLET BY MOUTH EACH DAY. (IMPROVES CHOLESTEROL), Disp: 30 tablet, Rfl: 5 .  carbamide peroxide (EAR DROPS) 6.5 % OTIC solution, PLACE 5 DROPS INTO BOTH EARS TWO TIMES A DAY, Disp: 15 mL, Rfl: 10 .  clonazePAM (KLONOPIN) 1 MG tablet, Take 1 mg by mouth daily., Disp: , Rfl:  .  diphenhydrAMINE (BENADRYL) 25 MG tablet, Take by mouth., Disp: , Rfl:  .  diphenhydrAMINE (SOMINEX) 25 MG tablet, Take 25 mg by mouth at bedtime as needed for sleep., Disp: , Rfl:  .  Elastic Bandages & Supports (MEDICAL COMPRESSION SOCKS) MISC, 2 each by Does not apply route daily. Apply in am's and remove it at bedtime, Disp: 2 each, Rfl: 1 .  finasteride (PROSCAR) 5 MG tablet, Take 5 mg by mouth daily.,  Disp: , Rfl:  .  FLUoxetine (PROZAC) 20 MG capsule, TAKE 1 CAPSULE BY MOUTH DAILY, Disp: 37 capsule, Rfl: 5 .  gabapentin (NEURONTIN) 600 MG tablet, Take 600 mg by mouth at bedtime., Disp: , Rfl:  .  guaiFENesin-dextromethorphan (ROBITUSSIN DM) 100-10 MG/5ML syrup, Take 5 mLs by mouth every 4 (four) hours as needed for cough., Disp: , Rfl:  .  ibuprofen (ADVIL,MOTRIN) 800 MG tablet, Take 800 mg by mouth every 8 (eight) hours as needed., Disp: , Rfl:  .  lisinopril (PRINIVIL,ZESTRIL) 20 MG tablet, TAKE ONE TABLET BY MOUTH EVERY DAY, Disp: 37 tablet, Rfl: 5 .  loratadine (CLARITIN) 10 MG tablet,  TAKE (1) TABLET BY MOUTH DAILY FOR ALLERGY., Disp: 30 tablet, Rfl: 0 .  nystatin (MYCOSTATIN/NYSTOP) powder, APPLY 3 TIMES A DAY, Disp: 30 g, Rfl: 2 .  omega-3 acid ethyl esters (LOVAZA) 1 g capsule, Take 1 capsule (1 g total) by mouth 2 (two) times daily., Disp: 180 capsule, Rfl: 5 .  omeprazole (PRILOSEC) 20 MG capsule, TAKE 1 CAPSULE BY MOUTH ONCE A DAY.. (G.E.R.D.) DO NOT CRUSH, Disp: 37 capsule, Rfl: 5 .  ondansetron (ZOFRAN) 4 MG tablet, Take by mouth., Disp: , Rfl:  .  Polyethyl Glyc-Propyl Glyc PF (SYSTANE ULTRA PF) 0.4-0.3 % SOLN, Apply to eye., Disp: , Rfl:  .  propranolol ER (INDERAL LA) 60 MG 24 hr capsule, Take 1 capsule (60 mg total) by mouth daily. (Patient taking differently: Take 60 mg by mouth 2 (two) times daily. ), Disp: 90 capsule, Rfl: 3 .  ranitidine (ZANTAC) 150 MG tablet, TAKE 1 TABLET BY MOUTH AT BEDTIME, Disp: 37 tablet, Rfl: 5 .  tamsulosin (FLOMAX) 0.4 MG CAPS capsule, Take 1 capsule (0.4 mg total) by mouth daily after supper., Disp: 30 capsule, Rfl: 5 .  triamcinolone cream (KENALOG) 0.1 %, Apply 1 application topically 2 (two) times daily. On abdominal rash only, Disp: 80 g, Rfl: 0 .  TRUETRACK TEST test strip, FINGERSTICK BLOOD SUGAR TEST ONCE DAILY., Disp: 100 each, Rfl: 0 .  carbamazepine (CARBATROL) 200 MG 12 hr capsule, Take by mouth., Disp: , Rfl:  .  carbamazepine  (EQUETRO) 200 MG CP12 12 hr capsule, Take 1 capsule (200 mg total) by mouth 2 (two) times daily., Disp: 180 each, Rfl: 3 .  chlorhexidine (PERIDEX) 0.12 % solution, Use as directed 15 mLs in the mouth or throat 2 (two) times daily., Disp: , Rfl:  .  torsemide (DEMADEX) 20 MG tablet, Take by mouth., Disp: , Rfl:  .  traMADol (ULTRAM) 50 MG tablet, Take by mouth., Disp: , Rfl:   No Known Allergies   ROS  Constitutional: Patient appears well-developed and well-nourished. Obese  No distress.  HEENT: head atraumatic, normocephalic, pupils equal and reactive to light,neck supple, throat within normal limits Cardiovascular: Normal rate, regular rhythm and normal heart sounds.  No murmur heard. No BLE edema. Wearing compression stocking hoses  Pulmonary/Chest: Effort normal and breath sounds normal. No respiratory distress. Abdominal: Soft.  There is no tenderness. Psychiatric: Patient has a normal mood and affect. behavior is normal. Judgment and thought content normal.   Objective  Vitals:   03/26/18 1011  BP: 114/76  Pulse: (!) 56  Resp: 14  Temp: 98.3 F (36.8 C)  TempSrc: Oral  SpO2: 97%  Weight: 260 lb 4.8 oz (118.1 kg)  Height: 6\' 2"  (1.88 m)    Body mass index is 33.42 kg/m.  Physical Exam  Constitutional: Patient appears well-developed and well-nourished. Obese  No distress.  HEENT: head atraumatic, normocephalic, pupils equal and reactive to light,neck supple, throat within normal limits Cardiovascular: Normal rate, regular rhythm and normal heart sounds.  No murmur heard. No BLE edema. Pulmonary/Chest: Effort normal and breath sounds normal. No respiratory distress. Abdominal: Soft.  There is no tenderness. Psychiatric: Patient has a normal mood and affect. behavior is normal. Cooperative  Recent Results (from the past 2160 hour(s))  POCT urinalysis dipstick     Status: Abnormal   Collection Time: 03/16/18 11:43 AM  Result Value Ref Range   Color, UA yellow     Clarity, UA clear    Glucose, UA Negative Negative   Bilirubin,  UA negative    Ketones, UA negaTive    Spec Grav, UA 1.015 1.010 - 1.025   Blood, UA negative    pH, UA 6.0 5.0 - 8.0   Protein, UA Negative Negative   Urobilinogen, UA negative (A) 0.2 or 1.0 E.U./dL   Nitrite, UA negative    Leukocytes, UA Negative Negative   Appearance clear    Odor none   Urine Culture     Status: Abnormal   Collection Time: 03/16/18  2:21 PM  Result Value Ref Range   MICRO NUMBER: [id number]    SPECIMEN QUALITY: ADEQUATE    Sample Source URINE    STATUS: FINAL    ISOLATE 1: Streptococcus, viridans group (A)     Comment: 50,000-100,000 CFU/mL of Streptococcus viridans group May represent colonizers from external and internal genitalia. No further testing (including susceptibility) will be performed.  C. trachomatis/N. gonorrhoeae RNA     Status: None   Collection Time: 03/16/18  2:21 PM  Result Value Ref Range   C. trachomatis RNA, TMA NOT DETECTED NOT DETECT   N. gonorrhoeae RNA, TMA NOT DETECTED NOT DETECT    Comment: This test was performed using the [place] ([place] . The analytical performance characteristics of this  assay, when used to test [employer] specimens have been determined by [employer]. .     PHQ2/9: Depression screen [facility] 2/9 03/16/2018 01/19/2017 03/14/2015  Decreased Interest 0 0 0  Down, Depressed, Hopeless 0 0 1  PHQ - 2 Score 0 0 1  Altered sleeping 0 - -  Tired, decreased energy 0 - -  Change in appetite 0 - -  Feeling bad or failure about yourself  0 - -  Trouble concentrating 0 - -  Moving slowly or fidgety/restless 0 - -  Suicidal thoughts 0 - -  PHQ-9 Score 0 - -     Fall Risk: Fall Risk  03/16/2018 05/25/2017 01/19/2017 07/15/2016 03/14/2015  Falls in the past year? No No No No No     Functional Status Survey: Is the patient deaf or have difficulty hearing?: No Does the patient have difficulty seeing, even when wearing  glasses/contacts?: No Does the patient have difficulty concentrating, remembering, or making decisions?: Yes Does the patient have difficulty walking or climbing stairs?: No Does the patient have difficulty dressing or bathing?: Yes Does the patient have difficulty doing errands alone such as visiting a doctor's office or shopping?: Yes   Assessment & Plan  1. Benign essential HTN  - COMPLETE METABOLIC PANEL WITH GFR  2. Pain with ejaculation  - Ambulatory referral to Urology  3. Thrombocytopenia (HCC)  - CBC with Differential/Platelet  4. Controlled type 2 diabetes mellitus with microalbuminuria, without long-term current use of insulin (HCC)  - Hemoglobin A1c  5. Mixed hyperlipidemia  - Lipid panel  6. Bipolar I disorder, single manic episode, moderate ([place])  Keep follow up with psychiatrist and current medication   7. Gastroesophageal reflux disease, esophagitis presence not specified  Under control   8. Morbid obesity, unspecified obesity type [facility])  Discussed with the patient the risk posed by an increased BMI. Discussed importance of portion control, calorie counting and at least 150 minutes of physical activity weekly. Avoid sweet beverages and drink more water. Eat at least 6 servings of fruit and vegetables daily   9. Needs flu shot  - Flu Vaccine QUAD 36+ mos IM  10. Colon cancer screening  - Cologuard  11. Urinary frequency  -  tamsulosin (FLOMAX) 0.4 MG CAPS capsule; Take 1 capsule (0.4 mg total) by mouth daily after supper.  Dispense: 30 capsule; Refill: 5

## 2018-03-27 LAB — CBC WITH DIFFERENTIAL/PLATELET
BASOS PCT: 0.5 %
Basophils Absolute: 31 cells/uL (ref 0–200)
EOS ABS: 122 {cells}/uL (ref 15–500)
Eosinophils Relative: 2 %
HEMATOCRIT: 42.3 % (ref 38.5–50.0)
Hemoglobin: 14.4 g/dL (ref 13.2–17.1)
Lymphs Abs: 1251 cells/uL (ref 850–3900)
MCH: 31.6 pg (ref 27.0–33.0)
MCHC: 34 g/dL (ref 32.0–36.0)
MCV: 92.8 fL (ref 80.0–100.0)
MONOS PCT: 8.2 %
MPV: 11.9 fL (ref 7.5–12.5)
NEUTROS PCT: 68.8 %
Neutro Abs: 4197 cells/uL (ref 1500–7800)
PLATELETS: 127 10*3/uL — AB (ref 140–400)
RBC: 4.56 10*6/uL (ref 4.20–5.80)
RDW: 12.8 % (ref 11.0–15.0)
TOTAL LYMPHOCYTE: 20.5 %
WBC: 6.1 10*3/uL (ref 3.8–10.8)
WBCMIX: 500 {cells}/uL (ref 200–950)

## 2018-03-27 LAB — COMPLETE METABOLIC PANEL WITH GFR
AG Ratio: 1.8 (calc) (ref 1.0–2.5)
ALT: 17 U/L (ref 9–46)
AST: 17 U/L (ref 10–35)
Albumin: 4.2 g/dL (ref 3.6–5.1)
Alkaline phosphatase (APISO): 89 U/L (ref 40–115)
BILIRUBIN TOTAL: 0.5 mg/dL (ref 0.2–1.2)
BUN: 14 mg/dL (ref 7–25)
CALCIUM: 8.9 mg/dL (ref 8.6–10.3)
CHLORIDE: 104 mmol/L (ref 98–110)
CO2: 27 mmol/L (ref 20–32)
Creat: 0.84 mg/dL (ref 0.70–1.33)
GFR, EST AFRICAN AMERICAN: 113 mL/min/{1.73_m2} (ref >=60)
GFR, EST NON AFRICAN AMERICAN: 97 mL/min/{1.73_m2} (ref >=60)
GLOBULIN: 2.3 g/dL (ref 1.9–3.7)
Glucose, Bld: 102 mg/dL (ref 65–139)
POTASSIUM: 4.3 mmol/L (ref 3.5–5.3)
Sodium: 141 mmol/L (ref 135–146)
Total Protein: 6.5 g/dL (ref 6.1–8.1)

## 2018-03-27 LAB — LIPID PANEL
Cholesterol: 123 mg/dL (ref <200)
HDL: 45 mg/dL (ref >40)
LDL Cholesterol (Calc): 61 mg/dL (calc)
NON-HDL CHOLESTEROL (CALC): 78 mg/dL (ref <130)
Total CHOL/HDL Ratio: 2.7 (calc) (ref <5.0)
Triglycerides: 91 mg/dL (ref <150)

## 2018-03-27 LAB — HEMOGLOBIN A1C
HEMOGLOBIN A1C: 5.3 %{Hb} (ref <5.7)
Mean Plasma Glucose: 105 (calc)
eAG (mmol/L): 5.8 (calc)

## 2018-04-01 ENCOUNTER — Other Ambulatory Visit: Payer: Self-pay

## 2018-04-01 DIAGNOSIS — R21 Rash and other nonspecific skin eruption: Secondary | ICD-10-CM

## 2018-04-01 NOTE — Telephone Encounter (Signed)
Refill request for general medication. Ibuprofen, Aspirin, Kenalog and Neosporin  Last office visit 03/26/2018   Follow up on 06/04/2018

## 2018-04-02 MED ORDER — IBUPROFEN 800 MG PO TABS: 800.0000 mg | ORAL_TABLET | Freq: Three times a day (TID) | ORAL | 0 refills | Status: DC | PRN

## 2018-04-02 MED ORDER — CLINDAMYCIN PHOSPHATE 1 % EX GEL: Freq: Two times a day (BID) | CUTANEOUS | 11 refills | Status: DC

## 2018-04-02 MED ORDER — POLYETHYL GLYC-PROPYL GLYC PF 0.4-0.3 % OP SOLN: 1.0000 | Freq: Every day | OPHTHALMIC | 11 refills | Status: DC

## 2018-04-02 MED ORDER — TRIAMCINOLONE ACETONIDE 0.1 % EX CREA: 1.0000 "application " | TOPICAL_CREAM | Freq: Two times a day (BID) | CUTANEOUS | 0 refills | Status: DC

## 2018-04-02 MED ORDER — ASSURE LANCE LANCETS MISC: 1.0000 | Freq: Every day | 4 refills | Status: DC

## 2018-04-02 MED ORDER — ASPIRIN 81 MG PO TABS: 81.0000 mg | ORAL_TABLET | Freq: Every day | ORAL | 12 refills | Status: DC

## 2018-04-02 MED ORDER — TRIPLE ANTIBIOTIC 5-400-5000 EX OINT: TOPICAL_OINTMENT | Freq: Four times a day (QID) | CUTANEOUS | 0 refills | Status: DC

## 2018-04-02 MED ORDER — NYSTATIN 100000 UNIT/GM EX POWD: CUTANEOUS | 2 refills | Status: DC

## 2018-04-09 DIAGNOSIS — E785 Hyperlipidemia, unspecified: Secondary | ICD-10-CM | POA: Diagnosis not present

## 2018-04-09 DIAGNOSIS — N401 Enlarged prostate with lower urinary tract symptoms: Secondary | ICD-10-CM | POA: Diagnosis not present

## 2018-04-09 DIAGNOSIS — E039 Hypothyroidism, unspecified: Secondary | ICD-10-CM | POA: Diagnosis not present

## 2018-04-09 DIAGNOSIS — Z9049 Acquired absence of other specified parts of digestive tract: Secondary | ICD-10-CM | POA: Diagnosis not present

## 2018-04-09 DIAGNOSIS — I10 Essential (primary) hypertension: Secondary | ICD-10-CM | POA: Diagnosis not present

## 2018-04-09 DIAGNOSIS — N35919 Unspecified urethral stricture, male, unspecified site: Secondary | ICD-10-CM | POA: Diagnosis not present

## 2018-04-22 ENCOUNTER — Telehealth: Payer: Self-pay

## 2018-04-22 DIAGNOSIS — Z1211 Encounter for screening for malignant neoplasm of colon: Secondary | ICD-10-CM

## 2018-04-22 NOTE — Telephone Encounter (Signed)
Trying to close up gaps on some of your patients. He needs a colonoscopy. I didn't see any reason he couldn't do a cologuard. If so can you please sign order and I will call him and let him know that we are sending package.

## 2018-04-27 ENCOUNTER — Ambulatory Visit: Payer: Medicare Other | Admitting: Family Medicine

## 2018-04-27 ENCOUNTER — Ambulatory Visit: Payer: Medicare Other | Admitting: Urology

## 2018-05-06 ENCOUNTER — Other Ambulatory Visit: Payer: Self-pay | Admitting: Family Medicine

## 2018-05-06 DIAGNOSIS — E782 Mixed hyperlipidemia: Secondary | ICD-10-CM

## 2018-05-06 DIAGNOSIS — R809 Proteinuria, unspecified: Secondary | ICD-10-CM

## 2018-05-06 DIAGNOSIS — J3089 Other allergic rhinitis: Secondary | ICD-10-CM

## 2018-05-06 DIAGNOSIS — E1129 Type 2 diabetes mellitus with other diabetic kidney complication: Secondary | ICD-10-CM

## 2018-05-06 DIAGNOSIS — K219 Gastro-esophageal reflux disease without esophagitis: Secondary | ICD-10-CM

## 2018-05-06 DIAGNOSIS — F3012 Manic episode without psychotic symptoms, moderate: Secondary | ICD-10-CM

## 2018-05-17 ENCOUNTER — Other Ambulatory Visit: Payer: Self-pay

## 2018-05-17 MED ORDER — GLUCOSE BLOOD VI STRP: 1.0000 | ORAL_STRIP | Freq: Every day | 1 refills | Status: DC

## 2018-05-17 NOTE — Telephone Encounter (Signed)
Refill request was sent to Dr. Krichna Sowles for approval and submission.  

## 2018-05-18 ENCOUNTER — Other Ambulatory Visit: Payer: Self-pay | Admitting: Family Medicine

## 2018-05-26 DIAGNOSIS — Z1211 Encounter for screening for malignant neoplasm of colon: Secondary | ICD-10-CM | POA: Diagnosis not present

## 2018-05-27 ENCOUNTER — Ambulatory Visit: Payer: Self-pay | Admitting: Podiatry

## 2018-05-31 ENCOUNTER — Ambulatory Visit: Payer: Self-pay | Admitting: Podiatry

## 2018-06-03 LAB — COLOGUARD: COLOGUARD: NEGATIVE

## 2018-06-04 ENCOUNTER — Ambulatory Visit (INDEPENDENT_AMBULATORY_CARE_PROVIDER_SITE_OTHER): Payer: Medicare Other | Admitting: Family Medicine

## 2018-06-04 ENCOUNTER — Encounter: Payer: Self-pay | Admitting: Family Medicine

## 2018-06-04 VITALS — BP 128/72 | HR 63 | Temp 97.9°F | Resp 18 | Ht 74.0 in | Wt 267.6 lb

## 2018-06-04 DIAGNOSIS — Z23 Encounter for immunization: Secondary | ICD-10-CM | POA: Diagnosis not present

## 2018-06-04 DIAGNOSIS — R809 Proteinuria, unspecified: Secondary | ICD-10-CM | POA: Diagnosis not present

## 2018-06-04 DIAGNOSIS — E1129 Type 2 diabetes mellitus with other diabetic kidney complication: Secondary | ICD-10-CM

## 2018-06-04 DIAGNOSIS — Z0000 Encounter for general adult medical examination without abnormal findings: Secondary | ICD-10-CM | POA: Diagnosis not present

## 2018-06-04 NOTE — Patient Instructions (Signed)

## 2018-06-04 NOTE — Progress Notes (Signed)
Patient: Wayne Jones, Male    DOB: [birth year], 57 y.o.   MRN: [id number]  Visit Date: 06/04/2018  Today's Provider: Hubbard Hartshorn, FNP   Chief Complaint  Patient presents with  . Annual Exam    annual wellness visit    Subjective:   Wayne Jones is a 57 y.o. male who presents today for his Subsequent Annual Wellness Visit.  He is accompanied by an appointment manager with [name].   Patient/Caregiver input: No concerns today  HPI  Past Medical History:  Diagnosis Date  . Depression   . Diabetes mellitus without complication ([place])   . Hyperlipidemia   . Hypertension   . Mental disorder     Past Surgical History:  Procedure Laterality Date  . LAPAROSCOPIC CHOLECYSTECTOMY    . LAPAROSCOPY  12/03/2010  . VENTRAL HERNIA REPAIR      Family History  Family history unknown: Yes    Social History   Socioeconomic History  . Marital status: Single    Spouse name: Not on file  . Number of children: Not on file  . Years of education: Not on file  . Highest education level: Not on file  Occupational History  . Occupation: disabled    Comment: Patient in care home  Social Needs  . Financial resource strain: Not hard at all  . Food insecurity:    Worry: Never true    Inability: Never true  . Transportation needs:    Medical: No    Non-medical: No  Tobacco Use  . Smoking status: Never Smoker  . Smokeless tobacco: Never Used  Substance and Sexual Activity  . Alcohol use: No    Alcohol/week: 0.0 standard drinks  . Drug use: No  . Sexual activity: Never  Lifestyle  . Physical activity:    Days per week: 0 days    Minutes per session: 0 min  . Stress: Not at all  Relationships  . Social connections:    Talks on phone: Not on file    Gets together: Not on file    Attends religious service: Not on file    Active member of club or organization: Not on file    Attends meetings of clubs or organizations: Not on file    Relationship status:  Not on file  . Intimate partner violence:    Fear of current or ex partner: Not on file    Emotionally abused: Not on file    Physically abused: Not on file    Forced sexual activity: Not on file  Other Topics Concern  . Not on file  Social History Narrative  . Not on file    Outpatient Encounter Medications as of 06/04/2018  Medication Sig  . Alcohol Swabs (B-D SINGLE USE SWABS REGULAR) PADS FINGERSTICK BLOOD SUGAR TEST ONCE DAILY IN THE MORNING PRIOR TO BREAKFAST. GLUCOSE GOAL 90-140 FASTING. IF>400 CALL MD  . ARIPiprazole (ABILIFY) 20 MG tablet Take 20 mg by mouth daily.  Marland Kitchen aspirin 81 MG tablet Take 1 tablet (81 mg total) by mouth daily. Enteric coated  . Aspirin-Calcium Carbonate (BAYER WOMENS) [phone] MG TABS Take by mouth.  Ellin Mayhew LANCETS MISC 1 each by Does not apply route daily. Fingerstick blood sugar test once daily in the morning prior to breakfast. Glucose Goal: 90-140 Fasting. If >400 call MD Controlled type 2 diabetes mellitus with microalbuminuria, without long-term current use of insulin (HCC)  Dx: E11.29  . atorvastatin (LIPITOR) 10 MG tablet  TAKE ONE TABLET BY MOUTH EACH DAY. (IMPROVES CHOLESTEROL)  . carbamazepine (EQUETRO) 200 MG CP12 12 hr capsule Take 1 capsule (200 mg total) by mouth 2 (two) times daily.  . carbamide peroxide (DEBROX) 6.5 % OTIC solution PLACE 5 DROPS INTO BOTH EARS TWO TIMES A DAY  . chlorhexidine (PERIDEX) 0.12 % solution Use as directed 15 mLs in the mouth or throat 2 (two) times daily.  . clindamycin (CLINDAGEL) 1 % gel Apply topically 2 (two) times daily.  . clonazePAM (KLONOPIN) 1 MG tablet Take 1 mg by mouth daily.  Water engineer Bandages & Supports (MEDICAL COMPRESSION SOCKS) MISC 2 each by Does not apply route daily. Apply in am's and remove it at bedtime  . finasteride (PROSCAR) 5 MG tablet Take 5 mg by mouth daily.  Marland Kitchen FLUoxetine (PROZAC) 20 MG capsule TAKE 1 CAPSULE BY MOUTH DAILY  . gabapentin (NEURONTIN) 600 MG tablet Take 600 mg by  mouth at bedtime.  Marland Kitchen glucose blood (TRUETRACK TEST) test strip 1 each by Other route daily. Use as instructed  . ibuprofen (ADVIL,MOTRIN) 800 MG tablet Take 1 tablet (800 mg total) by mouth every 8 (eight) hours as needed.  Marland Kitchen lisinopril (PRINIVIL,ZESTRIL) 20 MG tablet TAKE ONE TABLET BY MOUTH EVERY DAY  . loratadine (CLARITIN) 10 MG tablet TAKE (1) TABLET BY MOUTH DAILY FOR ALLERGY.  . neomycin-bacitracin-polymyxin (NEOSPORIN) [phone] ointment Apply topically 4 (four) times daily. Apply with dressing change daily  . nystatin (MYCOSTATIN/NYSTOP) powder APPLY 3 TIMES A DAY prn  . omega-3 acid ethyl esters (LOVAZA) 1 g capsule TAKE 1 CAPSULE BY MOUTH 2 TIMES DAILY  . omeprazole (PRILOSEC) 20 MG capsule TAKE 1 CAPSULE BY MOUTH ONCE A DAY.. (G.E.R.D.) DO NOT CRUSH  . ondansetron (ZOFRAN) 4 MG tablet Take by mouth.  Vladimir Faster Glyc-Propyl Glyc PF (SYSTANE ULTRA PF) 0.4-0.3 % SOLN Apply 1 each to eye daily.  . propranolol ER (INDERAL LA) 60 MG 24 hr capsule Take 1 capsule (60 mg total) by mouth daily. (Patient taking differently: Take 60 mg by mouth 2 (two) times daily. )  . ranitidine (ZANTAC) 150 MG tablet TAKE 1 TABLET BY MOUTH AT BEDTIME  . tamsulosin (FLOMAX) 0.4 MG CAPS capsule Take 1 capsule (0.4 mg total) by mouth daily after supper.  . torsemide (DEMADEX) 20 MG tablet Take by mouth.  . traMADol (ULTRAM) 50 MG tablet Take by mouth.  . triamcinolone cream (KENALOG) 0.1 % Apply 1 application topically 2 (two) times daily. On abdominal rash only  . [DISCONTINUED] carbamazepine (CARBATROL) 200 MG 12 hr capsule Take by mouth.  . diphenhydrAMINE (BENADRYL) 25 MG tablet Take by mouth.  . diphenhydrAMINE (SOMINEX) 25 MG tablet Take 25 mg by mouth at bedtime as needed for sleep.  Marland Kitchen guaiFENesin-dextromethorphan (ROBITUSSIN DM) 100-10 MG/5ML syrup Take 5 mLs by mouth every 4 (four) hours as needed for cough.   No facility-administered encounter medications on file as of 06/04/2018.     No Known  Allergies  Care Team Updated in EHR: Yes  Last Vision Exam: Unknown Wears corrective lenses: No Last Dental Exam: 10/05/17, has follow up this month Last Hearing Exam: Unknown Wears Hearing Aids: No  Functional Ability / Safety Screening 1.  Was the timed Get Up and Go test longer than 30 seconds?  no - ambulation is steady 2.  Does the patient need help with the phone, transportation, shopping,      preparing meals, housework, laundry, medications, or managing money?  yes 3.  Does the  patient's home have:  loose throw rugs in the hallway?   no      Grab bars in the bathroom? no      Handrails on the stairs?   yes      Poor lighting?   no 4.  Has the patient noticed any hearing difficulties?   no  Diet Recall and Exercise Regimen: He is involved in the special [employer] - has been doing bowling with them.  Eating a balanced diet at the group home, portions are monitored.  Fall Risk: No recent falls int he last year See screening under Objective Information  Depression Screen: Seeing psychiatry; See screening under Objective Information  Advanced Directives: A voluntary discussion about advance care planning including the explanation and discussion of advance directives was discussed with the patient. Explanation about the health care proxy and living will was reviewed.  During this discussion, the patient was able to identify a health care proxy as Cousin ([place]enn[place]) and plans/does not plan to fill out the paperwork required and will bring this to our office to keep on file. Does patient have a HCPOA?    No  If yes, name and contact information: Johny Sax phone number [phone] Does patient have a living will or MOST form?  no  Cancer Screenings: Lung: Low Dose CT Chest recommended if Age 39-80 years, 30 pack-year currently smoking OR have quit w/in 15years. Patient does not qualify.  Lifestyle risk factor issued reviewed: Diet, exercise, weight management,  advised patient smoking is not healthy, nutrition/diet.    Prostate: Does have BPH. PSA 04/09/18 was normal. No changes in urinary frequency.  He does see urology.  No pain with urination.  Pt is very poor historian, and is unable to complete AUA-7 questionnaire.  Colorectal: Cologuard Is UTD.  Additional Screenings:  Hepatitis B/HIV/Syphillis: Caregiver declines today Hepatitis C Screening: Completed in 2017 - negative Intimate Partner Violence: No concerns today AAA Screen: Men age 1 to 53 years if ever smoked recommended to get a one time AAA ultrasound screening exam. Patient does not qualify.  Objective:   Vitals: BP 128/72 (BP Location: Right Arm, Patient Position: Sitting, Cuff Size: Large)   Pulse 63   Temp 97.9 F (36.6 C) (Oral)   Resp 18   Ht 6\' 2"  (1.88 m)   Wt 267 lb 9.6 oz (121.4 kg)   SpO2 99%   BMI 34.36 kg/m  Body mass index is 34.36 kg/m.  Lab Results  Component Value Date   CHOL 123 03/26/2018   CHOL 128 06/05/2017   CHOL 134 07/18/2016   Lab Results  Component Value Date   HDL 45 03/26/2018   HDL 47 06/05/2017   HDL 43 07/18/2016   Lab Results  Component Value Date   LDLCALC 61 03/26/2018   LDLCALC 62 06/05/2017   LDLCALC 71 07/18/2016   Lab Results  Component Value Date   TRIG 91 03/26/2018   TRIG 100 06/05/2017   TRIG 102 07/18/2016   Lab Results  Component Value Date   CHOLHDL 2.7 03/26/2018   CHOLHDL 2.7 06/05/2017   CHOLHDL 3.1 07/18/2016   No results found for: LDLDIRECT  No exam data present  Cognitive Testing - 6-CIT  Correct? Score   What year is it? no 4 Yes = 0    No = 4  What month is it? no 4 Yes = 0    No = 3  Remember:     Pia Mau, [phone]  [place], [place]     What time is it? no 4 Yes = 0    No = 3  Count backwards from 20 to 1 no 4 Correct = 0    1 error = 2   More than 1 error = 4  Say the months of the year in reverse. no 4 Correct = 0    1 error = 2   More than 1 error = 4  What address did I ask you to  remember? no 10 Correct = 0  1 error = 2    2 error = 4    3 error = 6    4 error = 8    All wrong = 10       TOTAL SCORE  30/28   Interpretation:  Abnormal- patient has severe cognitive impairment at baseline and does not participate in this testing despite several attempts.  Normal (0-7) Abnormal (8-28)   Fall Risk: Fall Risk  06/04/2018 03/16/2018 05/25/2017 01/19/2017 07/15/2016  Falls in the past year? 0 No No No No  Number falls in past yr: 0 - - - -    Depression Screen Depression screen [facility] 2/9 06/04/2018 03/16/2018 01/19/2017 03/14/2015  Decreased Interest 0 0 0 0  Down, Depressed, Hopeless 0 0 0 1  PHQ - 2 Score 0 0 0 1  Altered sleeping 0 0 - -  Tired, decreased energy 0 0 - -  Change in appetite 0 0 - -  Feeling bad or failure about yourself  0 0 - -  Trouble concentrating 0 0 - -  Moving slowly or fidgety/restless 0 0 - -  Suicidal thoughts 0 0 - -  PHQ-9 Score 0 0 - -  Difficult doing work/chores Not difficult at all - - -    Recent Results (from the past 2160 hour(s))  POCT urinalysis dipstick     Status: Abnormal   Collection Time: 03/16/18 11:43 AM  Result Value Ref Range   Color, UA yellow    Clarity, UA clear    Glucose, UA Negative Negative   Bilirubin, UA negative    Ketones, UA negaTive    Spec Grav, UA 1.015 1.010 - 1.025   Blood, UA negative    pH, UA 6.0 5.0 - 8.0   Protein, UA Negative Negative   Urobilinogen, UA negative (A) 0.2 or 1.0 E.U./dL   Nitrite, UA negative    Leukocytes, UA Negative Negative   Appearance clear    Odor none   Urine Culture     Status: Abnormal   Collection Time: 03/16/18  2:21 PM  Result Value Ref Range   MICRO NUMBER: [id number]    SPECIMEN QUALITY: ADEQUATE    Sample Source URINE    STATUS: FINAL    ISOLATE 1: Streptococcus, viridans group (A)     Comment: 50,000-100,000 CFU/mL of Streptococcus viridans group May represent colonizers from external and internal genitalia. No further testing (including susceptibility)  will be performed.  C. trachomatis/N. gonorrhoeae RNA     Status: None   Collection Time: 03/16/18  2:21 PM  Result Value Ref Range   C. trachomatis RNA, TMA NOT DETECTED NOT DETECT   N. gonorrhoeae RNA, TMA NOT DETECTED NOT DETECT    Comment: This test was performed using the [place] ([place].). . The analytical performance characteristics of this  assay, when used to test [employer] specimens have been determined by [employer]. Marland Kitchen   CBC with  Differential/Platelet     Status: Abnormal   Collection Time: 03/26/18 11:06 AM  Result Value Ref Range   WBC 6.1 3.8 - 10.8 Thousand/uL   RBC 4.56 4.20 - 5.80 Million/uL   Hemoglobin 14.4 13.2 - 17.1 g/dL   HCT 42.3 38.5 - 50.0 %   MCV 92.8 80.0 - 100.0 fL   MCH 31.6 27.0 - 33.0 pg   MCHC 34.0 32.0 - 36.0 g/dL   RDW 12.8 11.0 - 15.0 %   Platelets 127 (L) 140 - 400 Thousand/uL   MPV 11.9 7.5 - 12.5 fL   Neutro Abs 4,197 1,500 - 7,800 cells/uL   Lymphs Abs 1,251 850 - 3,900 cells/uL   WBC mixed population 500 200 - 950 cells/uL   Eosinophils Absolute 122 15 - 500 cells/uL   Basophils Absolute 31 0 - 200 cells/uL   Neutrophils Relative % 68.8 %   Total Lymphocyte 20.5 %   Monocytes Relative 8.2 %   Eosinophils Relative 2.0 %   Basophils Relative 0.5 %   Smear Review      Comment: Review of peripheral smear confirms automated results.   COMPLETE METABOLIC PANEL WITH GFR     Status: None   Collection Time: 03/26/18 11:06 AM  Result Value Ref Range   Glucose, Bld 102 65 - 139 mg/dL    Comment: .        Non-fasting reference interval .    BUN 14 7 - 25 mg/dL   Creat 0.84 0.70 - 1.33 mg/dL    Comment: For patients >31 years of age, the reference limit for Creatinine is approximately 13% higher for people identified as African-American. .    GFR, Est Non African American 97 > OR = 60 mL/min/1.39m2   GFR, Est African American 113 > OR = 60 mL/min/1.24m2   BUN/Creatinine Ratio NOT APPLICABLE 6 - 22 (calc)    Sodium 141 135 - 146 mmol/L   Potassium 4.3 3.5 - 5.3 mmol/L   Chloride 104 98 - 110 mmol/L   CO2 27 20 - 32 mmol/L   Calcium 8.9 8.6 - 10.3 mg/dL   Total Protein 6.5 6.1 - 8.1 g/dL   Albumin 4.2 3.6 - 5.1 g/dL   Globulin 2.3 1.9 - 3.7 g/dL (calc)   AG Ratio 1.8 1.0 - 2.5 (calc)   Total Bilirubin 0.5 0.2 - 1.2 mg/dL   Alkaline phosphatase (APISO) 89 40 - 115 U/L   AST 17 10 - 35 U/L   ALT 17 9 - 46 U/L  Hemoglobin A1c     Status: None   Collection Time: 03/26/18 11:06 AM  Result Value Ref Range   Hgb A1c MFr Bld 5.3 <5.7 % of total Hgb    Comment: For the purpose of screening for the presence of diabetes: . <5.7%       Consistent with the absence of diabetes 5.7-6.4%    Consistent with increased risk for diabetes             (prediabetes) > or =6.5%  Consistent with diabetes . This assay result is consistent with a decreased risk of diabetes. . Currently, no consensus exists regarding use of hemoglobin A1c for diagnosis of diabetes in children. . According to American Diabetes Association (ADA) guidelines, hemoglobin A1c <7.0% represents optimal control in non-pregnant diabetic patients. Different metrics may apply to specific patient populations.  Standards of Medical Care in Diabetes(ADA). .    Mean Plasma Glucose 105 (calc)   eAG (mmol/L) 5.8 (calc)  Lipid panel     Status: None   Collection Time: 03/26/18 11:06 AM  Result Value Ref Range   Cholesterol 123 <200 mg/dL   HDL 45 >40 mg/dL   Triglycerides 91 <150 mg/dL   LDL Cholesterol (Calc) 61 mg/dL (calc)    Comment: Reference range: <100 . Desirable range <100 mg/dL for primary prevention;   <70 mg/dL for patients with CHD or diabetic patients  with > or = 2 CHD risk factors. Marland Kitchen LDL-C is now calculated using the Martin-Hopkins  calculation, which is a validated novel method providing  better accuracy than the Friedewald equation in the  estimation of LDL-C.  Cresenciano Genre et al. Annamaria Helling. 7672;094(70): 2061-2068   (http://education.[facility].com/faq/FAQ164)    Total CHOL/HDL Ratio 2.7 <5.0 (calc)   Non-HDL Cholesterol (Calc) 78 <130 mg/dL (calc)    Comment: For patients with diabetes plus 1 major ASCVD risk  factor, treating to a non-HDL-C goal of <100 mg/dL  (LDL-C of <70 mg/dL) is considered a therapeutic  option.   Cologuard     Status: None   Collection Time: 05/26/18 12:00 AM  Result Value Ref Range   Cologuard Negative      Assessment & Plan:    1. Medicare annual wellness visit, subsequent - See below  2. Need for influenza vaccination - Flu Vaccine QUAD 6+ mos PF IM (Fluarix Quad PF)  3. Controlled type 2 diabetes mellitus with microalbuminuria, without long-term current use of insulin ([place]) - Ambulatory referral to Ophthalmology  Exercise Activities and Dietary recommendations Goals    . DIET - EAT MORE FRUITS AND VEGETABLES    . Exercise 3x per week (30 min per time)      Discussed health benefits of physical activity, and encouraged him to engage in regular exercise appropriate for his age and condition.   Immunization History  Administered Date(s) Administered  . Influenza, Seasonal, Injecte, Preservative Fre 05/30/2010, 05/28/2011  . Influenza,inj,Quad PF,6+ Mos 04/13/2013, 04/24/2014, 03/14/2015, 06/13/2016, 05/20/2017, 03/26/2018, 06/04/2018  . Pneumococcal Conjugate-13 05/25/2017  . Pneumococcal Polysaccharide-23 03/31/2012, 03/17/2016  . Tdap 03/17/2016, 10/05/2016, 08/13/2017    Health Maintenance  Topic Date Due  . OPHTHALMOLOGY EXAM  12/31/2016  . HIV Screening  07/28/2019 (Originally 10/17/1975)  . HEMOGLOBIN A1C  09/26/2018  . FOOT EXAM  01/22/2019  . Fecal DNA (Cologuard)  05/26/2021  . TETANUS/TDAP  08/14/2027  . INFLUENZA VACCINE  Completed  . PNEUMOCOCCAL POLYSACCHARIDE VACCINE AGE 29-64 HIGH RISK  Completed  . Hepatitis C Screening  Completed     No orders of the defined types were placed in this encounter.   Current Outpatient  Medications:  .  Alcohol Swabs (B-D SINGLE USE SWABS REGULAR) PADS, FINGERSTICK BLOOD SUGAR TEST ONCE DAILY IN THE MORNING PRIOR TO BREAKFAST. GLUCOSE GOAL 90-140 FASTING. IF>400 CALL MD, Disp: 100 each, Rfl: PRN .  ARIPiprazole (ABILIFY) 20 MG tablet, Take 20 mg by mouth daily., Disp: , Rfl:  .  aspirin 81 MG tablet, Take 1 tablet (81 mg total) by mouth daily. Enteric coated, Disp: 30 tablet, Rfl: 12 .  Aspirin-Calcium Carbonate (BAYER WOMENS) 81-777 MG TABS, Take by mouth., Disp: , Rfl:  .  ASSURE LANCE LANCETS MISC, 1 each by Does not apply route daily. Fingerstick blood sugar test once daily in the morning prior to breakfast. Glucose Goal: 90-140 Fasting. If >400 call MD Controlled type 2 diabetes mellitus with microalbuminuria, without long-term current use of insulin (HCC)  Dx: E11.29, Disp: 200 each, Rfl: 4 .  atorvastatin (LIPITOR)  10 MG tablet, TAKE ONE TABLET BY MOUTH EACH DAY. (IMPROVES CHOLESTEROL), Disp: 28 tablet, Rfl: 0 .  carbamazepine (EQUETRO) 200 MG CP12 12 hr capsule, Take 1 capsule (200 mg total) by mouth 2 (two) times daily., Disp: 180 each, Rfl: 3 .  carbamide peroxide (DEBROX) 6.5 % OTIC solution, PLACE 5 DROPS INTO BOTH EARS TWO TIMES A DAY, Disp: 1 mL, Rfl: 0 .  chlorhexidine (PERIDEX) 0.12 % solution, Use as directed 15 mLs in the mouth or throat 2 (two) times daily., Disp: , Rfl:  .  clindamycin (CLINDAGEL) 1 % gel, Apply topically 2 (two) times daily., Disp: 30 g, Rfl: 11 .  clonazePAM (KLONOPIN) 1 MG tablet, Take 1 mg by mouth daily., Disp: , Rfl:  .  Elastic Bandages & Supports (MEDICAL COMPRESSION SOCKS) MISC, 2 each by Does not apply route daily. Apply in am's and remove it at bedtime, Disp: 2 each, Rfl: 1 .  finasteride (PROSCAR) 5 MG tablet, Take 5 mg by mouth daily., Disp: , Rfl:  .  FLUoxetine (PROZAC) 20 MG capsule, TAKE 1 CAPSULE BY MOUTH DAILY, Disp: 28 capsule, Rfl: 0 .  gabapentin (NEURONTIN) 600 MG tablet, Take 600 mg by mouth at bedtime., Disp: , Rfl:  .   glucose blood (TRUETRACK TEST) test strip, 1 each by Other route daily. Use as instructed, Disp: 100 each, Rfl: 1 .  ibuprofen (ADVIL,MOTRIN) 800 MG tablet, Take 1 tablet (800 mg total) by mouth every 8 (eight) hours as needed., Disp: 30 tablet, Rfl: 0 .  lisinopril (PRINIVIL,ZESTRIL) 20 MG tablet, TAKE ONE TABLET BY MOUTH EVERY DAY, Disp: 28 tablet, Rfl: 0 .  loratadine (CLARITIN) 10 MG tablet, TAKE (1) TABLET BY MOUTH DAILY FOR ALLERGY., Disp: 28 tablet, Rfl: 0 .  neomycin-bacitracin-polymyxin (NEOSPORIN) [phone] ointment, Apply topically 4 (four) times daily. Apply with dressing change daily, Disp: 28.3 g, Rfl: 0 .  nystatin (MYCOSTATIN/NYSTOP) powder, APPLY 3 TIMES A DAY prn, Disp: 30 g, Rfl: 2 .  omega-3 acid ethyl esters (LOVAZA) 1 g capsule, TAKE 1 CAPSULE BY MOUTH 2 TIMES DAILY, Disp: 56 capsule, Rfl: 5 .  omeprazole (PRILOSEC) 20 MG capsule, TAKE 1 CAPSULE BY MOUTH ONCE A DAY.. (G.E.R.D.) DO NOT CRUSH, Disp: 28 capsule, Rfl: 0 .  ondansetron (ZOFRAN) 4 MG tablet, Take by mouth., Disp: , Rfl:  .  Polyethyl Glyc-Propyl Glyc PF (SYSTANE ULTRA PF) 0.4-0.3 % SOLN, Apply 1 each to eye daily., Disp: 30 each, Rfl: 11 .  propranolol ER (INDERAL LA) 60 MG 24 hr capsule, Take 1 capsule (60 mg total) by mouth daily. (Patient taking differently: Take 60 mg by mouth 2 (two) times daily. ), Disp: 90 capsule, Rfl: 3 .  ranitidine (ZANTAC) 150 MG tablet, TAKE 1 TABLET BY MOUTH AT BEDTIME, Disp: 28 tablet, Rfl: 0 .  tamsulosin (FLOMAX) 0.4 MG CAPS capsule, Take 1 capsule (0.4 mg total) by mouth daily after supper., Disp: 30 capsule, Rfl: 5 .  torsemide (DEMADEX) 20 MG tablet, Take by mouth., Disp: , Rfl:  .  traMADol (ULTRAM) 50 MG tablet, Take by mouth., Disp: , Rfl:  .  triamcinolone cream (KENALOG) 0.1 %, Apply 1 application topically 2 (two) times daily. On abdominal rash only, Disp: 80 g, Rfl: 0 .  diphenhydrAMINE (BENADRYL) 25 MG tablet, Take by mouth., Disp: , Rfl:  .  diphenhydrAMINE (SOMINEX) 25  MG tablet, Take 25 mg by mouth at bedtime as needed for sleep., Disp: , Rfl:  .  guaiFENesin-dextromethorphan (ROBITUSSIN DM) 100-10 MG/5ML syrup,  Take 5 mLs by mouth every 4 (four) hours as needed for cough., Disp: , Rfl:  Medications Discontinued During This Encounter  Medication Reason  . carbamazepine (CARBATROL) 200 MG 12 hr capsule Duplicate    I have personally reviewed and addressed the Medicare Annual Wellness health risk assessment questionnaire and have noted the following in the patient's chart:  A.         Medical and social history & family history B.         Use of alcohol, tobacco or illicit drugs  C.         Current medications and supplements D.         Functional and Cognitive ability and status E.         Nutritional status F.         Physical activity G.        Advance directives H.         List of other physicians I.          Hospitalizations, surgeries, and ER visits in previous 12 months J.         [place] such as hearing and vision if needed, cognitive and depression L.         Referrals and appointments - Ophthalmology referral for diabetic eye exam  In addition, I have reviewed and discussed with patient certain preventive protocols, quality metrics, and best practice recommendations. A written personalized care plan for preventive services as well as general preventive health recommendations were provided to patient.  See attached scanned questionnaire for additional information.   Return in about 1 year (around 06/05/2019) for [employer].

## 2018-06-10 ENCOUNTER — Ambulatory Visit (INDEPENDENT_AMBULATORY_CARE_PROVIDER_SITE_OTHER): Payer: Medicare Other | Admitting: Podiatry

## 2018-06-10 ENCOUNTER — Encounter: Payer: Self-pay | Admitting: Podiatry

## 2018-06-10 DIAGNOSIS — B351 Tinea unguium: Secondary | ICD-10-CM

## 2018-06-10 DIAGNOSIS — M79675 Pain in left toe(s): Secondary | ICD-10-CM | POA: Diagnosis not present

## 2018-06-10 DIAGNOSIS — M79674 Pain in right toe(s): Secondary | ICD-10-CM

## 2018-06-10 DIAGNOSIS — E119 Type 2 diabetes mellitus without complications: Secondary | ICD-10-CM

## 2018-06-10 NOTE — Progress Notes (Signed)
Complaint:  Visit Type: Patient returns to my office for continued preventative foot care services. Complaint: Patient states" my nails have grown long and thick and become painful to walk and wear shoes"  The patient presents for preventative foot care services. No changes to ROS.  Patient is diabetic with no foot complications. Patient is diabetic.    Podiatric Exam: Vascular: dorsalis pedis and posterior tibial pulses are palpable bilateral. Capillary return is immediate. Temperature gradient is WNL. Skin turgor WNL  Sensorium: Normal Semmes Weinstein monofilament test. Normal tactile sensation bilaterally. Nail Exam: Pt has thick disfigured discolored nails with subungual debris noted bilateral entire nail second  through fifth toenails.  Previous nail surgery both hallux nails. Ulcer Exam: There is no evidence of ulcer or pre-ulcerative changes or infection. Orthopedic Exam: Muscle tone and strength are WNL. No limitations in general ROM. No crepitus or effusions noted. Foot type and digits show no abnormalities. Bony prominences are unremarkable. Skin: No Porokeratosis. No infection or ulcers  Diagnosis:  Onychomycosis, , Pain in right toe, pain in left toes  Treatment & Plan Procedures and Treatment: Consent by patient was obtained for treatment procedures. The patient understood the discussion of treatment and procedures well. All questions were answered thoroughly reviewed. Debridement of mycotic and hypertrophic toenails, 1 through 5 bilateral and clearing of subungual debris. No ulceration, no infection noted.  Return Visit-Office Procedure: Patient instructed to return to the office for a follow up visit 4 months for continued evaluation and treatment.    Gardiner Barefoot DPM

## 2018-06-26 ENCOUNTER — Other Ambulatory Visit: Payer: Self-pay | Admitting: Family Medicine

## 2018-06-26 DIAGNOSIS — R350 Frequency of micturition: Secondary | ICD-10-CM

## 2018-06-30 ENCOUNTER — Other Ambulatory Visit: Payer: Self-pay | Admitting: Family Medicine

## 2018-06-30 DIAGNOSIS — R809 Proteinuria, unspecified: Secondary | ICD-10-CM

## 2018-06-30 DIAGNOSIS — E782 Mixed hyperlipidemia: Secondary | ICD-10-CM

## 2018-06-30 DIAGNOSIS — E1129 Type 2 diabetes mellitus with other diabetic kidney complication: Secondary | ICD-10-CM

## 2018-06-30 DIAGNOSIS — F3012 Manic episode without psychotic symptoms, moderate: Secondary | ICD-10-CM

## 2018-06-30 DIAGNOSIS — K219 Gastro-esophageal reflux disease without esophagitis: Secondary | ICD-10-CM

## 2018-06-30 DIAGNOSIS — J3089 Other allergic rhinitis: Secondary | ICD-10-CM

## 2018-07-22 DIAGNOSIS — E119 Type 2 diabetes mellitus without complications: Secondary | ICD-10-CM | POA: Diagnosis not present

## 2018-07-22 LAB — HM DIABETES EYE EXAM

## 2018-09-07 DIAGNOSIS — F319 Bipolar disorder, unspecified: Secondary | ICD-10-CM | POA: Diagnosis not present

## 2018-09-27 ENCOUNTER — Encounter: Payer: Self-pay | Admitting: Family Medicine

## 2018-09-27 ENCOUNTER — Ambulatory Visit (INDEPENDENT_AMBULATORY_CARE_PROVIDER_SITE_OTHER): Payer: Medicare Other | Admitting: Family Medicine

## 2018-09-27 VITALS — BP 128/74 | HR 62 | Temp 97.5°F | Resp 16 | Ht 74.0 in | Wt 260.8 lb

## 2018-09-27 DIAGNOSIS — F3012 Manic episode without psychotic symptoms, moderate: Secondary | ICD-10-CM | POA: Diagnosis not present

## 2018-09-27 DIAGNOSIS — E1129 Type 2 diabetes mellitus with other diabetic kidney complication: Secondary | ICD-10-CM | POA: Diagnosis not present

## 2018-09-27 DIAGNOSIS — I10 Essential (primary) hypertension: Secondary | ICD-10-CM

## 2018-09-27 DIAGNOSIS — R809 Proteinuria, unspecified: Secondary | ICD-10-CM

## 2018-09-27 DIAGNOSIS — K219 Gastro-esophageal reflux disease without esophagitis: Secondary | ICD-10-CM | POA: Diagnosis not present

## 2018-09-27 DIAGNOSIS — E669 Obesity, unspecified: Secondary | ICD-10-CM | POA: Diagnosis not present

## 2018-09-27 DIAGNOSIS — D696 Thrombocytopenia, unspecified: Secondary | ICD-10-CM

## 2018-09-27 DIAGNOSIS — I83209 Varicose veins of unspecified lower extremity with both ulcer of unspecified site and inflammation: Secondary | ICD-10-CM | POA: Diagnosis not present

## 2018-09-27 DIAGNOSIS — L97909 Non-pressure chronic ulcer of unspecified part of unspecified lower leg with unspecified severity: Secondary | ICD-10-CM | POA: Diagnosis not present

## 2018-09-27 LAB — CBC WITH DIFFERENTIAL/PLATELET
ABSOLUTE MONOCYTES: 490 {cells}/uL (ref 200–950)
BASOS ABS: 30 {cells}/uL (ref 0–200)
Basophils Relative: 0.5 %
EOS PCT: 2.5 %
Eosinophils Absolute: 148 cells/uL (ref 15–500)
HEMATOCRIT: 40.8 % (ref 38.5–50.0)
Hemoglobin: 14.4 g/dL (ref 13.2–17.1)
LYMPHS ABS: 1215 {cells}/uL (ref 850–3900)
MCH: 32.4 pg (ref 27.0–33.0)
MCHC: 35.3 g/dL (ref 32.0–36.0)
MCV: 91.9 fL (ref 80.0–100.0)
MPV: 12.1 fL (ref 7.5–12.5)
Monocytes Relative: 8.3 %
NEUTROS PCT: 68.1 %
Neutro Abs: 4018 cells/uL (ref 1500–7800)
Platelets: 117 10*3/uL — ABNORMAL LOW (ref 140–400)
RBC: 4.44 10*6/uL (ref 4.20–5.80)
RDW: 12.7 % (ref 11.0–15.0)
Total Lymphocyte: 20.6 %
WBC: 5.9 10*3/uL (ref 3.8–10.8)

## 2018-09-27 LAB — POCT GLYCOSYLATED HEMOGLOBIN (HGB A1C): HBA1C, POC (CONTROLLED DIABETIC RANGE): 5.1 % (ref 0.0–7.0)

## 2018-09-27 NOTE — Progress Notes (Signed)
Name: Wayne Jones   MRN: [id number]    DOB: 10-31-1960   Date:09/27/2018       Progress Note  Subjective  Chief Complaint  Chief Complaint  Patient presents with  . Medication Refill    6 month F/U  . Hypertension  . Diabetes    Checks every morning-preprandial BS Average-120 Highest-189  . Dyslipidemia  . MR  . Manic Behavior  . Gastroesophageal Reflux  . Sleep Apnea    HPI  DMII with microalbuminuria: he is now on ace, no side effects. His hgbA1Cis normal and not on medication, just life style modification.  Fasting glucose log was not brought to the clinic today. He is on statin therapy and aspirin. No symptoms of hypoglycemia. Weight is stable based on weight from 6 months ago   Obesity: weight has been stable, We ordered a diabetic diet on his last visit.He has been exercising at the group home now   HTN: on beta-blocker and Ace, bp is towards low end of normal, no chest pain but has intermittent palpitation, no dizzinessoff diuretics. BP is at goal today   BPH: he used to see  Dr. Jacqlyn Larsen, he still has nocturia, also seen last week by NP with complaints of pain during ejaculation . Now under the care of Dr. Kirke Corin   Dyslipidemia: taking statin and Lovaza. Reviewed labs done in August and was at goal   MR: lives in a group, under [place] care, stable behavior, no wandering or aggression. He needs help taking medication and bathing, cannot self medicate. Stable   Bipolar disorder: taking medication,  but behavior has been stable, does not seem to be depressed or manic. He sees psychiatrist, Dr. Tamera Punt  Edema Lower extremities: he has chronic lower extremity edema,seeing Dr. Lucky Cowboy now, doing better on compression stocking hoses. Unchanged    Patient Active Problem List   Diagnosis Date Noted  . Varicose veins, lower extremity, with inflammation, ulcerated, unspecified laterality ([place]) 05/26/2017  . Bilateral lower extremity edema 04/14/2017  . MR  (mental retardation) 03/17/2016  . Polypharmacy 01/10/2016  . Benign prostatic hypertrophy without urinary obstruction 01/09/2015  . Chronic kidney disease (CKD), stage I 01/09/2015  . Cognitive decline 01/09/2015  . Controlled type 2 diabetes mellitus with microalbuminuria ([place]) 01/09/2015  . Acid reflux 01/09/2015  . Hearing loss 01/09/2015  . HLD (hyperlipidemia) 01/09/2015  . Extreme obesity 01/09/2015  . Obstructive sleep apnea 01/09/2015  . Other specified causes of urethral stricture 01/09/2015  . Hernia of anterior abdominal wall 01/09/2015  . Incomplete bladder emptying 07/09/2012  . 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 Benign essential HTN 02/04/2007  . Bipolar I disorder, single manic episode, moderate ([place]) 02/04/2007    Past Surgical History:  Procedure Laterality Date  . LAPAROSCOPIC CHOLECYSTECTOMY    . LAPAROSCOPY  12/03/2010  . VENTRAL HERNIA REPAIR      Family History  Family history unknown: Yes    Social History   Socioeconomic History  . Marital status: Single    Spouse name: Not on file  . Number of children: Not on file  . Years of education: Not on file  . Highest education level: Not on file  Occupational History  . Occupation: disabled    Comment: Patient in care home  Social Needs  . Financial resource strain: Not hard at all  . Food insecurity:    Worry: Never true    Inability: Never true  . Transportation needs:    Medical: No  Non-medical: No  Tobacco Use  . Smoking status: Never Smoker  . Smokeless tobacco: Never Used  Substance and Sexual Activity  . Alcohol use: No    Alcohol/week: 0.0 standard drinks  . Drug use: No  . Sexual activity: Never  Lifestyle  . Physical activity:    Days per week: 0 days    Minutes per session: 0 min  . Stress: Not at all  Relationships  . Social connections:    Talks on phone: Not on file    Gets together: Not on file    Attends religious service: Not on file    Active member of club or  organization: Not on file    Attends meetings of clubs or organizations: Not on file    Relationship status: Not on file  . Intimate partner violence:    Fear of current or ex partner: Not on file    Emotionally abused: Not on file    Physically abused: Not on file    Forced sexual activity: Not on file  Other Topics Concern  . Not on file  Social History Narrative  . Not on file     Current Outpatient Medications:  .  Alcohol Swabs (B-D SINGLE USE SWABS REGULAR) PADS, FINGERSTICK BLOOD SUGAR TEST ONCE DAILY IN THE MORNING PRIOR TO BREAKFAST. GLUCOSE GOAL 90-140 FASTING. IF>400 CALL MD, Disp: 100 each, Rfl: PRN .  ALPRAZolam (XANAX) 0.5 MG tablet, , Disp: , Rfl:  .  ARIPiprazole (ABILIFY) 20 MG tablet, Take 20 mg by mouth daily., Disp: , Rfl:  .  aspirin 81 MG tablet, Take 1 tablet (81 mg total) by mouth daily. Enteric coated, Disp: 30 tablet, Rfl: 12 .  Aspirin-Calcium Carbonate (BAYER WOMENS) 81-777 MG TABS, Take by mouth., Disp: , Rfl:  .  ASSURE LANCE LANCETS MISC, 1 each by Does not apply route daily. Fingerstick blood sugar test once daily in the morning prior to breakfast. Glucose Goal: 90-140 Fasting. If >400 call MD Controlled type 2 diabetes mellitus with microalbuminuria, without long-term current use of insulin (HCC)  Dx: E11.29, Disp: 200 each, Rfl: 4 .  atorvastatin (LIPITOR) 10 MG tablet, TAKE ONE TABLET BY MOUTH EACH DAY. (IMPROVES CHOLESTEROL), Disp: 30 tablet, Rfl: 10 .  Blood Glucose Monitoring Suppl (TRUE METRIX METER) w/Device KIT, by Does not apply route., Disp: , Rfl:  .  carbamazepine (EQUETRO) 200 MG CP12 12 hr capsule, Take 1 capsule (200 mg total) by mouth 2 (two) times daily., Disp: 180 each, Rfl: 3 .  carbamide peroxide (DEBROX) 6.5 % OTIC solution, PLACE 5 DROPS INTO BOTH EARS TWO TIMES A DAY, Disp: 1 mL, Rfl: 0 .  chlorhexidine (PERIDEX) 0.12 % solution, Use as directed 15 mLs in the mouth or throat 2 (two) times daily., Disp: , Rfl:  .  clindamycin  (CLINDAGEL) 1 % gel, Apply topically 2 (two) times daily., Disp: 30 g, Rfl: 11 .  clonazePAM (KLONOPIN) 1 MG tablet, Take 1 mg by mouth daily., Disp: , Rfl:  .  diphenhydrAMINE (BENADRYL) 25 MG tablet, Take by mouth., Disp: , Rfl:  .  diphenhydrAMINE (SOMINEX) 25 MG tablet, Take 25 mg by mouth at bedtime as needed for sleep., Disp: , Rfl:  .  Elastic Bandages & Supports (MEDICAL COMPRESSION SOCKS) MISC, 2 each by Does not apply route daily. Apply in am's and remove it at bedtime, Disp: 2 each, Rfl: 1 .  finasteride (PROSCAR) 5 MG tablet, Take 5 mg by mouth daily., Disp: , Rfl:  .  FLUoxetine (PROZAC) 20 MG capsule, TAKE 1 CAPSULE BY MOUTH DAILY, Disp: 30 capsule, Rfl: 10 .  gabapentin (NEURONTIN) 600 MG tablet, Take 600 mg by mouth at bedtime., Disp: , Rfl:  .  guaiFENesin-dextromethorphan (ROBITUSSIN DM) 100-10 MG/5ML syrup, Take 5 mLs by mouth every 4 (four) hours as needed for cough., Disp: , Rfl:  .  ibuprofen (ADVIL,MOTRIN) 800 MG tablet, Take 1 tablet (800 mg total) by mouth every 8 (eight) hours as needed., Disp: 30 tablet, Rfl: 0 .  lisinopril (PRINIVIL,ZESTRIL) 20 MG tablet, TAKE ONE TABLET BY MOUTH EVERY DAY, Disp: 30 tablet, Rfl: 10 .  loratadine (CLARITIN) 10 MG tablet, TAKE (1) TABLET BY MOUTH DAILY FOR ALLERGY., Disp: 30 tablet, Rfl: 10 .  neomycin-bacitracin-polymyxin (NEOSPORIN) [phone] ointment, Apply topically 4 (four) times daily. Apply with dressing change daily, Disp: 28.3 g, Rfl: 0 .  nystatin (MYCOSTATIN/NYSTOP) powder, APPLY 3 TIMES A DAY prn, Disp: 30 g, Rfl: 2 .  omega-3 acid ethyl esters (LOVAZA) 1 g capsule, TAKE 1 CAPSULE BY MOUTH 2 TIMES DAILY, Disp: 56 capsule, Rfl: 5 .  omeprazole (PRILOSEC) 20 MG capsule, TAKE 1 CAPSULE BY MOUTH ONCE A DAY.. (G.E.R.D.) DO NOT CRUSH, Disp: 30 capsule, Rfl: 10 .  ondansetron (ZOFRAN) 4 MG tablet, Take by mouth., Disp: , Rfl:  .  Polyethyl Glyc-Propyl Glyc PF (SYSTANE ULTRA PF) 0.4-0.3 % SOLN, Apply 1 each to eye daily., Disp: 30  each, Rfl: 11 .  propranolol ER (INDERAL LA) 60 MG 24 hr capsule, Take 1 capsule (60 mg total) by mouth daily. (Patient taking differently: Take 60 mg by mouth 2 (two) times daily. ), Disp: 90 capsule, Rfl: 3 .  ranitidine (ZANTAC) 150 MG tablet, TAKE 1 TABLET BY MOUTH AT BEDTIME, Disp: 30 tablet, Rfl: 10 .  tamsulosin (FLOMAX) 0.4 MG CAPS capsule, TAKE ONE CAPSULE BY MOUTH EVERY DAY AFTER SUPPER *DO NOT CRUSH*, Disp: 31 capsule, Rfl: 2 .  torsemide (DEMADEX) 20 MG tablet, Take by mouth., Disp: , Rfl:  .  traMADol (ULTRAM) 50 MG tablet, Take by mouth., Disp: , Rfl:  .  triamcinolone cream (KENALOG) 0.1 %, Apply 1 application topically 2 (two) times daily. On abdominal rash only, Disp: 80 g, Rfl: 0  No Known Allergies  I personally reviewed active problem list, medication list, allergies, family history, social history, health maintenance with the patient/caregiver today.   ROS  Constitutional: Negative for fever or weight change.  Respiratory: Negative for cough and shortness of breath.   Cardiovascular: Negative for chest pain or palpitations.  Gastrointestinal: Negative for abdominal pain, no bowel changes.  Musculoskeletal: Negative for gait problem or joint swelling.  Skin: Negative for rash.  Neurological: Negative for dizziness or headache.  No other specific complaints in a complete review of systems (except as listed in HPI above).  Objective  Vitals:   09/27/18 1008  BP: 128/74  Pulse: 62  Resp: 16  Temp: (!) 97.5 F (36.4 C)  TempSrc: Oral  SpO2: 99%  Weight: 260 lb 12.8 oz (118.3 kg)  Height: _0  (1.88 m)    Body mass index is 33.48 kg/m.  Physical Exam  Constitutional: Patient appears well-developed and well-nourished. Obese No distress.  HEENT: head atraumatic, normocephalic, pupils equal and reactive to light, ears cerumen bilaterally,  neck supple but has decrease rom ( poor extension)  throat within normal limits Cardiovascular: Normal rate, regular  rhythm and normal heart sounds.  No murmur heard. No BLE edema. Pulmonary/Chest: Effort normal and breath sounds normal.  No respiratory distress. Abdominal: Soft.  There is no tenderness. No hepatosplenomegaly, negative CVA tenderness Neurologist: normal reflexes and sensation  Psychiatric: Cooperative, flat affect   Recent Results (from the past 2160 hour(s))  HM DIABETES EYE EXAM     Status: None   Collection Time: 07/22/18 12:00 AM  Result Value Ref Range   HM Diabetic Eye Exam No Retinopathy No Retinopathy  POCT HgB A1C     Status: Normal   Collection Time: 09/27/18 10:27 AM  Result Value Ref Range   Hemoglobin A1C     HbA1c POC (<> result, manual entry)     HbA1c, POC (prediabetic range)     HbA1c, POC (controlled diabetic range) 5.1 0.0 - 7.0 %      PHQ2/9: Depression screen [facility] 2/9 09/27/2018 06/04/2018 03/16/2018 01/19/2017 03/14/2015  Decreased Interest 0 0 0 0 0  Down, Depressed, Hopeless 0 0 0 0 1  PHQ - 2 Score 0 0 0 0 1  Altered sleeping - 0 0 - -  Tired, decreased energy - 0 0 - -  Change in appetite - 0 0 - -  Feeling bad or failure about yourself  - 0 0 - -  Trouble concentrating - 0 0 - -  Moving slowly or fidgety/restless - 0 0 - -  Suicidal thoughts - 0 0 - -  PHQ-9 Score - 0 0 - -  Difficult doing work/chores - Not difficult at all - - -   Given by caregiver today  Fall Risk: Fall Risk  09/27/2018 06/04/2018 03/16/2018 05/25/2017 01/19/2017  Falls in the past year? 0 0 No No No  Number falls in past yr: - 0 - - -  Injury with Fall? 0 - - - -     Functional Status Survey: Is the patient deaf or have difficulty hearing?: No Does the patient have difficulty seeing, even when wearing glasses/contacts?: No Does the patient have difficulty concentrating, remembering, or making decisions?: Yes Does the patient have difficulty walking or climbing stairs?: No Does the patient have difficulty dressing or bathing?: Yes Does the patient have difficulty doing  errands alone such as visiting a doctor's office or shopping?: Yes    Assessment & Plan  1. Controlled type 2 diabetes mellitus with microalbuminuria, without long-term current use of insulin (HCC)  - POCT HgB A1C - Urine Microalbumin w/creat. ratio  2. Varicose veins, lower extremity, with inflammation, ulcerated, unspecified laterality ([place])  Doing well with compression stoking hoses   3. Obesity (BMI 30.0-34.9)  Discussed with the patient the risk posed by an increased BMI. Discussed importance of portion control, calorie counting and at least 150 minutes of physical activity weekly. Avoid sweet beverages and drink more water. Eat at least 6 servings of fruit and vegetables daily   4. Bipolar I disorder, single manic episode, moderate (HCC)  Sees Dr. Tamera Punt   5. Thrombocytopenia ([place])  Recheck CBC  6. Benign essential HTN  At goal   7. Gastroesophageal reflux disease without esophagitis  stable

## 2018-09-28 LAB — MICROALBUMIN / CREATININE URINE RATIO: Creatinine, Urine: 30 mg/dL (ref 20–320)

## 2018-10-02 ENCOUNTER — Other Ambulatory Visit: Payer: Self-pay | Admitting: Family Medicine

## 2018-10-02 DIAGNOSIS — R350 Frequency of micturition: Secondary | ICD-10-CM

## 2018-10-11 ENCOUNTER — Ambulatory Visit (INDEPENDENT_AMBULATORY_CARE_PROVIDER_SITE_OTHER): Payer: Medicare Other | Admitting: Podiatry

## 2018-10-11 ENCOUNTER — Encounter: Payer: Self-pay | Admitting: Podiatry

## 2018-10-11 DIAGNOSIS — M79675 Pain in left toe(s): Secondary | ICD-10-CM

## 2018-10-11 DIAGNOSIS — B351 Tinea unguium: Secondary | ICD-10-CM | POA: Diagnosis not present

## 2018-10-11 DIAGNOSIS — M79674 Pain in right toe(s): Secondary | ICD-10-CM

## 2018-10-11 DIAGNOSIS — E119 Type 2 diabetes mellitus without complications: Secondary | ICD-10-CM

## 2018-10-11 NOTE — Progress Notes (Signed)
Complaint:  Visit Type: Patient returns to my office for continued preventative foot care services. Complaint: Patient states" my nails have grown long and thick and become painful to walk and wear shoes"  The patient presents for preventative foot care services. No changes to ROS.  Patient is diabetic with no foot complications. Patient is diabetic.    Podiatric Exam: Vascular: dorsalis pedis and posterior tibial pulses are palpable bilateral. Capillary return is immediate. Temperature gradient is WNL. Skin turgor WNL  Sensorium: Normal Semmes Weinstein monofilament test. Normal tactile sensation bilaterally. Nail Exam: Pt has thick disfigured discolored nails with subungual debris noted bilateral entire nail second  through fifth toenails.  Previous nail surgery both hallux nails. Ulcer Exam: There is no evidence of ulcer or pre-ulcerative changes or infection. Orthopedic Exam: Muscle tone and strength are WNL. No limitations in general ROM. No crepitus or effusions noted. Foot type and digits show no abnormalities. Bony prominences are unremarkable. Skin: No Porokeratosis. No infection or ulcers  Diagnosis:  Onychomycosis, , Pain in right toe, pain in left toes  Treatment & Plan Procedures and Treatment: Consent by patient was obtained for treatment procedures. The patient understood the discussion of treatment and procedures well. All questions were answered thoroughly reviewed. Debridement of mycotic and hypertrophic toenails, 1 through 5 bilateral and clearing of subungual debris. No ulceration, no infection noted.  Return Visit-Office Procedure: Patient instructed to return to the office for a follow up visit 4 months for continued evaluation and treatment.    Gardiner Barefoot DPM

## 2018-10-13 ENCOUNTER — Other Ambulatory Visit: Payer: Self-pay | Admitting: Family Medicine

## 2018-10-13 NOTE — Telephone Encounter (Signed)
Refill request for general medication: Aspirin 81 mg  Last office visit: 09/27/2018  Follow-ups on file. 03/28/2019

## 2018-10-13 NOTE — Telephone Encounter (Signed)
Refill request for general medication: Debrox   Last office visit: 09/27/2018  Follow-ups on file. 03/28/2019   '

## 2018-11-03 ENCOUNTER — Other Ambulatory Visit: Payer: Self-pay | Admitting: Family Medicine

## 2018-11-03 DIAGNOSIS — E782 Mixed hyperlipidemia: Secondary | ICD-10-CM

## 2018-11-03 NOTE — Telephone Encounter (Signed)
Refill request for general medication. Lovaza   Last office visit 09/27/2018   Lab Results  Component Value Date   CHOL 123 03/26/2018   HDL 45 03/26/2018   LDLCALC 61 03/26/2018   TRIG 91 03/26/2018   CHOLHDL 2.7 03/26/2018    Follow up on 03/28/2019

## 2018-11-09 ENCOUNTER — Other Ambulatory Visit: Payer: Self-pay

## 2018-11-09 NOTE — Telephone Encounter (Signed)
Refill request for general medication: Ibuprofen 800 mg  Last office visit: 09/27/2018  Follow-ups on file. Tomorrow.

## 2018-11-10 ENCOUNTER — Other Ambulatory Visit: Payer: Self-pay

## 2018-11-10 ENCOUNTER — Ambulatory Visit (INDEPENDENT_AMBULATORY_CARE_PROVIDER_SITE_OTHER): Payer: Medicare Other | Admitting: Family Medicine

## 2018-11-10 ENCOUNTER — Encounter: Payer: Self-pay | Admitting: Family Medicine

## 2018-11-10 ENCOUNTER — Other Ambulatory Visit: Payer: Self-pay | Admitting: Family Medicine

## 2018-11-10 VITALS — BP 120/80 | HR 78 | Temp 97.4°F | Ht 74.0 in | Wt 262.9 lb

## 2018-11-10 DIAGNOSIS — N4889 Other specified disorders of penis: Secondary | ICD-10-CM | POA: Diagnosis not present

## 2018-11-10 DIAGNOSIS — N481 Balanitis: Secondary | ICD-10-CM | POA: Diagnosis not present

## 2018-11-10 MED ORDER — VITAMINS A & D EX OINT: 1.0000 "application " | TOPICAL_OINTMENT | CUTANEOUS | 0 refills | Status: DC | PRN

## 2018-11-10 MED ORDER — NYSTATIN 100000 UNIT/GM EX CREA: 1.0000 "application " | TOPICAL_CREAM | Freq: Two times a day (BID) | CUTANEOUS | 0 refills | Status: DC

## 2018-11-10 NOTE — Progress Notes (Signed)
Name: Wayne Jones   MRN: [id number]    DOB: 11-29-1960   Date:11/10/2018       Progress Note  Subjective  Chief Complaint  Chief Complaint  Patient presents with  . Rash    HPI  Rash: he lives in a group home with 5 other gentleman's . Staff told Deidre Ala Scott's caregiver - Naoma Diener to bring him over today to be evaluated for a rash on his genitalia . He has a history of masturbating daily. It is painful when he touches the area now. He baths by himself, so staff is unsure when the problem started.   Patient Active Problem List   Diagnosis Date Noted  . Varicose veins, lower extremity, with inflammation, ulcerated, unspecified laterality ([place]) 05/26/2017  . Bilateral lower extremity edema 04/14/2017  . MR (mental retardation) 03/17/2016  . Polypharmacy 01/10/2016  . Benign prostatic hypertrophy without urinary obstruction 01/09/2015  . Chronic kidney disease (CKD), stage I 01/09/2015  . Cognitive decline 01/09/2015  . Controlled type 2 diabetes mellitus with microalbuminuria ([place]) 01/09/2015  . Acid reflux 01/09/2015  . Hearing loss 01/09/2015  . HLD (hyperlipidemia) 01/09/2015  . Extreme obesity 01/09/2015  . Obstructive sleep apnea 01/09/2015  . Other specified causes of urethral stricture 01/09/2015  . Hernia of anterior abdominal wall 01/09/2015  . Incomplete bladder emptying 07/09/2012  . 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 Benign essential HTN 02/04/2007  . Bipolar I disorder, single manic episode, moderate ([place]) 02/04/2007    Social History   Tobacco Use  . Smoking status: Never Smoker  . Smokeless tobacco: Never Used  Substance Use Topics  . Alcohol use: No    Alcohol/week: 0.0 standard drinks     Current Outpatient Medications:  .  Alcohol Swabs (B-D SINGLE USE SWABS REGULAR) PADS, FINGERSTICK BLOOD SUGAR TEST ONCE DAILY IN THE MORNING PRIOR TO BREAKFAST. GLUCOSE GOAL 90-140 FASTING. IF>400 CALL MD, Disp: 100 each, Rfl: PRN .  ALPRAZolam (XANAX) 0.5  MG tablet, , Disp: , Rfl:  .  ARIPiprazole (ABILIFY) 20 MG tablet, Take 20 mg by mouth daily., Disp: , Rfl:  .  ASPIRIN ADULT LOW STRENGTH 81 MG EC tablet, TAKE ONE TABLET BY MOUTH EVERY DAY, Disp: 28 tablet, Rfl: 11 .  Aspirin-Calcium Carbonate (BAYER WOMENS) 81-777 MG TABS, Take by mouth., Disp: , Rfl:  .  ASSURE LANCE LANCETS MISC, 1 each by Does not apply route daily. Fingerstick blood sugar test once daily in the morning prior to breakfast. Glucose Goal: 90-140 Fasting. If >400 call MD Controlled type 2 diabetes mellitus with microalbuminuria, without long-term current use of insulin (HCC)  Dx: E11.29, Disp: 200 each, Rfl: 4 .  atorvastatin (LIPITOR) 10 MG tablet, TAKE ONE TABLET BY MOUTH EACH DAY. (IMPROVES CHOLESTEROL), Disp: 30 tablet, Rfl: 10 .  Blood Glucose Monitoring Suppl (TRUE METRIX METER) w/Device KIT, by Does not apply route., Disp: , Rfl:  .  carbamazepine (EQUETRO) 200 MG CP12 12 hr capsule, Take 1 capsule (200 mg total) by mouth 2 (two) times daily., Disp: 180 each, Rfl: 3 .  carbamide peroxide (DEBROX) 6.5 % OTIC solution, PLACE 5 DROPS INTO BOTH EARS TWO TIMES A DAY, Disp: 15 mL, Rfl: 0 .  chlorhexidine (PERIDEX) 0.12 % solution, Use as directed 15 mLs in the mouth or throat 2 (two) times daily., Disp: , Rfl:  .  clindamycin (CLINDAGEL) 1 % gel, Apply topically 2 (two) times daily., Disp: 30 g, Rfl: 11 .  clonazePAM (KLONOPIN) 1 MG tablet, Take 1  mg by mouth daily., Disp: , Rfl:  .  diphenhydrAMINE (BENADRYL) 25 MG tablet, Take by mouth., Disp: , Rfl:  .  diphenhydrAMINE (SOMINEX) 25 MG tablet, Take 25 mg by mouth at bedtime as needed for sleep., Disp: , Rfl:  .  Elastic Bandages & Supports (MEDICAL COMPRESSION SOCKS) MISC, 2 each by Does not apply route daily. Apply in am's and remove it at bedtime, Disp: 2 each, Rfl: 1 .  finasteride (PROSCAR) 5 MG tablet, Take 5 mg by mouth daily., Disp: , Rfl:  .  FLUoxetine (PROZAC) 20 MG capsule, TAKE 1 CAPSULE BY MOUTH DAILY, Disp: 30  capsule, Rfl: 10 .  gabapentin (NEURONTIN) 600 MG tablet, Take 600 mg by mouth at bedtime., Disp: , Rfl:  .  guaiFENesin-dextromethorphan (ROBITUSSIN DM) 100-10 MG/5ML syrup, Take 5 mLs by mouth every 4 (four) hours as needed for cough., Disp: , Rfl:  .  ibuprofen (ADVIL,MOTRIN) 800 MG tablet, Take 1 tablet (800 mg total) by mouth every 8 (eight) hours as needed., Disp: 30 tablet, Rfl: 0 .  lisinopril (PRINIVIL,ZESTRIL) 20 MG tablet, TAKE ONE TABLET BY MOUTH EVERY DAY, Disp: 30 tablet, Rfl: 10 .  loratadine (CLARITIN) 10 MG tablet, TAKE (1) TABLET BY MOUTH DAILY FOR ALLERGY., Disp: 30 tablet, Rfl: 10 .  neomycin-bacitracin-polymyxin (NEOSPORIN) [phone] ointment, Apply topically 4 (four) times daily. Apply with dressing change daily, Disp: 28.3 g, Rfl: 0 .  nystatin (MYCOSTATIN/NYSTOP) powder, APPLY 3 TIMES A DAY prn, Disp: 30 g, Rfl: 2 .  omega-3 acid ethyl esters (LOVAZA) 1 g capsule, TAKE 1 CAPSULE BY MOUTH 2 TIMES DAILY, Disp: 62 capsule, Rfl: 10 .  omeprazole (PRILOSEC) 20 MG capsule, TAKE 1 CAPSULE BY MOUTH ONCE A DAY.. (G.E.R.D.) DO NOT CRUSH, Disp: 30 capsule, Rfl: 10 .  ondansetron (ZOFRAN) 4 MG tablet, Take by mouth., Disp: , Rfl:  .  Polyethyl Glyc-Propyl Glyc PF (SYSTANE ULTRA PF) 0.4-0.3 % SOLN, Apply 1 each to eye daily., Disp: 30 each, Rfl: 11 .  propranolol ER (INDERAL LA) 60 MG 24 hr capsule, Take 1 capsule (60 mg total) by mouth daily. (Patient taking differently: Take 60 mg by mouth 2 (two) times daily. ), Disp: 90 capsule, Rfl: 3 .  ranitidine (ZANTAC) 150 MG tablet, TAKE 1 TABLET BY MOUTH AT BEDTIME, Disp: 30 tablet, Rfl: 10 .  tamsulosin (FLOMAX) 0.4 MG CAPS capsule, TAKE ONE CAPSULE BY MOUTH EVERY DAY AFTER SUPPER *DO NOT CRUSH*, Disp: 28 capsule, Rfl: 10 .  torsemide (DEMADEX) 20 MG tablet, Take by mouth., Disp: , Rfl:  .  traMADol (ULTRAM) 50 MG tablet, Take by mouth., Disp: , Rfl:  .  triamcinolone cream (KENALOG) 0.1 %, Apply 1 application topically 2 (two) times daily.  On abdominal rash only, Disp: 80 g, Rfl: 0 .  nystatin cream (MYCOSTATIN), Apply 1 application topically 2 (two) times daily., Disp: 30 g, Rfl: 0 .  Vitamins A & D (VITAMIN A & D) ointment, Apply 1 application topically as needed for dry skin., Disp: 45 g, Rfl: 0  No Known Allergies  ROS  Ten systems reviewed and is negative except as mentioned in HPI   Objective  Vitals:   11/10/18 0827  BP: 120/80  Pulse: 78  Temp: (!) 97.4 F (36.3 C)  TempSrc: Oral  SpO2: 96%  Weight: 262 lb 14.4 oz (119.3 kg)  Height: '6\' 2"'$  (1.88 m)    Body mass index is 33.75 kg/m.  Physical Exam  Constitutional: Patient appears well-developed. Obese  No distress.  HEENT: head atraumatic, normocephalic, pupils equal and reactive to light, neck supple, throat within normal limits Cardiovascular: Normal rate, regular rhythm and normal heart sounds.  No murmur heard. No BLE edema. Pulmonary/Chest: Effort normal and breath sounds normal. No respiratory distress. Abdominal: Soft.  There is no tenderness. Genitalia: foreskin has a nodule on right ventral side, some maceration and redness on right side of glands , no ulceration noticed.  Psychiatric: Patient has a flat affect, but cooperative.He is right hand dominant   Recent Results (from the past 2160 hour(s))  POCT HgB A1C     Status: Normal   Collection Time: 09/27/18 10:27 AM  Result Value Ref Range   Hemoglobin A1C     HbA1c POC (<> result, manual entry)     HbA1c, POC (prediabetic range)     HbA1c, POC (controlled diabetic range) 5.1 0.0 - 7.0 %  Urine Microalbumin w/creat. ratio     Status: None   Collection Time: 09/27/18 10:28 AM  Result Value Ref Range   Creatinine, Urine 30 20 - 320 mg/dL   Microalb, Ur <0.2 mg/dL    Comment: Reference Range Not established    Microalb Creat Ratio NOTE <30 mcg/mg creat    Comment: NOTE: The urine albumin value is less than  0.2 mg/dL therefore we are unable to calculate  excretion and/or creatinine  ratio. . The ADA defines abnormalities in albumin excretion as follows: Marland Kitchen Category         Result (mcg/mg creatinine) . Normal                    <30 Microalbuminuria         30-299  Clinical albuminuria   > OR = 300 . The ADA recommends that at least two of three specimens collected within a 3-6 month period be abnormal before considering a patient to be within a diagnostic category.   CBC with Differential/Platelet     Status: Abnormal   Collection Time: 09/27/18 11:08 AM  Result Value Ref Range   WBC 5.9 3.8 - 10.8 Thousand/uL   RBC 4.44 4.20 - 5.80 Million/uL   Hemoglobin 14.4 13.2 - 17.1 g/dL   HCT 40.8 38.5 - 50.0 %   MCV 91.9 80.0 - 100.0 fL   MCH 32.4 27.0 - 33.0 pg   MCHC 35.3 32.0 - 36.0 g/dL   RDW 12.7 11.0 - 15.0 %   Platelets 117 (L) 140 - 400 Thousand/uL   MPV 12.1 7.5 - 12.5 fL   Neutro Abs 4,018 1,500 - 7,800 cells/uL   Lymphs Abs 1,215 850 - 3,900 cells/uL   Absolute Monocytes 490 200 - 950 cells/uL   Eosinophils Absolute 148 15 - 500 cells/uL   Basophils Absolute 30 0 - 200 cells/uL   Neutrophils Relative % 68.1 %   Total Lymphocyte 20.6 %   Monocytes Relative 8.3 %   Eosinophils Relative 2.5 %   Basophils Relative 0.5 %     Assessment & Plan  1. Balanitis  - nystatin cream (MYCOSTATIN); Apply 1 application topically 2 (two) times daily.  Dispense: 30 g; Refill: 0 - Vitamins A & D (VITAMIN A & D) ointment; Apply 1 application topically as needed for dry skin.  Dispense: 45 g; Refill: 0  2. Foreskin swelling  Nodule, he may need to see urologist if no resolution, all the irritation is on the ventral right aspect of penis

## 2018-11-15 ENCOUNTER — Other Ambulatory Visit: Payer: Self-pay | Admitting: Family Medicine

## 2018-11-15 DIAGNOSIS — N481 Balanitis: Secondary | ICD-10-CM

## 2018-11-15 DIAGNOSIS — R350 Frequency of micturition: Secondary | ICD-10-CM

## 2018-11-23 ENCOUNTER — Other Ambulatory Visit: Payer: Self-pay

## 2018-11-23 MED ORDER — FAMOTIDINE 40 MG PO TABS: 40.0000 mg | ORAL_TABLET | Freq: Every day | ORAL | 0 refills | Status: DC

## 2019-01-10 ENCOUNTER — Other Ambulatory Visit: Payer: Self-pay

## 2019-01-10 ENCOUNTER — Ambulatory Visit (INDEPENDENT_AMBULATORY_CARE_PROVIDER_SITE_OTHER): Payer: Medicare Other | Admitting: Podiatry

## 2019-01-10 ENCOUNTER — Encounter: Payer: Self-pay | Admitting: Podiatry

## 2019-01-10 VITALS — Temp 98.0°F

## 2019-01-10 DIAGNOSIS — M79674 Pain in right toe(s): Secondary | ICD-10-CM

## 2019-01-10 DIAGNOSIS — B351 Tinea unguium: Secondary | ICD-10-CM

## 2019-01-10 DIAGNOSIS — E119 Type 2 diabetes mellitus without complications: Secondary | ICD-10-CM

## 2019-01-10 DIAGNOSIS — M79675 Pain in left toe(s): Secondary | ICD-10-CM

## 2019-01-10 NOTE — Progress Notes (Signed)
Complaint:  Visit Type: Patient returns to my office for continued preventative foot care services. Complaint: Patient states" my nails have grown long and thick and become painful to walk and wear shoes"  The patient presents for preventative foot care services. No changes to ROS.  Patient is diabetic with no foot complications.    Podiatric Exam: Vascular: dorsalis pedis and posterior tibial pulses are palpable bilateral. Capillary return is immediate. Temperature gradient is WNL. Skin turgor WNL  Sensorium: Normal Semmes Weinstein monofilament test. Normal tactile sensation bilaterally. Nail Exam: Pt has thick disfigured discolored nails with subungual debris noted bilateral entire nail second  through fifth toenails.  Previous nail surgery both hallux nails. Ulcer Exam: There is no evidence of ulcer or pre-ulcerative changes or infection. Orthopedic Exam: Muscle tone and strength are WNL. No limitations in general ROM. No crepitus or effusions noted. Foot type and digits show no abnormalities. Bony prominences are unremarkable. Skin: No Porokeratosis. No infection or ulcers  Diagnosis:  Onychomycosis, , Pain in right toe, pain in left toes  Treatment & Plan Procedures and Treatment: Consent by patient was obtained for treatment procedures. The patient understood the discussion of treatment and procedures well. All questions were answered thoroughly reviewed. Debridement of mycotic and hypertrophic toenails, 1 through 5 bilateral and clearing of subungual debris. No ulceration, no infection noted.  Return Visit-Office Procedure: Patient instructed to return to the office for a follow up visit 3 months for continued evaluation and treatment.    Gardiner Barefoot DPM

## 2019-02-28 ENCOUNTER — Telehealth: Payer: Self-pay

## 2019-02-28 NOTE — Telephone Encounter (Signed)
Copied from [place] [id number]. Topic: General - Other >> Feb 28, 2019  1:43 PM Yvette Rack wrote: Reason for CRM: Stanton Kidney with [facility] stated a letter was sent to the office asking if an appt is needed for pt but they have yet to receive a response. Mary stated pt blood pressure reading was 142/89 and his pulse ranges from 48 to 50. Mary requests call back. Cb# [phone]

## 2019-02-28 NOTE — Telephone Encounter (Signed)
Copied from [place] [id number]. Topic: General - Other >> Feb 28, 2019  1:43 PM Yvette Rack wrote: Reason for CRM: Stanton Kidney with [facility] stated a letter was sent to the office asking if an appt is needed for pt but they have yet to receive a response. Mary stated pt blood pressure reading was 142/89 and his pulse ranges from 48 to 50. Mary requests call back. Cb# [phone]

## 2019-03-01 NOTE — Telephone Encounter (Signed)
Informed caregiver Stanton Kidney with [facility] that his BP was good per Dr. Ancil Boozer and he has had pulse rates around 50's in the past.

## 2019-03-04 ENCOUNTER — Other Ambulatory Visit: Payer: Self-pay

## 2019-03-04 ENCOUNTER — Encounter: Payer: Self-pay | Admitting: Family Medicine

## 2019-03-04 ENCOUNTER — Ambulatory Visit (INDEPENDENT_AMBULATORY_CARE_PROVIDER_SITE_OTHER): Payer: Medicare Other | Admitting: Family Medicine

## 2019-03-04 VITALS — BP 118/72 | HR 58 | Temp 97.8°F | Resp 18 | Ht 74.0 in | Wt 252.2 lb

## 2019-03-04 DIAGNOSIS — R001 Bradycardia, unspecified: Secondary | ICD-10-CM | POA: Diagnosis not present

## 2019-03-04 DIAGNOSIS — I10 Essential (primary) hypertension: Secondary | ICD-10-CM | POA: Diagnosis not present

## 2019-03-04 DIAGNOSIS — E7800 Pure hypercholesterolemia, unspecified: Secondary | ICD-10-CM | POA: Diagnosis not present

## 2019-03-04 NOTE — Progress Notes (Signed)
Name: Wayne Jones   MRN: [id number]    DOB: 10-13-1960   Date:03/04/2019       Progress Note  Subjective  Chief Complaint  Chief Complaint  Patient presents with  . Bradycardia    heart rate has beenin the 50"s for 2 weeks. The lowest has been at 2    HPI  Pt presents today with caregiver from [place] - for concern regarding his heart rate and blood pressure.  There is concern for bradycardia as the caregivers have found HR 48-50 over the last 2 weeks; BP has been upper limit of normal.  Chart review does show several instances of HR in the 50's and 60's in the past, but I do not see instances of HR <50.  We performed EKG today which shows sinus bradycardia, possibly a mildly prolonged QT interval.  He is not taking betablocker, but is on several psychiatric medications.   No chest pain, shortness of breath, behavior changes, fatigue, near syncope, normal appetite. He is down 10lbs since April, caregiver notes he is no longer eating sausage and is drinking more water, exercising a lot more lately. Patient Active Problem List   Diagnosis Date Noted  . Varicose veins, lower extremity, with inflammation, ulcerated, unspecified laterality ([place]) 05/26/2017  . Bilateral lower extremity edema 04/14/2017  . MR (mental retardation) 03/17/2016  . Polypharmacy 01/10/2016  . Benign prostatic hypertrophy without urinary obstruction 01/09/2015  . Chronic kidney disease (CKD), stage I 01/09/2015  . Cognitive decline 01/09/2015  . Controlled type 2 diabetes mellitus with microalbuminuria ([place]) 01/09/2015  . Acid reflux 01/09/2015  . Hearing loss 01/09/2015  . HLD (hyperlipidemia) 01/09/2015  . Extreme obesity 01/09/2015  . Obstructive sleep apnea 01/09/2015  . Other specified causes of urethral stricture 01/09/2015  . Hernia of anterior abdominal wall 01/09/2015  . Incomplete bladder emptying 07/09/2012  . 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 Benign essential HTN 02/04/2007  .  Bipolar I disorder, single manic episode, moderate ([place]) 02/04/2007    Past Surgical History:  Procedure Laterality Date  . LAPAROSCOPIC CHOLECYSTECTOMY    . LAPAROSCOPY  12/03/2010  . VENTRAL HERNIA REPAIR      Family History  Family history unknown: Yes    Social History   Socioeconomic History  . Marital status: Single    Spouse name: Not on file  . Number of children: Not on file  . Years of education: Not on file  . Highest education level: Not on file  Occupational History  . Occupation: disabled    Comment: Patient in care home  Social Needs  . Financial resource strain: Not hard at all  . Food insecurity    Worry: Never true    Inability: Never true  . Transportation needs    Medical: No    Non-medical: No  Tobacco Use  . Smoking status: Never Smoker  . Smokeless tobacco: Never Used  Substance and Sexual Activity  . Alcohol use: No    Alcohol/week: 0.0 standard drinks  . Drug use: No  . Sexual activity: Never  Lifestyle  . Physical activity    Days per week: 0 days    Minutes per session: 0 min  . Stress: Not at all  Relationships  . Social Herbalist on phone: Not on file    Gets together: Not on file    Attends religious service: Not on file    Active member of club or organization: Not on file  Attends meetings of clubs or organizations: Not on file    Relationship status: Not on file  . Intimate partner violence    Fear of current or ex partner: Not on file    Emotionally abused: Not on file    Physically abused: Not on file    Forced sexual activity: Not on file  Other Topics Concern  . Not on file  Social History Narrative  . Not on file     Current Outpatient Medications:  .  Alcohol Swabs (B-D SINGLE USE SWABS REGULAR) PADS, FINGERSTICK BLOOD SUGAR TEST ONCE DAILY IN THE MORNING PRIOR TO BREAKFAST. GLUCOSE GOAL 90-140 FASTING. IF>400 CALL MD, Disp: 100 each, Rfl: PRN .  ALPRAZolam (XANAX) 0.5 MG tablet, , Disp: , Rfl:  .   ARIPiprazole (ABILIFY) 20 MG tablet, Take 20 mg by mouth daily., Disp: , Rfl:  .  ASPIRIN ADULT LOW STRENGTH 81 MG EC tablet, TAKE ONE TABLET BY MOUTH EVERY DAY, Disp: 28 tablet, Rfl: 11 .  Aspirin-Calcium Carbonate (BAYER WOMENS) 81-777 MG TABS, Take by mouth., Disp: , Rfl:  .  ASSURE LANCE LANCETS MISC, 1 each by Does not apply route daily. Fingerstick blood sugar test once daily in the morning prior to breakfast. Glucose Goal: 90-140 Fasting. If >400 call MD Controlled type 2 diabetes mellitus with microalbuminuria, without long-term current use of insulin (HCC)  Dx: E11.29, Disp: 200 each, Rfl: 4 .  atorvastatin (LIPITOR) 10 MG tablet, TAKE ONE TABLET BY MOUTH EACH DAY. (IMPROVES CHOLESTEROL), Disp: 30 tablet, Rfl: 10 .  Blood Glucose Monitoring Suppl (TRUE METRIX METER) w/Device KIT, by Does not apply route., Disp: , Rfl:  .  carbamazepine (EQUETRO) 200 MG CP12 12 hr capsule, Take 1 capsule (200 mg total) by mouth 2 (two) times daily., Disp: 180 each, Rfl: 3 .  carbamide peroxide (DEBROX) 6.5 % OTIC solution, PLACE 5 DROPS INTO BOTH EARS TWO TIMES A DAY, Disp: 15 mL, Rfl: 0 .  chlorhexidine (PERIDEX) 0.12 % solution, Use as directed 15 mLs in the mouth or throat 2 (two) times daily., Disp: , Rfl:  .  clindamycin (CLINDAGEL) 1 % gel, Apply topically 2 (two) times daily., Disp: 30 g, Rfl: 11 .  clonazePAM (KLONOPIN) 1 MG tablet, Take 1 mg by mouth daily., Disp: , Rfl:  .  diphenhydrAMINE (BENADRYL) 25 MG tablet, Take by mouth., Disp: , Rfl:  .  diphenhydrAMINE (SOMINEX) 25 MG tablet, Take 25 mg by mouth at bedtime as needed for sleep., Disp: , Rfl:  .  Elastic Bandages & Supports (MEDICAL COMPRESSION SOCKS) MISC, 2 each by Does not apply route daily. Apply in am's and remove it at bedtime, Disp: 2 each, Rfl: 1 .  famotidine (PEPCID) 20 MG tablet, , Disp: , Rfl:  .  famotidine (PEPCID) 40 MG tablet, Take 1 tablet (40 mg total) by mouth daily., Disp: 90 tablet, Rfl: 0 .  finasteride (PROSCAR) 5 MG  tablet, Take 5 mg by mouth daily., Disp: , Rfl:  .  FLUoxetine (PROZAC) 20 MG capsule, TAKE 1 CAPSULE BY MOUTH DAILY, Disp: 30 capsule, Rfl: 10 .  gabapentin (NEURONTIN) 600 MG tablet, Take 600 mg by mouth at bedtime., Disp: , Rfl:  .  guaiFENesin-dextromethorphan (ROBITUSSIN DM) 100-10 MG/5ML syrup, Take 5 mLs by mouth every 4 (four) hours as needed for cough., Disp: , Rfl:  .  ibuprofen (ADVIL,MOTRIN) 800 MG tablet, TAKE ONE TABLET BY MOUTH EVERY 8 HOURS AS NEEDED, Disp: 30 tablet, Rfl: 10 .  lisinopril (PRINIVIL,ZESTRIL)  20 MG tablet, TAKE ONE TABLET BY MOUTH EVERY DAY, Disp: 30 tablet, Rfl: 10 .  loratadine (CLARITIN) 10 MG tablet, TAKE (1) TABLET BY MOUTH DAILY FOR ALLERGY., Disp: 30 tablet, Rfl: 10 .  neomycin-bacitracin-polymyxin (NEOSPORIN) [phone] ointment, Apply topically 4 (four) times daily. Apply with dressing change daily, Disp: 28.3 g, Rfl: 0 .  nystatin (MYCOSTATIN/NYSTOP) powder, APPLY 3 TIMES A DAY prn, Disp: 30 g, Rfl: 2 .  nystatin cream (MYCOSTATIN), APPLY TOPICALLY TWO TIMES A DAY, Disp: 30 g, Rfl: 0 .  omega-3 acid ethyl esters (LOVAZA) 1 g capsule, TAKE 1 CAPSULE BY MOUTH 2 TIMES DAILY, Disp: 62 capsule, Rfl: 10 .  omeprazole (PRILOSEC) 20 MG capsule, TAKE 1 CAPSULE BY MOUTH ONCE A DAY.. (G.E.R.D.) DO NOT CRUSH, Disp: 30 capsule, Rfl: 10 .  ondansetron (ZOFRAN) 4 MG tablet, Take by mouth., Disp: , Rfl:  .  Polyethyl Glyc-Propyl Glyc PF (SYSTANE ULTRA PF) 0.4-0.3 % SOLN, Apply 1 each to eye daily., Disp: 30 each, Rfl: 11 .  propranolol ER (INDERAL LA) 60 MG 24 hr capsule, Take 1 capsule (60 mg total) by mouth daily. (Patient taking differently: Take 60 mg by mouth 2 (two) times daily. ), Disp: 90 capsule, Rfl: 3 .  tamsulosin (FLOMAX) 0.4 MG CAPS capsule, TAKE ONE CAPSULE BY MOUTH EVERY DAY AFTER SUPPER *DO NOT CRUSH*, Disp: 28 capsule, Rfl: 5 .  torsemide (DEMADEX) 20 MG tablet, Take by mouth., Disp: , Rfl:  .  traMADol (ULTRAM) 50 MG tablet, Take by mouth., Disp: , Rfl:   .  triamcinolone cream (KENALOG) 0.1 %, Apply 1 application topically 2 (two) times daily. On abdominal rash only, Disp: 80 g, Rfl: 0 .  Vitamins A & D (VITAMIN A & D) ointment, Apply 1 application topically as needed for dry skin., Disp: 45 g, Rfl: 0  No Known Allergies  I personally reviewed active problem list, medication list, allergies, notes from last encounter, lab results with the patient/caregiver today.   ROS  Constitutional: Negative for fever or weight change.  Respiratory: Negative for cough and shortness of breath.   Cardiovascular: Negative for chest pain or palpitations.  Gastrointestinal: Negative for abdominal pain, no bowel changes.  Musculoskeletal: Negative for gait problem or joint swelling.  Skin: Negative for rash.  Neurological: Negative for dizziness or headache.  No other specific complaints in a complete review of systems (except as listed in HPI above).   Objective  Vitals:   03/04/19 0958  BP: 118/72  Pulse: (!) 58  Resp: 18  Temp: 97.8 F (36.6 C)  TempSrc: Oral  SpO2: 99%  Weight: 252 lb 3.2 oz (114.4 kg)  Height: _0  (1.88 m)   Body mass index is 32.38 kg/m.  Physical Exam Constitutional: Patient appears well-developed and well-nourished. No distress.  HENT: Head: Normocephalic and atraumatic. Eyes: Conjunctivae and EOM are normal. No scleral icterus.   Neck: Normal range of motion. Neck supple. No JVD present. No thyromegaly present.  Cardiovascular: Bradycardic rate, regular rhythm and normal heart sounds.  No murmur heard. No BLE edema. Pulmonary/Chest: Effort normal and breath sounds normal. No respiratory distress. Musculoskeletal: Normal range of motion, no joint effusions. No gross deformities Neurological: Pt is alert and oriented to person, place, and time. No cranial nerve deficit. Coordination, balance, strength, speech and gait are normal.  Skin: Skin is warm and dry. No rash noted. No erythema.  Psychiatric: Patient has a  normal mood and affect. behavior is normal. Judgment and thought content normal.  No results found for this or any previous visit (from the past 72 hour(s)).   PHQ2/9: Depression screen [facility] 2/9 03/04/2019 11/10/2018 09/27/2018 06/04/2018 03/16/2018  Decreased Interest 0 0 0 0 0  Down, Depressed, Hopeless 0 0 0 0 0  PHQ - 2 Score 0 0 0 0 0  Altered sleeping 0 0 - 0 0  Tired, decreased energy 0 0 - 0 0  Change in appetite 0 0 - 0 0  Feeling bad or failure about yourself  0 0 - 0 0  Trouble concentrating 0 0 - 0 0  Moving slowly or fidgety/restless 0 0 - 0 0  Suicidal thoughts 0 0 - 0 0  PHQ-9 Score 0 0 - 0 0  Difficult doing work/chores Not difficult at all - - Not difficult at all -   PHQ-2/9 Result is negative.    Fall Risk: Fall Risk  03/04/2019 09/27/2018 06/04/2018 03/16/2018 05/25/2017  Falls in the past year? 0 0 0 No No  Number falls in past yr: 0 - 0 - -  Injury with Fall? 0 0 - - -  Follow up Falls evaluation completed - - - -    Assessment & Plan  1. Bradycardia - Labs today; EKG shows sinus bradycardia ?prolonged QT interval (mild) - CBC with Differential/Platelet - COMPLETE METABOLIC PANEL WITH GFR - Lipid panel - TSH - EKG 12-Lead - Referral to cardiology  2. Benign essential HTN - BP controlled today in office; not taking betablocker - CBC with Differential/Platelet - COMPLETE METABOLIC PANEL WITH GFR - Lipid panel - TSH - EKG 12-Lead  3. Pure hypercholesterolemia - Lipid panel

## 2019-03-05 LAB — COMPLETE METABOLIC PANEL WITH GFR
AG Ratio: 2.1 (calc) (ref 1.0–2.5)
ALT: 16 U/L (ref 9–46)
AST: 15 U/L (ref 10–35)
Albumin: 4.2 g/dL (ref 3.6–5.1)
Alkaline phosphatase (APISO): 89 U/L (ref 35–144)
BUN: 11 mg/dL (ref 7–25)
CO2: 25 mmol/L (ref 20–32)
Calcium: 8.8 mg/dL (ref 8.6–10.3)
Chloride: 104 mmol/L (ref 98–110)
Creat: 0.82 mg/dL (ref 0.70–1.33)
GFR, Est African American: 113 mL/min/{1.73_m2} (ref >=60)
GFR, Est Non African American: 97 mL/min/{1.73_m2} (ref >=60)
Globulin: 2 g/dL (calc) (ref 1.9–3.7)
Glucose, Bld: 105 mg/dL — ABNORMAL HIGH (ref 65–99)
Potassium: 4.1 mmol/L (ref 3.5–5.3)
Sodium: 140 mmol/L (ref 135–146)
Total Bilirubin: 0.5 mg/dL (ref 0.2–1.2)
Total Protein: 6.2 g/dL (ref 6.1–8.1)

## 2019-03-05 LAB — CBC WITH DIFFERENTIAL/PLATELET
Absolute Monocytes: 396 cells/uL (ref 200–950)
Basophils Absolute: 18 cells/uL (ref 0–200)
Basophils Relative: 0.4 %
Eosinophils Absolute: 120 cells/uL (ref 15–500)
Eosinophils Relative: 2.6 %
HCT: 41.3 % (ref 38.5–50.0)
Hemoglobin: 14.2 g/dL (ref 13.2–17.1)
Lymphs Abs: 1040 cells/uL (ref 850–3900)
MCH: 31.7 pg (ref 27.0–33.0)
MCHC: 34.4 g/dL (ref 32.0–36.0)
MCV: 92.2 fL (ref 80.0–100.0)
MPV: 11.6 fL (ref 7.5–12.5)
Monocytes Relative: 8.6 %
Neutro Abs: 3027 cells/uL (ref 1500–7800)
Neutrophils Relative %: 65.8 %
Platelets: 108 10*3/uL — ABNORMAL LOW (ref 140–400)
RBC: 4.48 10*6/uL (ref 4.20–5.80)
RDW: 12.7 % (ref 11.0–15.0)
Total Lymphocyte: 22.6 %
WBC: 4.6 10*3/uL (ref 3.8–10.8)

## 2019-03-05 LAB — TSH: TSH: 0.7 mIU/L (ref 0.40–4.50)

## 2019-03-05 LAB — LIPID PANEL
Cholesterol: 116 mg/dL (ref <200)
HDL: 43 mg/dL (ref >=40)
LDL Cholesterol (Calc): 57 mg/dL (calc)
Non-HDL Cholesterol (Calc): 73 mg/dL (calc) (ref <130)
Total CHOL/HDL Ratio: 2.7 (calc) (ref <5.0)
Triglycerides: 81 mg/dL (ref <150)

## 2019-03-08 DIAGNOSIS — F3489 Other specified persistent mood disorders: Secondary | ICD-10-CM | POA: Diagnosis not present

## 2019-03-28 ENCOUNTER — Other Ambulatory Visit: Payer: Self-pay

## 2019-03-28 ENCOUNTER — Encounter: Payer: Self-pay | Admitting: Family Medicine

## 2019-03-28 ENCOUNTER — Ambulatory Visit (INDEPENDENT_AMBULATORY_CARE_PROVIDER_SITE_OTHER): Payer: Medicare Other | Admitting: Family Medicine

## 2019-03-28 VITALS — BP 114/70 | HR 55 | Temp 97.3°F | Resp 16 | Ht 74.0 in | Wt 253.0 lb

## 2019-03-28 DIAGNOSIS — I10 Essential (primary) hypertension: Secondary | ICD-10-CM | POA: Diagnosis not present

## 2019-03-28 DIAGNOSIS — E7800 Pure hypercholesterolemia, unspecified: Secondary | ICD-10-CM | POA: Diagnosis not present

## 2019-03-28 DIAGNOSIS — F3012 Manic episode without psychotic symptoms, moderate: Secondary | ICD-10-CM

## 2019-03-28 DIAGNOSIS — R001 Bradycardia, unspecified: Secondary | ICD-10-CM | POA: Diagnosis not present

## 2019-03-28 DIAGNOSIS — K219 Gastro-esophageal reflux disease without esophagitis: Secondary | ICD-10-CM

## 2019-03-28 DIAGNOSIS — R809 Proteinuria, unspecified: Secondary | ICD-10-CM | POA: Diagnosis not present

## 2019-03-28 DIAGNOSIS — D696 Thrombocytopenia, unspecified: Secondary | ICD-10-CM | POA: Diagnosis not present

## 2019-03-28 DIAGNOSIS — E1129 Type 2 diabetes mellitus with other diabetic kidney complication: Secondary | ICD-10-CM | POA: Diagnosis not present

## 2019-03-28 DIAGNOSIS — F988 Other specified behavioral and emotional disorders with onset usually occurring in childhood and adolescence: Secondary | ICD-10-CM

## 2019-03-28 LAB — POCT GLYCOSYLATED HEMOGLOBIN (HGB A1C): Hemoglobin A1C: 5.1 % (ref 4.0–5.6)

## 2019-03-28 MED ORDER — LISINOPRIL 10 MG PO TABS: 10.0000 mg | ORAL_TABLET | Freq: Every day | ORAL | 5 refills | Status: DC

## 2019-03-28 NOTE — Progress Notes (Signed)
Name: Wayne Jones   MRN: [id number]    DOB: 1961/03/19   Date:03/28/2019       Progress Note  Subjective  Chief Complaint  Chief Complaint  Patient presents with  . Medication Refill  . Diabetes  . Obesity    Lost 8 pounds since last visit  . Varicose Veins  . Manic Behavior  . Hypertension    Denies any symptoms  . Gastroesophageal Reflux    HPI  DMII with microalbuminuria: he is now on ace, no side effects. His hgbA1Cis normal and not on medication, just life style modification.  Fasting glucose log was normal average low 100, 96-131 . He is on statin therapy and aspirin. No symptoms of hypoglycemia. He is now losing weight   Obesity: weight has been stable, We ordered a diabetic diet on his last visit.He has been exercising at the group home now  and he has lost weight since last visit   HTN: on beta-blocker and Ace, bp is towards low end of normal, no chest pain but has intermittent palpitation,no dizzinessoff diuretics.we will decrease dose of lisinopril from 20 mg to 10 mg and monitor   BPH: heused to seeDr. Jacqlyn Larsen, he still has nocturia, he still masturbates often and causes penile irritation . Now under the care of Dr. Kirke Corin   Dyslipidemia: taking statin and Lovaza. Reviewed labs done in August and was at goal   MR: lives in a group, under [facility] care, stable behavior, no wandering or aggression.He needs help taking medication and bathing, cannot self medicate.Stable   Bipolar disorder: taking medication, but behavior has been stable, does not seem to be depressed or manic. He sees psychiatrist, Dr. Tamera Punt, and since he lives at a group home he is compliant with medications   Edema Lower extremities: he has chronic lower extremity edema,seeing Dr. Lucky Cowboy now, doing better on compression stocking hoses. Swelling is significantly better  Penile irritation: he masturbates daily, but since COVID-19 he has less activities and even with door open  he is found masturbating at the house so they had to close the door again. Asked patient to try alternating dailys.   Patient Active Problem List   Diagnosis Date Noted  . Varicose veins, lower extremity, with inflammation, ulcerated, unspecified laterality ([place]) 05/26/2017  . Bilateral lower extremity edema 04/14/2017  . MR (mental retardation) 03/17/2016  . Polypharmacy 01/10/2016  . Benign prostatic hypertrophy without urinary obstruction 01/09/2015  . Chronic kidney disease (CKD), stage I 01/09/2015  . Cognitive decline 01/09/2015  . Controlled type 2 diabetes mellitus with microalbuminuria ([place]) 01/09/2015  . Acid reflux 01/09/2015  . Hearing loss 01/09/2015  . HLD (hyperlipidemia) 01/09/2015  . Extreme obesity 01/09/2015  . Obstructive sleep apnea 01/09/2015  . Other specified causes of urethral stricture 01/09/2015  . Hernia of anterior abdominal wall 01/09/2015  . Incomplete bladder emptying 07/09/2012  . 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 Benign essential HTN 02/04/2007  . Bipolar I disorder, single manic episode, moderate ([place]) 02/04/2007    Past Surgical History:  Procedure Laterality Date  . LAPAROSCOPIC CHOLECYSTECTOMY    . LAPAROSCOPY  12/03/2010  . VENTRAL HERNIA REPAIR      Family History  Family history unknown: Yes    Social History   Socioeconomic History  . Marital status: Single    Spouse name: Not on file  . Number of children: Not on file  . Years of education: Not on file  . Highest education level: Not on file  Occupational History  . Occupation: disabled    Comment: Patient in care home  Social Needs  . Financial resource strain: Not hard at all  . Food insecurity    Worry: Never true    Inability: Never true  . Transportation needs    Medical: No    Non-medical: No  Tobacco Use  . Smoking status: Never Smoker  . Smokeless tobacco: Never Used  Substance and Sexual Activity  . Alcohol use: No    Alcohol/week: 0.0 standard drinks   . Drug use: No  . Sexual activity: Never  Lifestyle  . Physical activity    Days per week: 0 days    Minutes per session: 0 min  . Stress: Not at all  Relationships  . Social Herbalist on phone: Twice a week    Gets together: Twice a week    Attends religious service: Never    Active member of club or organization: No    Attends meetings of clubs or organizations: Never    Relationship status: Never married  . Intimate partner violence    Fear of current or ex partner: No    Emotionally abused: No    Physically abused: No    Forced sexual activity: No  Other Topics Concern  . Not on file  Social History Narrative   [name] calls him twice weekly.      Current Outpatient Medications:  .  Alcohol Swabs (B-D SINGLE USE SWABS REGULAR) PADS, FINGERSTICK BLOOD SUGAR TEST ONCE DAILY IN THE MORNING PRIOR TO BREAKFAST. GLUCOSE GOAL 90-140 FASTING. IF>400 CALL MD, Disp: 100 each, Rfl: PRN .  ALPRAZolam (XANAX) 0.5 MG tablet, , Disp: , Rfl:  .  ARIPiprazole (ABILIFY) 20 MG tablet, Take 20 mg by mouth daily., Disp: , Rfl:  .  ASPIRIN ADULT LOW STRENGTH 81 MG EC tablet, TAKE ONE TABLET BY MOUTH EVERY DAY, Disp: 28 tablet, Rfl: 11 .  Aspirin-Calcium Carbonate (BAYER WOMENS) 81-777 MG TABS, Take by mouth., Disp: , Rfl:  .  ASSURE LANCE LANCETS MISC, 1 each by Does not apply route daily. Fingerstick blood sugar test once daily in the morning prior to breakfast. Glucose Goal: 90-140 Fasting. If >400 call MD Controlled type 2 diabetes mellitus with microalbuminuria, without long-term current use of insulin (HCC)  Dx: E11.29, Disp: 200 each, Rfl: 4 .  atorvastatin (LIPITOR) 10 MG tablet, TAKE ONE TABLET BY MOUTH EACH DAY. (IMPROVES CHOLESTEROL), Disp: 30 tablet, Rfl: 10 .  Blood Glucose Monitoring Suppl (TRUE METRIX METER) w/Device KIT, by Does not apply route., Disp: , Rfl:  .  carbamazepine (EQUETRO) 200 MG CP12 12 hr capsule, Take 1 capsule (200 mg total) by mouth 2 (two) times  daily., Disp: 180 each, Rfl: 3 .  carbamide peroxide (DEBROX) 6.5 % OTIC solution, PLACE 5 DROPS INTO BOTH EARS TWO TIMES A DAY, Disp: 15 mL, Rfl: 0 .  chlorhexidine (PERIDEX) 0.12 % solution, Use as directed 15 mLs in the mouth or throat 2 (two) times daily., Disp: , Rfl:  .  clindamycin (CLINDAGEL) 1 % gel, Apply topically 2 (two) times daily., Disp: 30 g, Rfl: 11 .  clonazePAM (KLONOPIN) 1 MG tablet, Take 1 mg by mouth daily., Disp: , Rfl:  .  diphenhydrAMINE (BENADRYL) 25 MG tablet, Take by mouth., Disp: , Rfl:  .  diphenhydrAMINE (SOMINEX) 25 MG tablet, Take 25 mg by mouth at bedtime as needed for sleep., Disp: , Rfl:  .  Elastic Bandages & Supports (  MEDICAL COMPRESSION SOCKS) MISC, 2 each by Does not apply route daily. Apply in am's and remove it at bedtime, Disp: 2 each, Rfl: 1 .  famotidine (PEPCID) 40 MG tablet, Take 1 tablet (40 mg total) by mouth daily., Disp: 90 tablet, Rfl: 0 .  finasteride (PROSCAR) 5 MG tablet, Take 5 mg by mouth daily., Disp: , Rfl:  .  FLUoxetine (PROZAC) 20 MG capsule, TAKE 1 CAPSULE BY MOUTH DAILY, Disp: 30 capsule, Rfl: 10 .  gabapentin (NEURONTIN) 600 MG tablet, Take 600 mg by mouth at bedtime., Disp: , Rfl:  .  guaiFENesin-dextromethorphan (ROBITUSSIN DM) 100-10 MG/5ML syrup, Take 5 mLs by mouth every 4 (four) hours as needed for cough., Disp: , Rfl:  .  ibuprofen (ADVIL,MOTRIN) 800 MG tablet, TAKE ONE TABLET BY MOUTH EVERY 8 HOURS AS NEEDED, Disp: 30 tablet, Rfl: 10 .  lisinopril (ZESTRIL) 10 MG tablet, Take 1 tablet (10 mg total) by mouth daily., Disp: 30 tablet, Rfl: 5 .  loratadine (CLARITIN) 10 MG tablet, TAKE (1) TABLET BY MOUTH DAILY FOR ALLERGY., Disp: 30 tablet, Rfl: 10 .  neomycin-bacitracin-polymyxin (NEOSPORIN) [phone] ointment, Apply topically 4 (four) times daily. Apply with dressing change daily, Disp: 28.3 g, Rfl: 0 .  nystatin (MYCOSTATIN/NYSTOP) powder, APPLY 3 TIMES A DAY prn, Disp: 30 g, Rfl: 2 .  nystatin cream (MYCOSTATIN), APPLY  TOPICALLY TWO TIMES A DAY, Disp: 30 g, Rfl: 0 .  omega-3 acid ethyl esters (LOVAZA) 1 g capsule, TAKE 1 CAPSULE BY MOUTH 2 TIMES DAILY, Disp: 62 capsule, Rfl: 10 .  omeprazole (PRILOSEC) 20 MG capsule, TAKE 1 CAPSULE BY MOUTH ONCE A DAY.. (G.E.R.D.) DO NOT CRUSH, Disp: 30 capsule, Rfl: 10 .  ondansetron (ZOFRAN) 4 MG tablet, Take by mouth., Disp: , Rfl:  .  Polyethyl Glyc-Propyl Glyc PF (SYSTANE ULTRA PF) 0.4-0.3 % SOLN, Apply 1 each to eye daily., Disp: 30 each, Rfl: 11 .  propranolol ER (INDERAL LA) 60 MG 24 hr capsule, Take 1 capsule (60 mg total) by mouth daily. (Patient taking differently: Take 60 mg by mouth 2 (two) times daily. ), Disp: 90 capsule, Rfl: 3 .  tamsulosin (FLOMAX) 0.4 MG CAPS capsule, TAKE ONE CAPSULE BY MOUTH EVERY DAY AFTER SUPPER *DO NOT CRUSH*, Disp: 28 capsule, Rfl: 5 .  traMADol (ULTRAM) 50 MG tablet, Take by mouth., Disp: , Rfl:  .  triamcinolone cream (KENALOG) 0.1 %, Apply 1 application topically 2 (two) times daily. On abdominal rash only, Disp: 80 g, Rfl: 0 .  Vitamins A & D (VITAMIN A & D) ointment, Apply 1 application topically as needed for dry skin., Disp: 45 g, Rfl: 0  No Known Allergies  I personally reviewed active problem list, medication list, allergies, family history, social history with the patient/caregiver today.   ROS  Ten systems reviewed and is negative except as mentioned in HPI , poor historian   Objective  Vitals:   03/28/19 1131  BP: 114/70  Pulse: (!) 55  Resp: 16  Temp: (!) 97.3 F (36.3 C)  TempSrc: Temporal  SpO2: 99%  Weight: 253 lb (114.8 kg)  Height: '6\' 2"'$  (1.88 m)    Body mass index is 32.48 kg/m.  Physical Exam  Constitutional: Patient appears well-developed and well-nourished. Obese  No distress.  HEENT: head atraumatic, normocephalic, pupils equal and reactive to light,  neck supple Cardiovascular: Normal rate, regular rhythm and normal heart sounds.  No murmur heard. No BLE edema. Pulmonary/Chest: Effort  normal and breath sounds normal. No respiratory distress.  Abdominal: Soft.  There is no tenderness. GU: foreskin is swollen on left side, no erythema, likely from frequent masturbation, did not feel a nodule today  Skin: small excoriation on left lower leg, inner side, healing now  Psychiatric: Patient has a flat affect.. Cooperative   Recent Results (from the past 2160 hour(s))  CBC with Differential/Platelet     Status: Abnormal   Collection Time: 03/04/19 10:27 AM  Result Value Ref Range   WBC 4.6 3.8 - 10.8 Thousand/uL   RBC 4.48 4.20 - 5.80 Million/uL   Hemoglobin 14.2 13.2 - 17.1 g/dL   HCT 41.3 38.5 - 50.0 %   MCV 92.2 80.0 - 100.0 fL   MCH 31.7 27.0 - 33.0 pg   MCHC 34.4 32.0 - 36.0 g/dL   RDW 12.7 11.0 - 15.0 %   Platelets 108 (L) 140 - 400 Thousand/uL   MPV 11.6 7.5 - 12.5 fL   Neutro Abs 3,027 1,500 - 7,800 cells/uL   Lymphs Abs 1,040 850 - 3,900 cells/uL   Absolute Monocytes 396 200 - 950 cells/uL   Eosinophils Absolute 120 15 - 500 cells/uL   Basophils Absolute 18 0 - 200 cells/uL   Neutrophils Relative % 65.8 %   Total Lymphocyte 22.6 %   Monocytes Relative 8.6 %   Eosinophils Relative 2.6 %   Basophils Relative 0.4 %   Smear Review      Comment: Review of peripheral smear confirms automated results.   COMPLETE METABOLIC PANEL WITH GFR     Status: Abnormal   Collection Time: 03/04/19 10:27 AM  Result Value Ref Range   Glucose, Bld 105 (H) 65 - 99 mg/dL    Comment: .            Fasting reference interval . For someone without known diabetes, a glucose value between 100 and 125 mg/dL is consistent with prediabetes and should be confirmed with a follow-up test. .    BUN 11 7 - 25 mg/dL   Creat 0.82 0.70 - 1.33 mg/dL    Comment: For patients >17 years of age, the reference limit for Creatinine is approximately 13% higher for people identified as African-American. .    GFR, Est Non African American 97 > OR = 60 mL/min/1.4m   GFR, Est African American  113 > OR = 60 mL/min/1.750m  BUN/Creatinine Ratio NOT APPLICABLE 6 - 22 (calc)   Sodium 140 135 - 146 mmol/L   Potassium 4.1 3.5 - 5.3 mmol/L   Chloride 104 98 - 110 mmol/L   CO2 25 20 - 32 mmol/L   Calcium 8.8 8.6 - 10.3 mg/dL   Total Protein 6.2 6.1 - 8.1 g/dL   Albumin 4.2 3.6 - 5.1 g/dL   Globulin 2.0 1.9 - 3.7 g/dL (calc)   AG Ratio 2.1 1.0 - 2.5 (calc)   Total Bilirubin 0.5 0.2 - 1.2 mg/dL   Alkaline phosphatase (APISO) 89 35 - 144 U/L   AST 15 10 - 35 U/L   ALT 16 9 - 46 U/L  Lipid panel     Status: None   Collection Time: 03/04/19 10:27 AM  Result Value Ref Range   Cholesterol 116 <200 mg/dL   HDL 43 > OR = 40 mg/dL   Triglycerides 81 <150 mg/dL   LDL Cholesterol (Calc) 57 mg/dL (calc)    Comment: Reference range: <100 . Desirable range <100 mg/dL for primary prevention;   <70 mg/dL for patients with CHD or diabetic patients  with >  or = 2 CHD risk factors. Marland Kitchen LDL-C is now calculated using the Martin-Hopkins  calculation, which is a validated novel method providing  better accuracy than the Friedewald equation in the  estimation of LDL-C.  Cresenciano Genre et al. Annamaria Helling. 9767;341(93): 2061-2068  (http://education.[employer].com/faq/FAQ164)    Total CHOL/HDL Ratio 2.7 <5.0 (calc)   Non-HDL Cholesterol (Calc) 73 <130 mg/dL (calc)    Comment: For patients with diabetes plus 1 major ASCVD risk  factor, treating to a non-HDL-C goal of <100 mg/dL  (LDL-C of <70 mg/dL) is considered a therapeutic  option.   TSH     Status: None   Collection Time: 03/04/19 10:27 AM  Result Value Ref Range   TSH 0.70 0.40 - 4.50 mIU/L  POCT HgB A1C     Status: Normal   Collection Time: 03/28/19 11:49 AM  Result Value Ref Range   Hemoglobin A1C 5.1 4.0 - 5.6 %   HbA1c POC (<> result, manual entry)     HbA1c, POC (prediabetic range)     HbA1c, POC (controlled diabetic range)      Diabetic Foot Exam: Diabetic Foot Exam - Simple   Simple Foot Form Visual Inspection See comments:  Yes Sensation Testing Intact to touch and monofilament testing bilaterally: Yes Pulse Check Posterior Tibialis and Dorsalis pulse intact bilaterally: Yes Comments Thick toe nails, absent toe nails and hammer toe second toe on the left foot       PHQ2/9: Depression screen [facility] 2/9 03/28/2019 03/04/2019 11/10/2018 09/27/2018 06/04/2018  Decreased Interest 0 0 0 0 0  Down, Depressed, Hopeless 0 0 0 0 0  PHQ - 2 Score 0 0 0 0 0  Altered sleeping 0 0 0 - 0  Tired, decreased energy 0 0 0 - 0  Change in appetite 0 0 0 - 0  Feeling bad or failure about yourself  0 0 0 - 0  Trouble concentrating 0 0 0 - 0  Moving slowly or fidgety/restless 0 0 0 - 0  Suicidal thoughts 0 0 0 - 0  PHQ-9 Score 0 0 0 - 0  Difficult doing work/chores Not difficult at all Not difficult at all - - Not difficult at all    phq 9 is negative   Fall Risk: Fall Risk  03/04/2019 09/27/2018 06/04/2018 03/16/2018 05/25/2017  Falls in the past year? 0 0 0 No No  Number falls in past yr: 0 - 0 - -  Injury with Fall? 0 0 - - -  Follow up Falls evaluation completed - - - -    Functional Status Survey: Is the patient deaf or have difficulty hearing?: No Does the patient have difficulty seeing, even when wearing glasses/contacts?: No Does the patient have difficulty concentrating, remembering, or making decisions?: Yes Does the patient have difficulty walking or climbing stairs?: No Does the patient have difficulty dressing or bathing?: No Does the patient have difficulty doing errands alone such as visiting a doctor's office or shopping?: Yes    Assessment & Plan  1. Controlled type 2 diabetes mellitus with microalbuminuria, without long-term current use of insulin (HCC)  - POCT HgB A1C - lisinopril (ZESTRIL) 10 MG tablet; Take 1 tablet (10 mg total) by mouth daily.  Dispense: 30 tablet; Refill: 5  2. Benign essential HTN  Towards low end of normal, we will change lisinopril from 20 mg to 10 mg and monitor   3.  Pure hypercholesterolemia  On statin   4. Bradycardia  On high dose beta blocker  given by Dr. Tamera Punt   5. Bipolar I disorder, single manic episode, moderate (HCC)  stable  6. Thrombocytopenia ([place])  stable  7. Gastroesophageal reflux disease, esophagitis presence not specified   8. Morbid obesity, unspecified obesity type [facility])  Discussed with the patient the risk posed by an increased BMI. Discussed importance of portion control, calorie counting and at least 150 minutes of physical activity weekly. Avoid sweet beverages and drink more water. Eat at least 6 servings of fruit and vegetables daily   9. Masturbation  With penile irritation, advised group home to buy him lotion/to keep it in his room since it is causing foreskin irritation

## 2019-03-31 ENCOUNTER — Ambulatory Visit: Payer: Medicare Other | Admitting: Family Medicine

## 2019-04-08 ENCOUNTER — Ambulatory Visit: Payer: Medicare Other

## 2019-04-14 ENCOUNTER — Ambulatory Visit: Payer: Medicare Other | Admitting: Podiatry

## 2019-04-20 ENCOUNTER — Ambulatory Visit (INDEPENDENT_AMBULATORY_CARE_PROVIDER_SITE_OTHER): Payer: Medicare Other

## 2019-04-20 ENCOUNTER — Other Ambulatory Visit: Payer: Self-pay

## 2019-04-20 DIAGNOSIS — Z23 Encounter for immunization: Secondary | ICD-10-CM

## 2019-04-27 ENCOUNTER — Other Ambulatory Visit: Payer: Self-pay

## 2019-04-27 DIAGNOSIS — E782 Mixed hyperlipidemia: Secondary | ICD-10-CM

## 2019-04-27 MED ORDER — OMEGA-3-ACID ETHYL ESTERS 1 G PO CAPS: ORAL_CAPSULE | ORAL | 10 refills | Status: DC

## 2019-04-27 NOTE — Telephone Encounter (Signed)
Refill request for general medication. Lovaza to [place]  Last office visit 03/28/2019   Follow up on 06/07/2019

## 2019-06-03 ENCOUNTER — Other Ambulatory Visit: Payer: Self-pay | Admitting: Family Medicine

## 2019-06-03 DIAGNOSIS — Z7982 Long term (current) use of aspirin: Secondary | ICD-10-CM | POA: Diagnosis not present

## 2019-06-03 DIAGNOSIS — Z9049 Acquired absence of other specified parts of digestive tract: Secondary | ICD-10-CM | POA: Diagnosis not present

## 2019-06-03 DIAGNOSIS — R350 Frequency of micturition: Secondary | ICD-10-CM | POA: Diagnosis not present

## 2019-06-03 DIAGNOSIS — I10 Essential (primary) hypertension: Secondary | ICD-10-CM | POA: Diagnosis not present

## 2019-06-03 DIAGNOSIS — E785 Hyperlipidemia, unspecified: Secondary | ICD-10-CM | POA: Diagnosis not present

## 2019-06-03 DIAGNOSIS — R3916 Straining to void: Secondary | ICD-10-CM | POA: Diagnosis not present

## 2019-06-03 DIAGNOSIS — E782 Mixed hyperlipidemia: Secondary | ICD-10-CM

## 2019-06-03 DIAGNOSIS — E039 Hypothyroidism, unspecified: Secondary | ICD-10-CM | POA: Diagnosis not present

## 2019-06-03 DIAGNOSIS — N411 Chronic prostatitis: Secondary | ICD-10-CM | POA: Diagnosis not present

## 2019-06-03 DIAGNOSIS — R351 Nocturia: Secondary | ICD-10-CM | POA: Diagnosis not present

## 2019-06-03 DIAGNOSIS — R3912 Poor urinary stream: Secondary | ICD-10-CM | POA: Diagnosis not present

## 2019-06-03 DIAGNOSIS — R339 Retention of urine, unspecified: Secondary | ICD-10-CM | POA: Diagnosis not present

## 2019-06-03 DIAGNOSIS — N401 Enlarged prostate with lower urinary tract symptoms: Secondary | ICD-10-CM | POA: Diagnosis not present

## 2019-06-03 DIAGNOSIS — F319 Bipolar disorder, unspecified: Secondary | ICD-10-CM | POA: Diagnosis not present

## 2019-06-03 DIAGNOSIS — F79 Unspecified intellectual disabilities: Secondary | ICD-10-CM | POA: Diagnosis not present

## 2019-06-03 DIAGNOSIS — N4889 Other specified disorders of penis: Secondary | ICD-10-CM | POA: Diagnosis not present

## 2019-06-03 MED ORDER — OMEGA-3-ACID ETHYL ESTERS 1 G PO CAPS: ORAL_CAPSULE | ORAL | 10 refills | Status: DC

## 2019-06-03 NOTE — Telephone Encounter (Signed)
Medication Refill - Medication: omega-3 acid ethyl esters (LOVAZA) 1 g capsule 30 day supply request   Has the patient contacted their pharmacy? Yes.   (Agent: If no, request that the patient contact the pharmacy for the refill.) (Agent: If yes, when and what did the pharmacy advise?)  Preferred Pharmacy (with phone number or street name): [place], [place] - [address] [place]  [address] [place] [address]  Phone: [phone] Fax: [phone]      Agent: Please be advised that RX refills may take up to 3 business days. We ask that you follow-up with your pharmacy.

## 2019-06-07 ENCOUNTER — Ambulatory Visit (INDEPENDENT_AMBULATORY_CARE_PROVIDER_SITE_OTHER): Payer: Medicare Other

## 2019-06-07 VITALS — BP 138/87 | Temp 97.8°F | Ht 74.0 in | Wt 246.0 lb

## 2019-06-07 DIAGNOSIS — Z0000 Encounter for general adult medical examination without abnormal findings: Secondary | ICD-10-CM

## 2019-06-07 NOTE — Progress Notes (Signed)
Subjective:   Wayne Jones is a 58 y.o. male who presents for Medicare Annual/Subsequent preventive examination.  Virtual Visit via Telephone Note  I connected with [name] on 06/07/19 at  9:20 AM EST by telephone and verified that I am speaking with the correct person using two identifiers.  Medicare Annual Wellness visit completed telephonically due to Covid-19 pandemic.   Location: Patient: home Provider: office   I discussed the limitations, risks, security and privacy concerns of performing an evaluation and management service by telephone and the availability of in person appointments. The patient expressed understanding and agreed to proceed.  Some vital signs may be absent or patient reported.   Patient is a resident at [facility] at [facility]. Visit completed with assistance from [employer], [place].   [name], LPN    Review of Systems:   Cardiac Risk Factors include: advanced age (>69mn, >>30women);diabetes mellitus;dyslipidemia;hypertension;male gender;obesity (BMI >30kg/m2)     Objective:    Vitals: BP 138/87   Temp 97.8 F (36.6 C)   Ht '6\' 2"'$  (1.88 m)   Wt 246 lb (111.6 kg)   BMI 31.58 kg/m   Body mass index is 31.58 kg/m.  Advanced Directives 05/26/2017 03/24/2017 02/25/2017 02/18/2017 02/11/2017 01/19/2017 10/05/2016  Does Patient Have a Medical Advance Directive? Yes No No No No No (No Data)  Type of Advance Directive [place] Does patient want to make changes to medical advance directive? - - - - - - -  Copy of [place] Chart? - - - - - - -    Tobacco Social History   Tobacco Use  Smoking Status Never Smoker  Smokeless Tobacco Never Used     Counseling given: Not Answered   Clinical Intake:  Pre-visit preparation completed: Yes  Pain : No/denies pain     BMI - recorded: 31.58 Nutritional Status: BMI > 30  Obese Nutritional  Risks: None Diabetes: Yes CBG done?: No Did pt. bring in CBG monitor from home?: No   Nutrition Risk Assessment:  Has the patient had any N/V/D within the last 2 months?  No  Does the patient have any non-healing wounds?  No  Has the patient had any unintentional weight loss or weight gain?  No   Diabetes:  Is the patient diabetic?  Yes  If diabetic, was a CBG obtained today?  No  Did the patient bring in their glucometer from home?  No  How often do you monitor your CBG's? Daily fasting in AM, today = 94..   Financial Strains and Diabetes Management:  Are you having any financial strains with the device, your supplies or your medication? No .  Does the patient want to be seen by Chronic Care Management for management of their diabetes?  No  Would the patient like to be referred to a Nutritionist or for Diabetic Management?  No   Diabetic Exams:  Diabetic Eye Exam: Completed 07/22/18 negative retinopathy.   Diabetic Foot Exam: Completed 01/21/18. Pt has been advised about the importance in completing this exam. Pt is scheduled for diabetic foot exam on 07/22/19.   How often do you need to have someone help you when you read instructions, pamphlets, or other written materials from your doctor or pharmacy?: 5 - Always  Interpreter Needed?: No  Information entered by :: KClemetine MarkerLPN  Past Medical History:  Diagnosis Date  .  Depression   . Diabetes mellitus without complication ([place])   . Hyperlipidemia   . Hypertension   . Mental disorder    Past Surgical History:  Procedure Laterality Date  . LAPAROSCOPIC CHOLECYSTECTOMY    . LAPAROSCOPY  12/03/2010  . VENTRAL HERNIA REPAIR     Family History  Family history unknown: Yes   Social History   Socioeconomic History  . Marital status: Single    Spouse name: Not on file  . Number of children: Not on file  . Years of education: Not on file  . Highest education level: Not on file  Occupational History  .  Occupation: disabled    Comment: Patient in care home  Social Needs  . Financial resource strain: Not hard at all  . Food insecurity    Worry: Never true    Inability: Never true  . Transportation needs    Medical: No    Non-medical: No  Tobacco Use  . Smoking status: Never Smoker  . Smokeless tobacco: Never Used  Substance and Sexual Activity  . Alcohol use: No    Alcohol/week: 0.0 standard drinks  . Drug use: No  . Sexual activity: Never  Lifestyle  . Physical activity    Days per week: 7 days    Minutes per session: 10 min  . Stress: Not at all  Relationships  . Social Herbalist on phone: Patient refused    Gets together: Patient refused    Attends religious service: Never    Active member of club or organization: No    Attends meetings of clubs or organizations: Never    Relationship status: Never married  Other Topics Concern  . Not on file  Social History Narrative   [name] calls him twice weekly. Pt resides at [facility] group home at [facility].     Outpatient Encounter Medications as of 06/07/2019  Medication Sig  . Alcohol Swabs (B-D SINGLE USE SWABS REGULAR) PADS FINGERSTICK BLOOD SUGAR TEST ONCE DAILY IN THE MORNING PRIOR TO BREAKFAST. GLUCOSE GOAL 90-140 FASTING. IF>400 CALL MD  . ARIPiprazole (ABILIFY) 20 MG tablet Take 20 mg by mouth daily.  . ASPIRIN ADULT LOW STRENGTH 81 MG EC tablet TAKE ONE TABLET BY MOUTH EVERY DAY  . ASSURE LANCE LANCETS MISC 1 each by Does not apply route daily. Fingerstick blood sugar test once daily in the morning prior to breakfast. Glucose Goal: 90-140 Fasting. If >400 call MD Controlled type 2 diabetes mellitus with microalbuminuria, without long-term current use of insulin (HCC)  Dx: E11.29  . atorvastatin (LIPITOR) 10 MG tablet TAKE ONE TABLET BY MOUTH EACH DAY. (IMPROVES CHOLESTEROL)  . Blood Glucose Monitoring Suppl (TRUE METRIX METER) w/Device KIT by Does not apply route.  . carbamazepine (EQUETRO) 200 MG  CP12 12 hr capsule Take 1 capsule (200 mg total) by mouth 2 (two) times daily.  . carbamide peroxide (DEBROX) 6.5 % OTIC solution PLACE 5 DROPS INTO BOTH EARS TWO TIMES A DAY  . Cholecalciferol (VITAMIN D3) 1.25 MG (50000 UT) CAPS Take 1 capsule by mouth every 14 (fourteen) days.  . clonazePAM (KLONOPIN) 1 MG tablet Take 1 mg by mouth daily.  Water engineer Bandages & Supports (MEDICAL COMPRESSION SOCKS) MISC 2 each by Does not apply route daily. Apply in am's and remove it at bedtime  . famotidine (PEPCID) 40 MG tablet Take 1 tablet (40 mg total) by mouth daily.  . finasteride (PROSCAR) 5 MG tablet Take 5 mg by  mouth daily.  Marland Kitchen FLUoxetine (PROZAC) 20 MG capsule TAKE 1 CAPSULE BY MOUTH DAILY  . gabapentin (NEURONTIN) 600 MG tablet Take 600 mg by mouth at bedtime.  Marland Kitchen ibuprofen (ADVIL,MOTRIN) 800 MG tablet TAKE ONE TABLET BY MOUTH EVERY 8 HOURS AS NEEDED  . lisinopril (ZESTRIL) 10 MG tablet Take 1 tablet (10 mg total) by mouth daily.  Marland Kitchen loratadine (CLARITIN) 10 MG tablet TAKE (1) TABLET BY MOUTH DAILY FOR ALLERGY.  . nystatin cream (MYCOSTATIN) APPLY TOPICALLY TWO TIMES A DAY  . omega-3 acid ethyl esters (LOVAZA) 1 g capsule TAKE 1 CAPSULE BY MOUTH 2 TIMES DAILY  . omeprazole (PRILOSEC) 20 MG capsule TAKE 1 CAPSULE BY MOUTH ONCE A DAY.. (G.E.R.D.) DO NOT CRUSH  . ondansetron (ZOFRAN) 4 MG tablet Take by mouth.  Vladimir Faster Glyc-Propyl Glyc PF (SYSTANE ULTRA PF) 0.4-0.3 % SOLN Apply 1 each to eye daily.  . propranolol ER (INDERAL LA) 60 MG 24 hr capsule Take 1 capsule (60 mg total) by mouth daily. (Patient taking differently: Take 60 mg by mouth 2 (two) times daily. )  . tamsulosin (FLOMAX) 0.4 MG CAPS capsule TAKE ONE CAPSULE BY MOUTH EVERY DAY AFTER SUPPER *DO NOT CRUSH*  . Vitamins A & D (VITAMIN A & D) ointment Apply 1 application topically as needed for dry skin.  . [DISCONTINUED] ALPRAZolam (XANAX) 0.5 MG tablet   . [DISCONTINUED] Aspirin-Calcium Carbonate (BAYER WOMENS) [phone] MG TABS Take by  mouth.  . [DISCONTINUED] chlorhexidine (PERIDEX) 0.12 % solution Use as directed 15 mLs in the mouth or throat 2 (two) times daily.  . [DISCONTINUED] clindamycin (CLINDAGEL) 1 % gel Apply topically 2 (two) times daily.  . [DISCONTINUED] diphenhydrAMINE (BENADRYL) 25 MG tablet Take by mouth.  . [DISCONTINUED] diphenhydrAMINE (SOMINEX) 25 MG tablet Take 25 mg by mouth at bedtime as needed for sleep.  . [DISCONTINUED] guaiFENesin-dextromethorphan (ROBITUSSIN DM) 100-10 MG/5ML syrup Take 5 mLs by mouth every 4 (four) hours as needed for cough.  . [DISCONTINUED] lisinopril (ZESTRIL) 20 MG tablet Take 20 mg by mouth daily.  . [DISCONTINUED] neomycin-bacitracin-polymyxin (NEOSPORIN) [id number] ointment Apply topically 4 (four) times daily. Apply with dressing change daily  . [DISCONTINUED] nystatin (MYCOSTATIN/NYSTOP) powder APPLY 3 TIMES A DAY prn  . [DISCONTINUED] traMADol (ULTRAM) 50 MG tablet Take by mouth.  . [DISCONTINUED] triamcinolone cream (KENALOG) 0.1 % Apply 1 application topically 2 (two) times daily. On abdominal rash only   No facility-administered encounter medications on file as of 06/07/2019.     Activities of Daily Living In your present state of health, do you have any difficulty performing the following activities: 06/07/2019 03/28/2019  Hearing? N N  Comment declines hearing aids -  Vision? N N  Difficulty concentrating or making decisions? Tempie Donning  Walking or climbing stairs? N N  Dressing or bathing? N N  Doing errands, shopping? Tempie Donning  Preparing Food and eating ? Y -  Comment does not prepare food but eats independently -  Using the Toilet? N -  In the past six months, have you accidently leaked urine? N -  Do you have problems with loss of bowel control? N -  Managing your Medications? Y -  Managing your Finances? Y -  Housekeeping or managing your Housekeeping? Y -  Some recent data might be hidden    Patient Care Team: Steele Sizer, MD as PCP - General (Family  Medicine) Alvy Bimler, MD as Referring Physician (Psychiatry) Gardiner Barefoot, DPM as Consulting Physician (Podiatry) Saul Fordyce, MD (Inactive) (  Urology)   Assessment:   This is a routine wellness examination for Shakur.  Exercise Activities and Dietary recommendations Current Exercise Habits: Home exercise routine, Type of exercise: walking, Time (Minutes): 10, Frequency (Times/Week): 7, Weekly Exercise (Minutes/Week): 70, Intensity: Mild, Exercise limited by: None identified  Goals    . DIET - EAT MORE FRUITS AND VEGETABLES    . Exercise 3x per week (30 min per time)       Fall Risk Fall Risk  06/07/2019 03/04/2019 09/27/2018 06/04/2018 03/16/2018  Falls in the past year? 0 0 0 0 No  Number falls in past yr: 0 0 - 0 -  Injury with Fall? 0 0 0 - -  Follow up Falls prevention discussed Falls evaluation completed - - -   FALL RISK PREVENTION PERTAINING TO THE HOME:  Any stairs in or around the home? Yes  If so, do they handrails? Yes   Home free of loose throw rugs in walkways, pet beds, electrical cords, etc? Yes  Adequate lighting in your home to reduce risk of falls? Yes   ASSISTIVE DEVICES UTILIZED TO PREVENT FALLS:  Life alert? No  Use of a cane, walker or w/c? No  Grab bars in the bathroom? Yes  Shower chair or bench in shower? Yes  Elevated toilet seat or a handicapped toilet? No  DME ORDERS:  DME order needed?  No   TIMED UP AND GO:  Was the test performed? No . Telephonic visit.   Education: Fall risk prevention has been discussed.  Intervention(s) required? No    Depression Screen PHQ 2/9 Scores 06/07/2019 03/28/2019 03/04/2019 11/10/2018  PHQ - 2 Score 0 0 0 0  PHQ- 9 Score - 0 0 0    Cognitive Function - unable to assess due to cognitive status        Immunization History  Administered Date(s) Administered  . Influenza, Seasonal, Injecte, Preservative Fre 05/30/2010, 05/28/2011  . Influenza,inj,Quad PF,6+ Mos 04/13/2013, 04/24/2014,  03/14/2015, 06/13/2016, 05/20/2017, 03/26/2018, 06/04/2018, 04/20/2019  . Pneumococcal Conjugate-13 05/25/2017  . Pneumococcal Polysaccharide-23 03/31/2012, 03/17/2016  . Tdap 03/17/2016, 10/05/2016, 08/13/2017    Qualifies for Shingles Vaccine? Yes  . Due for Shingrix. Education has been provided regarding the importance of this vaccine. Pt has been advised to call insurance company to determine out of pocket expense. Advised may also receive vaccine at local pharmacy or Health Dept.   Tdap: Up to date  Flu Vaccine: Up to date  Pneumococcal Vaccine: Up to date   Screening Tests Health Maintenance  Topic Date Due  . FOOT EXAM  01/22/2019  . HIV Screening  07/28/2019 (Originally 10/17/1975)  . OPHTHALMOLOGY EXAM  07/23/2019  . HEMOGLOBIN A1C  09/28/2019  . Fecal DNA (Cologuard)  05/26/2021  . TETANUS/TDAP  08/14/2027  . INFLUENZA VACCINE  Completed  . PNEUMOCOCCAL POLYSACCHARIDE VACCINE AGE 59-64 HIGH RISK  Completed  . Hepatitis C Screening  Completed   Cancer Screenings:  Colorectal Screening: Cologuard completed 05/26/18. Repeat every 3 years;   Lung Cancer Screening: (Low Dose CT Chest recommended if Age 70-80 years, 30 pack-year currently smoking OR have quit w/in 15years.) does not qualify.    Additional Screening:  Hepatitis C Screening: does qualify; Completed 01/01/16  Vision Screening: Recommended annual ophthalmology exams for early detection of glaucoma and other disorders of the eye. Is the patient up to date with their annual eye exam?  Yes  Who is the provider or what is the name of the office in which the pt  attends annual eye exams? [place] Screening: Recommended annual dental exams for proper oral hygiene  Community Resource Referral:  CRR required this visit?  No       Plan:    I have personally reviewed and addressed the Medicare Annual Wellness questionnaire and have noted the following in the patient's chart:  A. Medical and  social history B. Use of alcohol, tobacco or illicit drugs  C. Current medications and supplements D. Functional ability and status E.  Nutritional status F.  Physical activity G. Advance directives H. List of other physicians I.  Hospitalizations, surgeries, and ER visits in previous 12 months J.  [place] such as hearing and vision if needed, cognitive and depression L. Referrals and appointments   In addition, I have reviewed and discussed with patient certain preventive protocols, quality metrics, and best practice recommendations. A written personalized care plan for preventive services as well as general preventive health recommendations were provided to patient.   Signed,  [name], LPN Nurse Health Advisor   Nurse Notes: none

## 2019-06-07 NOTE — Patient Instructions (Signed)
Wayne Jones , Thank you for taking time to come for your Medicare Wellness Visit. I appreciate your ongoing commitment to your health goals. Please review the following plan we discussed and let me know if I can assist you in the future.   Screening recommendations/referrals: Colonoscopy: Cologuard completed 05/26/18. Repeat in 2022. Recommended yearly ophthalmology/optometry visit for glaucoma screening and checkup Recommended yearly dental visit for hygiene and checkup  Vaccinations: Influenza vaccine: done 04/20/19 Pneumococcal vaccine: done 05/25/17 Tdap vaccine: done 08/13/17 Shingles vaccine: Shingrix discussed. Please contact your pharmacy for coverage information.   Conditions/risks identified: Recommend increasing physical activity.   Next appointment: Please follow up in one year for your Medicare Annual Wellness visit.    Preventive Care 40-64 Years, Male Preventive care refers to lifestyle choices and visits with your health care provider that can promote health and wellness. What does preventive care include?  A yearly physical exam. This is also called an annual well check.  Dental exams once or twice a year.  Routine eye exams. Ask your health care provider how often you should have your eyes checked.  Personal lifestyle choices, including:  Daily care of your teeth and gums.  Regular physical activity.  Eating a healthy diet.  Avoiding tobacco and drug use.  Limiting alcohol use.  Practicing safe sex.  Taking low-dose aspirin every day starting at age 1. What happens during an annual well check? The services and screenings done by your health care provider during your annual well check will depend on your age, overall health, lifestyle risk factors, and family history of disease. Counseling  Your health care provider may ask you questions about your:  Alcohol use.  Tobacco use.  Drug use.  Emotional well-being.  Home and relationship well-being.   Sexual activity.  Eating habits.  Work and work Statistician. Screening  You may have the following tests or measurements:  Height, weight, and BMI.  Blood pressure.  Lipid and cholesterol levels. These may be checked every 5 years, or more frequently if you are over 61 years old.  Skin check.  Lung cancer screening. You may have this screening every year starting at age 90 if you have a 30-pack-year history of smoking and currently smoke or have quit within the past 15 years.  Fecal occult blood test (FOBT) of the stool. You may have this test every year starting at age 17.  Flexible sigmoidoscopy or colonoscopy. You may have a sigmoidoscopy every 5 years or a colonoscopy every 10 years starting at age 24.  Prostate cancer screening. Recommendations will vary depending on your family history and other risks.  Hepatitis C blood test.  Hepatitis B blood test.  Sexually transmitted disease (STD) testing.  Diabetes screening. This is done by checking your blood sugar (glucose) after you have not eaten for a while (fasting). You may have this done every 1-3 years. Discuss your test results, treatment options, and if necessary, the need for more tests with your health care provider. Vaccines  Your health care provider may recommend certain vaccines, such as:  Influenza vaccine. This is recommended every year.  Tetanus, diphtheria, and acellular pertussis (Tdap, Td) vaccine. You may need a Td booster every 10 years.  Zoster vaccine. You may need this after age 8.  Pneumococcal 13-valent conjugate (PCV13) vaccine. You may need this if you have certain conditions and have not been vaccinated.  Pneumococcal polysaccharide (PPSV23) vaccine. You may need one or two doses if you smoke cigarettes or if you  have certain conditions. Talk to your health care provider about which screenings and vaccines you need and how often you need them. This information is not intended to replace  advice given to you by your health care provider. Make sure you discuss any questions you have with your health care provider. Document Released: 08/17/2015 Document Revised: 04/09/2016 Document Reviewed: 05/22/2015 Elsevier Interactive Patient Education  2017 [place] Prevention in the Home Falls can cause injuries. They can happen to people of all ages. There are many things you can do to make your home safe and to help prevent falls. What can I do on the outside of my home?  Regularly fix the edges of walkways and driveways and fix any cracks.  Remove anything that might make you trip as you walk through a door, such as a raised step or threshold.  Trim any bushes or trees on the path to your home.  Use bright outdoor lighting.  Clear any walking paths of anything that might make someone trip, such as rocks or tools.  Regularly check to see if handrails are loose or broken. Make sure that both sides of any steps have handrails.  Any raised decks and porches should have guardrails on the edges.  Have any leaves, snow, or ice cleared regularly.  Use sand or salt on walking paths during winter.  Clean up any spills in your garage right away. This includes oil or grease spills. What can I do in the bathroom?  Use night lights.  Install grab bars by the toilet and in the tub and shower. Do not use towel bars as grab bars.  Use non-skid mats or decals in the tub or shower.  If you need to sit down in the shower, use a plastic, non-slip stool.  Keep the floor dry. Clean up any water that spills on the floor as soon as it happens.  Remove soap buildup in the tub or shower regularly.  Attach bath mats securely with double-sided non-slip rug tape.  Do not have throw rugs and other things on the floor that can make you trip. What can I do in the bedroom?  Use night lights.  Make sure that you have a light by your bed that is easy to reach.  Do not use any sheets  or blankets that are too big for your bed. They should not hang down onto the floor.  Have a firm chair that has side arms. You can use this for support while you get dressed.  Do not have throw rugs and other things on the floor that can make you trip. What can I do in the kitchen?  Clean up any spills right away.  Avoid walking on wet floors.  Keep items that you use a lot in easy-to-reach places.  If you need to reach something above you, use a strong step stool that has a grab bar.  Keep electrical cords out of the way.  Do not use floor polish or wax that makes floors slippery. If you must use wax, use non-skid floor wax.  Do not have throw rugs and other things on the floor that can make you trip. What can I do with my stairs?  Do not leave any items on the stairs.  Make sure that there are handrails on both sides of the stairs and use them. Fix handrails that are broken or loose. Make sure that handrails are as long as the stairways.  Check any carpeting  to make sure that it is firmly attached to the stairs. Fix any carpet that is loose or worn.  Avoid having throw rugs at the top or bottom of the stairs. If you do have throw rugs, attach them to the floor with carpet tape.  Make sure that you have a light switch at the top of the stairs and the bottom of the stairs. If you do not have them, ask someone to add them for you. What else can I do to help prevent falls?  Wear shoes that:  Do not have high heels.  Have rubber bottoms.  Are comfortable and fit you well.  Are closed at the toe. Do not wear sandals.  If you use a stepladder:  Make sure that it is fully opened. Do not climb a closed stepladder.  Make sure that both sides of the stepladder are locked into place.  Ask someone to hold it for you, if possible.  Clearly mark and make sure that you can see:  Any grab bars or handrails.  First and last steps.  Where the edge of each step is.  Use  tools that help you move around (mobility aids) if they are needed. These include:  Canes.  Walkers.  Scooters.  Crutches.  Turn on the lights when you go into a dark area. Replace any light bulbs as soon as they burn out.  Set up your furniture so you have a clear path. Avoid moving your furniture around.  If any of your floors are uneven, fix them.  If there are any pets around you, be aware of where they are.  Review your medicines with your doctor. Some medicines can make you feel dizzy. This can increase your chance of falling. Ask your doctor what other things that you can do to help prevent falls. This information is not intended to replace advice given to you by your health care provider. Make sure you discuss any questions you have with your health care provider. Document Released: 05/17/2009 Document Revised: 12/27/2015 Document Reviewed: 08/25/2014 Elsevier Interactive Patient Education  2017 [employer].

## 2019-07-22 ENCOUNTER — Encounter: Payer: Self-pay | Admitting: Family Medicine

## 2019-07-22 ENCOUNTER — Ambulatory Visit (INDEPENDENT_AMBULATORY_CARE_PROVIDER_SITE_OTHER): Payer: Medicare Other | Admitting: Family Medicine

## 2019-07-22 ENCOUNTER — Other Ambulatory Visit: Payer: Self-pay

## 2019-07-22 VITALS — BP 133/81 | Temp 97.8°F

## 2019-07-22 DIAGNOSIS — E782 Mixed hyperlipidemia: Secondary | ICD-10-CM

## 2019-07-22 DIAGNOSIS — D696 Thrombocytopenia, unspecified: Secondary | ICD-10-CM

## 2019-07-22 DIAGNOSIS — R350 Frequency of micturition: Secondary | ICD-10-CM

## 2019-07-22 DIAGNOSIS — I83209 Varicose veins of unspecified lower extremity with both ulcer of unspecified site and inflammation: Secondary | ICD-10-CM

## 2019-07-22 DIAGNOSIS — N401 Enlarged prostate with lower urinary tract symptoms: Secondary | ICD-10-CM

## 2019-07-22 DIAGNOSIS — I10 Essential (primary) hypertension: Secondary | ICD-10-CM

## 2019-07-22 DIAGNOSIS — N138 Other obstructive and reflux uropathy: Secondary | ICD-10-CM

## 2019-07-22 DIAGNOSIS — F3012 Manic episode without psychotic symptoms, moderate: Secondary | ICD-10-CM

## 2019-07-22 DIAGNOSIS — K219 Gastro-esophageal reflux disease without esophagitis: Secondary | ICD-10-CM

## 2019-07-22 DIAGNOSIS — L97909 Non-pressure chronic ulcer of unspecified part of unspecified lower leg with unspecified severity: Secondary | ICD-10-CM

## 2019-07-22 MED ORDER — OMEPRAZOLE 20 MG PO CPDR: DELAYED_RELEASE_CAPSULE | ORAL | 10 refills | Status: DC

## 2019-07-22 MED ORDER — TAMSULOSIN HCL 0.4 MG PO CAPS: 0.4000 mg | ORAL_CAPSULE | Freq: Every day | ORAL | 5 refills | Status: DC

## 2019-07-22 MED ORDER — ATORVASTATIN CALCIUM 10 MG PO TABS: 10.0000 mg | ORAL_TABLET | Freq: Every day | ORAL | 10 refills | Status: DC

## 2019-07-22 NOTE — Progress Notes (Signed)
Name: Wayne Jones   MRN: [id number]    DOB: 01-23-1961   Date:07/22/2019       Progress Note  Subjective  Chief Complaint  Chief Complaint  Patient presents with  . Diabetes    BS 127  . Hypertension  . Hyperlipidemia  . Manic Behavior    I connected with  [name]  on 07/22/19 at 10:20 AM EST by a video enabled telemedicine application and verified that I am speaking with the correct person using two identifiers.  I discussed the limitations of evaluation and management by telemedicine and the availability of in person appointments. The patient expressed understanding and agreed to proceed. Staff also discussed with the patient that there may be a patient responsible charge related to this service. 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 His hgbA1Cis normal and not on medication, just life style modification.Fasting glucose has been normal. He is on statin therapy and aspirin. No symptoms of hypoglycemia.  HTN: on beta-blocker and Ace, bp today is at goal, no chest pain and no recent episodes of palpitation   BPH: heused to seeDr. Jacqlyn Larsen, he still has nocturia, he still masturbates often and causes penile irritation, but not problems at this time  . Now under the care of Dr. Kirke Corin, he needs refill of flomax   Dyslipidemia: taking statin and Lovaza.Reviewed labs done in August and was at goal, recheck it yearly   MR: lives in a group, under [place] care, stable behavior, no wandering or aggression.He needs help taking medication and bathing, cannot self medicate.Unchanged  Bipolar disorder: taking medication, but behavior has been stable, does not seem to be depressed or manic. He sees psychiatrist, Dr. Tamera Punt, and since he lives at a group home he is compliant with  medications . He is on beta blockers also rx by psychiatrist   Edema Lower extremities: he has chronic lower extremity edema, doing better on compression stocking hoses. Swelling has been stable, caregiver is not sure when he saw Dr. Lucky Cowboy last   Bradycardia: he was seen in our office in July and a referral was placed to cardiologist, however he takes a beta-blocker and is asymptomatic we will continue to monitor for now.  Penile irritation: he is no longer masturbating all the time and seems to be doing better   Thrombocytopenia: we will recheck on his next visit, no episodes of bleeding.   Patient Active Problem List   Diagnosis Date Noted  . Varicose veins, lower extremity, with inflammation, ulcerated, unspecified laterality ([place]) 05/26/2017  . Bilateral lower extremity edema 04/14/2017  . MR (mental retardation) 03/17/2016  . Polypharmacy 01/10/2016  . Benign prostatic hypertrophy without urinary obstruction 01/09/2015  . Chronic kidney disease (CKD), stage I 01/09/2015  . Cognitive decline 01/09/2015  . Controlled type 2 diabetes mellitus with microalbuminuria ([place]) 01/09/2015  . Acid reflux 01/09/2015  . Hearing loss 01/09/2015  . HLD (hyperlipidemia) 01/09/2015  . Extreme obesity 01/09/2015  . Obstructive sleep apnea 01/09/2015  . Other specified causes of urethral stricture 01/09/2015  . Hernia of anterior abdominal wall 01/09/2015  . Incomplete bladder emptying 07/09/2012  . 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 Benign essential HTN 02/04/2007  . Bipolar I disorder, single manic episode, moderate ([place]) 02/04/2007    Past Surgical History:  Procedure Laterality Date  . LAPAROSCOPIC CHOLECYSTECTOMY    .  LAPAROSCOPY  12/03/2010  . VENTRAL HERNIA REPAIR      Family History  Family history unknown: Yes    Social History   Socioeconomic History  . Marital status: Single    Spouse name: Not on file  . Number of children: Not on file  . Years of education: Not on  file  . Highest education level: Not on file  Occupational History  . Occupation: disabled    Comment: Patient in care home  Tobacco Use  . Smoking status: Never Smoker  . Smokeless tobacco: Never Used  Substance and Sexual Activity  . Alcohol use: No    Alcohol/week: 0.0 standard drinks  . Drug use: No  . Sexual activity: Never  Other Topics Concern  . Not on file  Social History Narrative   [name] calls him twice weekly. Pt resides at [facility] group home at [employer]    Social Determinants of Health   Financial Resource Strain:   . Difficulty of Paying Living Expenses: Not on file  Food Insecurity:   . Worried About Charity fundraiser in the Last Year: Not on file  . Ran Out of Food in the Last Year: Not on file  Transportation Needs:   . Lack of Transportation (Medical): Not on file  . Lack of Transportation (Non-Medical): Not on file  Physical Activity: Insufficiently Active  . Days of Exercise per Week: 7 days  . Minutes of Exercise per Session: 10 min  Stress:   . Feeling of Stress : Not on file  Social Connections: Unknown  . Frequency of Communication with Friends and Family: Patient refused  . Frequency of Social Gatherings with Friends and Family: Patient refused  . Attends Religious Services: Never  . Active Member of Clubs or Organizations: No  . Attends Archivist Meetings: Never  . Marital Status: Never married  Intimate Partner Violence: Not At Risk  . Fear of Current or Ex-Partner: No  . Emotionally Abused: No  . Physically Abused: No  . Sexually Abused: No     Current Outpatient Medications:  .  Alcohol Swabs (B-D SINGLE USE SWABS REGULAR) PADS, FINGERSTICK BLOOD SUGAR TEST ONCE DAILY IN THE MORNING PRIOR TO BREAKFAST. GLUCOSE GOAL 90-140 FASTING. IF>400 CALL MD, Disp: 100 each, Rfl: PRN .  ARIPiprazole (ABILIFY) 20 MG tablet, Take 20 mg by mouth daily., Disp: , Rfl:  .  ASPIRIN ADULT LOW STRENGTH 81 MG EC tablet, TAKE ONE TABLET  BY MOUTH EVERY DAY, Disp: 28 tablet, Rfl: 11 .  ASSURE LANCE LANCETS MISC, 1 each by Does not apply route daily. Fingerstick blood sugar test once daily in the morning prior to breakfast. Glucose Goal: 90-140 Fasting. If >400 call MD Controlled type 2 diabetes mellitus with microalbuminuria, without long-term current use of insulin (HCC)  Dx: E11.29, Disp: 200 each, Rfl: 4 .  atorvastatin (LIPITOR) 10 MG tablet, TAKE ONE TABLET BY MOUTH EACH DAY. (IMPROVES CHOLESTEROL), Disp: 30 tablet, Rfl: 10 .  Blood Glucose Monitoring Suppl (TRUE METRIX METER) w/Device KIT, by Does not apply route., Disp: , Rfl:  .  carbamazepine (EQUETRO) 200 MG CP12 12 hr capsule, Take 1 capsule (200 mg total) by mouth 2 (two) times daily., Disp: 180 each, Rfl: 3 .  carbamide peroxide (DEBROX) 6.5 % OTIC solution, PLACE 5 DROPS INTO BOTH EARS TWO TIMES A DAY, Disp: 15 mL, Rfl: 0 .  Cholecalciferol (VITAMIN D3) 1.25 MG (50000 UT) CAPS, Take 1 capsule by mouth every 14 (  fourteen) days., Disp: , Rfl:  .  clonazePAM (KLONOPIN) 1 MG tablet, Take 1 mg by mouth daily., Disp: , Rfl:  .  Elastic Bandages & Supports (MEDICAL COMPRESSION SOCKS) MISC, 2 each by Does not apply route daily. Apply in am's and remove it at bedtime, Disp: 2 each, Rfl: 1 .  famotidine (PEPCID) 40 MG tablet, Take 1 tablet (40 mg total) by mouth daily., Disp: 90 tablet, Rfl: 0 .  finasteride (PROSCAR) 5 MG tablet, Take 5 mg by mouth daily., Disp: , Rfl:  .  FLUoxetine (PROZAC) 20 MG capsule, TAKE 1 CAPSULE BY MOUTH DAILY, Disp: 30 capsule, Rfl: 10 .  gabapentin (NEURONTIN) 600 MG tablet, Take 600 mg by mouth at bedtime., Disp: , Rfl:  .  ibuprofen (ADVIL,MOTRIN) 800 MG tablet, TAKE ONE TABLET BY MOUTH EVERY 8 HOURS AS NEEDED, Disp: 30 tablet, Rfl: 10 .  lisinopril (ZESTRIL) 10 MG tablet, Take 1 tablet (10 mg total) by mouth daily., Disp: 30 tablet, Rfl: 5 .  loratadine (CLARITIN) 10 MG tablet, TAKE (1) TABLET BY MOUTH DAILY FOR ALLERGY., Disp: 30 tablet, Rfl: 10 .   nystatin cream (MYCOSTATIN), APPLY TOPICALLY TWO TIMES A DAY, Disp: 30 g, Rfl: 0 .  omega-3 acid ethyl esters (LOVAZA) 1 g capsule, TAKE 1 CAPSULE BY MOUTH 2 TIMES DAILY, Disp: 62 capsule, Rfl: 10 .  omeprazole (PRILOSEC) 20 MG capsule, TAKE 1 CAPSULE BY MOUTH ONCE A DAY.. (G.E.R.D.) DO NOT CRUSH, Disp: 30 capsule, Rfl: 10 .  ondansetron (ZOFRAN) 4 MG tablet, Take by mouth., Disp: , Rfl:  .  Polyethyl Glyc-Propyl Glyc PF (SYSTANE ULTRA PF) 0.4-0.3 % SOLN, Apply 1 each to eye daily., Disp: 30 each, Rfl: 11 .  propranolol ER (INDERAL LA) 60 MG 24 hr capsule, Take 1 capsule (60 mg total) by mouth daily. (Patient taking differently: Take 60 mg by mouth 2 (two) times daily. ), Disp: 90 capsule, Rfl: 3 .  tamsulosin (FLOMAX) 0.4 MG CAPS capsule, TAKE ONE CAPSULE BY MOUTH EVERY DAY AFTER SUPPER *DO NOT CRUSH*, Disp: 28 capsule, Rfl: 5 .  Vitamins A & D (VITAMIN A & D) ointment, Apply 1 application topically as needed for dry skin., Disp: 45 g, Rfl: 0  No Known Allergies  I personally reviewed active problem list, medication list, allergies, family history with the patient/caregiver today.   ROS  Ten systems reviewed and is negative except as mentioned in HPI   Objective  Virtual encounter, vitals  Obtained at group home  Vitals:   07/22/19 0925  BP: 133/81  Temp: 97.8 F (36.6 C)    There is no height or weight on file to calculate BMI.  Physical Exam  Awake, cooperative, caregiver did most of the talking   PHQ2/9: Depression screen [facility] 2/9 07/22/2019 06/07/2019 03/28/2019 03/04/2019 11/10/2018  Decreased Interest 0 0 0 0 0  Down, Depressed, Hopeless 0 0 0 0 0  PHQ - 2 Score 0 0 0 0 0  Altered sleeping 0 - 0 0 0  Tired, decreased energy 0 - 0 0 0  Change in appetite 0 - 0 0 0  Feeling bad or failure about yourself  0 - 0 0 0  Trouble concentrating 0 - 0 0 0  Moving slowly or fidgety/restless 0 - 0 0 0  Suicidal thoughts 0 - 0 0 0  PHQ-9 Score 0 - 0 0 0  Difficult doing  work/chores - - Not difficult at all Not difficult at all -   PHQ-2/9 Result  is negative.    Fall Risk: Fall Risk  07/22/2019 06/07/2019 03/04/2019 09/27/2018 06/04/2018  Falls in the past year? 0 0 0 0 0  Number falls in past yr: 0 0 0 - 0  Injury with Fall? 0 0 0 0 -  Follow up - Falls prevention discussed Falls evaluation completed - -     Assessment & Plan  1. Mixed hyperlipidemia  - atorvastatin (LIPITOR) 10 MG tablet; Take 1 tablet (10 mg total) by mouth daily.  Dispense: 30 tablet; Refill: 10  2. GERD without esophagitis  - omeprazole (PRILOSEC) 20 MG capsule; Take daily  Dispense: 30 capsule; Refill: 10  3. Urinary frequency  - tamsulosin (FLOMAX) 0.4 MG CAPS capsule; Take 1 capsule (0.4 mg total) by mouth daily.  Dispense: 28 capsule; Refill: 5  4. Benign essential HTN  At goal, off diuretic  5. Thrombocytopenia ([place])  Recheck next visit  6. Morbid obesity, unspecified obesity type [facility])  Discussed with the patient the risk posed by an increased BMI. Discussed importance of portion control, calorie counting and at least 150 minutes of physical activity weekly. Avoid sweet beverages and drink more water. Eat at least 6 servings of fruit and vegetables daily    7. Bipolar I disorder, single manic episode, moderate ([place])  Follow up with Dr. Tamera Punt  8. Varicose veins, lower extremity, with inflammation, ulcerated, unspecified laterality ([place])  Follow up with Dr. Lucky Cowboy  9. BPH with obstruction/lower urinary tract symptoms  - tamsulosin (FLOMAX) 0.4 MG CAPS capsule; Take 1 capsule (0.4 mg total) by mouth daily.  Dispense: 28 capsule; Refill: 5  I discussed the assessment and treatment plan with the patient. The patient was provided an opportunity to ask questions and all were answered. The patient agreed with the plan and demonstrated an understanding of the instructions.  The patient was advised to call back or seek an in-person evaluation if the symptoms worsen  or if the condition fails to improve as anticipated.  I provided 25  minutes of non-face-to-face time during this encounter.

## 2019-09-09 DIAGNOSIS — N4889 Other specified disorders of penis: Secondary | ICD-10-CM | POA: Diagnosis not present

## 2019-11-11 DIAGNOSIS — E119 Type 2 diabetes mellitus without complications: Secondary | ICD-10-CM | POA: Diagnosis not present

## 2019-11-11 LAB — HM DIABETES EYE EXAM

## 2019-11-14 ENCOUNTER — Encounter: Payer: Self-pay | Admitting: Family Medicine

## 2019-12-08 ENCOUNTER — Telehealth: Payer: Self-pay | Admitting: Family Medicine

## 2019-12-08 NOTE — Chronic Care Management (AMB) (Signed)
Chronic Care Management   Note  12/08/2019 Name: [name] MRN: [id number] DOB: Mar 04, 1961  Wayne Jones is a 59 y.o. year old male who is a primary care patient of Steele Sizer, MD. I reached out to [name] by phone today in response to a referral sent by [name] health plan.     [name]' guardian Derick Megan Salon  was given information about Chronic Care Management services today including:  1. CCM service includes personalized support from designated clinical staff supervised by his physician, including individualized plan of care and coordination with other care providers 2. 24/7 contact phone numbers for assistance for urgent and routine care needs. 3. Service will only be billed when office clinical staff spend 20 minutes or more in a month to coordinate care. 4. Only one practitioner may furnish and bill the service in a calendar month. 5. The patient may stop CCM services at any time (effective at the end of the month) by phone call to the office staff. 6. The patient will be responsible for cost sharing (co-pay) of up to 20% of the service fee (after annual deductible is met).  Patient's guardian did not agree to enrollment in care management services and does not wish to consider at this time due to patient being in a group home.  Follow up plan: The patient's guardian Derick has been provided with contact information for the care management team and has been advised to call with any health related questions or concerns.   Noreene Larsson, [address], [place], [place] [address] Direct Dial: [phone] [email or web address] Website: [email or web address]

## 2020-02-14 NOTE — Progress Notes (Deleted)
Patient is a 59 year old male patient of Dr. Ancil Boozer Last seen in November 2020. Follows up today with complaints of penile pain   Wayne Jones is very pleasant 59 y.o.-year-old male who is seen in follow-up for the evaluation of "penis pain/rash." Hx of BPH/LUTS and urethral stricture last seen by me on 06/03/19. During that time PVR was noted to be 2cc and urine stream noted to be "fine". He was noted to also have vague complaint of "penis pain" in setting of vigorous/frequent masturbation; no rash/lesions noted. IPSS at that time 5/2.  In the interim presenting owing to report by staff member that he had "rash on his penis" and pain in that area. Doing well from BPH perspective. He denies fevers, chills, night sweats, hematuria, pyuria, dysuria, urinary retention, urinary urgency, urinary frequency, chest pain, shortness of breath, listlessness and New onset bleeding.  He was seen by urology in February 2021 with a similar concern.    Wayne Jones is very pleasant 59 y.o.-year-old male who is seen in follow-up for the evaluation of "penis pain/rash." Hx of BPH/LUTS and urethral stricture last seen by me on 06/03/19. During that time PVR was noted to be 2cc and urine stream noted to be "fine". He was noted to also have vague complaint of "penis pain" in setting of vigorous/frequent masturbation; no rash/lesions noted. IPSS at that time 5/2.   In the interim presenting owing to report by staff member that he had "rash on his penis" and pain in that area. Doing well from BPH perspective. He denies fevers, chills, night sweats, hematuria, pyuria, dysuria, urinary retention, urinary urgency, urinary frequency, chest pain, shortness of breath, listlessness and New onset bleeding The assessment and plan was as follows:  BPH/LUTS on flomax/finasteride Hx urethral stricture  This is a 59 y.o.-year-old male with a history of BPH/LUTS who returns in follow-up for bothersome lower urinary tract symptoms  suggestive of symptomatic BPH. I reviewed with the patient and his caregiver today his urinary complaints, symptoms and evaluation in the clinic. He again complains of some vague intermittent penile pain again possible 2/2 vigorous/regular masturbation as there are no visible rashes on GU exam and he also endorses pain with slight palpation of abdomen extremities (unclear if this is true pain or automated response); furthermore he does not wince/retract on exam. We again discussed the importance of behavioral modifications including timed and double voiding, avoiding constipation episodes, shifting fluid intake earlier in the day to decrease nocturia at night. We discussed the utility of his medication therapy which includes finasteride/tamsulosin. The risk and side effects of the medications were again discussed. We again discussed further evaluation including a local cystoscopy procedure and transrectal ultrasound of the prostate for prostate volume to determine if more advance treatments may be useful. Will repeat UA to r/o infection. The patient will follow-up in approximately 12 months to reassess symptoms. We will be happy to see him back anytime sooner if symptoms arise.  Plan: [ ]  PSA q2-3 years [ ]  RTC any APP in 1 year [ ]  UA with reflex culture to r/o UTI given vague penile complaints; call group home first [phone]) and if no response will call caregiver Naoma Diener [phone] with results

## 2020-02-15 ENCOUNTER — Ambulatory Visit: Payer: Medicare Other | Admitting: Internal Medicine

## 2020-02-27 ENCOUNTER — Ambulatory Visit: Payer: Self-pay | Admitting: Family Medicine

## 2020-02-27 NOTE — Telephone Encounter (Signed)
MB is full  

## 2020-02-27 NOTE — Telephone Encounter (Signed)
Pt's caregiver calling, started triage as initially told pt was with caller, pt was not present. Pt resident at group home. Caller reports pt fell 1- 1/2 weeks ago in shower, unwitnessed. States has been limping on right leg in mornings "But gets better as day goes on." Also reports pain with urination per group home report to this caregiver. No other information regarding symptoms. Advised NT would need to speak to patient, states not able to do so, pt at work. States process in past has been for group home to call practice and report for pt. Advised NT would route to practice for advise. Triage incomplete.  Group home CB# [phone] Caregiver Mr. Kennis Carina  [phone]  Answer Assessment - Initial Assessment Questions 1. MECHANISM: "How did the fall happen?"     "Think he slipped in shower" 2. DOMESTIC VIOLENCE AND ELDER ABUSE SCREENING: "Did you fall because someone pushed you or tried to hurt you?" If Yes, ask: "Are you safe now?"     *No Answer* 3. ONSET: "When did the fall happen?" (e.g., minutes, hours, or days ago)     *1 -1/2 weeks ago 4. LOCATION: "What part of the body hit the ground?" (e.g., back, buttocks, head, hips, knees, hands, head, stomach)     Unwitnessed 5. INJURY: "Did you hurt (injure) yourself when you fell?" If Yes, ask: "What did you injure? Tell me more about this?" (e.g., body area; type of injury; pain severity)"    Limping right leg, pain with urination started at same time. 6. PAIN: "Is there any pain?" If Yes, ask: "How bad is the pain?" (e.g., Scale 1-10; or mild,  moderate, severe)   - NONE (0): no pain   - MILD (1-3): doesn't interfere with normal activities    - MODERATE (4-7): interferes with normal activities or awakens from sleep    - SEVERE (8-10): excruciating pain, unable to do any normal activities       7. SIZE: For cuts, bruises, or swelling, ask: "How large is it?" (e.g., inches or centimeters)      *No Answer* 8. PREGNANCY: "Is there any  chance you are pregnant?" "When was your last menstrual period?"     *No Answer* 9. OTHER SYMPTOMS: "Do you have any other symptoms?" (e.g., dizziness, fever, weakness; new onset or worsening).      *No Answer* 10. CAUSE: "What do you think caused the fall (or falling)?" (e.g., tripped, dizzy spell)       Unsure  Protocols used: FALLS AND FALLING-A-AH

## 2020-03-05 ENCOUNTER — Other Ambulatory Visit: Payer: Self-pay

## 2020-03-05 ENCOUNTER — Encounter: Payer: Self-pay | Admitting: Family Medicine

## 2020-03-05 ENCOUNTER — Ambulatory Visit (INDEPENDENT_AMBULATORY_CARE_PROVIDER_SITE_OTHER): Payer: Medicare Other | Admitting: Family Medicine

## 2020-03-05 ENCOUNTER — Ambulatory Visit: Admission: RE | Admit: 2020-03-05 | Payer: Self-pay | Source: Home / Self Care | Admitting: *Deleted

## 2020-03-05 VITALS — BP 120/80 | HR 59 | Temp 97.3°F | Resp 16 | Ht 74.0 in | Wt 252.9 lb

## 2020-03-05 DIAGNOSIS — F3012 Manic episode without psychotic symptoms, moderate: Secondary | ICD-10-CM

## 2020-03-05 DIAGNOSIS — I10 Essential (primary) hypertension: Secondary | ICD-10-CM

## 2020-03-05 DIAGNOSIS — M25561 Pain in right knee: Secondary | ICD-10-CM | POA: Diagnosis not present

## 2020-03-05 DIAGNOSIS — N401 Enlarged prostate with lower urinary tract symptoms: Secondary | ICD-10-CM

## 2020-03-05 DIAGNOSIS — B351 Tinea unguium: Secondary | ICD-10-CM

## 2020-03-05 DIAGNOSIS — E1129 Type 2 diabetes mellitus with other diabetic kidney complication: Secondary | ICD-10-CM | POA: Diagnosis not present

## 2020-03-05 DIAGNOSIS — R21 Rash and other nonspecific skin eruption: Secondary | ICD-10-CM

## 2020-03-05 DIAGNOSIS — D696 Thrombocytopenia, unspecified: Secondary | ICD-10-CM

## 2020-03-05 DIAGNOSIS — R809 Proteinuria, unspecified: Secondary | ICD-10-CM

## 2020-03-05 DIAGNOSIS — L304 Erythema intertrigo: Secondary | ICD-10-CM | POA: Diagnosis not present

## 2020-03-05 DIAGNOSIS — R300 Dysuria: Secondary | ICD-10-CM

## 2020-03-05 DIAGNOSIS — E782 Mixed hyperlipidemia: Secondary | ICD-10-CM | POA: Diagnosis not present

## 2020-03-05 DIAGNOSIS — Z9181 History of falling: Secondary | ICD-10-CM | POA: Diagnosis not present

## 2020-03-05 DIAGNOSIS — K219 Gastro-esophageal reflux disease without esophagitis: Secondary | ICD-10-CM

## 2020-03-05 DIAGNOSIS — N138 Other obstructive and reflux uropathy: Secondary | ICD-10-CM

## 2020-03-05 DIAGNOSIS — M1711 Unilateral primary osteoarthritis, right knee: Secondary | ICD-10-CM | POA: Diagnosis not present

## 2020-03-05 LAB — POCT GLYCOSYLATED HEMOGLOBIN (HGB A1C): Hemoglobin A1C: 4.9 % (ref 4.0–5.6)

## 2020-03-05 LAB — POCT URINALYSIS DIPSTICK
Appearance: NORMAL
Bilirubin, UA: NEGATIVE
Blood, UA: POSITIVE
Glucose, UA: NEGATIVE
Ketones, UA: NEGATIVE
Leukocytes, UA: NEGATIVE
Nitrite, UA: NEGATIVE
Odor: ABNORMAL
Protein, UA: NEGATIVE
Spec Grav, UA: 1.01 (ref 1.010–1.025)
Urobilinogen, UA: 0.2 E.U./dL
pH, UA: 7 (ref 5.0–8.0)

## 2020-03-05 MED ORDER — KETOCONAZOLE 2 % EX CREA: 1.0000 "application " | TOPICAL_CREAM | Freq: Every day | CUTANEOUS | 1 refills | Status: DC

## 2020-03-05 NOTE — Progress Notes (Signed)
Name: Wayne Jones   MRN: [id number]    DOB: May 16, 1961   Date:03/05/2020       Progress Note  Subjective  Chief Complaint  Chief Complaint  Patient presents with  . Fall    Caregiver reportst that patient fell and injured his right leg and has been limping.  . Leg Pain  . Rash    He has a rash around his chest and groin area.  . Dysuria    Complains of painful urinartion x 12 days.    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 His hgbA1Cis normal and not on medication, just life style modification.He is on statin therapy and aspirin. No symptoms of hypoglycemia.Unchanged Due for foot exam, he sees podiatrist   Recent Fall : he fell at the group home a couple of weeks ago, he was not seen by a physician , since the fall hehas been complaining of right knee pain and having antalgic gait. The fall was not witnessed, happened in a room at the group home  Dysuria: lives in a group home, came in [place], not exact sure when symptoms started, complaining of discomfort, not sure about odor, no fever or chills. Patient has MR and difficulty to obtain history . He has a history of masturbating , we will wait for culture results   HTN: on beta-blocker and Ace, bp today is at goal, no chest pain and no recent episodes of palpitation Heart rate always low   BPH: heused to seeDr. Jacqlyn Larsen, he still has nocturia,he still masturbates often and causes penile irritation, but not problems at this time . Now under the care of Dr. Shirlean Kelly  , stable   Dyslipidemia: taking statin and Lovaza.Reviewed labs done last year and we will recheck it today   MR: lives in a group, under [employer] care, stable behavior, no wandering or aggression.He is a new facility, under [employer], because the previous facility shut down  Bipolar disorder: taking medication, but behavior has been stable, does not seem to be depressed or manic. He sees psychiatrist, Dr. Tamera Punt, and  since he lives at a group home he is compliant with medications. He is on beta blockers also rx by psychiatrist - unchanged   Edema Lower extremities: he has chronic lower extremity edema, doing better on compression stocking hoses.Swelling has been stable.   Bradycardia: he was seen in our office in July and a referral was placed to cardiologist - he never went to see them, however he takes a beta-blocker and is asymptomatic we will continue to monitor for now.  Intertrigo: groin, advised to dry it well, we will start topical medication   Thrombocytopenia: we will recheck today, it has been low for years and stable.   Patient Active Problem List   Diagnosis Date Noted  . Varicose veins, lower extremity, with inflammation, ulcerated, unspecified laterality ([place]) 05/26/2017  . Bilateral lower extremity edema 04/14/2017  . MR (mental retardation) 03/17/2016  . Polypharmacy 01/10/2016  . Benign prostatic hypertrophy without urinary obstruction 01/09/2015  . Chronic kidney disease (CKD), stage I 01/09/2015  . Cognitive decline 01/09/2015  . Controlled type 2 diabetes mellitus with microalbuminuria ([place]) 01/09/2015  . Acid reflux 01/09/2015  . Hearing loss 01/09/2015  . HLD (hyperlipidemia) 01/09/2015  . Extreme obesity 01/09/2015  . Obstructive sleep apnea 01/09/2015  . Other specified causes of urethral stricture 01/09/2015  . Hernia of anterior abdominal wall 01/09/2015  . Incomplete bladder emptying 07/09/2012  .  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 Benign essential HTN 02/04/2007  . Bipolar I disorder, single manic episode, moderate ([place]) 02/04/2007    Past Surgical History:  Procedure Laterality Date  . LAPAROSCOPIC CHOLECYSTECTOMY    . LAPAROSCOPY  12/03/2010  . VENTRAL HERNIA REPAIR      Family History  Family history unknown: Yes    Social History   Tobacco Use  . Smoking status: Never Smoker  . Smokeless tobacco: Never Used  Substance Use Topics  . Alcohol  use: No    Alcohol/week: 0.0 standard drinks     Current Outpatient Medications:  .  Alcohol Swabs (B-D SINGLE USE SWABS REGULAR) PADS, FINGERSTICK BLOOD SUGAR TEST ONCE DAILY IN THE MORNING PRIOR TO BREAKFAST. GLUCOSE GOAL 90-140 FASTING. IF>400 CALL MD, Disp: 100 each, Rfl: PRN .  ARIPiprazole (ABILIFY) 20 MG tablet, Take 20 mg by mouth daily., Disp: , Rfl:  .  ASPIRIN ADULT LOW STRENGTH 81 MG EC tablet, TAKE ONE TABLET BY MOUTH EVERY DAY, Disp: 28 tablet, Rfl: 11 .  ASSURE LANCE LANCETS MISC, 1 each by Does not apply route daily. Fingerstick blood sugar test once daily in the morning prior to breakfast. Glucose Goal: 90-140 Fasting. If >400 call MD Controlled type 2 diabetes mellitus with microalbuminuria, without long-term current use of insulin (HCC)  Dx: E11.29, Disp: 200 each, Rfl: 4 .  atorvastatin (LIPITOR) 10 MG tablet, Take 1 tablet (10 mg total) by mouth daily., Disp: 30 tablet, Rfl: 10 .  Blood Glucose Monitoring Suppl (TRUE METRIX METER) w/Device KIT, by Does not apply route., Disp: , Rfl:  .  carbamazepine (EQUETRO) 200 MG CP12 12 hr capsule, Take 1 capsule (200 mg total) by mouth 2 (two) times daily., Disp: 180 each, Rfl: 3 .  carbamide peroxide (DEBROX) 6.5 % OTIC solution, PLACE 5 DROPS INTO BOTH EARS TWO TIMES A DAY, Disp: 15 mL, Rfl: 0 .  Cholecalciferol (VITAMIN D3) 1.25 MG (50000 UT) CAPS, Take 1 capsule by mouth every 14 (fourteen) days., Disp: , Rfl:  .  clonazePAM (KLONOPIN) 1 MG tablet, Take 1 mg by mouth daily., Disp: , Rfl:  .  Elastic Bandages & Supports (MEDICAL COMPRESSION SOCKS) MISC, 2 each by Does not apply route daily. Apply in am's and remove it at bedtime, Disp: 2 each, Rfl: 1 .  famotidine (PEPCID) 40 MG tablet, Take 1 tablet (40 mg total) by mouth daily., Disp: 90 tablet, Rfl: 0 .  finasteride (PROSCAR) 5 MG tablet, Take 5 mg by mouth daily., Disp: , Rfl:  .  FLUoxetine (PROZAC) 20 MG capsule, TAKE 1 CAPSULE BY MOUTH DAILY, Disp: 30 capsule, Rfl: 10 .   gabapentin (NEURONTIN) 600 MG tablet, Take 600 mg by mouth at bedtime., Disp: , Rfl:  .  ibuprofen (ADVIL,MOTRIN) 800 MG tablet, TAKE ONE TABLET BY MOUTH EVERY 8 HOURS AS NEEDED, Disp: 30 tablet, Rfl: 10 .  lisinopril (ZESTRIL) 10 MG tablet, Take 1 tablet (10 mg total) by mouth daily., Disp: 30 tablet, Rfl: 5 .  loratadine (CLARITIN) 10 MG tablet, TAKE (1) TABLET BY MOUTH DAILY FOR ALLERGY., Disp: 30 tablet, Rfl: 10 .  nystatin cream (MYCOSTATIN), APPLY TOPICALLY TWO TIMES A DAY, Disp: 30 g, Rfl: 0 .  omega-3 acid ethyl esters (LOVAZA) 1 g capsule, TAKE 1 CAPSULE BY MOUTH 2 TIMES DAILY, Disp: 62 capsule, Rfl: 10 .  omeprazole (PRILOSEC) 20 MG capsule, Take daily, Disp: 30 capsule, Rfl: 10 .  ondansetron (ZOFRAN) 4 MG tablet, Take by mouth., Disp: , Rfl:  .  Polyethyl Glyc-Propyl Glyc PF (SYSTANE ULTRA PF) 0.4-0.3 % SOLN, Apply 1 each to eye daily., Disp: 30 each, Rfl: 11 .  propranolol ER (INDERAL LA) 60 MG 24 hr capsule, Take 1 capsule (60 mg total) by mouth daily. (Patient taking differently: Take 60 mg by mouth 2 (two) times daily. ), Disp: 90 capsule, Rfl: 3 .  tamsulosin (FLOMAX) 0.4 MG CAPS capsule, Take 1 capsule (0.4 mg total) by mouth daily., Disp: 28 capsule, Rfl: 5 .  Vitamins A & D (VITAMIN A & D) ointment, Apply 1 application topically as needed for dry skin., Disp: 45 g, Rfl: 0  No Known Allergies  I personally reviewed active problem list, medication list, allergies, family history, social history with the patient/caregiver today.   ROS  Ten systems reviewed and is negative except as mentioned in HPI : poor historian   Objective  Vitals:   03/05/20 1214  BP: 120/80  Pulse: (!) 59  Resp: 16  Temp: (!) 97.3 F (36.3 C)  TempSrc: Temporal  SpO2: 100%  Weight: 252 lb 14.4 oz (114.7 kg)  Height: 6' 2" (1.88 m)    Body mass index is 32.47 kg/m.  Physical Exam  Constitutional: Patient appears well-developed and well-nourished. Obese  No distress.  HEENT: head  atraumatic, normocephalic, pupils equal and reactive to light,neck supple Cardiovascular: Normal rate, regular rhythm and normal heart sounds.  No murmur heard. 1 plus  BLE edema. Pulmonary/Chest: Effort normal and breath sounds normal. No respiratory distress. Abdominal: Soft.  There is no tenderness. Psychiatric: Patient has a flat affect, cooperative Muscular skeletal: right knee effusion, no redness or increase in warmth, decrease rom   Recent Results (from the past 2160 hour(s))  POCT Urinalysis Dipstick     Status: Abnormal   Collection Time: 03/05/20 12:23 PM  Result Value Ref Range   Color, UA Yellow    Clarity, UA Clear    Glucose, UA Negative Negative   Bilirubin, UA Negative    Ketones, UA Negative    Spec Grav, UA 1.010 1.010 - 1.025   Blood, UA Positive     Comment: Trace   pH, UA 7.0 5.0 - 8.0   Protein, UA Negative Negative   Urobilinogen, UA 0.2 0.2 or 1.0 E.U./dL   Nitrite, UA Negative    Leukocytes, UA Negative Negative   Appearance Normal    Odor Abnormal   POCT HgB A1C     Status: Normal   Collection Time: 03/05/20 12:28 PM  Result Value Ref Range   Hemoglobin A1C 4.9 4.0 - 5.6 %   HbA1c POC (<> result, manual entry)     HbA1c, POC (prediabetic range)     HbA1c, POC (controlled diabetic range)        PHQ2/9: Depression screen [facility] 2/9 03/05/2020 07/22/2019 06/07/2019 03/28/2019 03/04/2019  Decreased Interest 0 0 0 0 0  Down, Depressed, Hopeless 0 0 0 0 0  PHQ - 2 Score 0 0 0 0 0  Altered sleeping 0 0 - 0 0  Tired, decreased energy 0 0 - 0 0  Change in appetite 0 0 - 0 0  Feeling bad or failure about yourself  0 0 - 0 0  Trouble concentrating 0 0 - 0 0  Moving slowly or fidgety/restless 0 0 - 0 0  Suicidal thoughts 0 0 - 0 0  PHQ-9 Score 0 0 - 0 0  Difficult doing work/chores - - - Not difficult at all Not difficult at all  phq 9 is negative   Fall Risk: Fall Risk  03/05/2020 07/22/2019 06/07/2019 03/04/2019 09/27/2018  Falls in the past year? 0 0 0  0 0  Number falls in past yr: 0 0 0 0 -  Injury with Fall? 0 0 0 0 0  Follow up - - Falls prevention discussed Falls evaluation completed -     Assessment & Plan  1. Controlled type 2 diabetes mellitus with microalbuminuria, without long-term current use of insulin (HCC)  - POCT HgB A1C - COMPLETE METABOLIC PANEL WITH GFR - Ambulatory referral to Podiatry  2. Dysuria  - POCT Urinalysis Dipstick - CULTURE, URINE COMPREHENSIVE  3. Benign essential HTN  - CBC with Differential/Platelet - COMPLETE METABOLIC PANEL WITH GFR  4. BPH with obstruction/lower urinary tract symptoms   5. GERD without esophagitis   6. Mixed hyperlipidemia  - Lipid panel  7. Bipolar I disorder, single manic episode, moderate (HCC)  Under the care of Dr. Tamera Punt and stable  8. Rash  On anterior chest looks like two acne spots and one area of excoriation, groin looks like intertrigo  9. Intertrigo  - ketoconazole (NIZORAL) 2 % cream; Apply 1 application topically daily. Groin rash  Dispense: 60 g; Refill: 1  10. History of recent fall  Referral to PT and consider in home PT   11. Acute pain of right knee  - Ambulatory referral to Orthopedic Surgery  12. Dermatophytic onychia  - Ambulatory referral to Podiatry  13. Thrombocytopenia ([place])  Recheck CBC

## 2020-03-06 DIAGNOSIS — R809 Proteinuria, unspecified: Secondary | ICD-10-CM | POA: Diagnosis not present

## 2020-03-06 DIAGNOSIS — I10 Essential (primary) hypertension: Secondary | ICD-10-CM | POA: Diagnosis not present

## 2020-03-06 DIAGNOSIS — E782 Mixed hyperlipidemia: Secondary | ICD-10-CM | POA: Diagnosis not present

## 2020-03-06 DIAGNOSIS — R300 Dysuria: Secondary | ICD-10-CM | POA: Diagnosis not present

## 2020-03-06 DIAGNOSIS — E1129 Type 2 diabetes mellitus with other diabetic kidney complication: Secondary | ICD-10-CM | POA: Diagnosis not present

## 2020-03-08 LAB — CBC WITH DIFFERENTIAL/PLATELET
Absolute Monocytes: 365 cells/uL (ref 200–950)
Basophils Absolute: 19 cells/uL (ref 0–200)
Basophils Relative: 0.4 %
Eosinophils Absolute: 110 cells/uL (ref 15–500)
Eosinophils Relative: 2.3 %
HCT: 40.3 % (ref 38.5–50.0)
Hemoglobin: 14 g/dL (ref 13.2–17.1)
Lymphs Abs: 1080 cells/uL (ref 850–3900)
MCH: 32.6 pg (ref 27.0–33.0)
MCHC: 34.7 g/dL (ref 32.0–36.0)
MCV: 93.7 fL (ref 80.0–100.0)
MPV: 11.2 fL (ref 7.5–12.5)
Monocytes Relative: 7.6 %
Neutro Abs: 3226 cells/uL (ref 1500–7800)
Neutrophils Relative %: 67.2 %
Platelets: 94 10*3/uL — ABNORMAL LOW (ref 140–400)
RBC: 4.3 10*6/uL (ref 4.20–5.80)
RDW: 12.7 % (ref 11.0–15.0)
Total Lymphocyte: 22.5 %
WBC: 4.8 10*3/uL (ref 3.8–10.8)

## 2020-03-08 LAB — COMPLETE METABOLIC PANEL WITH GFR
AG Ratio: 2 (calc) (ref 1.0–2.5)
ALT: 15 U/L (ref 9–46)
AST: 15 U/L (ref 10–35)
Albumin: 4.2 g/dL (ref 3.6–5.1)
Alkaline phosphatase (APISO): 76 U/L (ref 35–144)
BUN: 12 mg/dL (ref 7–25)
CO2: 28 mmol/L (ref 20–32)
Calcium: 8.8 mg/dL (ref 8.6–10.3)
Chloride: 101 mmol/L (ref 98–110)
Creat: 0.81 mg/dL (ref 0.70–1.33)
GFR, Est African American: 113 mL/min/{1.73_m2} (ref >=60)
GFR, Est Non African American: 97 mL/min/{1.73_m2} (ref >=60)
Globulin: 2.1 g/dL (calc) (ref 1.9–3.7)
Glucose, Bld: 99 mg/dL (ref 65–99)
Potassium: 4.3 mmol/L (ref 3.5–5.3)
Sodium: 137 mmol/L (ref 135–146)
Total Bilirubin: 0.8 mg/dL (ref 0.2–1.2)
Total Protein: 6.3 g/dL (ref 6.1–8.1)

## 2020-03-08 LAB — LIPID PANEL
Cholesterol: 114 mg/dL (ref <200)
HDL: 53 mg/dL (ref >=40)
LDL Cholesterol (Calc): 47 mg/dL (calc)
Non-HDL Cholesterol (Calc): 61 mg/dL (calc) (ref <130)
Total CHOL/HDL Ratio: 2.2 (calc) (ref <5.0)
Triglycerides: 65 mg/dL (ref <150)

## 2020-03-08 LAB — CULTURE, URINE COMPREHENSIVE
MICRO NUMBER:: 10784866
SPECIMEN QUALITY:: ADEQUATE

## 2020-03-11 ENCOUNTER — Other Ambulatory Visit: Payer: Self-pay | Admitting: Family Medicine

## 2020-03-11 DIAGNOSIS — N3000 Acute cystitis without hematuria: Secondary | ICD-10-CM

## 2020-03-11 MED ORDER — AMOXICILLIN 500 MG PO CAPS
500.0000 mg | ORAL_CAPSULE | Freq: Two times a day (BID) | ORAL | 0 refills | Status: DC
Start: 2020-03-11 — End: 2020-04-03

## 2020-03-13 ENCOUNTER — Emergency Department: Admission: EM | Admit: 2020-03-13 | Discharge: 2020-03-13 | Discharge status: Against Medical Advice | Payer: Self-pay

## 2020-03-13 DIAGNOSIS — F3489 Other specified persistent mood disorders: Secondary | ICD-10-CM | POA: Diagnosis not present

## 2020-03-13 NOTE — ED Triage Notes (Signed)
Pt caregiver states that she does not want to wait that they "will come back in the morning"

## 2020-03-14 ENCOUNTER — Ambulatory Visit: Payer: Self-pay

## 2020-03-14 ENCOUNTER — Encounter: Payer: Self-pay | Admitting: Internal Medicine

## 2020-03-14 ENCOUNTER — Other Ambulatory Visit: Payer: Self-pay

## 2020-03-14 ENCOUNTER — Ambulatory Visit (INDEPENDENT_AMBULATORY_CARE_PROVIDER_SITE_OTHER): Payer: Medicare Other | Admitting: Internal Medicine

## 2020-03-14 VITALS — BP 112/74 | HR 64 | Temp 97.6°F | Resp 16 | Ht 74.0 in | Wt 254.0 lb

## 2020-03-14 DIAGNOSIS — F79 Unspecified intellectual disabilities: Secondary | ICD-10-CM | POA: Diagnosis not present

## 2020-03-14 DIAGNOSIS — R4189 Other symptoms and signs involving cognitive functions and awareness: Secondary | ICD-10-CM | POA: Diagnosis not present

## 2020-03-14 DIAGNOSIS — F3012 Manic episode without psychotic symptoms, moderate: Secondary | ICD-10-CM | POA: Diagnosis not present

## 2020-03-14 DIAGNOSIS — R300 Dysuria: Secondary | ICD-10-CM | POA: Diagnosis not present

## 2020-03-14 DIAGNOSIS — R001 Bradycardia, unspecified: Secondary | ICD-10-CM

## 2020-03-14 DIAGNOSIS — N3000 Acute cystitis without hematuria: Secondary | ICD-10-CM | POA: Diagnosis not present

## 2020-03-14 DIAGNOSIS — I10 Essential (primary) hypertension: Secondary | ICD-10-CM

## 2020-03-14 NOTE — Telephone Encounter (Addendum)
Pt.'s caregiver from group home reports the nurse has noted low pulse and requests appointment. No chest pain or other symptoms. States pt. Still complaining o fgroin pain and "irritation." Currently on antibiotic for dysuria. Appointment made. No availability with his PCP.  Reason for Disposition  [1] Palpitations AND [2] no improvement after using CARE ADVICE  Answer Assessment - Initial Assessment Questions 1. DESCRIPTION: "Please describe your heart rate or heartbeat that you are having" (e.g., fast/slow, regular/irregular, skipped or extra beats, "palpitations")     47-58 2. ONSET: "When did it start?" (Minutes, hours or days)      Last week 3. DURATION: "How long does it last" (e.g., seconds, minutes, hours)     Constant 4. PATTERN "Does it come and go, or has it been constant since it started?"  "Does it get worse with exertion?"   "Are you feeling it now?"     Constant 5. TAP: "Using your hand, can you tap out what you are feeling on a chair or table in front of you, so that I can hear?" (Note: not all patients can do this)       No 6. HEART RATE: "Can you tell me your heart rate?" "How many beats in 15 seconds?"  (Note: not all patients can do this)       47-58 7. RECURRENT SYMPTOM: "Have you ever had this before?" If Yes, ask: "When was the last time?" and "What happened that time?"      Yes 8. CAUSE: "What do you think is causing the palpitations?"     No 9. CARDIAC HISTORY: "Do you have any history of heart disease?" (e.g., heart attack, angina, bypass surgery, angioplasty, arrhythmia)      No 10. OTHER SYMPTOMS: "Do you have any other symptoms?" (e.g., dizziness, chest pain, sweating, difficulty breathing)       Tired 11. PREGNANCY: "Is there any chance you are pregnant?" "When was your last menstrual period?"       n/a  Protocols used: HEART RATE AND HEARTBEAT QUESTIONS-A-AH

## 2020-03-14 NOTE — Patient Instructions (Addendum)
The patient's heart rate being in the 50s is due to a medication he is taking, and a beta-blocker. Dr. Ancil Boozer noted last visit that he has remained asymptomatic, and as long as he continues to do so, continuing to monitor is very appropriate presently.  I agree with this approach. He needs to continue the antibiotic for his urinary tract infection, and if symptoms have not resolved after completing, follow-up is recommended.

## 2020-03-14 NOTE — Progress Notes (Signed)
Patient ID: Wayne Jones, male    DOB: 12-Jun-1961, 59 y.o.   MRN: [id number]  PCP: Steele Sizer, MD  Chief Complaint  Patient presents with  . Low pulse    nursing staff at his home made him an appointment becuase his pulse was low running around 50 then 57    Subjective:   Wayne Jones is a 58 y.o. male, presents to clinic with CC of the following:  Chief Complaint  Patient presents with  . Low pulse    nursing staff at his home made him an appointment becuase his pulse was low running around 50 then 57    HPI:  Patient is a 59 year old male patient of Dr. Ancil Boozer Was just seen 03/05/2020 by her.  That note was reviewed. He follows up today. Patient came approximately 10 minutes late for his 20-minute appointment and was still seen He was with a staff member who helped with his history. Informed that the staff wanted him to be checked because his heart rate was low when checked, in the 50s.  He has a history of MR, lives in a group home, and obtaining history is very difficult He noted at that last visit visit some dysuria,  not exact sure when symptoms started, complaining of discomfort, not sure about odor, no fever or chills. He has a history of masturbating.  A urine was obtained, and not empirically treated.  The culture returned positive for strep, and on 8/8, Dr. Ancil Boozer noted she would send a prescription in for treatment.  Sent Amoxil 500 mg twice daily for 7 days.   Patient also is on a beta-blocker and an ACE inhibitor for hypertension.  It was noted last visit that his heart rate always is low.  He was seen in the office in July and a referral placed to a cardiologist at that time, although he never went to see them noted.  Dr. Ancil Boozer noted he is on a beta-blocker, is asymptomatic, and will continue to monitor.  Patient also has BPH with this noted on his last visit: Heused to seeDr. Jacqlyn Larsen, he still has nocturia,he still masturbates often and causes  penile irritation, but not problems at this time. Now under the care of Dr. Shirlean Kelly  , stable   Also has bipolar disorder with this noted on his last visit: taking medication, but behavior has been stable, does not seem to be depressed or manic. He sees psychiatrist, Dr. Tamera Punt, and since he lives at a group home he is compliant with medications. He is on beta blockers also rx by psychiatrist- unchanged   When asked if he was having any pain, the staff member noted he mostly notes pain in the genital area persisting, like the dysuria he was seen for by Dr. Ancil Boozer.  The antibiotic was just started recently, and should help those symptoms improved. He has not been complaining of chest pains, or shortness of breath.  Patient Active Problem List   Diagnosis Date Noted  . Varicose veins, lower extremity, with inflammation, ulcerated, unspecified laterality ([place]) 05/26/2017  . Bilateral lower extremity edema 04/14/2017  . Intellectual disability 03/17/2016  . Polypharmacy 01/10/2016  . Benign prostatic hypertrophy without urinary obstruction 01/09/2015  . Chronic kidney disease (CKD), stage I 01/09/2015  . Cognitive decline 01/09/2015  . Controlled type 2 diabetes mellitus with microalbuminuria ([place]) 01/09/2015  . Acid reflux 01/09/2015  . Hearing loss 01/09/2015  . HLD (hyperlipidemia) 01/09/2015  . Extreme obesity 01/09/2015  .  Obstructive sleep apnea 01/09/2015  . Other specified causes of urethral stricture 01/09/2015  . Hernia of anterior abdominal wall 01/09/2015  . Incomplete bladder emptying 07/09/2012  . 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 Benign essential HTN 02/04/2007  . Bipolar I disorder, single manic episode, moderate ([place]) 02/04/2007      Current Outpatient Medications:  .  Alcohol Swabs (B-D SINGLE USE SWABS REGULAR) PADS, FINGERSTICK BLOOD SUGAR TEST ONCE DAILY IN THE MORNING PRIOR TO BREAKFAST. GLUCOSE GOAL 90-140 FASTING. IF>400 CALL MD, Disp: 100 each, Rfl:  PRN .  amoxicillin (AMOXIL) 500 MG capsule, Take 1 capsule (500 mg total) by mouth 2 (two) times daily., Disp: 14 capsule, Rfl: 0 .  ARIPiprazole (ABILIFY) 20 MG tablet, Take 20 mg by mouth daily., Disp: , Rfl:  .  aspirin EC 81 MG tablet, Take 81 mg by mouth daily. Swallow whole., Disp: , Rfl:  .  ASSURE LANCE LANCETS MISC, 1 each by Does not apply route daily. Fingerstick blood sugar test once daily in the morning prior to breakfast. Glucose Goal: 90-140 Fasting. If >400 call MD Controlled type 2 diabetes mellitus with microalbuminuria, without long-term current use of insulin ([facility])  Dx: E11.29, Disp: 200 each, Rfl: 4 .  atorvastatin (LIPITOR) 10 MG tablet, Take 1 tablet (10 mg total) by mouth daily., Disp: 30 tablet, Rfl: 10 .  Blood Glucose Monitoring Suppl (TRUE METRIX METER) w/Device KIT, by Does not apply route., Disp: , Rfl:  .  carbamazepine (EQUETRO) 200 MG CP12 12 hr capsule, Take 1 capsule (200 mg total) by mouth 2 (two) times daily., Disp: 180 each, Rfl: 3 .  carbamide peroxide (DEBROX) 6.5 % OTIC solution, PLACE 5 DROPS INTO BOTH EARS TWO TIMES A DAY, Disp: 15 mL, Rfl: 0 .  Cholecalciferol (VITAMIN D3) 1.25 MG (50000 UT) CAPS, Take 1 capsule by mouth every 14 (fourteen) days., Disp: , Rfl:  .  clonazePAM (KLONOPIN) 1 MG tablet, Take 1 mg by mouth daily., Disp: , Rfl:  .  DENTA 5000 PLUS 1.1 % CREA dental cream, Take by mouth., Disp: , Rfl:  .  Elastic Bandages & Supports (MEDICAL COMPRESSION SOCKS) MISC, 2 each by Does not apply route daily. Apply in am's and remove it at bedtime, Disp: 2 each, Rfl: 1 .  famotidine (PEPCID) 40 MG tablet, Take 1 tablet (40 mg total) by mouth daily., Disp: 90 tablet, Rfl: 0 .  finasteride (PROSCAR) 5 MG tablet, Take 5 mg by mouth daily., Disp: , Rfl:  .  FLUoxetine (PROZAC) 20 MG capsule, TAKE 1 CAPSULE BY MOUTH DAILY, Disp: 30 capsule, Rfl: 10 .  gabapentin (NEURONTIN) 600 MG tablet, Take 600 mg by mouth at bedtime., Disp: , Rfl:  .  ibuprofen  (ADVIL,MOTRIN) 800 MG tablet, TAKE ONE TABLET BY MOUTH EVERY 8 HOURS AS NEEDED, Disp: 30 tablet, Rfl: 10 .  ketoconazole (NIZORAL) 2 % cream, Apply 1 application topically daily. Groin rash, Disp: 60 g, Rfl: 1 .  lisinopril (ZESTRIL) 10 MG tablet, Take 1 tablet (10 mg total) by mouth daily., Disp: 30 tablet, Rfl: 5 .  loratadine (CLARITIN) 10 MG tablet, TAKE (1) TABLET BY MOUTH DAILY FOR ALLERGY., Disp: 30 tablet, Rfl: 10 .  nystatin cream (MYCOSTATIN), APPLY TOPICALLY TWO TIMES A DAY, Disp: 30 g, Rfl: 0 .  omega-3 acid ethyl esters (LOVAZA) 1 g capsule, TAKE 1 CAPSULE BY MOUTH 2 TIMES DAILY, Disp: 62 capsule, Rfl: 10 .  omeprazole (PRILOSEC) 20 MG capsule, Take daily, Disp: 30 capsule, Rfl: 10 .  ondansetron (ZOFRAN) 4 MG tablet, Take by mouth., Disp: , Rfl:  .  Polyethyl Glyc-Propyl Glyc PF (SYSTANE ULTRA PF) 0.4-0.3 % SOLN, Apply 1 each to eye daily., Disp: 30 each, Rfl: 11 .  propranolol ER (INDERAL LA) 60 MG 24 hr capsule, Take 1 capsule (60 mg total) by mouth daily. (Patient taking differently: Take 60 mg by mouth 2 (two) times daily. ), Disp: 90 capsule, Rfl: 3 .  tamsulosin (FLOMAX) 0.4 MG CAPS capsule, Take 1 capsule (0.4 mg total) by mouth daily., Disp: 28 capsule, Rfl: 5 .  Vitamins A & D (VITAMIN A & D) ointment, Apply 1 application topically as needed for dry skin., Disp: 45 g, Rfl: 0   No Known Allergies   Past Surgical History:  Procedure Laterality Date  . LAPAROSCOPIC CHOLECYSTECTOMY    . LAPAROSCOPY  12/03/2010  . VENTRAL HERNIA REPAIR       Family History  Family history unknown: Yes     Social History   Tobacco Use  . Smoking status: Never Smoker  . Smokeless tobacco: Never Used  Substance Use Topics  . Alcohol use: No    Alcohol/week: 0.0 standard drinks    With staff assistance, above reviewed with the patient/caregiver today.  ROS: As per HPI, otherwise no specific complaints on a limited and focused system review   No results found for this or any  previous visit (from the past 72 hour(s)).   PHQ2/9: Depression screen [facility] 2/9 03/05/2020 07/22/2019 06/07/2019 03/28/2019 03/04/2019  Decreased Interest 0 0 0 0 0  Down, Depressed, Hopeless 0 0 0 0 0  PHQ - 2 Score 0 0 0 0 0  Altered sleeping 0 0 - 0 0  Tired, decreased energy 0 0 - 0 0  Change in appetite 0 0 - 0 0  Feeling bad or failure about yourself  0 0 - 0 0  Trouble concentrating 0 0 - 0 0  Moving slowly or fidgety/restless 0 0 - 0 0  Suicidal thoughts 0 0 - 0 0  PHQ-9 Score 0 0 - 0 0  Difficult doing work/chores - - - Not difficult at all Not difficult at all   PHQ-2/9 Result reviewed  Fall Risk: Fall Risk  03/14/2020 03/05/2020 07/22/2019 06/07/2019 03/04/2019  Falls in the past year? 0 0 0 0 0  Number falls in past yr: 0 0 0 0 0  Injury with Fall? 0 0 0 0 0  Follow up - - - Falls prevention discussed Falls evaluation completed      Objective:   Vitals:   03/14/20 1330  BP: 112/74  Pulse: 64  Resp: 16  Temp: 97.6 F (36.4 C)  TempSrc: Temporal  SpO2: 100%  Weight: 254 lb (115.2 kg)  Height: 6' 2" (1.88 m)    Body mass index is 32.61 kg/m.  Physical Exam   NAD, masked HEENT - [place]/AT, sclera anicteric, conj - non-inj'ed, pharynx clear Neck - supple, no adenopathy,  Car -borderline bradycardic with his heart rate approximately 60 on my assessment and the heart regular with no murmurs gallops or rubs Pulm- RR and effort normal at rest, CTA without wheeze or rales Ext -trace LE edema with some very mild pitting at the sock line noted, predominantly on the right side where he had a brace and an Ace wrap beneath the brace. Neuro/psychiatric -communication was very limited  Alert     Results for orders placed or performed in visit on 03/05/20  CULTURE, URINE COMPREHENSIVE  Specimen: Urine  Result Value Ref Range   MICRO NUMBER: [id number]    SPECIMEN QUALITY: Adequate    Source OTHER (SPECIFY)    STATUS: FINAL    ISOLATE 1: Streptococcus agalactiae (A)   CBC  with Differential/Platelet  Result Value Ref Range   WBC 4.8 3.8 - 10.8 Thousand/uL   RBC 4.30 4.20 - 5.80 Million/uL   Hemoglobin 14.0 13.2 - 17.1 g/dL   HCT 40.3 38 - 50 %   MCV 93.7 80.0 - 100.0 fL   MCH 32.6 27.0 - 33.0 pg   MCHC 34.7 32.0 - 36.0 g/dL   RDW 12.7 11.0 - 15.0 %   Platelets 94 (L) 140 - 400 Thousand/uL   MPV 11.2 7.5 - 12.5 fL   Neutro Abs 3,226 1,500 - 7,800 cells/uL   Lymphs Abs 1,080 850 - 3,900 cells/uL   Absolute Monocytes 365 200 - 950 cells/uL   Eosinophils Absolute 110 15 - 500 cells/uL   Basophils Absolute 19 0 - 200 cells/uL   Neutrophils Relative % 67.2 %   Total Lymphocyte 22.5 %   Monocytes Relative 7.6 %   Eosinophils Relative 2.3 %   Basophils Relative 0.4 %   Smear Review    COMPLETE METABOLIC PANEL WITH GFR  Result Value Ref Range   Glucose, Bld 99 65 - 99 mg/dL   BUN 12 7 - 25 mg/dL   Creat 0.81 0.70 - 1.33 mg/dL   GFR, Est Non African American 97 > OR = 60 mL/min/1.24m   GFR, Est African American 113 > OR = 60 mL/min/1.757m  BUN/Creatinine Ratio NOT APPLICABLE 6 - 22 (calc)   Sodium 137 135 - 146 mmol/L   Potassium 4.3 3.5 - 5.3 mmol/L   Chloride 101 98 - 110 mmol/L   CO2 28 20 - 32 mmol/L   Calcium 8.8 8.6 - 10.3 mg/dL   Total Protein 6.3 6.1 - 8.1 g/dL   Albumin 4.2 3.6 - 5.1 g/dL   Globulin 2.1 1.9 - 3.7 g/dL (calc)   AG Ratio 2.0 1.0 - 2.5 (calc)   Total Bilirubin 0.8 0.2 - 1.2 mg/dL   Alkaline phosphatase (APISO) 76 35 - 144 U/L   AST 15 10 - 35 U/L   ALT 15 9 - 46 U/L  Lipid panel  Result Value Ref Range   Cholesterol 114 <200 mg/dL   HDL 53 > OR = 40 mg/dL   Triglycerides 65 <150 mg/dL   LDL Cholesterol (Calc) 47 mg/dL (calc)   Total CHOL/HDL Ratio 2.2 <5.0 (calc)   Non-HDL Cholesterol (Calc) 61 <130 mg/dL (calc)  POCT HgB A1C  Result Value Ref Range   Hemoglobin A1C 4.9 4.0 - 5.6 %   HbA1c POC (<> result, manual entry)     HbA1c, POC (prediabetic range)     HbA1c, POC (controlled diabetic range)    POCT  Urinalysis Dipstick  Result Value Ref Range   Color, UA Yellow    Clarity, UA Clear    Glucose, UA Negative Negative   Bilirubin, UA Negative    Ketones, UA Negative    Spec Grav, UA 1.010 1.010 - 1.025   Blood, UA Positive    pH, UA 7.0 5.0 - 8.0   Protein, UA Negative Negative   Urobilinogen, UA 0.2 0.2 or 1.0 E.U./dL   Nitrite, UA Negative    Leukocytes, UA Negative Negative   Appearance Normal    Odor Abnormal        Assessment & Plan:  1. Acute cystitis without hematuria/dysuria Recently prescribed Amoxil to take for his UTI, with a 7-day course prescribed. Recommended finishing the course, and if his symptoms are not resolved, needs to follow-up  2. Benign essential HTN Blood pressure was good on check today  3. Bradycardia Noted the bradycardia likely due to the medication he is taking, the beta-blocker, and as Dr. Ancil Boozer noted previously, as long as remains asymptomatic, we will continue to monitor  4. Bipolar I disorder, single manic episode, moderate ([place]) 5. Cognitive decline 6. Intellectual disability Currently resides in a group home, and history markedly limited today as noted above as a result.   As above, will follow-up if his symptoms do not resolve after treating the UTI, and otherwise as needed.       Towanda Malkin, MD 03/14/20 1:51 PM

## 2020-03-26 DIAGNOSIS — M179 Osteoarthritis of knee, unspecified: Secondary | ICD-10-CM | POA: Insufficient documentation

## 2020-03-26 DIAGNOSIS — M1711 Unilateral primary osteoarthritis, right knee: Secondary | ICD-10-CM | POA: Diagnosis not present

## 2020-04-02 NOTE — Progress Notes (Signed)
Name: Wayne Jones   MRN: [id number]    DOB: 09-17-1960   Date:04/03/2020       Progress Note  Subjective  Chief Complaint  Update FL-2 forms/Diabetic foot exam  HPI   History of DM: his A1C has been normal for over one year without medication. He is on statin therapy No symptoms of hypoglycemia.  Dysuria/abdominal cramping : lives in a group home, came in [place], not exact sure when symptoms started, complaining of discomfort, not sure about odor, no fever or chills. Recently treated for UTI, we will recheck urine culture, advised follow up with Urologist   HTN: on beta-blocker and Ace, bp today is at goal, no chest pain and no recent episodes of palpitation Heart rate always low , but normal during auscultation today   BPH: heused to seeDr. Jacqlyn Larsen, he still has nocturia,he still masturbates often and causes penile irritation, but not problems at this time . Now under the care of Dr. Shirlean Kelly  He is on Proscar and Flomax, but is having worsening of symptoms, we will check PSA and refer him back    Dyslipidemia: taking statin and Lovaza.Reviewed labs done 08/03 and LDL is at goal   MR: lives in a group, under [place] care, stable behavior, no wandering or aggression, sometimes acts out such as having a bowel movement on the floor when upset or takes his clothe off.   Bipolar disorder: taking medication, but behavior has been stable, does not seem to be depressed or manic. He sees psychiatrist, Dr. Tamera Punt, and since he lives at a group home he is compliant with medications. He is on beta blockers also rx by psychiatrist - up to date   Edema Lower extremities: he has chronic lower extremity edema, doing better on compression stocking hoses.Swelling has been stable. No redness on legs   Bradycardia: he was seen in our office in July and a referral was placed to cardiologist - he never went to see them, however he takes a beta-blocker and is asymptomatic,, we will  refer again, since he was sent to [facility] for labile htn and bradycardia  Thrombocytopenia: last level back to normal, off aspirin   OA: seen by Ortho and seems to be doing okay, wears a brace prn   Patient Active Problem List   Diagnosis Date Noted  . Osteoarthritis of knee 03/26/2020  . Varicose veins, lower extremity, with inflammation, ulcerated, unspecified laterality ([place]) 05/26/2017  . Bilateral lower extremity edema 04/14/2017  . Intellectual disability 03/17/2016  . Polypharmacy 01/10/2016  . Benign prostatic hypertrophy without urinary obstruction 01/09/2015  . Chronic kidney disease (CKD), stage I 01/09/2015  . Cognitive decline 01/09/2015  . Controlled type 2 diabetes mellitus with microalbuminuria ([place]) 01/09/2015  . Acid reflux 01/09/2015  . Hearing loss 01/09/2015  . HLD (hyperlipidemia) 01/09/2015  . Extreme obesity 01/09/2015  . Obstructive sleep apnea 01/09/2015  . Other specified causes of urethral stricture 01/09/2015  . Hernia of anterior abdominal wall 01/09/2015  . Incomplete bladder emptying 07/09/2012  . 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 Benign essential HTN 02/04/2007  . Bipolar I disorder, single manic episode, moderate ([place]) 02/04/2007    Past Surgical History:  Procedure Laterality Date  . LAPAROSCOPIC CHOLECYSTECTOMY    . LAPAROSCOPY  12/03/2010  . VENTRAL HERNIA REPAIR      Family History  Family history unknown: Yes    Social History   Tobacco Use  . Smoking status: Never Smoker  . Smokeless tobacco: Never Used  Substance Use Topics  . Alcohol use: No    Alcohol/week: 0.0 standard drinks     Current Outpatient Medications:  .  Alcohol Swabs (B-D SINGLE USE SWABS REGULAR) PADS, FINGERSTICK BLOOD SUGAR TEST ONCE DAILY IN THE MORNING PRIOR TO BREAKFAST. GLUCOSE GOAL 90-140 FASTING. IF>400 CALL MD, Disp: 100 each, Rfl: PRN .  ARIPiprazole (ABILIFY) 20 MG tablet, Take 20 mg by mouth daily., Disp: , Rfl:  .  ASSURE LANCE LANCETS MISC, 1  each by Does not apply route daily. Fingerstick blood sugar test once daily in the morning prior to breakfast. Glucose Goal: 90-140 Fasting. If >400 call MD Controlled type 2 diabetes mellitus with microalbuminuria, without long-term current use of insulin (HCC)  Dx: E11.29, Disp: 200 each, Rfl: 4 .  atorvastatin (LIPITOR) 10 MG tablet, Take 1 tablet (10 mg total) by mouth daily., Disp: 30 tablet, Rfl: 10 .  Blood Glucose Monitoring Suppl (TRUE METRIX METER) w/Device KIT, by Does not apply route., Disp: , Rfl:  .  carbamazepine (EQUETRO) 200 MG CP12 12 hr capsule, Take 1 capsule (200 mg total) by mouth 2 (two) times daily., Disp: 180 each, Rfl: 3 .  carbamide peroxide (DEBROX) 6.5 % OTIC solution, PLACE 5 DROPS INTO BOTH EARS TWO TIMES A DAY, Disp: 15 mL, Rfl: 0 .  Cholecalciferol (VITAMIN D3) 1.25 MG (50000 UT) CAPS, Take 1 capsule by mouth every 14 (fourteen) days., Disp: , Rfl:  .  clonazePAM (KLONOPIN) 1 MG tablet, Take 1 mg by mouth daily., Disp: , Rfl:  .  DENTA 5000 PLUS 1.1 % CREA dental cream, Take by mouth., Disp: , Rfl:  .  Elastic Bandages & Supports (MEDICAL COMPRESSION SOCKS) MISC, 2 each by Does not apply route daily. Apply in am's and remove it at bedtime, Disp: 2 each, Rfl: 1 .  famotidine (PEPCID) 40 MG tablet, Take 1 tablet (40 mg total) by mouth daily., Disp: 90 tablet, Rfl: 0 .  finasteride (PROSCAR) 5 MG tablet, Take 5 mg by mouth daily., Disp: , Rfl:  .  FLUoxetine (PROZAC) 20 MG capsule, TAKE 1 CAPSULE BY MOUTH DAILY, Disp: 30 capsule, Rfl: 10 .  gabapentin (NEURONTIN) 600 MG tablet, Take 600 mg by mouth at bedtime., Disp: , Rfl:  .  ibuprofen (ADVIL,MOTRIN) 800 MG tablet, TAKE ONE TABLET BY MOUTH EVERY 8 HOURS AS NEEDED, Disp: 30 tablet, Rfl: 10 .  ketoconazole (NIZORAL) 2 % cream, Apply 1 application topically daily. Groin rash, Disp: 60 g, Rfl: 1 .  lisinopril (ZESTRIL) 10 MG tablet, Take 1 tablet (10 mg total) by mouth daily., Disp: 30 tablet, Rfl: 5 .  loratadine  (CLARITIN) 10 MG tablet, TAKE (1) TABLET BY MOUTH DAILY FOR ALLERGY., Disp: 30 tablet, Rfl: 10 .  meloxicam (MOBIC) 15 MG tablet, Take 15 mg by mouth daily., Disp: , Rfl:  .  nystatin cream (MYCOSTATIN), APPLY TOPICALLY TWO TIMES A DAY, Disp: 30 g, Rfl: 0 .  omega-3 acid ethyl esters (LOVAZA) 1 g capsule, TAKE 1 CAPSULE BY MOUTH 2 TIMES DAILY, Disp: 62 capsule, Rfl: 10 .  omeprazole (PRILOSEC) 20 MG capsule, Take daily, Disp: 30 capsule, Rfl: 10 .  ondansetron (ZOFRAN) 4 MG tablet, Take by mouth., Disp: , Rfl:  .  Polyethyl Glyc-Propyl Glyc PF (SYSTANE ULTRA PF) 0.4-0.3 % SOLN, Apply 1 each to eye daily., Disp: 30 each, Rfl: 11 .  propranolol ER (INDERAL LA) 60 MG 24 hr capsule, Take 1 capsule (60 mg total) by mouth daily. (Patient taking differently: Take 60  mg by mouth 2 (two) times daily. ), Disp: 90 capsule, Rfl: 3 .  tamsulosin (FLOMAX) 0.4 MG CAPS capsule, Take 1 capsule (0.4 mg total) by mouth daily., Disp: 28 capsule, Rfl: 5 .  Vitamins A & D (VITAMIN A & D) ointment, Apply 1 application topically as needed for dry skin., Disp: 45 g, Rfl: 0  No Known Allergies  I personally reviewed active problem list, medication list, allergies, family history, social history, health maintenance with the patient/caregiver today.   ROS  Ten systems reviewed and is negative except as mentioned in HPI   Objective  Vitals:   04/03/20 1056  BP: 100/68  Pulse: (!) 48  Resp: 18  Temp: (!) 97.4 F (36.3 C)  TempSrc: Oral  SpO2: 100%  Weight: 250 lb 12.8 oz (113.8 kg)  Height: _0  (1.88 m)    Body mass index is 32.2 kg/m.  Physical Exam   Constitutional: Patient appears well-developed and well-nourished. Obese  No distress.  HEENT: head atraumatic, normocephalic, pupils equal and reactive to light,  neck supple Cardiovascular: Normal rate, regular rhythm and normal heart sounds.  No murmur heard. No BLE edema. Pulmonary/Chest: Effort normal and breath sounds normal. No respiratory  distress. Abdominal: Soft.  There is no tenderness. Psychiatric: Patient has a normal mood and affect. Cooperative    Recent Results (from the past 2160 hour(s))  POCT Urinalysis Dipstick     Status: Abnormal   Collection Time: 03/05/20 12:23 PM  Result Value Ref Range   Color, UA Yellow    Clarity, UA Clear    Glucose, UA Negative Negative   Bilirubin, UA Negative    Ketones, UA Negative    Spec Grav, UA 1.010 1.010 - 1.025   Blood, UA Positive     Comment: Trace   pH, UA 7.0 5.0 - 8.0   Protein, UA Negative Negative   Urobilinogen, UA 0.2 0.2 or 1.0 E.U./dL   Nitrite, UA Negative    Leukocytes, UA Negative Negative   Appearance Normal    Odor Abnormal   POCT HgB A1C     Status: Normal   Collection Time: 03/05/20 12:28 PM  Result Value Ref Range   Hemoglobin A1C 4.9 4.0 - 5.6 %   HbA1c POC (<> result, manual entry)     HbA1c, POC (prediabetic range)     HbA1c, POC (controlled diabetic range)    CBC with Differential/Platelet     Status: Abnormal   Collection Time: 03/06/20  8:08 AM  Result Value Ref Range   WBC 4.8 3.8 - 10.8 Thousand/uL   RBC 4.30 4.20 - 5.80 Million/uL   Hemoglobin 14.0 13.2 - 17.1 g/dL   HCT 40.3 38 - 50 %   MCV 93.7 80.0 - 100.0 fL   MCH 32.6 27.0 - 33.0 pg   MCHC 34.7 32.0 - 36.0 g/dL   RDW 12.7 11.0 - 15.0 %   Platelets 94 (L) 140 - 400 Thousand/uL   MPV 11.2 7.5 - 12.5 fL   Neutro Abs 3,226 1,500 - 7,800 cells/uL   Lymphs Abs 1,080 850 - 3,900 cells/uL   Absolute Monocytes 365 200 - 950 cells/uL   Eosinophils Absolute 110 15 - 500 cells/uL   Basophils Absolute 19 0 - 200 cells/uL   Neutrophils Relative % 67.2 %   Total Lymphocyte 22.5 %   Monocytes Relative 7.6 %   Eosinophils Relative 2.3 %   Basophils Relative 0.4 %   Smear Review  Comment: No platelet clumps seen. Review of peripheral smear confirms automated results.   COMPLETE METABOLIC PANEL WITH GFR     Status: None   Collection Time: 03/06/20  8:08 AM  Result Value Ref  Range   Glucose, Bld 99 65 - 99 mg/dL    Comment: .            Fasting reference interval .    BUN 12 7 - 25 mg/dL   Creat 0.81 0.70 - 1.33 mg/dL    Comment: For patients >22 years of age, the reference limit for Creatinine is approximately 13% higher for people identified as African-American. .    GFR, Est Non African American 97 > OR = 60 mL/min/1.33m   GFR, Est African American 113 > OR = 60 mL/min/1.720m  BUN/Creatinine Ratio NOT APPLICABLE 6 - 22 (calc)   Sodium 137 135 - 146 mmol/L   Potassium 4.3 3.5 - 5.3 mmol/L   Chloride 101 98 - 110 mmol/L   CO2 28 20 - 32 mmol/L   Calcium 8.8 8.6 - 10.3 mg/dL   Total Protein 6.3 6.1 - 8.1 g/dL   Albumin 4.2 3.6 - 5.1 g/dL   Globulin 2.1 1.9 - 3.7 g/dL (calc)   AG Ratio 2.0 1.0 - 2.5 (calc)   Total Bilirubin 0.8 0.2 - 1.2 mg/dL   Alkaline phosphatase (APISO) 76 35 - 144 U/L   AST 15 10 - 35 U/L   ALT 15 9 - 46 U/L  Lipid panel     Status: None   Collection Time: 03/06/20  8:08 AM  Result Value Ref Range   Cholesterol 114 <200 mg/dL   HDL 53 > OR = 40 mg/dL   Triglycerides 65 <150 mg/dL   LDL Cholesterol (Calc) 47 mg/dL (calc)    Comment: Reference range: <100 . Desirable range <100 mg/dL for primary prevention;   <70 mg/dL for patients with CHD or diabetic patients  with > or = 2 CHD risk factors. . Marland KitchenDL-C is now calculated using the Martin-Hopkins  calculation, which is a validated novel method providing  better accuracy than the Friedewald equation in the  estimation of LDL-C.  MaCresenciano Genret al. JAAnnamaria [id number];706(23 [id number]  (http://education.[facility].com/faq/FAQ164)    Total CHOL/HDL Ratio 2.2 <5.0 (calc)   Non-HDL Cholesterol (Calc) 61 <130 mg/dL (calc)    Comment: For patients with diabetes plus 1 major ASCVD risk  factor, treating to a non-HDL-C goal of <100 mg/dL  (LDL-C of <70 mg/dL) is considered a therapeutic  option.   CULTURE, URINE COMPREHENSIVE     Status: Abnormal   Collection Time: 03/06/20   8:08 AM   Specimen: Urine  Result Value Ref Range   MICRO NUMBER: [id number]  SPECIMEN QUALITY: Adequate    Source OTHER (SPECIFY)    STATUS: FINAL    ISOLATE 1: Streptococcus agalactiae (A)     Comment: 50,000-100,000 CFU/mL of Group B Streptococcus isolated Beta-hemolytic streptococci are predictably susceptible to Penicillin and other beta-lactams. Susceptibility testing not routinely performed. Please contact the laboratory within 3 days if  susceptibility testing is desired.     Diabetic Foot Exam: Diabetic Foot Exam - Simple   Simple Foot Form Diabetic Foot exam was performed with the following findings: Yes 04/03/2020 11:34 AM  Visual Inspection See comments: Yes Sensation Testing Intact to touch and monofilament testing bilaterally: Yes Pulse Check Posterior Tibialis and Dorsalis pulse intact bilaterally: Yes Comments Thick toenails , dry skin  PHQ2/9: Depression screen [facility] 2/9 04/03/2020 03/05/2020 07/22/2019 06/07/2019 03/28/2019  Decreased Interest 0 0 0 0 0  Down, Depressed, Hopeless 0 0 0 0 0  PHQ - 2 Score 0 0 0 0 0  Altered sleeping 0 0 0 - 0  Tired, decreased energy 0 0 0 - 0  Change in appetite 0 0 0 - 0  Feeling bad or failure about yourself  0 0 0 - 0  Trouble concentrating 0 0 0 - 0  Moving slowly or fidgety/restless 0 0 0 - 0  Suicidal thoughts 0 0 0 - 0  PHQ-9 Score 0 0 0 - 0  Difficult doing work/chores - - - - Not difficult at all  Some recent data might be hidden    phq 9 is negative   Fall Risk: Fall Risk  04/03/2020 04/03/2020 03/14/2020 03/05/2020 07/22/2019  Falls in the past year? 1 1 0 0 0  Number falls in past yr: 0 0 0 0 0  Injury with Fall? 1 1 0 0 0  Comment Right knee swoll - - - -  Follow up - - - - -     Functional Status Survey: Is the patient deaf or have difficulty hearing?: No Does the patient have difficulty seeing, even when wearing glasses/contacts?: No Does the patient have difficulty concentrating, remembering, or  making decisions?: Yes Does the patient have difficulty walking or climbing stairs?: No Does the patient have difficulty dressing or bathing?: No Does the patient have difficulty doing errands alone such as visiting a doctor's office or shopping?: Yes    Assessment & Plan  1. History of diabetes mellitus, type II  - HM Diabetes Foot Exam  2. Need for immunization against influenza  - Flu Vaccine QUAD 36+ mos IM  3. Lower urinary tract symptoms (LUTS)  Referring him back to Urologist   4. Intellectual disability  FL2 filled out  5. GERD without esophagitis  Seems to be controlled   6. Bipolar I disorder, single manic episode, moderate ([place])  Keep follow up with psychiatrist   7. BPH with obstruction/lower urinary tract symptoms  - PSA - Ambulatory referral to Urology  8. Dyslipidemia  Reviewed labs, at goal   9. Bradycardia  - Ambulatory referral to Cardiology

## 2020-04-03 ENCOUNTER — Encounter: Payer: Self-pay | Admitting: Family Medicine

## 2020-04-03 ENCOUNTER — Other Ambulatory Visit: Payer: Self-pay

## 2020-04-03 ENCOUNTER — Ambulatory Visit (INDEPENDENT_AMBULATORY_CARE_PROVIDER_SITE_OTHER): Payer: Medicare Other | Admitting: Family Medicine

## 2020-04-03 VITALS — BP 100/68 | HR 48 | Temp 97.4°F | Resp 18 | Ht 74.0 in | Wt 250.8 lb

## 2020-04-03 DIAGNOSIS — N138 Other obstructive and reflux uropathy: Secondary | ICD-10-CM

## 2020-04-03 DIAGNOSIS — N401 Enlarged prostate with lower urinary tract symptoms: Secondary | ICD-10-CM | POA: Diagnosis not present

## 2020-04-03 DIAGNOSIS — K219 Gastro-esophageal reflux disease without esophagitis: Secondary | ICD-10-CM

## 2020-04-03 DIAGNOSIS — R001 Bradycardia, unspecified: Secondary | ICD-10-CM | POA: Diagnosis not present

## 2020-04-03 DIAGNOSIS — F79 Unspecified intellectual disabilities: Secondary | ICD-10-CM

## 2020-04-03 DIAGNOSIS — F3012 Manic episode without psychotic symptoms, moderate: Secondary | ICD-10-CM

## 2020-04-03 DIAGNOSIS — Z8639 Personal history of other endocrine, nutritional and metabolic disease: Secondary | ICD-10-CM | POA: Diagnosis not present

## 2020-04-03 DIAGNOSIS — E785 Hyperlipidemia, unspecified: Secondary | ICD-10-CM

## 2020-04-03 DIAGNOSIS — Z23 Encounter for immunization: Secondary | ICD-10-CM

## 2020-04-03 DIAGNOSIS — R399 Unspecified symptoms and signs involving the genitourinary system: Secondary | ICD-10-CM

## 2020-04-03 LAB — PSA: PSA: 0.1 ng/mL (ref <=4.0)

## 2020-04-16 ENCOUNTER — Encounter: Payer: Self-pay | Admitting: Podiatry

## 2020-04-16 ENCOUNTER — Other Ambulatory Visit: Payer: Self-pay

## 2020-04-16 ENCOUNTER — Ambulatory Visit (INDEPENDENT_AMBULATORY_CARE_PROVIDER_SITE_OTHER): Payer: Medicare Other | Admitting: Podiatry

## 2020-04-16 DIAGNOSIS — B351 Tinea unguium: Secondary | ICD-10-CM | POA: Diagnosis not present

## 2020-04-16 DIAGNOSIS — E119 Type 2 diabetes mellitus without complications: Secondary | ICD-10-CM | POA: Diagnosis not present

## 2020-04-16 DIAGNOSIS — M79674 Pain in right toe(s): Secondary | ICD-10-CM

## 2020-04-16 DIAGNOSIS — M79675 Pain in left toe(s): Secondary | ICD-10-CM

## 2020-04-16 NOTE — Progress Notes (Signed)
This patient returns to my office for at risk foot care.  This patient requires this care by a professional since this patient will be at risk due to having diabetes and CKD..  This patient is unable to cut nails himself since the patient cannot reach his nails.These nails are painful walking and wearing shoes.  This patient presents for at risk foot care today.  General Appearance  Alert, conversant and in no acute stress.  Vascular  Dorsalis pedis and posterior tibial  pulses are palpable  bilaterally.  Capillary return is within normal limits  bilaterally. Temperature is within normal limits  bilaterally.  Neurologic  Senn-Weinstein monofilament wire test within normal limits  bilaterally. Muscle power within normal limits bilaterally.  Nails Thick disfigured discolored nails with subungual debris  from second  to fifth toes bilaterally. No evidence of bacterial infection or drainage bilaterally.  Orthopedic  No limitations of motion  feet .  No crepitus or effusions noted.  No bony pathology or digital deformities noted.  Skin  normotropic skin with no porokeratosis noted bilaterally.  No signs of infections or ulcers noted.     Onychomycosis  Pain in right toes  Pain in left toes  Consent was obtained for treatment procedures.   Mechanical debridement of nails 2-5  bilaterally performed with a nail nipper.  Filed with dremel without incident.    Return office visit   6 months                   Told patient to return for periodic foot care and evaluation due to potential at risk complications.   Yobani Schertzer DPM  

## 2020-04-30 ENCOUNTER — Ambulatory Visit: Payer: Self-pay | Admitting: Cardiology

## 2020-05-07 ENCOUNTER — Other Ambulatory Visit: Payer: Self-pay | Admitting: Family Medicine

## 2020-05-07 DIAGNOSIS — I10 Essential (primary) hypertension: Secondary | ICD-10-CM

## 2020-05-07 DIAGNOSIS — E1129 Type 2 diabetes mellitus with other diabetic kidney complication: Secondary | ICD-10-CM

## 2020-05-08 ENCOUNTER — Ambulatory Visit
Admission: EM | Admit: 2020-05-08 | Discharge: 2020-05-08 | Disposition: A | Discharge status: Home / Self Care | Payer: Medicare Other | Attending: Internal Medicine | Admitting: Internal Medicine

## 2020-05-08 ENCOUNTER — Other Ambulatory Visit: Payer: Self-pay

## 2020-05-08 DIAGNOSIS — N410 Acute prostatitis: Secondary | ICD-10-CM | POA: Diagnosis not present

## 2020-05-08 LAB — URINALYSIS, COMPLETE (UACMP) WITH MICROSCOPIC
Bilirubin Urine: NEGATIVE
Glucose, UA: NEGATIVE mg/dL
Hgb urine dipstick: NEGATIVE
Ketones, ur: NEGATIVE mg/dL
Leukocytes,Ua: NEGATIVE
Nitrite: NEGATIVE
Protein, ur: NEGATIVE mg/dL
Specific Gravity, Urine: 1.025 (ref 1.005–1.030)
WBC, UA: NONE SEEN WBC/hpf (ref 0–5)
pH: 6 (ref 5.0–8.0)

## 2020-05-08 MED ORDER — SULFAMETHOXAZOLE-TRIMETHOPRIM 800-160 MG PO TABS: 1.0000 | ORAL_TABLET | Freq: Two times a day (BID) | ORAL | 0 refills | Status: DC

## 2020-05-08 NOTE — ED Triage Notes (Signed)
Patient presents to UC with caregiver. Reports that patient has been having an issue with decreased urine output. States that he has to have a BM before he can release urine. Reports that has been complaining about his penis hurting at times. Unsure of onset of symptoms.

## 2020-05-08 NOTE — Discharge Instructions (Addendum)
Take the Bactrim twice daily with a full glass of water for 14 days.  He needs to follow-up with his PCP to make sure the infection has resolved. Return for new or worsening symptoms or see his PCP.

## 2020-05-08 NOTE — ED Provider Notes (Signed)
[facility]    CSN: [id number] Arrival date & time: 05/08/20  1314      History   Chief Complaint Chief Complaint  Patient presents with   Urinary Retention    HPI Wayne Jones is a 59 y.o. male.   59 yo male here for pain with urination and  trouble starting his stream for " awhile. Patient is with his caregiver. Caregiver denies N/V or fever. He does not need to have a BM to urinate. Caregiver reports that he shakes and hops up and down to get his stream started. His stream will start and stop. Patient says that he feels like he empty's his bladder. Patient also c/o low back pain but caregiver denies chills. Patient also having suprapubic pain.      Past Medical History:  Diagnosis Date   Depression    Diabetes mellitus without complication ([place])    Hyperlipidemia    Hypertension    Mental disorder     Patient Active Problem List   Diagnosis Date Noted   Osteoarthritis of knee 03/26/2020   Varicose veins, lower extremity, with inflammation, ulcerated, unspecified laterality ([place]) 05/26/2017   Bilateral lower extremity edema 04/14/2017   Intellectual disability 03/17/2016   Polypharmacy 01/10/2016   Benign prostatic hypertrophy without urinary obstruction 01/09/2015   Chronic kidney disease (CKD), stage I 01/09/2015   Cognitive decline 01/09/2015   History of diabetes mellitus, type II 01/09/2015   Acid reflux 01/09/2015   Hearing loss 01/09/2015   HLD (hyperlipidemia) 01/09/2015   Extreme obesity 01/09/2015   Obstructive sleep apnea 01/09/2015   Other specified causes of urethral stricture 01/09/2015   Hernia of anterior abdominal wall 01/09/2015   Incomplete bladder emptying 07/09/2012   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   Benign essential HTN 02/04/2007   Bipolar I disorder, single manic episode, moderate ([place]) 02/04/2007    Past Surgical History:  Procedure Laterality Date   LAPAROSCOPIC CHOLECYSTECTOMY      LAPAROSCOPY  12/03/2010   VENTRAL HERNIA REPAIR         Home Medications    Prior to Admission medications   Medication Sig Start Date End Date Taking? Authorizing Provider  Alcohol Swabs (B-D SINGLE USE SWABS REGULAR) PADS FINGERSTICK BLOOD SUGAR TEST ONCE DAILY IN THE MORNING PRIOR TO BREAKFAST. GLUCOSE GOAL 90-140 FASTING. IF>400 CALL MD 05/06/18  Yes Sowles, Drue Stager, MD  ARIPiprazole (ABILIFY) 20 MG tablet Take 20 mg by mouth daily.   Yes [provider]  ASSURE LANCE LANCETS MISC 1 each by Does not apply route daily. Fingerstick blood sugar test once daily in the morning prior to breakfast. Glucose Goal: 90-140 Fasting. If >400 call MD Controlled type 2 diabetes mellitus with microalbuminuria, without long-term current use of insulin (HCC)  Dx: E11.29 04/02/18  Yes Sowles, Drue Stager, MD  atorvastatin (LIPITOR) 10 MG tablet Take 1 tablet (10 mg total) by mouth daily. 07/22/19  Yes Sowles, Drue Stager, MD  Cholecalciferol (VITAMIN D3) 1.25 MG (50000 UT) CAPS Take 1 capsule by mouth every 14 (fourteen) days.   Yes [provider]  clonazePAM (KLONOPIN) 1 MG tablet Take 1 mg by mouth daily.   Yes Alvy Bimler, MD  DENTA 5000 PLUS 1.1 % CREA dental cream Take by mouth. 01/27/20  Yes [provider]  Elastic Bandages & Supports (MEDICAL COMPRESSION SOCKS) MISC 2 each by Does not apply route daily. Apply in am's and remove it at bedtime 01/19/17  Yes Sowles, Drue Stager, MD  famotidine (PEPCID) 40 MG tablet  Take 1 tablet (40 mg total) by mouth daily. 11/23/18  Yes Sowles, Drue Stager, MD  finasteride (PROSCAR) 5 MG tablet Take 5 mg by mouth daily.   Yes [provider]  FLUoxetine (PROZAC) 20 MG capsule TAKE 1 CAPSULE BY MOUTH DAILY 06/30/18  Yes Sowles, Drue Stager, MD  gabapentin (NEURONTIN) 600 MG tablet Take 600 mg by mouth at bedtime.   Yes [provider]  ibuprofen (ADVIL,MOTRIN) 800 MG tablet TAKE ONE TABLET BY MOUTH EVERY 8 HOURS AS NEEDED 11/10/18  Yes Sowles,  Drue Stager, MD  ketoconazole (NIZORAL) 2 % cream Apply 1 application topically daily. Groin rash 03/05/20  Yes Sowles, Drue Stager, MD  lisinopril (ZESTRIL) 10 MG tablet Take 1 tablet (10 mg total) by mouth daily. 03/28/19  Yes Sowles, Drue Stager, MD  loratadine (CLARITIN) 10 MG tablet TAKE (1) TABLET BY MOUTH DAILY FOR ALLERGY. 06/30/18  Yes Sowles, Drue Stager, MD  meloxicam (MOBIC) 15 MG tablet Take 15 mg by mouth daily. 03/05/20  Yes [provider]  nystatin cream (MYCOSTATIN) APPLY TOPICALLY TWO TIMES A DAY 11/16/18  Yes Sowles, Drue Stager, MD  omega-3 acid ethyl esters (LOVAZA) 1 g capsule TAKE 1 CAPSULE BY MOUTH 2 TIMES DAILY 06/03/19  Yes Sowles, Drue Stager, MD  omeprazole (PRILOSEC) 20 MG capsule Take daily 07/22/19  Yes Sowles, Drue Stager, MD  ondansetron (ZOFRAN) 4 MG tablet Take by mouth.   Yes [provider]  Polyethyl Glyc-Propyl Glyc PF (SYSTANE ULTRA PF) 0.4-0.3 % SOLN Apply 1 each to eye daily. 04/02/18  Yes Sowles, Drue Stager, MD  propranolol ER (INDERAL LA) 60 MG 24 hr capsule Take 1 capsule (60 mg total) by mouth daily. Patient taking differently: Take 60 mg by mouth 2 (two) times daily.  10/13/16  Yes Sowles, Drue Stager, MD  tamsulosin (FLOMAX) 0.4 MG CAPS capsule Take 1 capsule (0.4 mg total) by mouth daily. 07/22/19  Yes Sowles, Drue Stager, MD  Vitamins A & D (VITAMIN A & D) ointment Apply 1 application topically as needed for dry skin. 11/10/18  Yes Sowles, Drue Stager, MD  Blood Glucose Monitoring Suppl (TRUE METRIX METER) w/Device KIT by Does not apply route.    [provider]  carbamazepine (EQUETRO) 200 MG CP12 12 hr capsule Take 1 capsule (200 mg total) by mouth 2 (two) times daily. 01/10/16   Plonk, Gwyndolyn Saxon, MD  carbamide peroxide (DEBROX) 6.5 % OTIC solution PLACE 5 DROPS INTO BOTH EARS TWO TIMES A DAY 10/13/18   Steele Sizer, MD  sulfamethoxazole-trimethoprim (BACTRIM DS) 800-160 MG tablet Take 1 tablet by mouth 2 (two) times daily for 14 days. 05/08/20 05/22/20  Margarette Canada, NP     Family History Family History  Family history unknown: Yes    Social History Social History   Tobacco Use   Smoking status: Never Smoker   Smokeless tobacco: Never Used  Scientific laboratory technician Use: Never used  Substance Use Topics   Alcohol use: No    Alcohol/week: 0.0 standard drinks   Drug use: No     Allergies   Patient has no known allergies.   Review of Systems Review of Systems  Constitutional: Negative for activity change, appetite change, fatigue and fever.  HENT: Negative for congestion, sinus pressure and sinus pain.   Respiratory: Negative for cough.   Cardiovascular: Negative for chest pain.  Gastrointestinal: Positive for abdominal pain. Negative for diarrhea, nausea and vomiting.       Suprapubic  Genitourinary: Positive for difficulty urinating. Negative for frequency and urgency.  Musculoskeletal: Positive for back pain.  Low back  Neurological: Negative for dizziness and headaches.  Hematological: Negative.   Psychiatric/Behavioral: Negative.      Physical Exam Triage Vital Signs ED Triage Vitals  Enc Vitals Group     BP 05/08/20 1352 (!) 145/68     Pulse Rate 05/08/20 1352 (!) 53     Resp 05/08/20 1352 19     Temp 05/08/20 1352 98 F (36.7 C)     Temp Source 05/08/20 1352 Oral     SpO2 05/08/20 1352 99 %     Weight 05/08/20 1348 250 lb (113.4 kg)     Height --      Head Circumference --      Peak Flow --      Pain Score --      Pain Loc --      Pain Edu? --      Excl. in [place]? --    No data found.  Updated Vital Signs BP (!) 145/68 (BP Location: Left Arm)    Pulse (!) 53    Temp 98 F (36.7 C) (Oral)    Resp 19    Wt 250 lb (113.4 kg)    SpO2 99%    BMI 32.10 kg/m   Visual Acuity Right Eye Distance:   Left Eye Distance:   Bilateral Distance:    Right Eye Near:   Left Eye Near:    Bilateral Near:     Physical Exam Vitals and nursing note reviewed.  Constitutional:      General: He is not in acute distress.     Appearance: Normal appearance.  HENT:     Head: Normocephalic and atraumatic.  Cardiovascular:     Rate and Rhythm: Normal rate and regular rhythm.     Pulses: Normal pulses.     Heart sounds: Normal heart sounds. No murmur heard.  No gallop.   Pulmonary:     Effort: Pulmonary effort is normal.     Breath sounds: Normal breath sounds. No wheezing, rhonchi or rales.  Abdominal:     General: Abdomen is flat. Bowel sounds are normal.     Palpations: Abdomen is soft.     Tenderness: There is abdominal tenderness. There is no right CVA tenderness or left CVA tenderness.     Hernia: A hernia is present.     Comments: Patient has a ventral hernia on exam that is soft and reducible. Suprapubic tenderness as well.   Genitourinary:    Penis: Normal.      Testes: Normal.     Comments: Patients prostate is high riding but tender to palpation on DRE. There are no nodules appreciated on exam.  Musculoskeletal:        General: No tenderness. Normal range of motion.     Cervical back: Normal range of motion and neck supple.  Lymphadenopathy:     Cervical: No cervical adenopathy.  Skin:    General: Skin is warm and dry.     Capillary Refill: Capillary refill takes less than 2 seconds.     Findings: No bruising or erythema.  Neurological:     General: No focal deficit present.     Mental Status: He is alert and oriented to person, place, and time.  Psychiatric:        Mood and Affect: Mood normal.        Behavior: Behavior normal.        Thought Content: Thought content normal.        Judgment: Judgment  normal.      UC Treatments / Results  Labs (all labs ordered are listed, but only abnormal results are displayed) Labs Reviewed  URINALYSIS, COMPLETE (UACMP) WITH MICROSCOPIC - Abnormal; Notable for the following components:      Result Value   Bacteria, UA RARE (*)    All other components within normal limits  URINE CULTURE    EKG   Radiology No results  found.  Procedures Procedures (including critical care time)  Medications Ordered in UC Medications - No data to display  Initial Impression / Assessment and Plan / UC Course  I have reviewed the triage vital signs and the nursing notes.  Pertinent labs & imaging results that were available during my care of the patient were reviewed by me and considered in my medical decision making (see chart for details).   Patient is here for evaluation of difficulty starting his urine stream. He is with his caregiver who reports that he has had trouble starting his urin stream for a couple of months. His stream will start and stop. He has not had fever or chills. Patient is c/o low back and suprapubic pain, Genital exam is negative for swelling, discharge, or rashes. Rectal exam reveals tender prostate unable to palpate the full extent as it is high riding. Patient had a PSA on 04/03/20 that was 0.1.   Suspect prostatitis versus UTI. Will collect UA.  UA shows rare bacteria. Will culture and treat for prostatitis.  Bactrim DS twice daily for 14 days.  Final Clinical Impressions(s) / UC Diagnoses   Final diagnoses:  Prostatitis, acute     Discharge Instructions     Take the Bactrim twice daily with a full glass of water for 14 days.  He needs to follow-up with his PCP to make sure the infection has resolved. Return for new or worsening symptoms or see his PCP.     ED Prescriptions    Medication Sig Dispense Auth. Provider   sulfamethoxazole-trimethoprim (BACTRIM DS) 800-160 MG tablet Take 1 tablet by mouth 2 (two) times daily for 14 days. 28 tablet Margarette Canada, NP     PDMP not reviewed this encounter.   Margarette Canada, NP 05/08/20 1442

## 2020-05-09 ENCOUNTER — Other Ambulatory Visit: Payer: Self-pay

## 2020-05-09 LAB — URINE CULTURE
Culture: <10000 — AB
Special Requests: NORMAL

## 2020-05-14 ENCOUNTER — Other Ambulatory Visit: Payer: Self-pay | Admitting: Family Medicine

## 2020-05-15 ENCOUNTER — Ambulatory Visit (INDEPENDENT_AMBULATORY_CARE_PROVIDER_SITE_OTHER): Payer: Medicare Other | Admitting: Cardiology

## 2020-05-15 ENCOUNTER — Other Ambulatory Visit: Payer: Self-pay

## 2020-05-15 ENCOUNTER — Encounter: Payer: Self-pay | Admitting: Cardiology

## 2020-05-15 VITALS — BP 136/80 | HR 49 | Ht 74.0 in | Wt 247.0 lb

## 2020-05-15 DIAGNOSIS — I10 Essential (primary) hypertension: Secondary | ICD-10-CM

## 2020-05-15 DIAGNOSIS — E7800 Pure hypercholesterolemia, unspecified: Secondary | ICD-10-CM | POA: Diagnosis not present

## 2020-05-15 DIAGNOSIS — R001 Bradycardia, unspecified: Secondary | ICD-10-CM

## 2020-05-15 MED ORDER — PROPRANOLOL HCL ER 60 MG PO CP24
60.0000 mg | ORAL_CAPSULE | Freq: Every day | ORAL | 5 refills | Status: DC
Start: 2020-05-15 — End: 2020-06-18

## 2020-05-15 NOTE — Patient Instructions (Signed)
Medication Instructions:   DECREASE your propranolol ER (INDERAL LA) 60 MG 24 hr capsule: Take 1 tablet by mouth once a day.  *If you need a refill on your cardiac medications before your next appointment, please call your pharmacy*   Lab Work: None Ordered If you have labs (blood work) drawn today and your tests are completely normal, you will receive your results only by:  [place] (if you have MyChart) OR  A paper copy in the mail If you have any lab test that is abnormal or we need to change your treatment, we will call you to review the results.   Testing/Procedures: None Ordered   Follow-Up: At [facility], you and your health needs are our priority.  As part of our continuing mission to provide you with exceptional heart care, we have created designated Provider Care Teams.  These Care Teams include your primary Cardiologist (physician) and Advanced Practice Providers (APPs -  Physician Assistants and Nurse Practitioners) who all work together to provide you with the care you need, when you need it.  We recommend signing up for the patient portal called "MyChart".  Sign up information is provided on this After Visit Summary.  MyChart is used to connect with patients for Virtual Visits (Telemedicine).  Patients are able to view lab/test results, encounter notes, upcoming appointments, etc.  Non-urgent messages can be sent to your provider as well.   To learn more about what you can do with MyChart, go to [email or web address].    Your next appointment:   1 month(s)  The format for your next appointment:   In Person  Provider:   Kate Sable, MD   Other Instructions

## 2020-05-15 NOTE — Progress Notes (Signed)
Cardiology Office Note:    Date:  05/15/2020   ID:  Wayne Jones, DOB 05/27/1961, MRN [id number]  PCP:  Steele Sizer, MD  [facility] Cardiologist:  No primary care provider on file.  [facility] Electrophysiologist:  None   Referring MD: Steele Sizer, MD   Chief Complaint  Patient presents with  . New Patient (Initial Visit)    Establish care with provider for bradycardia. Medications verbally reviewed with patient.     History of Present Illness:    Wayne Jones is a 59 y.o. male with a hx of diabetes, hyperlipidemia, hypertension, bipolar disorder who presents due to bradycardia.  Patient lives in an assisted living facility, presents today with a staff member.  He denies any symptoms of chest pain, shortness of breath, dizziness, palpitations.  At the facility, his heart rates were noted to be decreased in the 40s.  He takes blood pressure meds including Inderal 60 mg twice daily.  Per staff, he has been on this for years.  Patient has no history of heart disease.  Past Medical History:  Diagnosis Date  . Depression   . Diabetes mellitus without complication ([place])   . Hyperlipidemia   . Hypertension   . Mental disorder     Past Surgical History:  Procedure Laterality Date  . LAPAROSCOPIC CHOLECYSTECTOMY    . LAPAROSCOPY  12/03/2010  . VENTRAL HERNIA REPAIR      Current Medications: Current Meds  Medication Sig  . Alcohol Swabs (B-D SINGLE USE SWABS REGULAR) PADS FINGERSTICK BLOOD SUGAR TEST ONCE DAILY IN THE MORNING PRIOR TO BREAKFAST. GLUCOSE GOAL 90-140 FASTING. IF>400 CALL MD  . ARIPiprazole (ABILIFY) 20 MG tablet Take 20 mg by mouth daily.  Ellin Mayhew LANCETS MISC 1 each by Does not apply route daily. Fingerstick blood sugar test once daily in the morning prior to breakfast. Glucose Goal: 90-140 Fasting. If >400 call MD Controlled type 2 diabetes mellitus with microalbuminuria, without long-term current use of insulin (HCC)  Dx: E11.29  .  atorvastatin (LIPITOR) 10 MG tablet Take 1 tablet (10 mg total) by mouth daily.  . Blood Glucose Monitoring Suppl (TRUE METRIX METER) w/Device KIT by Does not apply route.  . carbamazepine (EQUETRO) 200 MG CP12 12 hr capsule Take 1 capsule (200 mg total) by mouth 2 (two) times daily.  . carbamide peroxide (DEBROX) 6.5 % OTIC solution PLACE 5 DROPS INTO BOTH EARS TWO TIMES A DAY  . Cholecalciferol (VITAMIN D3) 1.25 MG (50000 UT) CAPS Take 1 capsule by mouth every 14 (fourteen) days.  . clonazePAM (KLONOPIN) 1 MG tablet Take 1 mg by mouth daily.  . DENTA 5000 PLUS 1.1 % CREA dental cream Take by mouth.  Water engineer Bandages & Supports (MEDICAL COMPRESSION SOCKS) MISC 2 each by Does not apply route daily. Apply in am's and remove it at bedtime  . famotidine (PEPCID) 40 MG tablet Take 1 tablet (40 mg total) by mouth daily.  . finasteride (PROSCAR) 5 MG tablet Take 5 mg by mouth daily.  Marland Kitchen FLUoxetine (PROZAC) 20 MG capsule TAKE 1 CAPSULE BY MOUTH DAILY  . gabapentin (NEURONTIN) 600 MG tablet Take 600 mg by mouth at bedtime.  Marland Kitchen ibuprofen (ADVIL,MOTRIN) 800 MG tablet TAKE ONE TABLET BY MOUTH EVERY 8 HOURS AS NEEDED  . ketoconazole (NIZORAL) 2 % cream Apply 1 application topically daily. Groin rash  . lisinopril (ZESTRIL) 10 MG tablet TAKE 1 TABLET BY MOUTH ONCE DAILY  . loratadine (CLARITIN) 10 MG tablet TAKE (  1) TABLET BY MOUTH DAILY FOR ALLERGY.  . meloxicam (MOBIC) 15 MG tablet Take 15 mg by mouth daily.  Marland Kitchen nystatin cream (MYCOSTATIN) APPLY TOPICALLY TWO TIMES A DAY  . omega-3 acid ethyl esters (LOVAZA) 1 g capsule TAKE 1 CAPSULE BY MOUTH 2 TIMES DAILY  . omeprazole (PRILOSEC) 20 MG capsule Take daily  . ondansetron (ZOFRAN) 4 MG tablet TAKE 1 TABLET BY MOUTH EVERY EIGHT HOURS AS NEEDED FOR NAUSEA / VOMITING  . Polyethyl Glyc-Propyl Glyc PF (SYSTANE ULTRA PF) 0.4-0.3 % SOLN Apply 1 each to eye daily.  . promethazine (PHENERGAN) 25 MG tablet Take 25 mg by mouth every 6 (six) hours as needed for nausea  or vomiting.  . propranolol ER (INDERAL LA) 60 MG 24 hr capsule Take 1 capsule (60 mg total) by mouth daily. Take 1 capsule by mouth once daily.  . tamsulosin (FLOMAX) 0.4 MG CAPS capsule Take 1 capsule (0.4 mg total) by mouth daily.  . Vitamins A & D (VITAMIN A & D) ointment Apply 1 application topically as needed for dry skin.  . [DISCONTINUED] propranolol ER (INDERAL LA) 60 MG 24 hr capsule Take 1 capsule (60 mg total) by mouth daily. (Patient taking differently: Take 60 mg by mouth 2 (two) times daily. )  . [DISCONTINUED] propranolol ER (INDERAL LA) 60 MG 24 hr capsule Take 1 capsule by mouth once daily.      Allergies:   Patient has no known allergies.   Social History   Socioeconomic History  . Marital status: Single    Spouse name: Not on file  . Number of children: Not on file  . Years of education: Not on file  . Highest education level: Not on file  Occupational History  . Occupation: disabled    Comment: Patient in care home  Tobacco Use  . Smoking status: Never Smoker  . Smokeless tobacco: Never Used  Vaping Use  . Vaping Use: Never used  Substance and Sexual Activity  . Alcohol use: No    Alcohol/week: 0.0 standard drinks  . Drug use: No  . Sexual activity: Never  Other Topics Concern  . Not on file  Social History Narrative   [name] calls him twice weekly. Pt resides at [facility] group home at [employer]    Social Determinants of Health   Financial Resource Strain:   . Difficulty of Paying Living Expenses: Not on file  Food Insecurity:   . Worried About Charity fundraiser in the Last Year: Not on file  . Ran Out of Food in the Last Year: Not on file  Transportation Needs:   . Lack of Transportation (Medical): Not on file  . Lack of Transportation (Non-Medical): Not on file  Physical Activity: Insufficiently Active  . Days of Exercise per Week: 7 days  . Minutes of Exercise per Session: 10 min  Stress:   . Feeling of Stress : Not on file  Social  Connections: Unknown  . Frequency of Communication with Friends and Family: Patient refused  . Frequency of Social Gatherings with Friends and Family: Patient refused  . Attends Religious Services: Not on file  . Active Member of Clubs or Organizations: Not on file  . Attends Archivist Meetings: Not on file  . Marital Status: Not on file     Family History: The patient's Family history is unknown by patient.  ROS:   Please see the history of present illness.     All other systems  reviewed and are negative.  EKGs/Labs/Other Studies Reviewed:    The following studies were reviewed today:   EKG:  EKG is  ordered today.  The ekg ordered today demonstrates sinus bradycardia, otherwise normal ECG, heart rate 49  Recent Labs: 03/06/2020: ALT 15; BUN 12; Creat 0.81; Hemoglobin 14.0; Platelets 94; Potassium 4.3; Sodium 137  Recent Lipid Panel    Component Value Date/Time   CHOL 114 03/06/2020 0808   TRIG 65 03/06/2020 0808   HDL 53 03/06/2020 0808   CHOLHDL 2.2 03/06/2020 0808   VLDL 20 07/18/2016 0823   LDLCALC 47 03/06/2020 0808     Risk Assessment/Calculations:      Physical Exam:    VS:  BP 136/80 (BP Location: Right Arm, Patient Position: Sitting, Cuff Size: Normal)   Pulse (!) 49   Ht 6' 2" (1.88 m)   Wt 247 lb (112 kg)   SpO2 95%   BMI 31.71 kg/m     Wt Readings from Last 3 Encounters:  05/15/20 247 lb (112 kg)  05/08/20 250 lb (113.4 kg)  04/03/20 250 lb 12.8 oz (113.8 kg)     GEN:  Well nourished, well developed in no acute distress HEENT: Normal NECK: No JVD; No carotid bruits LYMPHATICS: No lymphadenopathy CARDIAC: RRR, no murmurs, rubs, gallops RESPIRATORY:  Clear to auscultation without rales, wheezing or rhonchi  ABDOMEN: Soft, non-tender, non-distended MUSCULOSKELETAL:  No edema; No deformity  SKIN: Warm and dry NEUROLOGIC:  Alert and oriented to person and place PSYCHIATRIC:  Normal affect   ASSESSMENT:    1. Bradycardia   2.  Primary hypertension   3. Pure hypercholesterolemia    PLAN:    In order of problems listed above:  1. Patient with asymptomatic bradycardia, likely secondary to beta-blocker/Inderal.  Decrease Inderal LA to 60 mg daily.  No indication for additional testing as patient is asymptomatic.  If heart rate improves at follow-up and blood pressure is controlled, no further changes will be made.  Consider additional reduction versus topic Inderal altogether if heart rates persistently in the 40s. 2. History of hypertension, BP controlled.  Continue current BP meds.  Consider increasing lisinopril with decreasing Inderal as above if blood pressure becomes elevated. 3. History of hyperlipidemia, continue statin as prescribed.  Follow-up in 1 month for heart recheck.   Medication Adjustments/Labs and Tests Ordered: Current medicines are reviewed at length with the patient today.  Concerns regarding medicines are outlined above.  Orders Placed This Encounter  Procedures  . EKG 12-Lead   Meds ordered this encounter  Medications  . propranolol ER (INDERAL LA) 60 MG 24 hr capsule    Sig: Take 1 capsule (60 mg total) by mouth daily. Take 1 capsule by mouth once daily.    Dispense:  30 capsule    Refill:  5    Patient Instructions  Medication Instructions:   DECREASE your propranolol ER (INDERAL LA) 60 MG 24 hr capsule: Take 1 tablet by mouth once a day.  *If you need a refill on your cardiac medications before your next appointment, please call your pharmacy*   Lab Work: None Ordered If you have labs (blood work) drawn today and your tests are completely normal, you will receive your results only by: Marland Kitchen MyChart Message (if you have MyChart) OR . A paper copy in the mail If you have any lab test that is abnormal or we need to change your treatment, we will call you to review the results.   Testing/Procedures: None  Ordered   Follow-Up: At [facility], you and your health needs are  our priority.  As part of our continuing mission to provide you with exceptional heart care, we have created designated Provider Care Teams.  These Care Teams include your primary Cardiologist (physician) and Advanced Practice Providers (APPs -  Physician Assistants and Nurse Practitioners) who all work together to provide you with the care you need, when you need it.  We recommend signing up for the patient portal called "MyChart".  Sign up information is provided on this After Visit Summary.  MyChart is used to connect with patients for Virtual Visits (Telemedicine).  Patients are able to view lab/test results, encounter notes, upcoming appointments, etc.  Non-urgent messages can be sent to your provider as well.   To learn more about what you can do with MyChart, go to [email or web address].    Your next appointment:   1 month(s)  The format for your next appointment:   In Person  Provider:   Kate Sable, MD   Other Instructions      Signed, Kate Sable, MD  05/15/2020 10:54 AM    [place]

## 2020-05-22 ENCOUNTER — Other Ambulatory Visit: Payer: Self-pay | Admitting: Family Medicine

## 2020-05-22 DIAGNOSIS — N481 Balanitis: Secondary | ICD-10-CM

## 2020-05-22 MED ORDER — VITAMINS A & D EX OINT: 1.0000 "application " | TOPICAL_OINTMENT | CUTANEOUS | 0 refills | Status: DC | PRN

## 2020-05-22 NOTE — Telephone Encounter (Signed)
Medication Refill - Medication: Vitamins A & D (VITAMIN A & D) ointment     Preferred Pharmacy (with phone number or street name):  [place], [place] - [place] Phone:  [phone]  Fax:  [phone]       Agent: Please be advised that RX refills may take up to 3 business days. We ask that you follow-up with your pharmacy.

## 2020-06-11 DIAGNOSIS — Z23 Encounter for immunization: Secondary | ICD-10-CM | POA: Diagnosis not present

## 2020-06-14 ENCOUNTER — Other Ambulatory Visit: Payer: Self-pay | Admitting: Family Medicine

## 2020-06-14 DIAGNOSIS — J3089 Other allergic rhinitis: Secondary | ICD-10-CM

## 2020-06-14 DIAGNOSIS — F3012 Manic episode without psychotic symptoms, moderate: Secondary | ICD-10-CM

## 2020-06-14 NOTE — Telephone Encounter (Signed)
Requested medication (s) are due for refill today:  Yes  Requested medication (s) are on the active medication list:  Yes  Future visit scheduled:  Yes  Last Refill: 06/30/2018 for both Allergy Relief and Fluoxetine  Notes to clinic:  rx's expired; please advise  Requested Prescriptions  Pending Prescriptions Disp Refills   ALLERGY RELIEF 10 MG tablet [Pharmacy Med Name: Allergy Relief 10 MG Tablet] 7 tablet 10    Sig: TAKE (1) TABLET BY MOUTH DAILY FOR ALLERGY.      Ear, Nose, and Throat:  Antihistamines Passed - 06/14/2020 11:44 AM      Passed - Valid encounter within last 12 months    Recent Outpatient Visits           2 months ago History of diabetes mellitus, type II   [facility] [place], Drue Stager, MD   3 months ago Acute cystitis without hematuria   [facility] Towanda Malkin, MD   3 months ago [facility] Steele Sizer, MD   10 months ago Benign essential HTN   [facility] [place], Drue Stager, MD   1 year ago Controlled type 2 diabetes mellitus with microalbuminuria, without long-term current use of insulin [facility])   [facility] Steele Sizer, MD       Future Appointments             In 4 days Kate Sable, MD [facility], [facility]   In 2 months Steele Sizer, MD [facility], [place]   In 3 months Steele Sizer, MD [facility], [facility]              FLUoxetine (PROZAC) 20 MG capsule [Pharmacy Med Name: FLUoxetine HCl 20 MG Capsule] 7 capsule 10    Sig: TAKE 1 CAPSULE BY MOUTH DAILY      Psychiatry:  Antidepressants - SSRI Passed - 06/14/2020 11:44 AM      Passed - Valid encounter within last 6 months    Recent Outpatient Visits           2 months ago History of diabetes mellitus, type II   [facility] [place], Drue Stager, MD   3 months ago Acute cystitis without  hematuria   [facility] Towanda Malkin, MD   3 months ago [facility] Steele Sizer, MD   10 months ago Benign essential HTN   [facility] [place], Drue Stager, MD   1 year ago Controlled type 2 diabetes mellitus with microalbuminuria, without long-term current use of insulin [facility])   [facility] Steele Sizer, MD       Future Appointments             In 4 days Agbor-Etang, Aaron Edelman, MD [facility], [facility]   In 2 months Steele Sizer, MD [facility], [place]   In 3 months Steele Sizer, MD [facility], [facility]

## 2020-06-15 ENCOUNTER — Other Ambulatory Visit: Payer: Self-pay | Admitting: Family Medicine

## 2020-06-15 DIAGNOSIS — N481 Balanitis: Secondary | ICD-10-CM

## 2020-06-15 NOTE — Telephone Encounter (Signed)
Requested medication (s) are due for refill today: Yes  Requested medication (s) are on the active medication list: Yes  Last refill:  11/16/18  Future visit scheduled: Yes  Notes to clinic:  Prescriptions have expired.    Requested Prescriptions  Pending Prescriptions Disp Refills   famotidine (PEPCID) 40 MG tablet [Pharmacy Med Name: Famotidine 40 MG Tablet] 7 tablet 10    Sig: TAKE ONE TABLET BY MOUTH EVERY DAY      Gastroenterology:  H2 Antagonists Passed - 06/15/2020 12:46 PM      Passed - Valid encounter within last 12 months    Recent Outpatient Visits           2 months ago History of diabetes mellitus, type II   [facility] [place], Drue Stager, MD   3 months ago Acute cystitis without hematuria   [facility] Towanda Malkin, MD   3 months ago [facility] Steele Sizer, MD   10 months ago Benign essential HTN   [facility] [place], Drue Stager, MD   1 year ago Controlled type 2 diabetes mellitus with microalbuminuria, without long-term current use of insulin [facility])   [facility] Steele Sizer, MD       Future Appointments             In 3 days Kate Sable, MD [facility], [facility]   In 2 months Steele Sizer, MD [facility], [place]   In 3 months Steele Sizer, MD [facility], [facility]              nystatin cream (MYCOSTATIN) [Pharmacy Med Name: Nystatin 100000 UNIT/GM Cream] 30 g 10    Sig: APPLY TOPICALLY TWO TIMES A DAY      Off-Protocol Failed - 06/15/2020 12:46 PM      Failed - Medication not assigned to a protocol, review manually.      Passed - Valid encounter within last 12 months    Recent Outpatient Visits           2 months ago History of diabetes mellitus, type II   [facility] [place], Drue Stager, MD   3 months ago Acute cystitis without hematuria    [facility] Towanda Malkin, MD   3 months ago [facility] Steele Sizer, MD   10 months ago Benign essential HTN   [facility] [place], Drue Stager, MD   1 year ago Controlled type 2 diabetes mellitus with microalbuminuria, without long-term current use of insulin [facility])   [facility] Steele Sizer, MD       Future Appointments             In 3 days Kate Sable, MD [facility], [facility]   In 2 months Steele Sizer, MD [facility], [place]   In 3 months Steele Sizer, MD [facility], [facility]              ibuprofen (ADVIL) 800 MG tablet [Pharmacy Med Name: Ibuprofen 800 MG Tablet] 30 tablet 10    Sig: TAKE ONE TABLET BY MOUTH EVERY 8 HOURS AS NEEDED      Analgesics:  NSAIDS Passed - 06/15/2020 12:46 PM      Passed - Cr in normal range and within 360 days    Creat  Date Value Ref Range Status  03/06/2020 0.81  0.70 - 1.33 mg/dL Final    Comment:    For patients >66 years of age, the reference limit for Creatinine is approximately 13% higher for people identified as African-American. .    Creatinine, Urine  Date Value Ref Range Status  09/27/2018 30 20 - 320 mg/dL Final          Passed - HGB in normal range and within 360 days    Hemoglobin  Date Value Ref Range Status  03/06/2020 14.0 13.2 - 17.1 g/dL Final   HGB  Date Value Ref Range Status  04/10/2013 14.4 13.0 - 18.0 g/dL Final          Passed - Patient is not pregnant      Passed - Valid encounter within last 12 months    Recent Outpatient Visits           2 months ago History of diabetes mellitus, type II   [facility] [place], Drue Stager, MD   3 months ago Acute cystitis without hematuria   [facility] Towanda Malkin, MD   3 months ago [facility] Steele Sizer, MD    10 months ago Benign essential HTN   [facility] [place], Drue Stager, MD   1 year ago Controlled type 2 diabetes mellitus with microalbuminuria, without long-term current use of insulin [facility])   [facility] Steele Sizer, MD       Future Appointments             In 3 days Agbor-Etang, Aaron Edelman, MD [facility], [facility]   In 2 months Steele Sizer, MD [facility], [place]   In 3 months Steele Sizer, MD [facility], [facility]

## 2020-06-18 ENCOUNTER — Telehealth: Payer: Self-pay | Admitting: Cardiology

## 2020-06-18 ENCOUNTER — Encounter: Payer: Self-pay | Admitting: Cardiology

## 2020-06-18 ENCOUNTER — Other Ambulatory Visit: Payer: Self-pay

## 2020-06-18 ENCOUNTER — Ambulatory Visit (INDEPENDENT_AMBULATORY_CARE_PROVIDER_SITE_OTHER): Payer: Medicare Other | Admitting: Cardiology

## 2020-06-18 VITALS — BP 130/90 | HR 50 | Ht 74.0 in | Wt 251.4 lb

## 2020-06-18 DIAGNOSIS — E7800 Pure hypercholesterolemia, unspecified: Secondary | ICD-10-CM

## 2020-06-18 DIAGNOSIS — R001 Bradycardia, unspecified: Secondary | ICD-10-CM | POA: Diagnosis not present

## 2020-06-18 DIAGNOSIS — I10 Essential (primary) hypertension: Secondary | ICD-10-CM | POA: Diagnosis not present

## 2020-06-18 NOTE — Telephone Encounter (Signed)
Patients care giver calling to have the office call patients pharmacy ([facility]) or send a dc notification for patients Propanolol. Patient gets medications bubble packed and this will throw off his medication. Patient is hoping to go this afternoon to pick up mnedications

## 2020-06-18 NOTE — Patient Instructions (Signed)
Medication Instructions:   Your physician has recommended you make the following change in your medication:   STOP taking your propranolol ER (INDERAL LA) 60 MG 24 hr capsule  *If you need a refill on your cardiac medications before your next appointment, please call your pharmacy*   Lab Work: None Ordered If you have labs (blood work) drawn today and your tests are completely normal, you will receive your results only by: Marland Kitchen MyChart Message (if you have MyChart) OR . A paper copy in the mail If you have any lab test that is abnormal or we need to change your treatment, we will call you to review the results.   Testing/Procedures: None   Follow-Up: At [facility], you and your health needs are our priority.  As part of our continuing mission to provide you with exceptional heart care, we have created designated Provider Care Teams.  These Care Teams include your primary Cardiologist (physician) and Advanced Practice Providers (APPs -  Physician Assistants and Nurse Practitioners) who all work together to provide you with the care you need, when you need it.  We recommend signing up for the patient portal called "MyChart".  Sign up information is provided on this After Visit Summary.  MyChart is used to connect with patients for Virtual Visits (Telemedicine).  Patients are able to view lab/test results, encounter notes, upcoming appointments, etc.  Non-urgent messages can be sent to your provider as well.   To learn more about what you can do with MyChart, go to [email or web address].    Your next appointment:   3 month(s)  The format for your next appointment:   In Person  Provider:   Kate Sable, MD   Other Instructions

## 2020-06-18 NOTE — Telephone Encounter (Signed)
[place] and patient had already been in and the discontinued the medication per the paperwork the patient showed them from todays OV.

## 2020-06-18 NOTE — Progress Notes (Signed)
Cardiology Office Note:    Date:  06/18/2020   ID:  Wayne Jones, DOB Dec 13, 1960, MRN [id number]  PCP:  Steele Sizer, MD  [facility] Cardiologist:  No primary care provider on file.  [facility] Electrophysiologist:  None   Referring MD: Steele Sizer, MD   Chief Complaint  Patient presents with  . OTHER    1 month f/u no complaints today. Meds reviewed verbally with pt.    History of Present Illness:    Wayne Jones is a 59 y.o. male with a hx of diabetes, hyperlipidemia, hypertension, bipolar disorder who presents for follow-up.  Last seen due to bradycardia.  Patient lives in an assisted living facility, presents today with a staff member.  Due to bradycardia, heart rates in the 40s, Inderal was decreased to 60 mg daily.  He apparently has been on this medication for years.  Denies any chest pain, shortness of breath, palpitations or dizziness.   Past Medical History:  Diagnosis Date  . Depression   . Diabetes mellitus without complication ([place])   . Hyperlipidemia   . Hypertension   . Mental disorder     Past Surgical History:  Procedure Laterality Date  . LAPAROSCOPIC CHOLECYSTECTOMY    . LAPAROSCOPY  12/03/2010  . VENTRAL HERNIA REPAIR      Current Medications: Current Meds  Medication Sig  . Alcohol Swabs (B-D SINGLE USE SWABS REGULAR) PADS FINGERSTICK BLOOD SUGAR TEST ONCE DAILY IN THE MORNING PRIOR TO BREAKFAST. GLUCOSE GOAL 90-140 FASTING. IF>400 CALL MD  . ARIPiprazole (ABILIFY) 20 MG tablet Take 20 mg by mouth daily.  Ellin Mayhew LANCETS MISC 1 each by Does not apply route daily. Fingerstick blood sugar test once daily in the morning prior to breakfast. Glucose Goal: 90-140 Fasting. If >400 call MD Controlled type 2 diabetes mellitus with microalbuminuria, without long-term current use of insulin (HCC)  Dx: E11.29  . atorvastatin (LIPITOR) 10 MG tablet Take 1 tablet (10 mg total) by mouth daily.  . Blood Glucose Monitoring Suppl (TRUE  METRIX METER) w/Device KIT by Does not apply route.  . carbamazepine (EQUETRO) 200 MG CP12 12 hr capsule Take 1 capsule (200 mg total) by mouth 2 (two) times daily.  . carbamide peroxide (DEBROX) 6.5 % OTIC solution PLACE 5 DROPS INTO BOTH EARS TWO TIMES A DAY  . Cholecalciferol (VITAMIN D3) 1.25 MG (50000 UT) CAPS Take 1 capsule by mouth every 14 (fourteen) days.  . clonazePAM (KLONOPIN) 1 MG tablet Take 1 mg by mouth daily.  . DENTA 5000 PLUS 1.1 % CREA dental cream Take by mouth.  Water engineer Bandages & Supports (MEDICAL COMPRESSION SOCKS) MISC 2 each by Does not apply route daily. Apply in am's and remove it at bedtime  . famotidine (PEPCID) 40 MG tablet Take 1 tablet (40 mg total) by mouth daily.  . finasteride (PROSCAR) 5 MG tablet Take 5 mg by mouth daily.  Marland Kitchen FLUoxetine (PROZAC) 20 MG capsule TAKE 1 CAPSULE BY MOUTH DAILY  . gabapentin (NEURONTIN) 600 MG tablet Take 600 mg by mouth at bedtime.  Marland Kitchen ibuprofen (ADVIL,MOTRIN) 800 MG tablet TAKE ONE TABLET BY MOUTH EVERY 8 HOURS AS NEEDED  . ketoconazole (NIZORAL) 2 % cream Apply 1 application topically daily. Groin rash  . lisinopril (ZESTRIL) 10 MG tablet TAKE 1 TABLET BY MOUTH ONCE DAILY  . loratadine (ALLERGY RELIEF) 10 MG tablet TAKE (1) TABLET BY MOUTH DAILY FOR ALLERGY.  . meloxicam (MOBIC) 15 MG tablet Take 15 mg by  mouth daily.  Marland Kitchen nystatin cream (MYCOSTATIN) APPLY TOPICALLY TWO TIMES A DAY  . omega-3 acid ethyl esters (LOVAZA) 1 g capsule TAKE 1 CAPSULE BY MOUTH 2 TIMES DAILY  . omeprazole (PRILOSEC) 20 MG capsule Take daily  . ondansetron (ZOFRAN) 4 MG tablet TAKE 1 TABLET BY MOUTH EVERY EIGHT HOURS AS NEEDED FOR NAUSEA / VOMITING  . Polyethyl Glyc-Propyl Glyc PF (SYSTANE ULTRA PF) 0.4-0.3 % SOLN Apply 1 each to eye daily.  . promethazine (PHENERGAN) 25 MG tablet Take 25 mg by mouth every 6 (six) hours as needed for nausea or vomiting.  . tamsulosin (FLOMAX) 0.4 MG CAPS capsule Take 1 capsule (0.4 mg total) by mouth daily.  . Vitamins  A & D (VITAMIN A & D) ointment Apply 1 application topically as needed for dry skin.  . [DISCONTINUED] propranolol ER (INDERAL LA) 60 MG 24 hr capsule Take 1 capsule (60 mg total) by mouth daily. Take 1 capsule by mouth once daily.     Allergies:   Patient has no known allergies.   Social History   Socioeconomic History  . Marital status: Single    Spouse name: Not on file  . Number of children: Not on file  . Years of education: Not on file  . Highest education level: Not on file  Occupational History  . Occupation: disabled    Comment: Patient in care home  Tobacco Use  . Smoking status: Never Smoker  . Smokeless tobacco: Never Used  Vaping Use  . Vaping Use: Never used  Substance and Sexual Activity  . Alcohol use: No    Alcohol/week: 0.0 standard drinks  . Drug use: No  . Sexual activity: Never  Other Topics Concern  . Not on file  Social History Narrative   [name] calls him twice weekly. Pt resides at [facility] group home at [employer]    Social Determinants of Health   Financial Resource Strain:   . Difficulty of Paying Living Expenses: Not on file  Food Insecurity:   . Worried About Charity fundraiser in the Last Year: Not on file  . Ran Out of Food in the Last Year: Not on file  Transportation Needs:   . Lack of Transportation (Medical): Not on file  . Lack of Transportation (Non-Medical): Not on file  Physical Activity:   . Days of Exercise per Week: Not on file  . Minutes of Exercise per Session: Not on file  Stress:   . Feeling of Stress : Not on file  Social Connections:   . Frequency of Communication with Friends and Family: Not on file  . Frequency of Social Gatherings with Friends and Family: Not on file  . Attends Religious Services: Not on file  . Active Member of Clubs or Organizations: Not on file  . Attends Archivist Meetings: Not on file  . Marital Status: Not on file     Family History: The patient's Family history is  unknown by patient.  ROS:   Please see the history of present illness.     All other systems reviewed and are negative.  EKGs/Labs/Other Studies Reviewed:    The following studies were reviewed today:   EKG:  EKG is  ordered today.  The ekg ordered today demonstrates sinus bradycardia, otherwise normal ECG, heart rate 49  Recent Labs: 03/06/2020: ALT 15; BUN 12; Creat 0.81; Hemoglobin 14.0; Platelets 94; Potassium 4.3; Sodium 137  Recent Lipid Panel    Component Value Date/Time  CHOL 114 03/06/2020 0808   TRIG 65 03/06/2020 0808   HDL 53 03/06/2020 0808   CHOLHDL 2.2 03/06/2020 0808   VLDL 20 07/18/2016 0823   LDLCALC 47 03/06/2020 0808     Risk Assessment/Calculations:      Physical Exam:    VS:  BP 130/90 (BP Location: Left Arm, Patient Position: Sitting, Cuff Size: Normal)   Pulse (!) 50   Ht _0  (1.88 m)   Wt 251 lb 6 oz (114 kg)   SpO2 99%   BMI 32.27 kg/m     Wt Readings from Last 3 Encounters:  06/18/20 251 lb 6 oz (114 kg)  05/15/20 247 lb (112 kg)  05/08/20 250 lb (113.4 kg)     GEN:  Well nourished, well developed in no acute distress HEENT: Normal NECK: No JVD; No carotid bruits LYMPHATICS: No lymphadenopathy CARDIAC: RRR, no murmurs, rubs, gallops RESPIRATORY:  Clear to auscultation without rales, wheezing or rhonchi  ABDOMEN: Soft, non-tender, non-distended MUSCULOSKELETAL:  No edema; No deformity  SKIN: Warm and dry NEUROLOGIC:  Alert and oriented to person and place PSYCHIATRIC:  Normal affect   ASSESSMENT:    1. Bradycardia   2. Primary hypertension   3. Pure hypercholesterolemia    PLAN:    In order of problems listed above:  1. Patient still with asymptomatic bradycardia, after decreasing dose of Inderal.  Likely secondary to beta-blocker/Inderal.  We will stop Inderal. No indication for additional testing as patient is asymptomatic.  2. History of hypertension, BP controlled.  Continue current BP meds.  Monitor BPs, if  elevated, can increase dose of lisinopril. 3. History of hyperlipidemia, continue statin   Follow-up in 3 months   Medication Adjustments/Labs and Tests Ordered: Current medicines are reviewed at length with the patient today.  Concerns regarding medicines are outlined above.  Orders Placed This Encounter  Procedures  . EKG 12-Lead   No orders of the defined types were placed in this encounter.   Patient Instructions  Medication Instructions:   Your physician has recommended you make the following change in your medication:   STOP taking your propranolol ER (INDERAL LA) 60 MG 24 hr capsule  *If you need a refill on your cardiac medications before your next appointment, please call your pharmacy*   Lab Work: None Ordered If you have labs (blood work) drawn today and your tests are completely normal, you will receive your results only by: Marland Kitchen MyChart Message (if you have MyChart) OR . A paper copy in the mail If you have any lab test that is abnormal or we need to change your treatment, we will call you to review the results.   Testing/Procedures: None   Follow-Up: At [facility], you and your health needs are our priority.  As part of our continuing mission to provide you with exceptional heart care, we have created designated Provider Care Teams.  These Care Teams include your primary Cardiologist (physician) and Advanced Practice Providers (APPs -  Physician Assistants and Nurse Practitioners) who all work together to provide you with the care you need, when you need it.  We recommend signing up for the patient portal called "MyChart".  Sign up information is provided on this After Visit Summary.  MyChart is used to connect with patients for Virtual Visits (Telemedicine).  Patients are able to view lab/test results, encounter notes, upcoming appointments, etc.  Non-urgent messages can be sent to your provider as well.   To learn more about what you  can do with MyChart, go  to [email or web address].    Your next appointment:   3 month(s)  The format for your next appointment:   In Person  Provider:   Kate Sable, MD   Other Instructions     Signed, Kate Sable, MD  06/18/2020 12:33 PM    [place]

## 2020-06-21 ENCOUNTER — Ambulatory Visit (INDEPENDENT_AMBULATORY_CARE_PROVIDER_SITE_OTHER): Payer: Medicare Other

## 2020-06-21 DIAGNOSIS — Z0000 Encounter for general adult medical examination without abnormal findings: Secondary | ICD-10-CM | POA: Diagnosis not present

## 2020-06-21 NOTE — Progress Notes (Signed)
Subjective:   Wayne Jones is a 59 y.o. male who presents for Medicare Annual/Subsequent preventive examination.  Virtual Visit via Telephone Note  I connected with  Wayne Jones on 06/21/20 at  2:50 PM EST by telephone and verified that I am speaking with the correct person using two identifiers.  Medicare Annual Wellness visit completed telephonically due to Covid-19 pandemic.   Location: Patient: home Provider: [facility]   I discussed the limitations, risks, security and privacy concerns of performing an evaluation and management service by telephone and the availability of in person appointments. The patient expressed understanding and agreed to proceed.  Unable to perform video visit due to video visit attempted and failed and/or patient does not have video capability.   Some vital signs may be absent or patient reported.   [name], LPN    Review of Systems     Cardiac Risk Factors include: advanced age (>51men, >66 women);dyslipidemia;male gender;hypertension;obesity (BMI >30kg/m2)     Objective:    There were no vitals filed for this visit. There is no height or weight on file to calculate BMI.  Advanced Directives 06/21/2020 05/08/2020 05/26/2017 03/24/2017 02/25/2017 02/18/2017 02/11/2017  Does Patient Have a Medical Advance Directive? Unable to assess, patient is non-responsive or altered mental status No Yes No No No No  Type of Advance Directive - - [place]  Does patient want to make changes to medical advance directive? - - - - - - -  Copy of [place] in Chart? - - - - - - -    Current Medications (verified) Outpatient Encounter Medications as of 06/21/2020  Medication Sig  . ARIPiprazole (ABILIFY) 20 MG tablet Take 20 mg by mouth daily.  Marland Kitchen atorvastatin (LIPITOR) 10 MG tablet Take 1 tablet (10 mg total) by mouth daily.  . carbamazepine (EQUETRO) 200 MG CP12 12 hr capsule Take 1 capsule (200 mg total) by  mouth 2 (two) times daily.  . carbamide peroxide (DEBROX) 6.5 % OTIC solution PLACE 5 DROPS INTO BOTH EARS TWO TIMES A DAY  . Cholecalciferol (VITAMIN D3) 1.25 MG (50000 UT) CAPS Take 1 capsule by mouth every 14 (fourteen) days.  . clonazePAM (KLONOPIN) 1 MG tablet Take 1 mg by mouth daily.  . DENTA 5000 PLUS 1.1 % CREA dental cream Take by mouth.  Water engineer Bandages & Supports (MEDICAL COMPRESSION SOCKS) MISC 2 each by Does not apply route daily. Apply in am's and remove it at bedtime  . famotidine (PEPCID) 40 MG tablet Take 1 tablet (40 mg total) by mouth daily.  . finasteride (PROSCAR) 5 MG tablet Take 5 mg by mouth daily.  Marland Kitchen FLUoxetine (PROZAC) 20 MG capsule TAKE 1 CAPSULE BY MOUTH DAILY  . gabapentin (NEURONTIN) 600 MG tablet Take 600 mg by mouth at bedtime.  Marland Kitchen ibuprofen (ADVIL,MOTRIN) 800 MG tablet TAKE ONE TABLET BY MOUTH EVERY 8 HOURS AS NEEDED  . ketoconazole (NIZORAL) 2 % cream Apply 1 application topically daily. Groin rash  . lisinopril (ZESTRIL) 10 MG tablet TAKE 1 TABLET BY MOUTH ONCE DAILY  . loratadine (ALLERGY RELIEF) 10 MG tablet TAKE (1) TABLET BY MOUTH DAILY FOR ALLERGY.  . meloxicam (MOBIC) 15 MG tablet Take 15 mg by mouth daily.  Marland Kitchen nystatin cream (MYCOSTATIN) APPLY TOPICALLY TWO TIMES A DAY  . omega-3 acid ethyl esters (LOVAZA) 1 g capsule TAKE 1 CAPSULE BY MOUTH 2 TIMES DAILY  . omeprazole (PRILOSEC) 20 MG capsule Take  daily  . ondansetron (ZOFRAN) 4 MG tablet TAKE 1 TABLET BY MOUTH EVERY EIGHT HOURS AS NEEDED FOR NAUSEA / VOMITING  . Polyethyl Glyc-Propyl Glyc PF (SYSTANE ULTRA PF) 0.4-0.3 % SOLN Apply 1 each to eye daily.  . promethazine (PHENERGAN) 25 MG tablet Take 25 mg by mouth every 6 (six) hours as needed for nausea or vomiting.  . tamsulosin (FLOMAX) 0.4 MG CAPS capsule Take 1 capsule (0.4 mg total) by mouth daily.  . Vitamins A & D (VITAMIN A & D) ointment Apply 1 application topically as needed for dry skin.  . Alcohol Swabs (B-D SINGLE USE SWABS REGULAR) PADS  FINGERSTICK BLOOD SUGAR TEST ONCE DAILY IN THE MORNING PRIOR TO BREAKFAST. GLUCOSE GOAL 90-140 FASTING. IF>400 CALL MD (Patient not taking: Reported on 06/21/2020)  . ASSURE LANCE LANCETS MISC 1 each by Does not apply route daily. Fingerstick blood sugar test once daily in the morning prior to breakfast. Glucose Goal: 90-140 Fasting. If >400 call MD Controlled type 2 diabetes mellitus with microalbuminuria, without long-term current use of insulin (HCC)  Dx: E11.29 (Patient not taking: Reported on 06/21/2020)  . Blood Glucose Monitoring Suppl (TRUE METRIX METER) w/Device KIT by Does not apply route. (Patient not taking: Reported on 06/21/2020)   No facility-administered encounter medications on file as of 06/21/2020.    Allergies (verified) Patient has no known allergies.   History: Past Medical History:  Diagnosis Date  . Depression   . Diabetes mellitus without complication ([place])   . Hyperlipidemia   . Hypertension   . Mental disorder    Past Surgical History:  Procedure Laterality Date  . LAPAROSCOPIC CHOLECYSTECTOMY    . LAPAROSCOPY  12/03/2010  . VENTRAL HERNIA REPAIR     Family History  Family history unknown: Yes   Social History   Socioeconomic History  . Marital status: Single    Spouse name: Not on file  . Number of children: Not on file  . Years of education: Not on file  . Highest education level: Not on file  Occupational History  . Occupation: disabled    Comment: Patient in care home  Tobacco Use  . Smoking status: Never Smoker  . Smokeless tobacco: Never Used  Vaping Use  . Vaping Use: Never used  Substance and Sexual Activity  . Alcohol use: No    Alcohol/week: 0.0 standard drinks  . Drug use: No  . Sexual activity: Never  Other Topics Concern  . Not on file  Social History Narrative   [name] calls him twice weekly. Pt resides at [facility] group home at [employer]    Social Determinants of Health   Financial Resource Strain: Low Risk     . Difficulty of Paying Living Expenses: Not hard at all  Food Insecurity: No Food Insecurity  . Worried About Charity fundraiser in the Last Year: Never true  . Ran Out of Food in the Last Year: Never true  Transportation Needs: No Transportation Needs  . Lack of Transportation (Medical): No  . Lack of Transportation (Non-Medical): No  Physical Activity: Insufficiently Active  . Days of Exercise per Week: 7 days  . Minutes of Exercise per Session: 10 min  Stress: No Stress Concern Present  . Feeling of Stress : Not at all  Social Connections: Unknown  . Frequency of Communication with Friends and Family: Patient refused  . Frequency of Social Gatherings with Friends and Family: Patient refused  . Attends Religious Services: Patient refused  .  Active Member of Clubs or Organizations: Patient refused  . Attends Archivist Meetings: Patient refused  . Marital Status: Never married    Tobacco Counseling Counseling given: Not Answered   Clinical Intake:  Pre-visit preparation completed: Yes  Pain : No/denies pain     Nutritional Risks: None Diabetes: No  How often do you need to have someone help you when you read instructions, pamphlets, or other written materials from your doctor or pharmacy?: 1 - Never    Interpreter Needed?: No  Information entered by :: [name] LPN   Activities of Daily Living In your present state of health, do you have any difficulty performing the following activities: 06/21/2020 04/03/2020  Hearing? Y N  Comment declines hearing aids -  Vision? N N  Difficulty concentrating or making decisions? Tempie Donning  Walking or climbing stairs? Y N  Dressing or bathing? N N  Doing errands, shopping? Tempie Donning  Preparing Food and eating ? N -  Using the Toilet? N -  In the past six months, have you accidently leaked urine? N -  Do you have problems with loss of bowel control? N -  Managing your Medications? N -  Managing your Finances? N -   Housekeeping or managing your Housekeeping? N -  Some recent data might be hidden    Patient Care Team: Steele Sizer, MD as PCP - General (Family Medicine) Alvy Bimler, MD as Referring Physician (Psychiatry) Gardiner Barefoot, DPM as Consulting Physician (Podiatry) Delfin Edis, MD as Referring Physician (Urology)  Indicate any recent Medical Services you may have received from other than [facility] providers in the past year (date may be approximate).     Assessment:   This is a routine wellness examination for Gal.  Hearing/Vision screen  Hearing Screening   '125Hz'$  $[place]'250Hz'oUGen$'500Hz'$'1000Hz'$'2000Hz'$'3000Hz'$'4000Hz'$'6000Hz'$'8000Hz'$   Right ear:           Left ear:           Comments: Caregiver reports patient to be hard of hearing lately; may be interested in hearing evaluation   Vision Screening Comments: Annual vision screenings done at [facility]  Dietary issues and exercise activities discussed: Current Exercise Habits: Home exercise routine, Type of exercise: walking, Time (Minutes): 10, Frequency (Times/Week): 7, Weekly Exercise (Minutes/Week): 70, Intensity: Mild, Exercise limited by: orthopedic condition(s)  Goals    . DIET - EAT MORE FRUITS AND VEGETABLES    . Exercise 3x per week (30 min per time)      Depression Screen PHQ 2/9 Scores 06/21/2020 04/03/2020 03/14/2020 03/05/2020 07/22/2019 06/07/2019 03/28/2019  PHQ - 2 Score 0 0 - 0 0 0 0  PHQ- 9 Score - 0 - 0 0 - 0  Exception Documentation - - Medical reason - - - -    Fall Risk Fall Risk  06/21/2020 04/03/2020 04/03/2020 03/14/2020 03/05/2020  Falls in the past year? 0 1 1 0 0  Number falls in past yr: 0 0 0 0 0  Injury with Fall? 0 1 1 0 0  Comment - Right knee swoll - - -  Risk for fall due to : No Fall Risks - - - -  Follow up Falls prevention discussed - - - -    Any stairs in or around the home? Yes  If so, are there any without handrails? No  Home free of loose throw rugs in walkways, pet beds,  electrical cords, etc? Yes  Adequate  lighting in your home to reduce risk of falls? Yes   ASSISTIVE DEVICES UTILIZED TO PREVENT FALLS:  Life alert? No  Use of a cane, walker or w/c? No  Grab bars in the bathroom? Yes  Shower chair or bench in shower? No  Elevated toilet seat or a handicapped toilet? No   TIMED UP AND GO:  Was the test performed? No . Telephonic visit.  Cognitive Function:        Immunizations Immunization History  Administered Date(s) Administered  . Influenza, Seasonal, Injecte, Preservative Fre 05/30/2010, 05/28/2011  . Influenza,inj,Quad PF,6+ Mos 04/13/2013, 04/24/2014, 03/14/2015, 06/13/2016, 05/20/2017, 03/26/2018, 06/04/2018, 04/20/2019, 04/03/2020  . Influenza,inj,quad, With Preservative 05/05/2019  . Moderna SARS-COVID-2 Vaccination 09/07/2019, 10/06/2019, 06/11/2020  . Pneumococcal Conjugate-13 05/25/2017  . Pneumococcal Polysaccharide-23 03/31/2012, 03/17/2016  . Tdap 03/17/2016, 10/05/2016, 08/13/2017    TDAP status: Up to date   Flu Vaccine status: Up to date   Pneumococcal vaccine status: Up to date   Covid-19 vaccine status: Completed vaccines  Qualifies for Shingles Vaccine? Yes   Zostavax completed No   Shingrix Completed?: No.    Education has been provided regarding the importance of this vaccine. Patient has been advised to call insurance company to determine out of pocket expense if they have not yet received this vaccine. Advised may also receive vaccine at local pharmacy or Health Dept. Verbalized acceptance and understanding.  Screening Tests Health Maintenance  Topic Date Due  . HIV Screening  04/02/2021 (Originally 10/17/1975)  . Fecal DNA (Cologuard)  05/26/2021  . TETANUS/TDAP  08/14/2027  . INFLUENZA VACCINE  Completed  . COVID-19 Vaccine  Completed  . Hepatitis C Screening  Completed    Health Maintenance  There are no preventive care reminders to display for this patient.  Colorectal Cancer Screening: Cologuard  completed 05/26/18. Repeat every 3 years.   Lung Cancer Screening: (Low Dose CT Chest recommended if Age 8-80 years, 30 pack-year currently smoking OR have quit w/in 15years.) does not qualify.   Additional Screening:  Hepatitis C Screening: does qualify; Completed 01/01/16  Vision Screening: Recommended annual ophthalmology exams for early detection of glaucoma and other disorders of the eye. Is the patient up to date with their annual eye exam?  Yes  Who is the provider or what is the name of the office in which the patient attends annual eye exams? [place] Screening: Recommended annual dental exams for proper oral hygiene  Community Resource Referral / Chronic Care Management: CRR required this visit?  No   CCM required this visit?  No      Plan:     I have personally reviewed and noted the following in the patient's chart:   . Medical and social history . Use of alcohol, tobacco or illicit drugs  . Current medications and supplements . Functional ability and status . Nutritional status . Physical activity . Advanced directives . List of other physicians . Hospitalizations, surgeries, and ER visits in previous 12 months . Vitals . Screenings to include cognitive, depression, and falls . Referrals and appointments  In addition, I have reviewed and discussed with patient certain preventive protocols, quality metrics, and best practice recommendations. A written personalized care plan for preventive services as well as general preventive health recommendations were provided to patient.     [name], LPN   [id number]   Nurse Notes: visit completed with assistance from Seth Bake, caregiver as [facility] who reports p/o discomfort and redness in groin area; pt uses nystatin  and ketoconazole when needed

## 2020-06-21 NOTE — Patient Instructions (Signed)
Wayne Jones , Thank you for taking time to come for your Medicare Wellness Visit. I appreciate your ongoing commitment to your health goals. Please review the following plan we discussed and let me know if I can assist you in the future.   Screening recommendations/referrals: Colonoscopy: Cologuard completed 05/26/18. Repeat in 2022 Recommended yearly ophthalmology/optometry visit for glaucoma screening and checkup Recommended yearly dental visit for hygiene and checkup  Vaccinations: Influenza vaccine: done 04/03/20 Pneumococcal vaccine: done 05/25/17 Tdap vaccine: done 08/13/17 Shingles vaccine: Shingrix discussed. Please contact your pharmacy for coverage information.  Covid-19: done 09/07/19, 10/06/19 & 06/11/20  Conditions/risks identified: Recommend continuing physical activity as tolerated  Next appointment: Follow up in one year for your annual wellness visit   Preventive Care 40-64 Years, Male Preventive care refers to lifestyle choices and visits with your health care provider that can promote health and wellness. What does preventive care include?  A yearly physical exam. This is also called an annual well check.  Dental exams once or twice a year.  Routine eye exams. Ask your health care provider how often you should have your eyes checked.  Personal lifestyle choices, including:  Daily care of your teeth and gums.  Regular physical activity.  Eating a healthy diet.  Avoiding tobacco and drug use.  Limiting alcohol use.  Practicing safe sex.  Taking low-dose aspirin every day starting at age 48. What happens during an annual well check? The services and screenings done by your health care provider during your annual well check will depend on your age, overall health, lifestyle risk factors, and family history of disease. Counseling  Your health care provider may ask you questions about your:  Alcohol use.  Tobacco use.  Drug use.  Emotional  well-being.  Home and relationship well-being.  Sexual activity.  Eating habits.  Work and work Statistician. Screening  You may have the following tests or measurements:  Height, weight, and BMI.  Blood pressure.  Lipid and cholesterol levels. These may be checked every 5 years, or more frequently if you are over 12 years old.  Skin check.  Lung cancer screening. You may have this screening every year starting at age 15 if you have a 30-pack-year history of smoking and currently smoke or have quit within the past 15 years.  Fecal occult blood test (FOBT) of the stool. You may have this test every year starting at age 21.  Flexible sigmoidoscopy or colonoscopy. You may have a sigmoidoscopy every 5 years or a colonoscopy every 10 years starting at age 75.  Prostate cancer screening. Recommendations will vary depending on your family history and other risks.  Hepatitis C blood test.  Hepatitis B blood test.  Sexually transmitted disease (STD) testing.  Diabetes screening. This is done by checking your blood sugar (glucose) after you have not eaten for a while (fasting). You may have this done every 1-3 years. Discuss your test results, treatment options, and if necessary, the need for more tests with your health care provider. Vaccines  Your health care provider may recommend certain vaccines, such as:  Influenza vaccine. This is recommended every year.  Tetanus, diphtheria, and acellular pertussis (Tdap, Td) vaccine. You may need a Td booster every 10 years.  Zoster vaccine. You may need this after age 85.  Pneumococcal 13-valent conjugate (PCV13) vaccine. You may need this if you have certain conditions and have not been vaccinated.  Pneumococcal polysaccharide (PPSV23) vaccine. You may need one or two doses if you smoke  cigarettes or if you have certain conditions. Talk to your health care provider about which screenings and vaccines you need and how often you need  them. This information is not intended to replace advice given to you by your health care provider. Make sure you discuss any questions you have with your health care provider. Document Released: 08/17/2015 Document Revised: 04/09/2016 Document Reviewed: 05/22/2015 Elsevier Interactive Patient Education  2017 [place] Prevention in the Home Falls can cause injuries. They can happen to people of all ages. There are many things you can do to make your home safe and to help prevent falls. What can I do on the outside of my home?  Regularly fix the edges of walkways and driveways and fix any cracks.  Remove anything that might make you trip as you walk through a door, such as a raised step or threshold.  Trim any bushes or trees on the path to your home.  Use bright outdoor lighting.  Clear any walking paths of anything that might make someone trip, such as rocks or tools.  Regularly check to see if handrails are loose or broken. Make sure that both sides of any steps have handrails.  Any raised decks and porches should have guardrails on the edges.  Have any leaves, snow, or ice cleared regularly.  Use sand or salt on walking paths during winter.  Clean up any spills in your garage right away. This includes oil or grease spills. What can I do in the bathroom?  Use night lights.  Install grab bars by the toilet and in the tub and shower. Do not use towel bars as grab bars.  Use non-skid mats or decals in the tub or shower.  If you need to sit down in the shower, use a plastic, non-slip stool.  Keep the floor dry. Clean up any water that spills on the floor as soon as it happens.  Remove soap buildup in the tub or shower regularly.  Attach bath mats securely with double-sided non-slip rug tape.  Do not have throw rugs and other things on the floor that can make you trip. What can I do in the bedroom?  Use night lights.  Make sure that you have a light by your  bed that is easy to reach.  Do not use any sheets or blankets that are too big for your bed. They should not hang down onto the floor.  Have a firm chair that has side arms. You can use this for support while you get dressed.  Do not have throw rugs and other things on the floor that can make you trip. What can I do in the kitchen?  Clean up any spills right away.  Avoid walking on wet floors.  Keep items that you use a lot in easy-to-reach places.  If you need to reach something above you, use a strong step stool that has a grab bar.  Keep electrical cords out of the way.  Do not use floor polish or wax that makes floors slippery. If you must use wax, use non-skid floor wax.  Do not have throw rugs and other things on the floor that can make you trip. What can I do with my stairs?  Do not leave any items on the stairs.  Make sure that there are handrails on both sides of the stairs and use them. Fix handrails that are broken or loose. Make sure that handrails are as long as the stairways.  Check any carpeting to make sure that it is firmly attached to the stairs. Fix any carpet that is loose or worn.  Avoid having throw rugs at the top or bottom of the stairs. If you do have throw rugs, attach them to the floor with carpet tape.  Make sure that you have a light switch at the top of the stairs and the bottom of the stairs. If you do not have them, ask someone to add them for you. What else can I do to help prevent falls?  Wear shoes that:  Do not have high heels.  Have rubber bottoms.  Are comfortable and fit you well.  Are closed at the toe. Do not wear sandals.  If you use a stepladder:  Make sure that it is fully opened. Do not climb a closed stepladder.  Make sure that both sides of the stepladder are locked into place.  Ask someone to hold it for you, if possible.  Clearly mark and make sure that you can see:  Any grab bars or handrails.  First and last  steps.  Where the edge of each step is.  Use tools that help you move around (mobility aids) if they are needed. These include:  Canes.  Walkers.  Scooters.  Crutches.  Turn on the lights when you go into a dark area. Replace any light bulbs as soon as they burn out.  Set up your furniture so you have a clear path. Avoid moving your furniture around.  If any of your floors are uneven, fix them.  If there are any pets around you, be aware of where they are.  Review your medicines with your doctor. Some medicines can make you feel dizzy. This can increase your chance of falling. Ask your doctor what other things that you can do to help prevent falls. This information is not intended to replace advice given to you by your health care provider. Make sure you discuss any questions you have with your health care provider. Document Released: 05/17/2009 Document Revised: 12/27/2015 Document Reviewed: 08/25/2014 Elsevier Interactive Patient Education  2017 [employer].

## 2020-07-04 DIAGNOSIS — R339 Retention of urine, unspecified: Secondary | ICD-10-CM | POA: Diagnosis not present

## 2020-07-04 NOTE — Progress Notes (Signed)
Note addendum created to include: Persons participating in the virtual visit: patient & caregiver, Seth Bake at [facility]/ this [place], [place]

## 2020-09-04 NOTE — Progress Notes (Signed)
Name: Wayne Jones   MRN: [id number]    DOB: 08/07/1960   Date:09/05/2020       Progress Note  Subjective  Chief Complaint  Follow Up, he is here today with caregiver - Phillips Odor   HPI  History of DM: his A1C has been normal for over one year without medication. He is on statin therapy No symptoms of hypoglycemia.  HTN: on beta-blocker and Ace, bp today is at goal, no chest pain and no recent episodes of palpitation Heart rate always low , but normal during auscultation today   BPH: heused to seeDr. Jacqlyn Larsen, he still has nocturia,he still masturbates often and causes penile irritation, but not problems at this time since he is not longer staying long hours at group home, he is back at the day programs and no complaints of balanitis . Now under the care of Dr. Shirlean Kelly  He is on Proscar and Flomax, last PSA was at goal   Dyslipidemia: taking statin and Lovaza.Reviewed labs, continue medications   MR: lives in a group, under [place] care, stable behavior, no wandering or aggression, sometimes acts out such as having a bowel movement on the floor when upset, but he cleans it up   Bipolar disorder: taking medication, but behavior has been stable, does not seem to be depressed or manic. He sees psychiatrist, Dr. Tamera Punt, and since he lives at a group home he is compliant with medications. He is off beta blocker because of bradycardia and doing well, no change in behavior noticed   Edema Lower extremities: he has chronic lower extremity edema, doing better on compression stocking hoses.Swelling is down , seems to be more active lately   Bradycardia: he was having symptomatic bradycardia, Inderal has been stopped by cardiologist and heart rate is normal today.  Thrombocytopenia: last level was below 100 and we will recheck it today   OA: seen by Ortho and seems to be doing okay, taking Meloxicam daily, he has a brace but only wearing prn   Morbid obesity: normal appetite,  lost 13 lbs since last visit in Nov, caregiver states he has been more active, usually walking and also back at the day program, appetite is normal we will monitor for now   Patient Active Problem List   Diagnosis Date Noted  . Osteoarthritis of knee 03/26/2020  . Varicose veins, lower extremity, with inflammation, ulcerated, unspecified laterality ([place]) 05/26/2017  . Bilateral lower extremity edema 04/14/2017  . Intellectual disability 03/17/2016  . Polypharmacy 01/10/2016  . Benign prostatic hypertrophy without urinary obstruction 01/09/2015  . Chronic kidney disease (CKD), stage I 01/09/2015  . Cognitive decline 01/09/2015  . History of diabetes mellitus, type II 01/09/2015  . Acid reflux 01/09/2015  . Hearing loss 01/09/2015  . HLD (hyperlipidemia) 01/09/2015  . Extreme obesity 01/09/2015  . Obstructive sleep apnea 01/09/2015  . Other specified causes of urethral stricture 01/09/2015  . Hernia of anterior abdominal wall 01/09/2015  . Incomplete bladder emptying 07/09/2012  . 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 Benign essential HTN 02/04/2007  . Bipolar I disorder, single manic episode, moderate ([place]) 02/04/2007    Past Surgical History:  Procedure Laterality Date  . LAPAROSCOPIC CHOLECYSTECTOMY    . LAPAROSCOPY  12/03/2010  . VENTRAL HERNIA REPAIR      Family History  Family history unknown: Yes    Social History   Tobacco Use  . Smoking status: Never Smoker  . Smokeless tobacco: Never Used  Substance Use Topics  . Alcohol use:  No    Alcohol/week: 0.0 standard drinks     Current Outpatient Medications:  .  ARIPiprazole (ABILIFY) 20 MG tablet, Take 20 mg by mouth daily., Disp: , Rfl:  .  atorvastatin (LIPITOR) 10 MG tablet, Take 1 tablet (10 mg total) by mouth daily., Disp: 30 tablet, Rfl: 10 .  carbamazepine (EQUETRO) 200 MG CP12 12 hr capsule, Take 1 capsule (200 mg total) by mouth 2 (two) times daily., Disp: 180 each, Rfl: 3 .  carbamide peroxide (DEBROX) 6.5  % OTIC solution, PLACE 5 DROPS INTO BOTH EARS TWO TIMES A DAY, Disp: 15 mL, Rfl: 0 .  Cholecalciferol (VITAMIN D3) 1.25 MG (50000 UT) CAPS, Take 1 capsule by mouth every 14 (fourteen) days., Disp: , Rfl:  .  clonazePAM (KLONOPIN) 1 MG tablet, Take 1 mg by mouth daily., Disp: , Rfl:  .  DENTA 5000 PLUS 1.1 % CREA dental cream, Take by mouth., Disp: , Rfl:  .  Elastic Bandages & Supports (MEDICAL COMPRESSION SOCKS) MISC, 2 each by Does not apply route daily. Apply in am's and remove it at bedtime, Disp: 2 each, Rfl: 1 .  famotidine (PEPCID) 40 MG tablet, TAKE ONE TABLET BY MOUTH EVERY DAY, Disp: 7 tablet, Rfl: 10 .  finasteride (PROSCAR) 5 MG tablet, Take 5 mg by mouth daily., Disp: , Rfl:  .  FLUoxetine (PROZAC) 20 MG capsule, TAKE 1 CAPSULE BY MOUTH DAILY, Disp: 7 capsule, Rfl: 10 .  gabapentin (NEURONTIN) 600 MG tablet, Take 600 mg by mouth at bedtime., Disp: , Rfl:  .  ketoconazole (NIZORAL) 2 % cream, Apply 1 application topically daily. Groin rash, Disp: 60 g, Rfl: 1 .  lisinopril (ZESTRIL) 10 MG tablet, TAKE 1 TABLET BY MOUTH ONCE DAILY, Disp: 30 tablet, Rfl: 5 .  loratadine (ALLERGY RELIEF) 10 MG tablet, TAKE (1) TABLET BY MOUTH DAILY FOR ALLERGY., Disp: 7 tablet, Rfl: 10 .  meloxicam (MOBIC) 15 MG tablet, Take 15 mg by mouth daily., Disp: , Rfl:  .  nystatin cream (MYCOSTATIN), APPLY TOPICALLY TWO TIMES A DAY, Disp: 30 g, Rfl: 10 .  omega-3 acid ethyl esters (LOVAZA) 1 g capsule, TAKE 1 CAPSULE BY MOUTH 2 TIMES DAILY, Disp: 62 capsule, Rfl: 10 .  omeprazole (PRILOSEC) 20 MG capsule, Take daily, Disp: 30 capsule, Rfl: 10 .  ondansetron (ZOFRAN) 4 MG tablet, TAKE 1 TABLET BY MOUTH EVERY EIGHT HOURS AS NEEDED FOR NAUSEA / VOMITING, Disp: 30 tablet, Rfl: 11 .  Polyethyl Glyc-Propyl Glyc PF (SYSTANE ULTRA PF) 0.4-0.3 % SOLN, Apply 1 each to eye daily., Disp: 30 each, Rfl: 11 .  promethazine (PHENERGAN) 25 MG tablet, Take 25 mg by mouth every 6 (six) hours as needed for nausea or vomiting., Disp:  , Rfl:  .  tamsulosin (FLOMAX) 0.4 MG CAPS capsule, Take 1 capsule (0.4 mg total) by mouth daily., Disp: 28 capsule, Rfl: 5 .  Vitamins A & D (VITAMIN A & D) ointment, Apply 1 application topically as needed for dry skin., Disp: 45 g, Rfl: 0 .  Alcohol Swabs (B-D SINGLE USE SWABS REGULAR) PADS, FINGERSTICK BLOOD SUGAR TEST ONCE DAILY IN THE MORNING PRIOR TO BREAKFAST. GLUCOSE GOAL 90-140 FASTING. IF>400 CALL MD (Patient not taking: No sig reported), Disp: 100 each, Rfl: PRN .  ASSURE LANCE LANCETS MISC, 1 each by Does not apply route daily. Fingerstick blood sugar test once daily in the morning prior to breakfast. Glucose Goal: 90-140 Fasting. If >400 call MD Controlled type 2 diabetes mellitus with microalbuminuria, without  long-term current use of insulin (HCC)  Dx: E11.29 (Patient not taking: No sig reported), Disp: 200 each, Rfl: 4 .  Blood Glucose Monitoring Suppl (TRUE METRIX METER) w/Device KIT, by Does not apply route. (Patient not taking: No sig reported), Disp: , Rfl:  .  ibuprofen (ADVIL,MOTRIN) 800 MG tablet, TAKE ONE TABLET BY MOUTH EVERY 8 HOURS AS NEEDED (Patient not taking: Reported on 09/05/2020), Disp: 30 tablet, Rfl: 10  No Known Allergies  I personally reviewed active problem list, medication list, allergies, family history, social history, health maintenance with the patient/caregiver today.   ROS  Ten systems reviewed and is negative except as mentioned in HPI   Objective  Vitals:   09/05/20 0857  BP: 132/78  Pulse: 86  Resp: 16  Temp: 97.6 F (36.4 C)  TempSrc: Oral  SpO2: 97%  Weight: 244 lb 1.6 oz (110.7 kg)  Height: 6' 2" (1.88 m)    Body mass index is 31.34 kg/m.  Physical Exam  Constitutional: Patient appears well-developed and well-nourished. Obese  No distress.  HEENT: head atraumatic, normocephalic, pupils equal and reactive to light, , neck supple, Cardiovascular: Normal rate, regular rhythm and normal heart sounds.  No murmur heard. No BLE  edema. Pulmonary/Chest: Effort normal and breath sounds normal. No respiratory distress. Abdominal: Soft.  There is no tenderness. Psychiatric: rocking back and forth. Cooperative, does not speak   PHQ2/9: Depression screen [facility] 2/9 09/05/2020 06/21/2020 04/03/2020 03/05/2020 07/22/2019  Decreased Interest 0 0 0 0 0  Down, Depressed, Hopeless 0 0 0 0 0  PHQ - 2 Score 0 0 0 0 0  Altered sleeping - - 0 0 0  Tired, decreased energy - - 0 0 0  Change in appetite - - 0 0 0  Feeling bad or failure about yourself  - - 0 0 0  Trouble concentrating - - 0 0 0  Moving slowly or fidgety/restless - - 0 0 0  Suicidal thoughts - - 0 0 0  PHQ-9 Score - - 0 0 0  Difficult doing work/chores - - - - -  Some recent data might be hidden    phq 9 is negative - he does not speak, but caregiver states behavior has been good    Fall Risk: Fall Risk  09/05/2020 06/21/2020 04/03/2020 04/03/2020 03/14/2020  Falls in the past year? 0 0 1 1 0  Number falls in past yr: 0 0 0 0 0  Injury with Fall? 0 0 1 1 0  Comment - - Right knee swoll - -  Risk for fall due to : - No Fall Risks - - -  Follow up - Falls prevention discussed - - -    Functional Status Survey: Is the patient deaf or have difficulty hearing?: No Does the patient have difficulty seeing, even when wearing glasses/contacts?: No Does the patient have difficulty concentrating, remembering, or making decisions?: No Does the patient have difficulty walking or climbing stairs?: No Does the patient have difficulty dressing or bathing?: No Does the patient have difficulty doing errands alone such as visiting a doctor's office or shopping?: No    Assessment & Plan  1. Bipolar I disorder, single manic episode, moderate (HCC)  Stable   2. History of diabetes mellitus, type II   3. Dyslipidemia  Continue Atorvastatin   4. Benign essential HTN  At goal today   5. BPH with obstruction/lower urinary tract symptoms  Stable   6. GERD without  esophagitis   7. History  of balanitis   8. Asymptomatic varicose veins of both lower extremities  No ulceration at this time  9. Morbid obesity, unspecified obesity type [facility])  Discussed with the patient the risk posed by an increased BMI. Discussed importance of portion control, calorie counting and at least 150 minutes of physical activity weekly. Avoid sweet beverages and drink more water. Eat at least 6 servings of fruit and vegetables daily   10. Thrombocytopenia ([place])  Recheck CBC

## 2020-09-05 ENCOUNTER — Other Ambulatory Visit: Payer: Self-pay

## 2020-09-05 ENCOUNTER — Encounter: Payer: Self-pay | Admitting: Family Medicine

## 2020-09-05 ENCOUNTER — Ambulatory Visit (INDEPENDENT_AMBULATORY_CARE_PROVIDER_SITE_OTHER): Payer: Medicare Other | Admitting: Family Medicine

## 2020-09-05 VITALS — BP 132/78 | HR 86 | Temp 97.6°F | Resp 16 | Ht 74.0 in | Wt 244.1 lb

## 2020-09-05 DIAGNOSIS — Z8639 Personal history of other endocrine, nutritional and metabolic disease: Secondary | ICD-10-CM

## 2020-09-05 DIAGNOSIS — K219 Gastro-esophageal reflux disease without esophagitis: Secondary | ICD-10-CM

## 2020-09-05 DIAGNOSIS — Z87438 Personal history of other diseases of male genital organs: Secondary | ICD-10-CM | POA: Diagnosis not present

## 2020-09-05 DIAGNOSIS — F3012 Manic episode without psychotic symptoms, moderate: Secondary | ICD-10-CM | POA: Diagnosis not present

## 2020-09-05 DIAGNOSIS — N138 Other obstructive and reflux uropathy: Secondary | ICD-10-CM | POA: Diagnosis not present

## 2020-09-05 DIAGNOSIS — I10 Essential (primary) hypertension: Secondary | ICD-10-CM | POA: Diagnosis not present

## 2020-09-05 DIAGNOSIS — E785 Hyperlipidemia, unspecified: Secondary | ICD-10-CM | POA: Diagnosis not present

## 2020-09-05 DIAGNOSIS — N401 Enlarged prostate with lower urinary tract symptoms: Secondary | ICD-10-CM

## 2020-09-05 DIAGNOSIS — D696 Thrombocytopenia, unspecified: Secondary | ICD-10-CM

## 2020-09-05 DIAGNOSIS — I8393 Asymptomatic varicose veins of bilateral lower extremities: Secondary | ICD-10-CM | POA: Diagnosis not present

## 2020-09-05 LAB — CBC
HCT: 39.6 % (ref 38.5–50.0)
Hemoglobin: 13.9 g/dL (ref 13.2–17.1)
MCH: 33.2 pg — ABNORMAL HIGH (ref 27.0–33.0)
MCHC: 35.1 g/dL (ref 32.0–36.0)
MCV: 94.5 fL (ref 80.0–100.0)
MPV: 11.6 fL (ref 7.5–12.5)
Platelets: 91 10*3/uL — ABNORMAL LOW (ref 140–400)
RBC: 4.19 10*6/uL — ABNORMAL LOW (ref 4.20–5.80)
RDW: 12 % (ref 11.0–15.0)
WBC: 4.6 10*3/uL (ref 3.8–10.8)

## 2020-09-05 MED ORDER — PROMETHAZINE HCL 25 MG PO TABS: 25.0000 mg | ORAL_TABLET | Freq: Four times a day (QID) | ORAL | 0 refills | Status: DC | PRN

## 2020-09-11 DIAGNOSIS — F3489 Other specified persistent mood disorders: Secondary | ICD-10-CM | POA: Diagnosis not present

## 2020-09-18 ENCOUNTER — Other Ambulatory Visit: Payer: Self-pay

## 2020-09-18 ENCOUNTER — Ambulatory Visit (INDEPENDENT_AMBULATORY_CARE_PROVIDER_SITE_OTHER): Payer: Medicare Other | Admitting: Cardiology

## 2020-09-18 ENCOUNTER — Encounter: Payer: Self-pay | Admitting: Cardiology

## 2020-09-18 VITALS — BP 136/88 | HR 71 | Ht 74.0 in | Wt 242.0 lb

## 2020-09-18 DIAGNOSIS — R001 Bradycardia, unspecified: Secondary | ICD-10-CM

## 2020-09-18 DIAGNOSIS — I10 Essential (primary) hypertension: Secondary | ICD-10-CM | POA: Diagnosis not present

## 2020-09-18 DIAGNOSIS — E7800 Pure hypercholesterolemia, unspecified: Secondary | ICD-10-CM | POA: Diagnosis not present

## 2020-09-18 NOTE — Patient Instructions (Signed)
Medication Instructions:  Your physician recommends that you continue on your current medications as directed. Please refer to the Current Medication list given to you today.  *If you need a refill on your cardiac medications before your next appointment, please call your pharmacy*   Lab Work: None ordered If you have labs (blood work) drawn today and your tests are completely normal, you will receive your results only by: Marland Kitchen MyChart Message (if you have MyChart) OR . A paper copy in the mail If you have any lab test that is abnormal or we need to change your treatment, we will call you to review the results.   Testing/Procedures: None    Follow-Up: At [facility] you and your health needs are our priority.  As part of our continuing mission to provide you with exceptional heart care, we have created designated Provider Care Teams.  These Care Teams include your primary Cardiologist (physician) and Advanced Practice Providers (APPs -  Physician Assistants and Nurse Practitioners) who all work together to provide you with the care you need, when you need it.  We recommend signing up for the patient portal called "MyChart".  Sign up information is provided on this After Visit Summary.  MyChart is used to connect with patients for Virtual Visits (Telemedicine).  Patients are able to view lab/test results, encounter notes, upcoming appointments, etc.  Non-urgent messages can be sent to your provider as well.   To learn more about what you can do with MyChart, go to [email or web address].    Your next appointment:   Follow up prn   The format for your next appointment:   In Person  Provider:   Kate Sable, MD   Other Instructions

## 2020-09-18 NOTE — Progress Notes (Signed)
Cardiology Office Note:    Date:  09/18/2020   ID:  [name], DOB 06/27/1961, MRN [id number]  PCP:  Steele Sizer, MD  [facility] Cardiologist:  No primary care provider on file.  [facility] Electrophysiologist:  None   Referring MD: Steele Sizer, MD   Chief Complaint  Patient presents with  . Follow-up    3 months     History of Present Illness:    Wayne Jones is a 60 y.o. male with a hx of diabetes, hyperlipidemia, hypertension, bipolar disorder who presents for follow-up.    Previously seen for asymptomatic bradycardia, Inderal was stopped.  Has no other concerns at this time, denies chest pain, shortness of breath, palpitations.  He feels fine his heart rate seems to have improved.  EMR/clinic visit on 09/05/2020 showed a heart rate of 86.    Past Medical History:  Diagnosis Date  . Depression   . Diabetes mellitus without complication ([place])   . Hyperlipidemia   . Hypertension   . Mental disorder     Past Surgical History:  Procedure Laterality Date  . LAPAROSCOPIC CHOLECYSTECTOMY    . LAPAROSCOPY  12/03/2010  . VENTRAL HERNIA REPAIR      Current Medications: Current Meds  Medication Sig  . ARIPiprazole (ABILIFY) 20 MG tablet Take 20 mg by mouth daily.  Marland Kitchen atorvastatin (LIPITOR) 10 MG tablet Take 1 tablet (10 mg total) by mouth daily.  . carbamazepine (EQUETRO) 200 MG CP12 12 hr capsule Take 1 capsule (200 mg total) by mouth 2 (two) times daily.  . carbamide peroxide (DEBROX) 6.5 % OTIC solution PLACE 5 DROPS INTO BOTH EARS TWO TIMES A DAY  . Cholecalciferol (VITAMIN D3) 1.25 MG (50000 UT) CAPS Take 1 capsule by mouth every 14 (fourteen) days.  . clonazePAM (KLONOPIN) 1 MG tablet Take 1 mg by mouth daily.  . DENTA 5000 PLUS 1.1 % CREA dental cream Take by mouth.  Water engineer Bandages & Supports (MEDICAL COMPRESSION SOCKS) MISC 2 each by Does not apply route daily. Apply in am's and remove it at bedtime  . famotidine (PEPCID) 40 MG tablet TAKE  ONE TABLET BY MOUTH EVERY DAY  . finasteride (PROSCAR) 5 MG tablet Take 5 mg by mouth daily.  Marland Kitchen FLUoxetine (PROZAC) 20 MG capsule TAKE 1 CAPSULE BY MOUTH DAILY  . gabapentin (NEURONTIN) 600 MG tablet Take 600 mg by mouth at bedtime.  Marland Kitchen ketoconazole (NIZORAL) 2 % cream Apply 1 application topically daily. Groin rash  . lisinopril (ZESTRIL) 10 MG tablet TAKE 1 TABLET BY MOUTH ONCE DAILY  . loratadine (ALLERGY RELIEF) 10 MG tablet TAKE (1) TABLET BY MOUTH DAILY FOR ALLERGY.  . meloxicam (MOBIC) 15 MG tablet Take 15 mg by mouth daily.  Marland Kitchen nystatin cream (MYCOSTATIN) APPLY TOPICALLY TWO TIMES A DAY  . omega-3 acid ethyl esters (LOVAZA) 1 g capsule TAKE 1 CAPSULE BY MOUTH 2 TIMES DAILY  . omeprazole (PRILOSEC) 20 MG capsule Take daily  . ondansetron (ZOFRAN) 4 MG tablet TAKE 1 TABLET BY MOUTH EVERY EIGHT HOURS AS NEEDED FOR NAUSEA / VOMITING  . Polyethyl Glyc-Propyl Glyc PF (SYSTANE ULTRA PF) 0.4-0.3 % SOLN Apply 1 each to eye daily.  . promethazine (PHENERGAN) 25 MG tablet Take 1 tablet (25 mg total) by mouth every 6 (six) hours as needed for nausea or vomiting. Discontinue medication , only take zofran prn  . tamsulosin (FLOMAX) 0.4 MG CAPS capsule Take 1 capsule (0.4 mg total) by mouth daily.  . Vitamins  A & D (VITAMIN A & D) ointment Apply 1 application topically as needed for dry skin.     Allergies:   Patient has no known allergies.   Social History   Socioeconomic History  . Marital status: Single    Spouse name: Not on file  . Number of children: Not on file  . Years of education: Not on file  . Highest education level: Not on file  Occupational History  . Occupation: disabled    Comment: Patient in care home  Tobacco Use  . Smoking status: Never Smoker  . Smokeless tobacco: Never Used  Vaping Use  . Vaping Use: Never used  Substance and Sexual Activity  . Alcohol use: No    Alcohol/week: 0.0 standard drinks  . Drug use: No  . Sexual activity: Never  Other Topics Concern  .  Not on file  Social History Narrative   [name] calls him twice weekly. Pt resides at [facility] group home at [employer]    Social Determinants of Health   Financial Resource Strain: Low Risk   . Difficulty of Paying Living Expenses: Not hard at all  Food Insecurity: No Food Insecurity  . Worried About Charity fundraiser in the Last Year: Never true  . Ran Out of Food in the Last Year: Never true  Transportation Needs: No Transportation Needs  . Lack of Transportation (Medical): No  . Lack of Transportation (Non-Medical): No  Physical Activity: Insufficiently Active  . Days of Exercise per Week: 7 days  . Minutes of Exercise per Session: 10 min  Stress: No Stress Concern Present  . Feeling of Stress : Not at all  Social Connections: Unknown  . Frequency of Communication with Friends and Family: Patient refused  . Frequency of Social Gatherings with Friends and Family: Patient refused  . Attends Religious Services: Patient refused  . Active Member of Clubs or Organizations: Patient refused  . Attends Archivist Meetings: Patient refused  . Marital Status: Never married     Family History: The patient's Family history is unknown by patient.  ROS:   Please see the history of present illness.     All other systems reviewed and are negative.  EKGs/Labs/Other Studies Reviewed:    The following studies were reviewed today:   EKG:  EKG not ordered today.   Recent Labs: 03/06/2020: ALT 15; BUN 12; Creat 0.81; Potassium 4.3; Sodium 137 09/05/2020: Hemoglobin 13.9; Platelets 91  Recent Lipid Panel    Component Value Date/Time   CHOL 114 03/06/2020 0808   TRIG 65 03/06/2020 0808   HDL 53 03/06/2020 0808   CHOLHDL 2.2 03/06/2020 0808   VLDL 20 07/18/2016 0823   LDLCALC 47 03/06/2020 0808     Risk Assessment/Calculations:      Physical Exam:    VS:  BP 136/88   Pulse 71   Ht 6\' 2"  (1.88 m)   Wt 242 lb (109.8 kg)   BMI 31.07 kg/m     Wt Readings  from Last 3 Encounters:  09/18/20 242 lb (109.8 kg)  09/05/20 244 lb 1.6 oz (110.7 kg)  06/18/20 251 lb 6 oz (114 kg)     GEN:  Well nourished, well developed in no acute distress HEENT: Normal NECK: No JVD; No carotid bruits LYMPHATICS: No lymphadenopathy CARDIAC: RRR, no murmurs, rubs, gallops RESPIRATORY:  Clear to auscultation without rales, wheezing or rhonchi  ABDOMEN: Soft, non-tender, non-distended MUSCULOSKELETAL:  No edema; No deformity  SKIN:  Warm and dry NEUROLOGIC:  Alert and oriented to person and place PSYCHIATRIC:  Normal affect   ASSESSMENT:    1. Bradycardia   2. Primary hypertension   3. Pure hypercholesterolemia    PLAN:    In order of problems listed above:  1. Previous history of asymptomatic bradycardia, heart rate has normalized since stopping Inderal.  Continue to monitor off beta-blockers. 2. History of hypertension, BP controlled.  Continue lisinopril. 3. History of hyperlipidemia, continue statin   Follow-up as needed   Medication Adjustments/Labs and Tests Ordered: Current medicines are reviewed at length with the patient today.  Concerns regarding medicines are outlined above.  No orders of the defined types were placed in this encounter.  No orders of the defined types were placed in this encounter.   Patient Instructions  Medication Instructions:  Your physician recommends that you continue on your current medications as directed. Please refer to the Current Medication list given to you today.  *If you need a refill on your cardiac medications before your next appointment, please call your pharmacy*   Lab Work: None ordered If you have labs (blood work) drawn today and your tests are completely normal, you will receive your results only by: Marland Kitchen MyChart Message (if you have MyChart) OR . A paper copy in the mail If you have any lab test that is abnormal or we need to change your treatment, we will call you to review the  results.   Testing/Procedures: None    Follow-Up: At [facility], you and your health needs are our priority.  As part of our continuing mission to provide you with exceptional heart care, we have created designated Provider Care Teams.  These Care Teams include your primary Cardiologist (physician) and Advanced Practice Providers (APPs -  Physician Assistants and Nurse Practitioners) who all work together to provide you with the care you need, when you need it.  We recommend signing up for the patient portal called "MyChart".  Sign up information is provided on this After Visit Summary.  MyChart is used to connect with patients for Virtual Visits (Telemedicine).  Patients are able to view lab/test results, encounter notes, upcoming appointments, etc.  Non-urgent messages can be sent to your provider as well.   To learn more about what you can do with MyChart, go to [email or web address].    Your next appointment:   Follow up prn   The format for your next appointment:   In Person  Provider:   Kate Sable, MD   Other Instructions      Signed, Kate Sable, MD  09/18/2020 10:48 AM    [place]

## 2020-10-02 ENCOUNTER — Ambulatory Visit: Payer: Medicare Other | Admitting: Family Medicine

## 2020-10-15 ENCOUNTER — Ambulatory Visit: Payer: Medicare Other | Admitting: Podiatry

## 2020-10-22 ENCOUNTER — Encounter: Payer: Self-pay | Admitting: Podiatry

## 2020-10-22 ENCOUNTER — Other Ambulatory Visit: Payer: Self-pay

## 2020-10-22 ENCOUNTER — Ambulatory Visit (INDEPENDENT_AMBULATORY_CARE_PROVIDER_SITE_OTHER): Payer: Medicare Other | Admitting: Podiatry

## 2020-10-22 DIAGNOSIS — E119 Type 2 diabetes mellitus without complications: Secondary | ICD-10-CM | POA: Diagnosis not present

## 2020-10-22 DIAGNOSIS — B351 Tinea unguium: Secondary | ICD-10-CM

## 2020-10-22 DIAGNOSIS — M79674 Pain in right toe(s): Secondary | ICD-10-CM

## 2020-10-22 DIAGNOSIS — M79675 Pain in left toe(s): Secondary | ICD-10-CM

## 2020-10-22 NOTE — Progress Notes (Signed)
This patient returns to my office for at risk foot care.  This patient requires this care by a professional since this patient will be at risk due to having diabetes and CKD..  This patient is unable to cut nails himself since the patient cannot reach his nails.These nails are painful walking and wearing shoes.  This patient presents for at risk foot care today.  General Appearance  Alert, conversant and in no acute stress.  Vascular  Dorsalis pedis and posterior tibial  pulses are palpable  bilaterally.  Capillary return is within normal limits  bilaterally. Temperature is within normal limits  bilaterally.  Neurologic  Senn-Weinstein monofilament wire test within normal limits  bilaterally. Muscle power within normal limits bilaterally.  Nails Thick disfigured discolored nails with subungual debris  from second  to fifth toes bilaterally. No evidence of bacterial infection or drainage bilaterally.  Orthopedic  No limitations of motion  feet .  No crepitus or effusions noted.  No bony pathology or digital deformities noted.  Skin  normotropic skin with no porokeratosis noted bilaterally.  No signs of infections or ulcers noted.     Onychomycosis  Pain in right toes  Pain in left toes  Consent was obtained for treatment procedures.   Mechanical debridement of nails 2-5  bilaterally performed with a nail nipper.  Filed with dremel without incident.    Return office visit   6 months                   Told patient to return for periodic foot care and evaluation due to potential at risk complications.   Brelee Renk DPM  

## 2020-11-08 ENCOUNTER — Other Ambulatory Visit: Payer: Self-pay | Admitting: Family Medicine

## 2020-11-08 DIAGNOSIS — N481 Balanitis: Secondary | ICD-10-CM

## 2020-11-12 DIAGNOSIS — E119 Type 2 diabetes mellitus without complications: Secondary | ICD-10-CM | POA: Diagnosis not present

## 2020-11-12 LAB — HM DIABETES EYE EXAM

## 2020-12-03 ENCOUNTER — Ambulatory Visit: Payer: Medicare Other | Admitting: Family Medicine

## 2020-12-03 NOTE — Progress Notes (Deleted)
Name: Wayne Jones   MRN: [id number]    DOB: [birth year]   Date:12/03/2020       Progress Note  Subjective  Chief Complaint  Genital Pain/ Discharge  HPI  ***  Patient Active Problem List   Diagnosis Date Noted  . Osteoarthritis of knee 03/26/2020  . Varicose veins, lower extremity, with inflammation, ulcerated, unspecified laterality ([place]) 05/26/2017  . Bilateral lower extremity edema 04/14/2017  . Thrombocytopenia ([place]) 07/20/2016  . Intellectual disability 03/17/2016  . Polypharmacy 01/10/2016  . Benign prostatic hypertrophy without urinary obstruction 01/09/2015  . Chronic kidney disease (CKD), stage I 01/09/2015  . Cognitive decline 01/09/2015  . History of diabetes mellitus, type II 01/09/2015  . Acid reflux 01/09/2015  . Hearing loss 01/09/2015  . HLD (hyperlipidemia) 01/09/2015  . Morbid obesity, unspecified obesity type ([place]) 01/09/2015  . Obstructive sleep apnea 01/09/2015  . Other specified causes of urethral stricture 01/09/2015  . Hernia of anterior abdominal wall 01/09/2015  . Incomplete bladder emptying 07/09/2012  . 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 Benign essential HTN 02/04/2007  . Bipolar I disorder, single manic episode, moderate ([place]) 02/04/2007    Past Surgical History:  Procedure Laterality Date  . LAPAROSCOPIC CHOLECYSTECTOMY    . LAPAROSCOPY  12/03/2010  . VENTRAL HERNIA REPAIR      Family History  Family history unknown: Yes    Social History   Tobacco Use  . Smoking status: Never Smoker  . Smokeless tobacco: Never Used  Substance Use Topics  . Alcohol use: No    Alcohol/week: 0.0 standard drinks     Current Outpatient Medications:  .  ARIPiprazole (ABILIFY) 20 MG tablet, Take 20 mg by mouth daily., Disp: , Rfl:  .  atorvastatin (LIPITOR) 10 MG tablet, Take 1 tablet (10 mg total) by mouth daily., Disp: 30 tablet, Rfl: 10 .  carbamazepine (EQUETRO) 200 MG CP12 12 hr capsule, Take 1 capsule (200 mg total) by mouth 2 (two) times  daily., Disp: 180 each, Rfl: 3 .  carbamide peroxide (DEBROX) 6.5 % OTIC solution, PLACE 5 DROPS INTO BOTH EARS TWO TIMES A DAY, Disp: 15 mL, Rfl: 0 .  Cholecalciferol (VITAMIN D3) 1.25 MG (50000 UT) CAPS, Take 1 capsule by mouth every 14 (fourteen) days., Disp: , Rfl:  .  clonazePAM (KLONOPIN) 1 MG tablet, Take 1 mg by mouth daily., Disp: , Rfl:  .  DENTA 5000 PLUS 1.1 % CREA dental cream, Take by mouth., Disp: , Rfl:  .  Elastic Bandages & Supports (MEDICAL COMPRESSION SOCKS) MISC, 2 each by Does not apply route daily. Apply in am's and remove it at bedtime, Disp: 2 each, Rfl: 1 .  famotidine (PEPCID) 40 MG tablet, TAKE ONE TABLET BY MOUTH EVERY DAY, Disp: 7 tablet, Rfl: 10 .  finasteride (PROSCAR) 5 MG tablet, Take 5 mg by mouth daily., Disp: , Rfl:  .  FLUoxetine (PROZAC) 20 MG capsule, TAKE 1 CAPSULE BY MOUTH DAILY, Disp: 7 capsule, Rfl: 10 .  gabapentin (NEURONTIN) 600 MG tablet, Take 600 mg by mouth at bedtime., Disp: , Rfl:  .  ketoconazole (NIZORAL) 2 % cream, Apply 1 application topically daily. Groin rash, Disp: 60 g, Rfl: 1 .  lisinopril (ZESTRIL) 10 MG tablet, TAKE 1 TABLET BY MOUTH ONCE DAILY, Disp: 30 tablet, Rfl: 5 .  loratadine (ALLERGY RELIEF) 10 MG tablet, TAKE (1) TABLET BY MOUTH DAILY FOR ALLERGY., Disp: 7 tablet, Rfl: 10 .  meloxicam (MOBIC) 15 MG tablet, Take 15 mg by mouth daily., Disp: ,  Rfl:  .  nystatin cream (MYCOSTATIN), APPLY TOPICALLY TWO TIMES A DAY, Disp: 30 g, Rfl: 10 .  omega-3 acid ethyl esters (LOVAZA) 1 g capsule, TAKE 1 CAPSULE BY MOUTH 2 TIMES DAILY, Disp: 62 capsule, Rfl: 10 .  omeprazole (PRILOSEC) 20 MG capsule, Take daily, Disp: 30 capsule, Rfl: 10 .  ondansetron (ZOFRAN) 4 MG tablet, TAKE 1 TABLET BY MOUTH EVERY EIGHT HOURS AS NEEDED FOR NAUSEA / VOMITING, Disp: 30 tablet, Rfl: 11 .  Polyethyl Glyc-Propyl Glyc PF (SYSTANE ULTRA PF) 0.4-0.3 % SOLN, Apply 1 each to eye daily., Disp: 30 each, Rfl: 11 .  promethazine (PHENERGAN) 25 MG tablet, Take 1 tablet  (25 mg total) by mouth every 6 (six) hours as needed for nausea or vomiting. Discontinue medication , only take zofran prn, Disp: 30 tablet, Rfl: 0 .  tamsulosin (FLOMAX) 0.4 MG CAPS capsule, Take 1 capsule (0.4 mg total) by mouth daily., Disp: 28 capsule, Rfl: 5 .  Vitamins A & D (VITAMIN A & D) ointment, APPLY TOPICALLY AS NEEDED FOR DRY SKIN, Disp: 113 g, Rfl: 10  No Known Allergies  I personally reviewed {Reviewed:[id number]} with the patient/caregiver today.   ROS  ***  Objective  There were no vitals filed for this visit.  There is no height or weight on file to calculate BMI.  Physical Exam ***  Recent Results (from the past 2160 hour(s))  CBC     Status: Abnormal   Collection Time: 09/05/20  9:51 AM  Result Value Ref Range   WBC 4.6 3.8 - 10.8 Thousand/uL   RBC 4.19 (L) 4.20 - 5.80 Million/uL   Hemoglobin 13.9 13.2 - 17.1 g/dL   HCT 39.6 38.5 - 50.0 %   MCV 94.5 80.0 - 100.0 fL   MCH 33.2 (H) 27.0 - 33.0 pg   MCHC 35.1 32.0 - 36.0 g/dL   RDW 12.0 11.0 - 15.0 %   Platelets 91 (L) 140 - 400 Thousand/uL   MPV 11.6 7.5 - 12.5 fL  HM DIABETES EYE EXAM     Status: None   Collection Time: 11/12/20 12:00 AM  Result Value Ref Range   HM Diabetic Eye Exam No Retinopathy No Retinopathy    Diabetic Foot Exam: Diabetic Foot Exam - Simple   No data filed    ***  PHQ2/9: Depression screen [facility] 2/9 09/05/2020 06/21/2020 04/03/2020 03/05/2020 07/22/2019  Decreased Interest 0 0 0 0 0  Down, Depressed, Hopeless 0 0 0 0 0  PHQ - 2 Score 0 0 0 0 0  Altered sleeping - - 0 0 0  Tired, decreased energy - - 0 0 0  Change in appetite - - 0 0 0  Feeling bad or failure about yourself  - - 0 0 0  Trouble concentrating - - 0 0 0  Moving slowly or fidgety/restless - - 0 0 0  Suicidal thoughts - - 0 0 0  PHQ-9 Score - - 0 0 0  Difficult doing work/chores - - - - -  Some recent data might be hidden    phq 9 is {gen pos [id number]} ***  Fall Risk: Fall Risk  09/05/2020 06/21/2020  04/03/2020 04/03/2020 03/14/2020  Falls in the past year? 0 0 1 1 0  Number falls in past yr: 0 0 0 0 0  Injury with Fall? 0 0 1 1 0  Comment - - Right knee swoll - -  Risk for fall due to : - No Fall Risks - - -  Follow up - Falls prevention discussed - - -   ***   Functional Status Survey:   ***   Assessment & Plan  *** There are no diagnoses linked to this encounter.

## 2020-12-03 NOTE — Progress Notes (Signed)
Name: Wayne Jones   MRN: [id number]    DOB: [birth year]   Date:12/04/2020       Progress Note  Subjective  Chief Complaint  Penis Pain/ Discharge  HPI  Caregiver from the group home gave most of the history  Initially she said he was having penile pain, he likes to masturbate multiple times a day and gets penile irritation. However during exam the foreskin was a little swollen but no lesions or drainage. Explained he likely needs lubrication to use while masturbating to avoid irritation and pain  She states he also has been refusing food at times, not normal for him, and sometimes just goes to his room. Just not his typical behavior . He has been afebrile, no fever . He vomited a few weeks ago. His weight is stable, no change in bowel movements. He has periods of constipation but does not have any prn medication to take. She thinks he may be a little depressed also but has follow up soon with psychiatrist.   Patient Active Problem List   Diagnosis Date Noted  . Osteoarthritis of knee 03/26/2020  . Varicose veins, lower extremity, with inflammation, ulcerated, unspecified laterality ([place]) 05/26/2017  . Bilateral lower extremity edema 04/14/2017  . Thrombocytopenia ([place]) 07/20/2016  . Intellectual disability 03/17/2016  . Polypharmacy 01/10/2016  . Benign prostatic hypertrophy without urinary obstruction 01/09/2015  . Chronic kidney disease (CKD), stage I 01/09/2015  . Cognitive decline 01/09/2015  . History of diabetes mellitus, type II 01/09/2015  . Acid reflux 01/09/2015  . Hearing loss 01/09/2015  . HLD (hyperlipidemia) 01/09/2015  . Morbid obesity, unspecified obesity type ([place]) 01/09/2015  . Obstructive sleep apnea 01/09/2015  . Other specified causes of urethral stricture 01/09/2015  . Hernia of anterior abdominal wall 01/09/2015  . Incomplete bladder emptying 07/09/2012  . 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 Benign essential HTN 02/04/2007  . Bipolar I disorder, single  manic episode, moderate ([place]) 02/04/2007    Past Surgical History:  Procedure Laterality Date  . LAPAROSCOPIC CHOLECYSTECTOMY    . LAPAROSCOPY  12/03/2010  . VENTRAL HERNIA REPAIR      Family History  Family history unknown: Yes    Social History   Tobacco Use  . Smoking status: Never Smoker  . Smokeless tobacco: Never Used  Substance Use Topics  . Alcohol use: No    Alcohol/week: 0.0 standard drinks     Current Outpatient Medications:  .  ARIPiprazole (ABILIFY) 20 MG tablet, Take 20 mg by mouth daily., Disp: , Rfl:  .  atorvastatin (LIPITOR) 10 MG tablet, Take 1 tablet (10 mg total) by mouth daily., Disp: 30 tablet, Rfl: 10 .  carbamazepine (EQUETRO) 200 MG CP12 12 hr capsule, Take 1 capsule (200 mg total) by mouth 2 (two) times daily., Disp: 180 each, Rfl: 3 .  carbamide peroxide (DEBROX) 6.5 % OTIC solution, PLACE 5 DROPS INTO BOTH EARS TWO TIMES A DAY, Disp: 15 mL, Rfl: 0 .  Cholecalciferol (VITAMIN D3) 1.25 MG (50000 UT) CAPS, Take 1 capsule by mouth every 14 (fourteen) days., Disp: , Rfl:  .  clonazePAM (KLONOPIN) 1 MG tablet, Take 1 mg by mouth daily., Disp: , Rfl:  .  DENTA 5000 PLUS 1.1 % CREA dental cream, Take by mouth., Disp: , Rfl:  .  Elastic Bandages & Supports (MEDICAL COMPRESSION SOCKS) MISC, 2 each by Does not apply route daily. Apply in am's and remove it at bedtime, Disp: 2 each, Rfl: 1 .  famotidine (PEPCID) 40 MG  tablet, TAKE ONE TABLET BY MOUTH EVERY DAY, Disp: 7 tablet, Rfl: 10 .  finasteride (PROSCAR) 5 MG tablet, Take 5 mg by mouth daily., Disp: , Rfl:  .  FLUoxetine (PROZAC) 20 MG capsule, TAKE 1 CAPSULE BY MOUTH DAILY, Disp: 7 capsule, Rfl: 10 .  gabapentin (NEURONTIN) 600 MG tablet, Take 600 mg by mouth at bedtime., Disp: , Rfl:  .  ketoconazole (NIZORAL) 2 % cream, Apply 1 application topically daily. Groin rash, Disp: 60 g, Rfl: 1 .  lisinopril (ZESTRIL) 10 MG tablet, TAKE 1 TABLET BY MOUTH ONCE DAILY, Disp: 30 tablet, Rfl: 5 .  loratadine  (ALLERGY RELIEF) 10 MG tablet, TAKE (1) TABLET BY MOUTH DAILY FOR ALLERGY., Disp: 7 tablet, Rfl: 10 .  meloxicam (MOBIC) 15 MG tablet, Take 15 mg by mouth daily., Disp: , Rfl:  .  nystatin cream (MYCOSTATIN), APPLY TOPICALLY TWO TIMES A DAY, Disp: 30 g, Rfl: 10 .  omega-3 acid ethyl esters (LOVAZA) 1 g capsule, TAKE 1 CAPSULE BY MOUTH 2 TIMES DAILY, Disp: 62 capsule, Rfl: 10 .  omeprazole (PRILOSEC) 20 MG capsule, Take daily, Disp: 30 capsule, Rfl: 10 .  ondansetron (ZOFRAN) 4 MG tablet, TAKE 1 TABLET BY MOUTH EVERY EIGHT HOURS AS NEEDED FOR NAUSEA / VOMITING, Disp: 30 tablet, Rfl: 11 .  Polyethyl Glyc-Propyl Glyc PF (SYSTANE ULTRA PF) 0.4-0.3 % SOLN, Apply 1 each to eye daily., Disp: 30 each, Rfl: 11 .  promethazine (PHENERGAN) 25 MG tablet, Take 1 tablet (25 mg total) by mouth every 6 (six) hours as needed for nausea or vomiting. Discontinue medication , only take zofran prn, Disp: 30 tablet, Rfl: 0 .  tamsulosin (FLOMAX) 0.4 MG CAPS capsule, Take 1 capsule (0.4 mg total) by mouth daily., Disp: 28 capsule, Rfl: 5 .  Vitamins A & D (VITAMIN A & D) ointment, APPLY TOPICALLY AS NEEDED FOR DRY SKIN, Disp: 113 g, Rfl: 10  No Known Allergies  I personally reviewed active problem list, medication list, allergies, family history, social history with the patient/caregiver today.   ROS  Constitutional: Negative for fever or weight change.  Respiratory: Negative for cough and shortness of breath.   Cardiovascular: Negative for chest pain or palpitations.  Gastrointestinal: positive  for abdominal pain, no bowel changes.  Musculoskeletal: Negative for gait problem or joint swelling.  Skin: positive for rash/stable on legs .  Neurological: Negative for dizziness or headache.  No other specific complaints in a complete review of systems (except as listed in HPI above).   Objective  Vitals:   12/04/20 1118  BP: 138/86  Pulse: 68  Resp: 16  Temp: 97.6 F (36.4 C)  TempSrc: Oral  SpO2: 99%   Weight: 242 lb (109.8 kg)  Height: 6\' 4"  (1.93 m)    Body mass index is 29.46 kg/m.  Physical Exam  Constitutional: Patient appears well-developed and well-nourished.  No distress.  HEENT: head atraumatic, normocephalic, pupils equal and reactive to light,neck supple Cardiovascular: Normal rate, regular rhythm and normal heart sounds.  No murmur heard. No BLE edema. Pulmonary/Chest: Effort normal and breath sounds normal. No respiratory distress. Abdominal: Soft.  There is tenderness epigastric and RUQ seems worse Genitourinary: normal testicles, he has circumcised penis , thick foreskin but no redness or rashes  Psychiatric: he is cooperative, apathy is stable, seems to follow simple commands, speaks very little and poor historian .  Recent Results (from the past 2160 hour(s))  HM DIABETES EYE EXAM     Status: None   Collection  Time: 11/12/20 12:00 AM  Result Value Ref Range   HM Diabetic Eye Exam No Retinopathy No Retinopathy     PHQ2/9: Depression screen [facility] 2/9 12/04/2020 09/05/2020 06/21/2020 04/03/2020 03/05/2020  Decreased Interest 0 0 0 0 0  Down, Depressed, Hopeless 0 0 0 0 0  PHQ - 2 Score 0 0 0 0 0  Altered sleeping - - - 0 0  Tired, decreased energy - - - 0 0  Change in appetite - - - 0 0  Feeling bad or failure about yourself  - - - 0 0  Trouble concentrating - - - 0 0  Moving slowly or fidgety/restless - - - 0 0  Suicidal thoughts - - - 0 0  PHQ-9 Score - - - 0 0  Difficult doing work/chores - - - - -  Some recent data might be hidden    phq 9 is negative  Fall Risk: Fall Risk  12/04/2020 09/05/2020 06/21/2020 04/03/2020 04/03/2020  Falls in the past year? 0 0 0 1 1  Number falls in past yr: 0 0 0 0 0  Injury with Fall? 0 0 0 1 1  Comment - - - Right knee swoll -  Risk for fall due to : - - No Fall Risks - -  Follow up - - Falls prevention discussed - -     Functional Status Survey: Is the patient deaf or have difficulty hearing?: No Does the patient have  difficulty seeing, even when wearing glasses/contacts?: No Does the patient have difficulty concentrating, remembering, or making decisions?: Yes Does the patient have difficulty walking or climbing stairs?: No Does the patient have difficulty dressing or bathing?: No Does the patient have difficulty doing errands alone such as visiting a doctor's office or shopping?: Yes    Assessment & Plan  1.Lack of appetite  - COMPLETE METABOLIC PANEL WITH GFR - Hepatitis panel, acute - Lipase - H. pylori breath test  2. Pain of upper abdomen  - CBC with Differential/Platelet - COMPLETE METABOLIC PANEL WITH GFR - Hepatitis panel, acute - Lipase - H. pylori breath test  3. Pain, penile  Needs to have access to lubrication to be able to masturbate  4. Thrombocytopenia ([facility])  - CBC with Differential/Platelet  5. Fatigue, unspecified type  - TSH  6. Vomiting, intractability of vomiting not specified, presence of nausea not specified, unspecified vomiting type  - COMPLETE METABOLIC PANEL WITH GFR - Hepatitis panel, acute - Lipase - H. pylori breath test  7. Other constipation  Try otc Miralax

## 2020-12-04 ENCOUNTER — Encounter: Payer: Self-pay | Admitting: Family Medicine

## 2020-12-04 ENCOUNTER — Other Ambulatory Visit: Payer: Self-pay

## 2020-12-04 ENCOUNTER — Ambulatory Visit (INDEPENDENT_AMBULATORY_CARE_PROVIDER_SITE_OTHER): Payer: Medicare Other | Admitting: Family Medicine

## 2020-12-04 VITALS — BP 138/86 | HR 68 | Temp 97.6°F | Resp 16 | Ht 76.0 in | Wt 242.0 lb

## 2020-12-04 DIAGNOSIS — R5383 Other fatigue: Secondary | ICD-10-CM

## 2020-12-04 DIAGNOSIS — R1110 Vomiting, unspecified: Secondary | ICD-10-CM | POA: Diagnosis not present

## 2020-12-04 DIAGNOSIS — K5909 Other constipation: Secondary | ICD-10-CM

## 2020-12-04 DIAGNOSIS — R630 Anorexia: Secondary | ICD-10-CM | POA: Diagnosis not present

## 2020-12-04 DIAGNOSIS — N4889 Other specified disorders of penis: Secondary | ICD-10-CM

## 2020-12-04 DIAGNOSIS — D696 Thrombocytopenia, unspecified: Secondary | ICD-10-CM | POA: Diagnosis not present

## 2020-12-04 DIAGNOSIS — R1010 Upper abdominal pain, unspecified: Secondary | ICD-10-CM | POA: Diagnosis not present

## 2020-12-05 ENCOUNTER — Other Ambulatory Visit: Payer: Self-pay | Admitting: Family Medicine

## 2020-12-05 DIAGNOSIS — D696 Thrombocytopenia, unspecified: Secondary | ICD-10-CM

## 2020-12-05 LAB — COMPLETE METABOLIC PANEL WITH GFR
AG Ratio: 2.1 (calc) (ref 1.0–2.5)
ALT: 16 U/L (ref 9–46)
AST: 18 U/L (ref 10–35)
Albumin: 3.9 g/dL (ref 3.6–5.1)
Alkaline phosphatase (APISO): 67 U/L (ref 35–144)
BUN: 15 mg/dL (ref 7–25)
CO2: 27 mmol/L (ref 20–32)
Calcium: 8.6 mg/dL (ref 8.6–10.3)
Chloride: 101 mmol/L (ref 98–110)
Creat: 0.85 mg/dL (ref 0.70–1.25)
GFR, Est African American: 110 mL/min/{1.73_m2} (ref >=60)
GFR, Est Non African American: 95 mL/min/{1.73_m2} (ref >=60)
Globulin: 1.9 g/dL (calc) (ref 1.9–3.7)
Glucose, Bld: 143 mg/dL — ABNORMAL HIGH (ref 65–99)
Potassium: 4.1 mmol/L (ref 3.5–5.3)
Sodium: 136 mmol/L (ref 135–146)
Total Bilirubin: 0.4 mg/dL (ref 0.2–1.2)
Total Protein: 5.8 g/dL — ABNORMAL LOW (ref 6.1–8.1)

## 2020-12-05 LAB — HEPATITIS PANEL, ACUTE
Hep A IgM: NONREACTIVE
Hep B C IgM: NONREACTIVE
Hepatitis B Surface Ag: NONREACTIVE
Hepatitis C Ab: NONREACTIVE
SIGNAL TO CUT-OFF: 0.02 (ref <1.00)

## 2020-12-05 LAB — CBC WITH DIFFERENTIAL/PLATELET
Absolute Monocytes: 379 cells/uL (ref 200–950)
Basophils Absolute: 19 cells/uL (ref 0–200)
Basophils Relative: 0.4 %
Eosinophils Absolute: 82 cells/uL (ref 15–500)
Eosinophils Relative: 1.7 %
HCT: 39.5 % (ref 38.5–50.0)
Hemoglobin: 13.6 g/dL (ref 13.2–17.1)
Lymphs Abs: 878 cells/uL (ref 850–3900)
MCH: 32.8 pg (ref 27.0–33.0)
MCHC: 34.4 g/dL (ref 32.0–36.0)
MCV: 95.2 fL (ref 80.0–100.0)
MPV: 10.9 fL (ref 7.5–12.5)
Monocytes Relative: 7.9 %
Neutro Abs: 3442 cells/uL (ref 1500–7800)
Neutrophils Relative %: 71.7 %
Platelets: 99 10*3/uL — ABNORMAL LOW (ref 140–400)
RBC: 4.15 10*6/uL — ABNORMAL LOW (ref 4.20–5.80)
RDW: 12.2 % (ref 11.0–15.0)
Total Lymphocyte: 18.3 %
WBC: 4.8 10*3/uL (ref 3.8–10.8)

## 2020-12-05 LAB — H. PYLORI BREATH TEST: H. pylori Breath Test: NOT DETECTED

## 2020-12-05 LAB — TSH: TSH: 0.56 mIU/L (ref 0.40–4.50)

## 2020-12-05 LAB — LIPASE: Lipase: 27 U/L (ref 7–60)

## 2020-12-11 DIAGNOSIS — F3489 Other specified persistent mood disorders: Secondary | ICD-10-CM | POA: Diagnosis not present

## 2020-12-17 ENCOUNTER — Inpatient Hospital Stay: Payer: Medicare Other | Attending: Oncology | Admitting: Oncology

## 2020-12-17 ENCOUNTER — Encounter: Payer: Self-pay | Admitting: Oncology

## 2020-12-17 ENCOUNTER — Inpatient Hospital Stay: Payer: Medicare Other

## 2020-12-17 VITALS — BP 127/62 | HR 67 | Temp 98.1°F | Resp 18 | Wt 244.1 lb

## 2020-12-17 DIAGNOSIS — D696 Thrombocytopenia, unspecified: Secondary | ICD-10-CM | POA: Diagnosis not present

## 2020-12-17 LAB — CBC WITH DIFFERENTIAL/PLATELET
Abs Immature Granulocytes: 0.01 10*3/uL (ref 0.00–0.07)
Basophils Absolute: 0 10*3/uL (ref 0.0–0.1)
Basophils Relative: 0 %
Eosinophils Absolute: 0.1 10*3/uL (ref 0.0–0.5)
Eosinophils Relative: 1 %
HCT: 39.1 % (ref 39.0–52.0)
Hemoglobin: 14.2 g/dL (ref 13.0–17.0)
Immature Granulocytes: 0 %
Lymphocytes Relative: 22 %
Lymphs Abs: 1 10*3/uL (ref 0.7–4.0)
MCH: 32.8 pg (ref 26.0–34.0)
MCHC: 36.3 g/dL — ABNORMAL HIGH (ref 30.0–36.0)
MCV: 90.3 fL (ref 80.0–100.0)
Monocytes Absolute: 0.4 10*3/uL (ref 0.1–1.0)
Monocytes Relative: 9 %
Neutro Abs: 3.2 10*3/uL (ref 1.7–7.7)
Neutrophils Relative %: 68 %
Platelets: 112 10*3/uL — ABNORMAL LOW (ref 150–400)
RBC: 4.33 MIL/uL (ref 4.22–5.81)
RDW: 12.5 % (ref 11.5–15.5)
WBC: 4.7 10*3/uL (ref 4.0–10.5)
nRBC: 0 % (ref 0.0–0.2)

## 2020-12-17 LAB — IMMATURE PLATELET FRACTION: Immature Platelet Fraction: 7.8 % (ref 1.2–8.6)

## 2020-12-17 LAB — PATHOLOGIST SMEAR REVIEW

## 2020-12-17 LAB — VITAMIN B12: Vitamin B-12: 254 pg/mL (ref 180–914)

## 2020-12-17 LAB — FOLATE: Folate: 15.5 ng/mL (ref >5.9)

## 2020-12-17 LAB — LACTATE DEHYDROGENASE: LDH: 124 U/L (ref 98–192)

## 2020-12-17 NOTE — Progress Notes (Signed)
Patient here to establish care for thrombocytopenia. Pt lives in [place] [facility] group home and is accompanied by caregiver today.

## 2020-12-17 NOTE — Progress Notes (Signed)
Hematology/Oncology Consult note Wayne Jones [phone] [phone]   Patient Care Team: Steele Sizer, MD as PCP - General (Family Medicine) Alvy Bimler, MD as Referring Physician (Psychiatry) Gardiner Barefoot, DPM as Consulting Physician (Podiatry) Delfin Edis, MD as Referring Physician (Urology)  REFERRING PROVIDER: Steele Sizer, MD  CHIEF COMPLAINTS/REASON FOR VISIT:  Evaluation of thrombocytopenia  HISTORY OF PRESENTING ILLNESS:  Wayne Jones is a 60 y.o. male who was seen in consultation at the request of Steele Sizer, MD for evaluation of thrombocytopenia   Reviewed patient's labs done previously.  12/04/2020 labs showed decreased platelet counts at 99,000.  Normal wbc  hemoglobin  Reviewed patient's previous labs. Thrombocytopenia is chronic chronic onset , since at least 2014 with baseline around 100,000. Patient has a history of mental disorders and lives in a group home.  He is a poor historian.  Denies any new complaints.  Denies any easy bleeding or bruising.  He was accompanied by staff member from the group home.  Staff denies any recent change of Abilify and carbamazepine.   Review of Systems  Unable to perform ROS: Other (Mental disorder)  Constitutional: Negative for appetite change, fatigue and unexpected weight change.  Respiratory: Negative for cough and shortness of breath.   Cardiovascular: Negative for chest pain.  Genitourinary: Negative for dysuria.   Hematological: Does not bruise/bleed easily.    MEDICAL HISTORY:  Past Medical History:  Diagnosis Date  . Depression   . Diabetes mellitus without complication ([place])   . Hyperlipidemia   . Hypertension   . Mental disorder     SURGICAL HISTORY: Past Surgical History:  Procedure Laterality Date  . LAPAROSCOPIC CHOLECYSTECTOMY    . LAPAROSCOPY  12/03/2010  . VENTRAL HERNIA REPAIR      SOCIAL HISTORY: Social History    Socioeconomic History  . Marital status: Single    Spouse name: Not on file  . Number of children: Not on file  . Years of education: Not on file  . Highest education level: Not on file  Occupational History  . Occupation: disabled    Comment: Patient in care home  Tobacco Use  . Smoking status: Never Smoker  . Smokeless tobacco: Never Used  Vaping Use  . Vaping Use: Never used  Substance and Sexual Activity  . Alcohol use: No    Alcohol/week: 0.0 standard drinks  . Drug use: No  . Sexual activity: Never  Other Topics Concern  . Not on file  Social History Narrative   [name] calls him twice weekly. Pt resides at [facility] group home at [facility]    Social Determinants of Health   Financial Resource Strain: Low Risk   . Difficulty of Paying Living Expenses: Not hard at all  Food Insecurity: No Food Insecurity  . Worried About Charity fundraiser in the Last Year: Never true  . Ran Out of Food in the Last Year: Never true  Transportation Needs: No Transportation Needs  . Lack of Transportation (Medical): No  . Lack of Transportation (Non-Medical): No  Physical Activity: Insufficiently Active  . Days of Exercise per Week: 7 days  . Minutes of Exercise per Session: 10 min  Stress: No Stress Concern Present  . Feeling of Stress : Not at all  Social Connections: Unknown  . Frequency of Communication with Friends and Family: Patient refused  . Frequency of Social Gatherings with Friends and Family: Patient refused  . Attends Religious Services: Patient refused  .  Active Member of Clubs or Organizations: Patient refused  . Attends Archivist Meetings: Patient refused  . Marital Status: Never married  Intimate Partner Violence: Not At Risk  . Fear of Current or Ex-Partner: No  . Emotionally Abused: No  . Physically Abused: No  . Sexually Abused: No    FAMILY HISTORY: Family History  Family history unknown: Yes    ALLERGIES:  has No Known  Allergies.  MEDICATIONS:  Current Outpatient Medications  Medication Sig Dispense Refill  . ARIPiprazole (ABILIFY) 20 MG tablet Take 20 mg by mouth daily.    Marland Kitchen atorvastatin (LIPITOR) 10 MG tablet Take 1 tablet (10 mg total) by mouth daily. 30 tablet 10  . carbamazepine (EQUETRO) 200 MG CP12 12 hr capsule Take 1 capsule (200 mg total) by mouth 2 (two) times daily. 180 each 3  . carbamide peroxide (DEBROX) 6.5 % OTIC solution PLACE 5 DROPS INTO BOTH EARS TWO TIMES A DAY 15 mL 0  . Cholecalciferol (VITAMIN D3) 1.25 MG (50000 UT) CAPS Take 1 capsule by mouth every 14 (fourteen) days.    . clonazePAM (KLONOPIN) 1 MG tablet Take 1 mg by mouth daily.    . DENTA 5000 PLUS 1.1 % CREA dental cream Take by mouth.    Water engineer Bandages & Supports (MEDICAL COMPRESSION SOCKS) MISC 2 each by Does not apply route daily. Apply in am's and remove it at bedtime 2 each 1  . famotidine (PEPCID) 40 MG tablet TAKE ONE TABLET BY MOUTH EVERY DAY 7 tablet 10  . finasteride (PROSCAR) 5 MG tablet Take 5 mg by mouth daily.    Marland Kitchen FLUoxetine (PROZAC) 20 MG capsule TAKE 1 CAPSULE BY MOUTH DAILY 7 capsule 10  . gabapentin (NEURONTIN) 600 MG tablet Take 600 mg by mouth at bedtime.    Marland Kitchen ketoconazole (NIZORAL) 2 % cream Apply 1 application topically daily. Groin rash 60 g 1  . lisinopril (ZESTRIL) 10 MG tablet TAKE 1 TABLET BY MOUTH ONCE DAILY 30 tablet 5  . loratadine (ALLERGY RELIEF) 10 MG tablet TAKE (1) TABLET BY MOUTH DAILY FOR ALLERGY. 7 tablet 10  . meloxicam (MOBIC) 15 MG tablet Take 15 mg by mouth daily.    Marland Kitchen nystatin cream (MYCOSTATIN) APPLY TOPICALLY TWO TIMES A DAY 30 g 10  . omega-3 acid ethyl esters (LOVAZA) 1 g capsule TAKE 1 CAPSULE BY MOUTH 2 TIMES DAILY 62 capsule 10  . omeprazole (PRILOSEC) 20 MG capsule Take daily 30 capsule 10  . Polyethyl Glyc-Propyl Glyc PF (SYSTANE ULTRA PF) 0.4-0.3 % SOLN Apply 1 each to eye daily. 30 each 11  . tamsulosin (FLOMAX) 0.4 MG CAPS capsule Take 1 capsule (0.4 mg total) by  mouth daily. 28 capsule 5  . ondansetron (ZOFRAN) 4 MG tablet TAKE 1 TABLET BY MOUTH EVERY EIGHT HOURS AS NEEDED FOR NAUSEA / VOMITING (Patient not taking: Reported on 12/17/2020) 30 tablet 11  . promethazine (PHENERGAN) 25 MG tablet Take 1 tablet (25 mg total) by mouth every 6 (six) hours as needed for nausea or vomiting. Discontinue medication , only take zofran prn (Patient not taking: Reported on 12/17/2020) 30 tablet 0  . Vitamins A & D (VITAMIN A & D) ointment APPLY TOPICALLY AS NEEDED FOR DRY SKIN (Patient not taking: Reported on 12/17/2020) 113 g 10   No current facility-administered medications for this visit.     PHYSICAL EXAMINATION: ECOG PERFORMANCE STATUS: 1 - Symptomatic but completely ambulatory Vitals:   12/17/20 1119  BP: 127/62  Pulse:  67  Resp: 18  Temp: 98.1 F (36.7 C)   Filed Weights   12/17/20 1119  Weight: 244 lb 1.6 oz (110.7 kg)    Physical Exam Constitutional:      General: He is not in acute distress. HENT:     Head: Normocephalic and atraumatic.  Eyes:     General: No scleral icterus. Cardiovascular:     Rate and Rhythm: Normal rate and regular rhythm.     Heart sounds: Normal heart sounds.  Pulmonary:     Effort: Pulmonary effort is normal. No respiratory distress.     Breath sounds: No wheezing.  Abdominal:     General: Bowel sounds are normal. There is no distension.     Palpations: Abdomen is soft.  Musculoskeletal:        General: No deformity. Normal range of motion.     Cervical back: Normal range of motion and neck supple.  Skin:    General: Skin is warm and dry.     Findings: No erythema or rash.  Neurological:     Mental Status: He is alert and oriented to person, place, and time. Mental status is at baseline.     Cranial Nerves: No cranial nerve deficit.     Coordination: Coordination normal.  Psychiatric:     Comments: Flat affect      LABORATORY DATA:  I have reviewed the data as listed Lab Results  Component Value Date    WBC 4.7 12/17/2020   HGB 14.2 12/17/2020   HCT 39.1 12/17/2020   MCV 90.3 12/17/2020   PLT 112 (L) 12/17/2020   Recent Labs    03/06/20 0808 12/04/20 1153  NA 137 136  K 4.3 4.1  CL 101 101  CO2 28 27  GLUCOSE 99 143*  BUN 12 15  CREATININE 0.81 0.85  CALCIUM 8.8 8.6  GFRNONAA 97 95  GFRAA 113 110  PROT 6.3 5.8*  AST 15 18  ALT 15 16  BILITOT 0.8 0.4   Iron/TIBC/Ferritin/ %Sat No results found for: IRON, TIBC, FERRITIN, IRONPCTSAT    RADIOGRAPHIC STUDIES: I have personally reviewed the radiological images as listed and agreed with the findings in the report.  No results found.   ASSESSMENT & PLAN:  1. Thrombocytopenia ([place])    For the work up of patient's thrombocytopenia, I recommend checking CBC;CMP, LDH; smear review, folate, Vitamin B12, hepatitis panel flowcytometry and monoclonal gammopathy workup. . # Patient follow-up with me in approximately 2 weeks to review the above results.   Most likely thrombocytopenia secondary to carbamazepine/Abilify.  Rule out other etiologies.  Patient will follow-up in 2 weeks to discuss results.   Orders Placed This Encounter  Procedures  . CBC with Differential/Platelet    Standing Status:   Future    Number of Occurrences:   1    Standing Expiration Date:   12/17/2021  . Pathologist smear review    Standing Status:   Future    Number of Occurrences:   1    Standing Expiration Date:   12/17/2021  . Lactate dehydrogenase    Standing Status:   Future    Number of Occurrences:   1    Standing Expiration Date:   12/17/2021  . Flow cytometry panel-leukemia/lymphoma work-up    Standing Status:   Future    Number of Occurrences:   1    Standing Expiration Date:   12/17/2021  . Vitamin B12    Standing Status:   Future  Number of Occurrences:   1    Standing Expiration Date:   12/17/2021  . Folate    Standing Status:   Future    Number of Occurrences:   1    Standing Expiration Date:   12/17/2021  . Immature Platelet  Fraction    Standing Status:   Future    Number of Occurrences:   1    Standing Expiration Date:   12/17/2021  . Protein electrophoresis, serum    Standing Status:   Future    Number of Occurrences:   1    Standing Expiration Date:   12/17/2021    All questions were answered. The patient knows to call the clinic with any problems questions or concerns.  Cc Steele Sizer, MD  Return of visit: 2 weeks.  Thank you for this kind referral and the opportunity to participate in the care of this patient. A copy of today's note is routed to referring provider    Earlie Server, MD, PhD 12/17/2020

## 2020-12-19 LAB — PROTEIN ELECTROPHORESIS, SERUM
A/G Ratio: 1.8 — ABNORMAL HIGH (ref 0.7–1.7)
Albumin ELP: 4.1 g/dL (ref 2.9–4.4)
Alpha-1-Globulin: 0.2 g/dL (ref 0.0–0.4)
Alpha-2-Globulin: 0.4 g/dL (ref 0.4–1.0)
Beta Globulin: 0.8 g/dL (ref 0.7–1.3)
Gamma Globulin: 0.8 g/dL (ref 0.4–1.8)
Globulin, Total: 2.3 g/dL (ref 2.2–3.9)
Total Protein ELP: 6.4 g/dL (ref 6.0–8.5)

## 2020-12-19 LAB — COMP PANEL: LEUKEMIA/LYMPHOMA

## 2021-01-02 ENCOUNTER — Other Ambulatory Visit: Payer: Self-pay

## 2021-01-02 ENCOUNTER — Encounter: Payer: Self-pay | Admitting: Oncology

## 2021-01-02 ENCOUNTER — Inpatient Hospital Stay: Payer: Medicare Other | Attending: Oncology | Admitting: Oncology

## 2021-01-02 VITALS — BP 130/77 | HR 60 | Temp 97.4°F | Resp 16 | Wt 246.4 lb

## 2021-01-02 DIAGNOSIS — Z79899 Other long term (current) drug therapy: Secondary | ICD-10-CM | POA: Diagnosis not present

## 2021-01-02 DIAGNOSIS — D696 Thrombocytopenia, unspecified: Secondary | ICD-10-CM | POA: Insufficient documentation

## 2021-01-02 NOTE — Progress Notes (Signed)
Patient denies new problems/concerns today.   °

## 2021-01-02 NOTE — Progress Notes (Signed)
Hematology/Oncology Consult note [facility] [phone] [phone]   Patient Care Team: Steele Sizer, MD as PCP - General (Family Medicine) Alvy Bimler, MD as Referring Physician (Psychiatry) Gardiner Barefoot, DPM as Consulting Physician (Podiatry) Delfin Edis, MD as Referring Physician (Urology)  REFERRING PROVIDER: Steele Sizer, MD  CHIEF COMPLAINTS/REASON FOR VISIT:  Evaluation of thrombocytopenia  HISTORY OF PRESENTING ILLNESS:  Wayne Jones is a 60 y.o. male who was seen in consultation at the request of Steele Sizer, MD for evaluation of thrombocytopenia   Reviewed patient's labs done previously.  12/04/2020 labs showed decreased platelet counts at 99,000.  Normal wbc  hemoglobin  Reviewed patient's previous labs. Thrombocytopenia is chronic chronic onset , since at least 2014 with baseline around 100,000. Patient has a history of mental disorders and lives in a group home.  He is a poor historian.  Denies any new complaints.  Denies any easy bleeding or bruising.  He was accompanied by staff member from the group home.  Staff denies any recent change of Abilify and carbamazepine.  INTERVAL HISTORY Wayne Jones is a 60 y.o. male who has above history reviewed by me today presents for follow up visit for management of thrombocytopenia Patient was accompanied by group home staff.  No new complaint.  He has had blood work done and present to discuss results.   Review of Systems  Unable to perform ROS: Other (Mental disorder)  Constitutional: Negative for appetite change, fatigue and unexpected weight change.  Respiratory: Negative for cough and shortness of breath.   Cardiovascular: Negative for chest pain.  Genitourinary: Negative for dysuria.   Hematological: Does not bruise/bleed easily.    MEDICAL HISTORY:  Past Medical History:  Diagnosis Date  . Depression   . Diabetes mellitus without  complication ([place])   . Hyperlipidemia   . Hypertension   . Mental disorder     SURGICAL HISTORY: Past Surgical History:  Procedure Laterality Date  . LAPAROSCOPIC CHOLECYSTECTOMY    . LAPAROSCOPY  12/03/2010  . VENTRAL HERNIA REPAIR      SOCIAL HISTORY: Social History   Socioeconomic History  . Marital status: Single    Spouse name: Not on file  . Number of children: Not on file  . Years of education: Not on file  . Highest education level: Not on file  Occupational History  . Occupation: disabled    Comment: Patient in care home  Tobacco Use  . Smoking status: Never Smoker  . Smokeless tobacco: Never Used  Vaping Use  . Vaping Use: Never used  Substance and Sexual Activity  . Alcohol use: No    Alcohol/week: 0.0 standard drinks  . Drug use: No  . Sexual activity: Never  Other Topics Concern  . Not on file  Social History Narrative   [name] calls him twice weekly. Pt resides at [facility] group home at [facility]    Social Determinants of Health   Financial Resource Strain: Low Risk   . Difficulty of Paying Living Expenses: Not hard at all  Food Insecurity: No Food Insecurity  . Worried About Charity fundraiser in the Last Year: Never true  . Ran Out of Food in the Last Year: Never true  Transportation Needs: No Transportation Needs  . Lack of Transportation (Medical): No  . Lack of Transportation (Non-Medical): No  Physical Activity: Insufficiently Active  . Days of Exercise per Week: 7 days  . Minutes of Exercise per Session:  10 min  Stress: No Stress Concern Present  . Feeling of Stress : Not at all  Social Connections: Unknown  . Frequency of Communication with Friends and Family: Patient refused  . Frequency of Social Gatherings with Friends and Family: Patient refused  . Attends Religious Services: Patient refused  . Active Member of Clubs or Organizations: Patient refused  . Attends Archivist Meetings: Patient refused  . Marital  Status: Never married  Intimate Partner Violence: Not At Risk  . Fear of Current or Ex-Partner: No  . Emotionally Abused: No  . Physically Abused: No  . Sexually Abused: No    FAMILY HISTORY: Family History  Family history unknown: Yes    ALLERGIES:  has No Known Allergies.  MEDICATIONS:  Current Outpatient Medications  Medication Sig Dispense Refill  . ARIPiprazole (ABILIFY) 20 MG tablet Take 20 mg by mouth daily.    Marland Kitchen atorvastatin (LIPITOR) 10 MG tablet Take 1 tablet (10 mg total) by mouth daily. 30 tablet 10  . carbamazepine (EQUETRO) 200 MG CP12 12 hr capsule Take 1 capsule (200 mg total) by mouth 2 (two) times daily. 180 each 3  . carbamide peroxide (DEBROX) 6.5 % OTIC solution PLACE 5 DROPS INTO BOTH EARS TWO TIMES A DAY 15 mL 0  . Cholecalciferol (VITAMIN D3) 1.25 MG (50000 UT) CAPS Take 1 capsule by mouth every 14 (fourteen) days.    . clonazePAM (KLONOPIN) 1 MG tablet Take 1 mg by mouth daily.    . DENTA 5000 PLUS 1.1 % CREA dental cream Take by mouth.    Water engineer Bandages & Supports (MEDICAL COMPRESSION SOCKS) MISC 2 each by Does not apply route daily. Apply in am's and remove it at bedtime 2 each 1  . famotidine (PEPCID) 40 MG tablet TAKE ONE TABLET BY MOUTH EVERY DAY 7 tablet 10  . finasteride (PROSCAR) 5 MG tablet Take 5 mg by mouth daily.    Marland Kitchen FLUoxetine (PROZAC) 20 MG capsule TAKE 1 CAPSULE BY MOUTH DAILY 7 capsule 10  . gabapentin (NEURONTIN) 600 MG tablet Take 600 mg by mouth at bedtime.    Marland Kitchen ketoconazole (NIZORAL) 2 % cream Apply 1 application topically daily. Groin rash 60 g 1  . lisinopril (ZESTRIL) 10 MG tablet TAKE 1 TABLET BY MOUTH ONCE DAILY 30 tablet 5  . loratadine (ALLERGY RELIEF) 10 MG tablet TAKE (1) TABLET BY MOUTH DAILY FOR ALLERGY. 7 tablet 10  . meloxicam (MOBIC) 15 MG tablet Take 15 mg by mouth daily.    Marland Kitchen nystatin cream (MYCOSTATIN) APPLY TOPICALLY TWO TIMES A DAY 30 g 10  . omega-3 acid ethyl esters (LOVAZA) 1 g capsule TAKE 1 CAPSULE BY MOUTH 2  TIMES DAILY 62 capsule 10  . omeprazole (PRILOSEC) 20 MG capsule Take daily 30 capsule 10  . ondansetron (ZOFRAN) 4 MG tablet TAKE 1 TABLET BY MOUTH EVERY EIGHT HOURS AS NEEDED FOR NAUSEA / VOMITING 30 tablet 11  . Polyethyl Glyc-Propyl Glyc PF (SYSTANE ULTRA PF) 0.4-0.3 % SOLN Apply 1 each to eye daily. 30 each 11  . tamsulosin (FLOMAX) 0.4 MG CAPS capsule Take 1 capsule (0.4 mg total) by mouth daily. 28 capsule 5  . Vitamins A & D (VITAMIN A & D) ointment APPLY TOPICALLY AS NEEDED FOR DRY SKIN 113 g 10  . promethazine (PHENERGAN) 25 MG tablet Take 1 tablet (25 mg total) by mouth every 6 (six) hours as needed for nausea or vomiting. Discontinue medication , only take zofran prn (Patient not  taking: No sig reported) 30 tablet 0   No current facility-administered medications for this visit.     PHYSICAL EXAMINATION: ECOG PERFORMANCE STATUS: 1 - Symptomatic but completely ambulatory Vitals:   01/02/21 1020  BP: 130/77  Pulse: 60  Resp: 16  Temp: (!) 97.4 F (36.3 C)   Filed Weights   01/02/21 1020  Weight: 246 lb 6.4 oz (111.8 kg)    Physical Exam Constitutional:      General: He is not in acute distress. HENT:     Head: Normocephalic and atraumatic.  Eyes:     General: No scleral icterus. Cardiovascular:     Rate and Rhythm: Normal rate and regular rhythm.     Heart sounds: Normal heart sounds.  Pulmonary:     Effort: Pulmonary effort is normal. No respiratory distress.     Breath sounds: No wheezing.  Abdominal:     General: Bowel sounds are normal. There is no distension.     Palpations: Abdomen is soft.  Musculoskeletal:        General: No deformity. Normal range of motion.     Cervical back: Normal range of motion and neck supple.  Skin:    General: Skin is warm and dry.     Findings: No erythema or rash.  Neurological:     Mental Status: He is alert and oriented to person, place, and time. Mental status is at baseline.     Cranial Nerves: No cranial nerve  deficit.     Coordination: Coordination normal.  Psychiatric:     Comments: Flat affect      LABORATORY DATA:  I have reviewed the data as listed Lab Results  Component Value Date   WBC 4.7 12/17/2020   HGB 14.2 12/17/2020   HCT 39.1 12/17/2020   MCV 90.3 12/17/2020   PLT 112 (L) 12/17/2020   Recent Labs    03/06/20 0808 12/04/20 1153  NA 137 136  K 4.3 4.1  CL 101 101  CO2 28 27  GLUCOSE 99 143*  BUN 12 15  CREATININE 0.81 0.85  CALCIUM 8.8 8.6  GFRNONAA 97 95  GFRAA 113 110  PROT 6.3 5.8*  AST 15 18  ALT 15 16  BILITOT 0.8 0.4   Iron/TIBC/Ferritin/ %Sat No results found for: IRON, TIBC, FERRITIN, IRONPCTSAT    RADIOGRAPHIC STUDIES: I have personally reviewed the radiological images as listed and agreed with the findings in the report.  No results found.   ASSESSMENT & PLAN:  1. Thrombocytopenia ([place])    Labs are reviewed and discussed with patient.  Thrombocytopenia work-up is negative. CBC showed mild thrombocytopenia with a platelet count of 1 12,000 which is close to his chronic baseline.  I will hold off additional work-up at this point. Most likely thrombocytopenia secondary to carbamazepine/Abilify.. Follow-up in 6 months.  If stable, consider follow-up annually.  Orders Placed This Encounter  Procedures  . CBC with Differential/Platelet    Standing Status:   Future    Standing Expiration Date:   01/02/2022  . Comprehensive metabolic panel    Standing Status:   Future    Standing Expiration Date:   01/02/2022  . Lactate dehydrogenase    Standing Status:   Future    Standing Expiration Date:   01/02/2022  . Technologist smear review    Standing Status:   Future    Standing Expiration Date:   01/02/2022    All questions were answered. The patient knows to call the clinic with  any problems questions or concerns.  Cc Steele Sizer, MD  Return of visit: 6 months    Earlie Server, MD, PhD 01/02/2021

## 2021-01-16 DIAGNOSIS — Z23 Encounter for immunization: Secondary | ICD-10-CM | POA: Diagnosis not present

## 2021-01-28 ENCOUNTER — Other Ambulatory Visit: Payer: Self-pay | Admitting: Family Medicine

## 2021-03-05 NOTE — Progress Notes (Signed)
Name: Wayne Jones   MRN: [id number]    DOB: 1960-09-26   Date:03/06/2021       Progress Note  Subjective  Chief Complaint  Follow Up  HPI  History of DM: his A1C has been normal for over one year without medication.   He is on statin therapy No symptoms of hypoglycemia. He has lost a lot of weight.    HTN: he is off beta blocker, Ace, bp today is at goal, no chest pain and no recent episodes of palpitation as far as staff can tell    BPH: he used to see  Dr. Jacqlyn Larsen, he still has nocturia, he still masturbates often and causes penile irritation, but not problems at this time since he is not longer staying long hours at group home, he is back at the day programs and no complaints of balanitis . Now under the care of Dr. Shirlean Kelly  He is on Proscar and Flomax, last PSA was at goal    Dyslipidemia: taking statin and Lovaza. Unchanged    MR: lives in a group, under [employer] care, stable behavior, no wandering or aggression, he has very seldom episodes of throwing up when upset, he can break things but he cleans up after himself.  Not aggressive towards anyone, he actually protects male staff when another person acting out towards them.   Bipolar disorder: taking medication,  but behavior has been stable, does not seem to be depressed or manic. He sees psychiatrist, Dr. Tamera Punt, and since he lives at a group home he is compliant with medications . He continues to masturbate but has been using a lotion, vaseline, his penis sometimes gets raw and sometimes hurts. Reassurance given    Edema Lower extremities: he has chronic lower extremity edema, doing better on compression stocking hoses. Swelling is mild    Bradycardia: he was having symptomatic bradycardia, Inderal has been stopped by cardiologist and heart rate is normal todayat 73   Thrombocytopenia: seen by Dr. Tasia Catchings and so far negative work up    OA: seen by Ortho and seems to be doing okay, taking Meloxicam daily, he has a brace but  only wearing prn Discussed considering using Meloxicam prn.   Morbid obesity: normal appetite, lost 24 lbs in the past year, he lost 6 lbs since last visit. Caregiver states he seems to have an normal appetite. His wait was in the 260 lbs back in 2019, 250 lbs in 2020's and is losing more. Discussed malnutrition, we will add protein shakes once a day to maintain his wait. We checked for h. Pylori, acute hepatitis panel, lipase, TSH, comp panel and CBC. Only abnormal finding is persistent low platelets. Seen by Dr. Tasia Catchings and negative work up so far   Patient Active Problem List   Diagnosis Date Noted   Osteoarthritis of knee 03/26/2020   Varicose veins, lower extremity, with inflammation, ulcerated, unspecified laterality ([place]) 05/26/2017   Bilateral lower extremity edema 04/14/2017   Thrombocytopenia ([place]) 07/20/2016   Intellectual disability 03/17/2016   Polypharmacy 01/10/2016   Benign prostatic hypertrophy without urinary obstruction 01/09/2015   Chronic kidney disease (CKD), stage I 01/09/2015   Cognitive decline 01/09/2015   History of diabetes mellitus, type II 01/09/2015   Acid reflux 01/09/2015   Hearing loss 01/09/2015   HLD (hyperlipidemia) 01/09/2015   Morbid obesity, unspecified obesity type ([place]) 01/09/2015   Obstructive sleep apnea 01/09/2015   Other specified causes of urethral stricture 01/09/2015   Hernia of anterior abdominal wall  01/09/2015   Incomplete bladder emptying 07/09/2012   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   Benign essential HTN 02/04/2007   Bipolar I disorder, single manic episode, moderate ([place]) 02/04/2007    Past Surgical History:  Procedure Laterality Date   LAPAROSCOPIC CHOLECYSTECTOMY     LAPAROSCOPY  12/03/2010   VENTRAL HERNIA REPAIR      Family History  Family history unknown: Yes    Social History   Tobacco Use   Smoking status: Never   Smokeless tobacco: Never  Substance Use Topics   Alcohol use: No    Alcohol/week: 0.0 standard  drinks     Current Outpatient Medications:    ARIPiprazole (ABILIFY) 20 MG tablet, Take 20 mg by mouth daily., Disp: , Rfl:    atorvastatin (LIPITOR) 10 MG tablet, Take 1 tablet (10 mg total) by mouth daily., Disp: 30 tablet, Rfl: 10   carbamazepine (EQUETRO) 200 MG CP12 12 hr capsule, Take 1 capsule (200 mg total) by mouth 2 (two) times daily., Disp: 180 each, Rfl: 3   carbamide peroxide (DEBROX) 6.5 % OTIC solution, PLACE 5 DROPS INTO BOTH EARS TWO TIMES A DAY, Disp: 15 mL, Rfl: 0   Cholecalciferol (VITAMIN D3) 1.25 MG (50000 UT) CAPS, Take 1 capsule by mouth every 14 (fourteen) days., Disp: , Rfl:    clonazePAM (KLONOPIN) 1 MG tablet, Take 1 mg by mouth daily., Disp: , Rfl:    DENTA 5000 PLUS 1.1 % CREA dental cream, Take by mouth., Disp: , Rfl:    Elastic Bandages & Supports (MEDICAL COMPRESSION SOCKS) MISC, 2 each by Does not apply route daily. Apply in am's and remove it at bedtime, Disp: 2 each, Rfl: 1   famotidine (PEPCID) 40 MG tablet, TAKE ONE TABLET BY MOUTH EVERY DAY, Disp: 7 tablet, Rfl: 10   finasteride (PROSCAR) 5 MG tablet, Take 5 mg by mouth daily., Disp: , Rfl:    FLUoxetine (PROZAC) 20 MG capsule, TAKE 1 CAPSULE BY MOUTH DAILY, Disp: 7 capsule, Rfl: 10   gabapentin (NEURONTIN) 600 MG tablet, Take 600 mg by mouth at bedtime., Disp: , Rfl:    ketoconazole (NIZORAL) 2 % cream, Apply 1 application topically daily. Groin rash, Disp: 60 g, Rfl: 1   lisinopril (ZESTRIL) 10 MG tablet, TAKE 1 TABLET BY MOUTH ONCE DAILY, Disp: 30 tablet, Rfl: 5   loratadine (ALLERGY RELIEF) 10 MG tablet, TAKE (1) TABLET BY MOUTH DAILY FOR ALLERGY., Disp: 7 tablet, Rfl: 10   Lubricants (K-Y JELLY) GEL, USE  AS NEEDED TO BE ABLE TO MASTURBATE, Disp: 57 g, Rfl: 10   meloxicam (MOBIC) 15 MG tablet, Take 15 mg by mouth daily., Disp: , Rfl:    nystatin cream (MYCOSTATIN), APPLY TOPICALLY TWO TIMES A DAY, Disp: 30 g, Rfl: 10   omega-3 acid ethyl esters (LOVAZA) 1 g capsule, TAKE 1 CAPSULE BY MOUTH 2 TIMES  DAILY, Disp: 62 capsule, Rfl: 10   omeprazole (PRILOSEC) 20 MG capsule, Take daily, Disp: 30 capsule, Rfl: 10   ondansetron (ZOFRAN) 4 MG tablet, TAKE 1 TABLET BY MOUTH EVERY EIGHT HOURS AS NEEDED FOR NAUSEA / VOMITING, Disp: 30 tablet, Rfl: 11   Polyethyl Glyc-Propyl Glyc PF (SYSTANE ULTRA PF) 0.4-0.3 % SOLN, Apply 1 each to eye daily., Disp: 30 each, Rfl: 11   promethazine (PHENERGAN) 25 MG tablet, Take 1 tablet (25 mg total) by mouth every 6 (six) hours as needed for nausea or vomiting. Discontinue medication , only take zofran prn, Disp: 30 tablet, Rfl: 0   tamsulosin (FLOMAX)  0.4 MG CAPS capsule, Take 1 capsule (0.4 mg total) by mouth daily., Disp: 28 capsule, Rfl: 5   Vitamins A & D (VITAMIN A & D) ointment, APPLY TOPICALLY AS NEEDED FOR DRY SKIN, Disp: 113 g, Rfl: 10  No Known Allergies  I personally reviewed active problem list, medication list, allergies, family history, social history, health maintenance, notes from last encounter with the patient/caregiver today.   ROS  Constitutional: Negative for fever , positive for  weight change.  Respiratory: Negative for cough and shortness of breath.   Cardiovascular: Negative for chest pain or palpitations.  Gastrointestinal: Negative for abdominal pain, no bowel changes.  Musculoskeletal: Negative for gait problem or joint swelling.  Skin: Negative for rash.  Neurological: Negative for dizziness or headache.  No other specific complaints in a complete review of systems (except as listed in HPI above).   Objective  Vitals:   03/06/21 0812  BP: 128/86  Pulse: 73  Resp: 16  Temp: 97.7 F (36.5 C)  SpO2: 99%  Weight: 236 lb (107 kg)  Height: $Remove'6\' 4"'tYjOTju$  (1.93 m)    Body mass index is 28.73 kg/m.  Physical Exam  Constitutional: Patient appears well-developed and well-nourished. Overweight.  No distress.  HEENT: head atraumatic, normocephalic, pupils equal and reactive to light, neck supple, throat within normal  limits Cardiovascular: Normal rate, regular rhythm and normal heart sounds.  No murmur heard. No BLE edema. Pulmonary/Chest: Effort normal and breath sounds normal. No respiratory distress. Abdominal: Soft.  There is no tenderness. Psychiatric: Cooperative  Recent Results (from the past 2160 hour(s))  Pathologist smear review     Status: None   Collection Time: 12/17/20 11:51 AM  Result Value Ref Range   Path Review Blood smear is reviewed.     Comment: Mild thrombocytopenia.  Morphology of RBCs, WBCs, and platelets within normal limits. No evidence of circulating blasts or schistocytes.  Reviewed by Elmon Kirschner, M.D. Performed at [facility], [address] [place], [place] [address]   Protein electrophoresis, serum     Status: Abnormal   Collection Time: 12/17/20 11:53 AM  Result Value Ref Range   Total Protein ELP 6.4 6.0 - 8.5 g/dL   Albumin ELP 4.1 2.9 - 4.4 g/dL   Alpha-1-Globulin 0.2 0.0 - 0.4 g/dL   Alpha-2-Globulin 0.4 0.4 - 1.0 g/dL   Beta Globulin 0.8 0.7 - 1.3 g/dL   Gamma Globulin 0.8 0.4 - 1.8 g/dL   M-Spike, % Not Observed Not Observed g/dL   SPE Interp. Comment     Comment: (NOTE) The SPE pattern appears unremarkable. Evidence of monoclonal protein is not apparent. Performed At: [facility] [place], [place] [id number] Rush Farmer MD [id number]    Comment Comment     Comment: (NOTE) Protein electrophoresis scan will follow via computer, mail, or courier delivery.    Globulin, Total 2.3 2.2 - 3.9 g/dL   A/G Ratio 1.8 (H) 0.7 - 1.7  Immature Platelet Fraction     Status: None   Collection Time: 12/17/20 11:53 AM  Result Value Ref Range   Immature Platelet Fraction 7.8 1.2 - 8.6 %    Comment: Performed at [facility], [address] [place], [place] [address]  Folate     Status: None   Collection Time: 12/17/20 11:53 AM  Result Value Ref Range   Folate 15.5 >5.9 ng/mL    Comment: Performed at  [facility], [address] [place], [place] [address]  Vitamin B12     Status: None   Collection Time: 12/17/20 11:53 AM  Result Value Ref Range   Vitamin B-12 254 180 - 914 pg/mL    Comment: (NOTE) This assay is not validated for testing neonatal or myeloproliferative syndrome specimens for Vitamin B12 levels. Performed at [facility], [place] [address], [place], [place] [address]   Flow cytometry panel-leukemia/lymphoma work-up     Status: None   Collection Time: 12/17/20 11:53 AM  Result Value Ref Range   PATH INTERP XXX-IMP Comment     Comment: No significant immunophenotypic abnormality detected   CLINICAL INFO Comment     Comment: (NOTE) A recent CBC was not available for review at the time this report was prepared.    Specimen Type Comment     Comment: Peripheral blood   ASSESSMENT OF LEUKOCYTES Comment     Comment: (NOTE) No monoclonal B cell population is detected. kappa:lambda ratio 1.8 There is no loss of, or aberrant expression of, the pan T cell antigens to suggest a neoplastic T cell process. CD4:CD8 ratio 4.0 No circulating blasts are detected. There is no immunophenotypic  evidence of abnormal myeloid maturation. Analysis of the lymphocyte population shows: B cells 10%, T cells 77%, NK cells 13%.    % Viable Cells Comment     Comment: 87%   ANALYSIS AND GATING STRATEGY Comment     Comment: 8 color analysis with CD45/SSC gating   IMMUNOPHENOTYPING STUDY Comment     Comment: (NOTE) CD2       Normal         CD3       Normal CD4       Normal         CD5       Normal CD7       Normal         CD8       Normal CD10      Normal         CD11b     Normal CD13      Normal         CD14      Normal CD16      Normal         CD19      Normal CD20      Normal         CD33      Normal CD34      Normal         CD38      Normal CD45      Normal         CD56      Normal CD57      Normal         CD117     Normal HLA-DR    Normal         KAPPA      Normal LAMBDA    Normal         CD64      Normal    PATHOLOGIST NAME Comment     Comment: Smiley Houseman, M.D.   COMMENT: Comment     Comment: (NOTE) Each antibody in this assay was utilized to assess for potential abnormalities of studied cell populations or to characterize identified abnormalities. This test was developed and its performance characteristics determined by [facility].  It has not been cleared or approved by the U.S. Food and Drug Administration. The FDA has determined that  such clearance or approval is not necessary. This test is used for clinical purposes.  It should not be regarded as investigational or for research. Performed At: [facility] [address] [place], [place] [id number] Katina Degree MDPhD [id number] Performed At: [facility] [address] [place], [place] [id number] Katina Degree MDPhD [id number]   CBC with Differential/Platelet     Status: Abnormal   Collection Time: 12/17/20 11:53 AM  Result Value Ref Range   WBC 4.7 4.0 - 10.5 K/uL   RBC 4.33 4.22 - 5.81 MIL/uL   Hemoglobin 14.2 13.0 - 17.0 g/dL   HCT 39.1 39.0 - 52.0 %   MCV 90.3 80.0 - 100.0 fL   MCH 32.8 26.0 - 34.0 pg   MCHC 36.3 (H) 30.0 - 36.0 g/dL   RDW 12.5 11.5 - 15.5 %   Platelets 112 (L) 150 - 400 K/uL   nRBC 0.0 0.0 - 0.2 %   Neutrophils Relative % 68 %   Neutro Abs 3.2 1.7 - 7.7 K/uL   Lymphocytes Relative 22 %   Lymphs Abs 1.0 0.7 - 4.0 K/uL   Monocytes Relative 9 %   Monocytes Absolute 0.4 0.1 - 1.0 K/uL   Eosinophils Relative 1 %   Eosinophils Absolute 0.1 0.0 - 0.5 K/uL   Basophils Relative 0 %   Basophils Absolute 0.0 0.0 - 0.1 K/uL   Immature Granulocytes 0 %   Abs Immature Granulocytes 0.01 0.00 - 0.07 K/uL    Comment: Performed at [facility], [address] [place], [place] [address]  Lactate dehydrogenase     Status: None   Collection Time: 12/17/20 11:53 AM  Result Value Ref Range   LDH 124 98 - 192 U/L    Comment: Performed at [facility], [address] [place], [place] [address]      PHQ2/9: Depression screen [facility] 2/9 03/06/2021 12/04/2020 09/05/2020 06/21/2020 04/03/2020  Decreased Interest 0 0 0 0 0  Down, Depressed, Hopeless 0 0 0 0 0  PHQ - 2 Score 0 0 0 0 0  Altered sleeping - - - - 0  Tired, decreased energy - - - - 0  Change in appetite - - - - 0  Feeling bad or failure about yourself  - - - - 0  Trouble concentrating - - - - 0  Moving slowly or fidgety/restless - - - - 0  Suicidal thoughts - - - - 0  PHQ-9 Score - - - - 0  Difficult doing work/chores - - - - -  Some recent data might be hidden    phq 9 is negative   Fall Risk: Fall Risk  03/06/2021 12/04/2020 09/05/2020 06/21/2020 04/03/2020  Falls in the past year? 0 0 0 0 1  Number falls in past yr: 0 0 0 0 0  Injury with Fall? 0 0 0 0 1  Comment - - - - Right knee swoll  Risk for fall due to : - - - No Fall Risks -  Follow up - - - Falls prevention discussed -     Functional Status Survey: Is the patient deaf or have difficulty hearing?: No Does the patient have difficulty seeing, even when wearing glasses/contacts?: No Does the patient have difficulty concentrating, remembering, or making decisions?: No Does the patient have difficulty walking or climbing stairs?: No Does the patient have difficulty dressing or bathing?: No Does the patient have difficulty doing errands alone such as visiting  a doctor's office or shopping?: No   Assessment & Plan  1. Thrombocytopenia ([place])  Under the care of Dr. Tasia Catchings   2. Bipolar I disorder, single manic episode, moderate ([facility])  Under the care of Dr. Tamera Punt  3. Benign essential HTN  At goal   4. Dyslipidemia   5. GERD without esophagitis   6. BPH with obstruction/lower urinary tract symptoms   7. Intellectual disability  Stable  8. Moderate protein-calorie malnutrition ([facility])  - Nutritional Supplements (ENSURE COMPLETE SHAKE) LIQD; Take 1 Bottle by mouth daily.  Dispense: 7.11 mL; Refill:  12  9. B12 deficiency  - Cyanocobalamin (VITAMIN B-12) 500 MCG SUBL; Place 1 tablet (500 mcg total) under the tongue daily after breakfast.  Dispense: 30 tablet; Refill: 12

## 2021-03-06 ENCOUNTER — Other Ambulatory Visit: Payer: Self-pay

## 2021-03-06 ENCOUNTER — Ambulatory Visit (INDEPENDENT_AMBULATORY_CARE_PROVIDER_SITE_OTHER): Payer: Medicare Other | Admitting: Family Medicine

## 2021-03-06 ENCOUNTER — Encounter: Payer: Self-pay | Admitting: Family Medicine

## 2021-03-06 VITALS — BP 128/86 | HR 73 | Temp 97.7°F | Resp 16 | Ht 76.0 in | Wt 236.0 lb

## 2021-03-06 DIAGNOSIS — D696 Thrombocytopenia, unspecified: Secondary | ICD-10-CM | POA: Diagnosis not present

## 2021-03-06 DIAGNOSIS — I10 Essential (primary) hypertension: Secondary | ICD-10-CM | POA: Diagnosis not present

## 2021-03-06 DIAGNOSIS — E538 Deficiency of other specified B group vitamins: Secondary | ICD-10-CM

## 2021-03-06 DIAGNOSIS — F3012 Manic episode without psychotic symptoms, moderate: Secondary | ICD-10-CM

## 2021-03-06 DIAGNOSIS — K219 Gastro-esophageal reflux disease without esophagitis: Secondary | ICD-10-CM | POA: Diagnosis not present

## 2021-03-06 DIAGNOSIS — F79 Unspecified intellectual disabilities: Secondary | ICD-10-CM | POA: Diagnosis not present

## 2021-03-06 DIAGNOSIS — N401 Enlarged prostate with lower urinary tract symptoms: Secondary | ICD-10-CM | POA: Diagnosis not present

## 2021-03-06 DIAGNOSIS — E785 Hyperlipidemia, unspecified: Secondary | ICD-10-CM | POA: Diagnosis not present

## 2021-03-06 DIAGNOSIS — E440 Moderate protein-calorie malnutrition: Secondary | ICD-10-CM | POA: Diagnosis not present

## 2021-03-06 DIAGNOSIS — N138 Other obstructive and reflux uropathy: Secondary | ICD-10-CM | POA: Diagnosis not present

## 2021-03-06 MED ORDER — VITAMIN B-12 500 MCG SL SUBL: 1.0000 | SUBLINGUAL_TABLET | Freq: Every day | SUBLINGUAL | 12 refills | Status: DC

## 2021-03-06 MED ORDER — MELOXICAM 15 MG PO TABS: 15.0000 mg | ORAL_TABLET | Freq: Every day | ORAL | 5 refills | Status: DC | PRN

## 2021-03-06 MED ORDER — ENSURE COMPLETE SHAKE PO LIQD: 1.0000 | Freq: Every day | ORAL | 12 refills | Status: DC

## 2021-03-06 NOTE — Addendum Note (Signed)
Addended by: Steele Sizer F on: 03/06/2021 08:40 AM   Modules accepted: Orders

## 2021-03-20 ENCOUNTER — Other Ambulatory Visit: Payer: Self-pay

## 2021-03-20 ENCOUNTER — Ambulatory Visit
Admission: EM | Admit: 2021-03-20 | Discharge: 2021-03-20 | Disposition: A | Discharge status: Home / Self Care | Payer: Medicare Other | Attending: Sports Medicine | Admitting: Sports Medicine

## 2021-03-20 DIAGNOSIS — N410 Acute prostatitis: Secondary | ICD-10-CM

## 2021-03-20 LAB — URINALYSIS, COMPLETE (UACMP) WITH MICROSCOPIC
Bilirubin Urine: NEGATIVE
Glucose, UA: NEGATIVE mg/dL
Hgb urine dipstick: NEGATIVE
Ketones, ur: NEGATIVE mg/dL
Leukocytes,Ua: NEGATIVE
Nitrite: NEGATIVE
Protein, ur: NEGATIVE mg/dL
Specific Gravity, Urine: 1.02 (ref 1.005–1.030)
pH: 7 (ref 5.0–8.0)

## 2021-03-20 MED ORDER — CIPROFLOXACIN HCL 500 MG PO TABS: 500.0000 mg | ORAL_TABLET | Freq: Two times a day (BID) | ORAL | 0 refills | Status: DC

## 2021-03-20 NOTE — ED Triage Notes (Signed)
Pt presents with caregiver and c/o increasing urinary pain for the past week. Caregiver reports pt is reluctant to eat and drink, does report some abdominal pain/nausea. Caregiver denies f/v/d or other symptoms.

## 2021-03-20 NOTE — Discharge Instructions (Addendum)
Take the Cipro twice daily for 14 days. Take this with food.  Increase oral fluid intake to increase urine production and flush urinary tract.  Return for worsening pain, blood in urine, nausea and vomiting, or fever.

## 2021-03-20 NOTE — ED Provider Notes (Addendum)
[facility]    CSN: [id number] Arrival date & time: 03/20/21  [id number]      History   Chief Complaint Chief Complaint  Patient presents with   Dysuria    HPI Wayne Jones is a 60 y.o. male.   HPI  60 year old male here for evaluation of painful urination.  Patient is here with his caregiver who reports that patient's been complaining of increasing pain with urination of the last week.  She reports that he is also reluctant to eat and drink and he has been having some suprapubic pain and nausea.  She denies fever, vomiting, or diarrhea.  She also denies any blood in the urine.  She does endorse that the urine has been dark.  Past Medical History:  Diagnosis Date   Depression    Diabetes mellitus without complication ([place])    Hyperlipidemia    Hypertension    Mental disorder     Patient Active Problem List   Diagnosis Date Noted   Osteoarthritis of knee 03/26/2020   Varicose veins, lower extremity, with inflammation, ulcerated, unspecified laterality ([place]) 05/26/2017   Bilateral lower extremity edema 04/14/2017   Thrombocytopenia ([place]) 07/20/2016   Intellectual disability 03/17/2016   Polypharmacy 01/10/2016   Benign prostatic hypertrophy without urinary obstruction 01/09/2015   Chronic kidney disease (CKD), stage I 01/09/2015   Cognitive decline 01/09/2015   History of diabetes mellitus, type II 01/09/2015   Acid reflux 01/09/2015   Hearing loss 01/09/2015   HLD (hyperlipidemia) 01/09/2015   Morbid obesity, unspecified obesity type ([place]) 01/09/2015   Obstructive sleep apnea 01/09/2015   Other specified causes of urethral stricture 01/09/2015   Hernia of anterior abdominal wall 01/09/2015   Incomplete bladder emptying 07/09/2012   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   Benign essential HTN 02/04/2007   Bipolar I disorder, single manic episode, moderate ([place]) 02/04/2007    Past Surgical History:  Procedure Laterality Date   LAPAROSCOPIC CHOLECYSTECTOMY      LAPAROSCOPY  12/03/2010   VENTRAL HERNIA REPAIR         Home Medications    Prior to Admission medications   Medication Sig Start Date End Date Taking? Authorizing Provider  ARIPiprazole (ABILIFY) 20 MG tablet Take 20 mg by mouth daily.   Yes [provider]  atorvastatin (LIPITOR) 10 MG tablet Take 1 tablet (10 mg total) by mouth daily. 07/22/19  Yes Sowles, Drue Stager, MD  carbamazepine (EQUETRO) 200 MG CP12 12 hr capsule Take 1 capsule (200 mg total) by mouth 2 (two) times daily. 01/10/16  Yes Plonk, Gwyndolyn Saxon, MD  carbamide peroxide (DEBROX) 6.5 % OTIC solution PLACE 5 DROPS INTO BOTH EARS TWO TIMES A DAY 10/13/18  Yes Sowles, Drue Stager, MD  Cholecalciferol (VITAMIN D3) 1.25 MG (50000 UT) CAPS Take 1 capsule by mouth every 14 (fourteen) days.   Yes [provider]  ciprofloxacin (CIPRO) 500 MG tablet Take 1 tablet (500 mg total) by mouth 2 (two) times daily. 03/20/21  Yes Margarette Canada, NP  clonazePAM (KLONOPIN) 1 MG tablet Take 1 mg by mouth daily.   Yes Alvy Bimler, MD  Cyanocobalamin (VITAMIN B-12) 500 MCG SUBL Place 1 tablet (500 mcg total) under the tongue daily after breakfast. 03/06/21  Yes Sowles, Drue Stager, MD  DENTA 5000 PLUS 1.1 % CREA dental cream Take by mouth. 01/27/20  Yes [provider]  Elastic Bandages & Supports (MEDICAL COMPRESSION SOCKS) MISC 2 each by Does not apply route daily. Apply in am's and remove it at bedtime 01/19/17  Yes Steele Sizer, MD  famotidine (PEPCID) 40 MG tablet TAKE ONE TABLET BY MOUTH EVERY DAY 08/27/20  Yes Sowles, Drue Stager, MD  finasteride (PROSCAR) 5 MG tablet Take 5 mg by mouth daily.   Yes [provider]  FLUoxetine (PROZAC) 20 MG capsule TAKE 1 CAPSULE BY MOUTH DAILY 06/15/20  Yes Sowles, Drue Stager, MD  gabapentin (NEURONTIN) 600 MG tablet Take 600 mg by mouth at bedtime.   Yes [provider]  ketoconazole (NIZORAL) 2 % cream Apply 1 application topically daily. Groin rash 03/05/20  Yes Sowles, Drue Stager,  MD  lisinopril (ZESTRIL) 10 MG tablet TAKE 1 TABLET BY MOUTH ONCE DAILY 05/08/20  Yes Sowles, Drue Stager, MD  loratadine (ALLERGY RELIEF) 10 MG tablet TAKE (1) TABLET BY MOUTH DAILY FOR ALLERGY. 06/15/20  Yes Sowles, Drue Stager, MD  Lubricants (K-Y JELLY) GEL USE  AS NEEDED TO BE ABLE TO MASTURBATE 01/29/21  Yes Sowles, Drue Stager, MD  meloxicam (MOBIC) 15 MG tablet Take 1 tablet (15 mg total) by mouth daily as needed for pain. Please don't give it daily 03/06/21  Yes Sowles, Drue Stager, MD  Nutritional Supplements (ENSURE COMPLETE SHAKE) LIQD Take 1 Bottle by mouth daily. 03/06/21  Yes Sowles, Drue Stager, MD  nystatin cream (MYCOSTATIN) APPLY TOPICALLY TWO TIMES A DAY 08/27/20  Yes Sowles, Drue Stager, MD  omega-3 acid ethyl esters (LOVAZA) 1 g capsule TAKE 1 CAPSULE BY MOUTH 2 TIMES DAILY 06/03/19  Yes Sowles, Drue Stager, MD  omeprazole (PRILOSEC) 20 MG capsule Take daily 07/22/19  Yes Sowles, Drue Stager, MD  Polyethyl Glyc-Propyl Glyc PF (SYSTANE ULTRA PF) 0.4-0.3 % SOLN Apply 1 each to eye daily. 04/02/18  Yes Sowles, Drue Stager, MD  promethazine (PHENERGAN) 25 MG tablet Take 1 tablet (25 mg total) by mouth every 6 (six) hours as needed for nausea or vomiting. Discontinue medication , only take zofran prn 09/05/20  Yes Sowles, Drue Stager, MD  tamsulosin (FLOMAX) 0.4 MG CAPS capsule Take 1 capsule (0.4 mg total) by mouth daily. 07/22/19  Yes Steele Sizer, MD  Vitamins A & D (VITAMIN A & D) ointment APPLY TOPICALLY AS NEEDED FOR DRY SKIN 11/09/20  Yes Sowles, Drue Stager, MD  ondansetron (ZOFRAN) 4 MG tablet TAKE 1 TABLET BY MOUTH EVERY EIGHT HOURS AS NEEDED FOR NAUSEA / VOMITING 05/14/20   Steele Sizer, MD    Family History Family History  Family history unknown: Yes    Social History Social History   Tobacco Use   Smoking status: Never   Smokeless tobacco: Never  Vaping Use   Vaping Use: Never used  Substance Use Topics   Alcohol use: No    Alcohol/week: 0.0 standard drinks   Drug use: No     Allergies   Patient has  no known allergies.   Review of Systems Review of Systems  Constitutional:  Positive for appetite change. Negative for activity change and fever.  Gastrointestinal:  Positive for abdominal pain and nausea. Negative for diarrhea and vomiting.  Genitourinary:  Positive for dysuria. Negative for frequency, hematuria and urgency.  Musculoskeletal:  Negative for back pain.  Hematological: Negative.   Psychiatric/Behavioral: Negative.      Physical Exam Triage Vital Signs ED Triage Vitals  Enc Vitals Group     BP 03/20/21 0936 139/86     Pulse Rate 03/20/21 0936 68     Resp 03/20/21 0936 18     Temp 03/20/21 0936 98 F (36.7 C)     Temp Source 03/20/21 0936 Oral     SpO2 03/20/21 0936 99 %  Weight 03/20/21 0934 224 lb (101.6 kg)     Height 03/20/21 0934 6' (1.829 m)     Head Circumference --      Peak Flow --      Pain Score --      Pain Loc --      Pain Edu? --      Excl. in [place]? --    No data found.  Updated Vital Signs BP 139/86 (BP Location: Left Arm)   Pulse 68   Temp 98 F (36.7 C) (Oral)   Resp 18   Ht 6' (1.829 m)   Wt 224 lb (101.6 kg)   SpO2 99%   BMI 30.38 kg/m   Visual Acuity Right Eye Distance:   Left Eye Distance:   Bilateral Distance:    Right Eye Near:   Left Eye Near:    Bilateral Near:     Physical Exam Vitals and nursing note reviewed.  Constitutional:      General: He is not in acute distress.    Appearance: Normal appearance. He is not ill-appearing.  HENT:     Head: Normocephalic and atraumatic.  Cardiovascular:     Rate and Rhythm: Normal rate and regular rhythm.     Pulses: Normal pulses.     Heart sounds: Normal heart sounds. No murmur heard.   No gallop.  Pulmonary:     Effort: Pulmonary effort is normal.     Breath sounds: Normal breath sounds. No wheezing, rhonchi or rales.  Abdominal:     General: Abdomen is flat.     Palpations: Abdomen is soft.     Tenderness: There is no abdominal tenderness. There is no right CVA  tenderness, left CVA tenderness, guarding or rebound.  Genitourinary:    Rectum: Normal.  Skin:    General: Skin is warm and dry.     Capillary Refill: Capillary refill takes less than 2 seconds.     Findings: No erythema or rash.  Neurological:     General: No focal deficit present.     Mental Status: He is alert and oriented to person, place, and time.  Psychiatric:        Mood and Affect: Mood normal.        Behavior: Behavior normal.        Thought Content: Thought content normal.        Judgment: Judgment normal.     UC Treatments / Results  Labs (all labs ordered are listed, but only abnormal results are displayed) Labs Reviewed  URINALYSIS, COMPLETE (UACMP) WITH MICROSCOPIC - Abnormal; Notable for the following components:      Result Value   APPearance HAZY (*)    Bacteria, UA RARE (*)    All other components within normal limits    EKG   Radiology No results found.  Procedures Procedures (including critical care time)  Medications Ordered in UC Medications - No data to display  Initial Impression / Assessment and Plan / UC Course  I have reviewed the triage vital signs and the nursing notes.  Pertinent labs & imaging results that were available during my care of the patient were reviewed by me and considered in my medical decision making (see chart for details).  Patient is a nontoxic-appearing 60 year old male here for evaluation of painful urination has been going on for the past week that is coupled with suprapubic pain, nausea, and decreased p.o. intake of fluids and solids.  Patient has had extensive urinary history to  include BPH, incomplete emptying of his bladder, chronic kidney disease, and he is also in a group home for bipolar 1 disorder, and intellectual disability.  Patient was treated for an episode of prostatitis in October 2021.Marland Kitchen  He is also complaining of some redness and soreness to his penis.  Patient physical exam reveals a benign  cardiopulmonary exam with clear lung sounds in all fields.  Patient has no CVA tenderness on exam.  Abdomen is soft, nondistended with positive bowel sounds all 4 quadrants.  Patient has very mild suprapubic tenderness.  There is a small area of erythema without excoriation on the left side of the superior aspect of the glans penis.  There are no lesions or vesicles noted.  Caregiver reports that patient frequently masturbates.  They have provided him with nystatin cream and K-Y jelly to prevent excoriation.  There is no penile discharge or rash, lesions, or chancre on the shaft of the penis.  No lesions on the perineal wall.  Patient's prostate exam reveals a normal rectum that is free of erosions, fissures, or hemorrhoids.  Patient does have a mildly distended prostate but is not complaining of any tenderness on palpation.  The prostate is smooth.  Patient has normal rectal tone.  Urinalysis was collected at triage is a hazy appearance and rare bacteria with 6-10 squamous epithelials.  The sample is negative for leukocyte esterase, nitrates, protein, or ketones.  Also no glucose.  Specific gravity is within normal at 1.020.  Will treat patient for presumptive prostatitis with Cipro twice daily x14 days.  Return precautions reviewed with caregiver and include fever, nausea or vomiting, blood in the urine, or increasing abdominal pain.   Final Clinical Impressions(s) / UC Diagnoses   Final diagnoses:  Acute prostatitis     Discharge Instructions      Take the Cipro twice daily for 14 days. Take this with food.  Increase oral fluid intake to increase urine production and flush urinary tract.  Return for worsening pain, blood in urine, nausea and vomiting, or fever.     ED Prescriptions     Medication Sig Dispense Auth. Provider   ciprofloxacin (CIPRO) 500 MG tablet Take 1 tablet (500 mg total) by mouth 2 (two) times daily. 28 tablet Margarette Canada, NP      PDMP not reviewed this  encounter.   Margarette Canada, NP 03/20/21 1028    Margarette Canada, NP 03/20/21 1029

## 2021-03-21 ENCOUNTER — Telehealth: Payer: Self-pay | Admitting: Family Medicine

## 2021-03-21 NOTE — Telephone Encounter (Signed)
Alayna with [facility] called asking for a letter of necessity for patient to be on Ensure and for his insurance to cover it  FAX to [phone]  CB#  [phone]

## 2021-03-26 ENCOUNTER — Other Ambulatory Visit: Payer: Self-pay | Admitting: Family Medicine

## 2021-03-26 ENCOUNTER — Telehealth: Payer: Self-pay | Admitting: Family Medicine

## 2021-03-26 DIAGNOSIS — E538 Deficiency of other specified B group vitamins: Secondary | ICD-10-CM

## 2021-03-26 MED ORDER — CYANOCOBALAMIN 1000 MCG/15ML PO LIQD: 5.0000 mL | Freq: Every day | ORAL | 5 refills | Status: DC

## 2021-03-26 NOTE — Telephone Encounter (Signed)
Next appt is 04/02/21

## 2021-03-26 NOTE — Telephone Encounter (Signed)
Pharmacy has reached out on behalf of Wayne [facility] home re  Cyanocobalamin (VITAMIN B-12) 500 MCG SUBL 30 tablet 12 03/06/2021    Sig - Route: Place 1 tablet (500 mcg total) under the tongue daily after breakfast. - Sublingual   Sent to pharmacy as: Cyanocobalamin (VITAMIN B-12) 500 MCG SL Tab   Staff feels that the patience that is needed for pill to dissolve is not working with his personality type. He takes other oral meds  but find  pt not wanting to let med dissolve after working with him. Request a script sent over with something he can swallow.  [name], [place] [facility] [name]  [address] [place] [address]  Phone: [phone] Fax: [phone]

## 2021-03-28 ENCOUNTER — Ambulatory Visit: Payer: Medicare Other | Admitting: Family Medicine

## 2021-03-28 NOTE — Progress Notes (Deleted)
Name: Wayne Jones   MRN: [id number]    DOB: 02-21-1961   Date:03/28/2021       Progress Note  Subjective  Chief Complaint  Groin Pain  HPI  *** Patient Active Problem List   Diagnosis Date Noted   Osteoarthritis of knee 03/26/2020   Varicose veins, lower extremity, with inflammation, ulcerated, unspecified laterality ([place]) 05/26/2017   Bilateral lower extremity edema 04/14/2017   Thrombocytopenia ([place]) 07/20/2016   Intellectual disability 03/17/2016   Polypharmacy 01/10/2016   Benign prostatic hypertrophy without urinary obstruction 01/09/2015   Chronic kidney disease (CKD), stage I 01/09/2015   Cognitive decline 01/09/2015   History of diabetes mellitus, type II 01/09/2015   Acid reflux 01/09/2015   Hearing loss 01/09/2015   HLD (hyperlipidemia) 01/09/2015   Morbid obesity, unspecified obesity type ([place]) 01/09/2015   Obstructive sleep apnea 01/09/2015   Other specified causes of urethral stricture 01/09/2015   Hernia of anterior abdominal wall 01/09/2015   Incomplete bladder emptying 07/09/2012   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   Benign essential HTN 02/04/2007   Bipolar I disorder, single manic episode, moderate ([place]) 02/04/2007    Past Surgical History:  Procedure Laterality Date   LAPAROSCOPIC CHOLECYSTECTOMY     LAPAROSCOPY  12/03/2010   VENTRAL HERNIA REPAIR      Family History  Family history unknown: Yes    Social History   Tobacco Use   Smoking status: Never   Smokeless tobacco: Never  Substance Use Topics   Alcohol use: No    Alcohol/week: 0.0 standard drinks     Current Outpatient Medications:    ARIPiprazole (ABILIFY) 20 MG tablet, Take 20 mg by mouth daily., Disp: , Rfl:    atorvastatin (LIPITOR) 10 MG tablet, Take 1 tablet (10 mg total) by mouth daily., Disp: 30 tablet, Rfl: 10   carbamazepine (EQUETRO) 200 MG CP12 12 hr capsule, Take 1 capsule (200 mg total) by mouth 2 (two) times daily., Disp: 180 each, Rfl: 3   carbamide peroxide  (DEBROX) 6.5 % OTIC solution, PLACE 5 DROPS INTO BOTH EARS TWO TIMES A DAY, Disp: 15 mL, Rfl: 0   Cholecalciferol (VITAMIN D3) 1.25 MG (50000 UT) CAPS, Take 1 capsule by mouth every 14 (fourteen) days., Disp: , Rfl:    ciprofloxacin (CIPRO) 500 MG tablet, Take 1 tablet (500 mg total) by mouth 2 (two) times daily., Disp: 28 tablet, Rfl: 0   clonazePAM (KLONOPIN) 1 MG tablet, Take 1 mg by mouth daily., Disp: , Rfl:    Cyanocobalamin 1000 MCG/15ML LIQD, Take 5 mLs (333.3333 mcg total) by mouth daily., Disp: 450 mL, Rfl: 5   DENTA 5000 PLUS 1.1 % CREA dental cream, Take by mouth., Disp: , Rfl:    Elastic Bandages & Supports (MEDICAL COMPRESSION SOCKS) MISC, 2 each by Does not apply route daily. Apply in am's and remove it at bedtime, Disp: 2 each, Rfl: 1   famotidine (PEPCID) 40 MG tablet, TAKE ONE TABLET BY MOUTH EVERY DAY, Disp: 7 tablet, Rfl: 10   finasteride (PROSCAR) 5 MG tablet, Take 5 mg by mouth daily., Disp: , Rfl:    FLUoxetine (PROZAC) 20 MG capsule, TAKE 1 CAPSULE BY MOUTH DAILY, Disp: 7 capsule, Rfl: 10   gabapentin (NEURONTIN) 600 MG tablet, Take 600 mg by mouth at bedtime., Disp: , Rfl:    ketoconazole (NIZORAL) 2 % cream, Apply 1 application topically daily. Groin rash, Disp: 60 g, Rfl: 1   lisinopril (ZESTRIL) 10 MG tablet, TAKE 1 TABLET BY MOUTH ONCE DAILY,  Disp: 30 tablet, Rfl: 5   loratadine (ALLERGY RELIEF) 10 MG tablet, TAKE (1) TABLET BY MOUTH DAILY FOR ALLERGY., Disp: 7 tablet, Rfl: 10   Lubricants (K-Y JELLY) GEL, USE  AS NEEDED TO BE ABLE TO MASTURBATE, Disp: 57 g, Rfl: 10   meloxicam (MOBIC) 15 MG tablet, Take 1 tablet (15 mg total) by mouth daily as needed for pain. Please don't give it daily, Disp: 30 tablet, Rfl: 5   Nutritional Supplements (ENSURE COMPLETE SHAKE) LIQD, Take 1 Bottle by mouth daily., Disp: 7.11 mL, Rfl: 12   nystatin cream (MYCOSTATIN), APPLY TOPICALLY TWO TIMES A DAY, Disp: 30 g, Rfl: 10   omega-3 acid ethyl esters (LOVAZA) 1 g capsule, TAKE 1 CAPSULE BY  MOUTH 2 TIMES DAILY, Disp: 62 capsule, Rfl: 10   omeprazole (PRILOSEC) 20 MG capsule, Take daily, Disp: 30 capsule, Rfl: 10   ondansetron (ZOFRAN) 4 MG tablet, TAKE 1 TABLET BY MOUTH EVERY EIGHT HOURS AS NEEDED FOR NAUSEA / VOMITING, Disp: 30 tablet, Rfl: 11   Polyethyl Glyc-Propyl Glyc PF (SYSTANE ULTRA PF) 0.4-0.3 % SOLN, Apply 1 each to eye daily., Disp: 30 each, Rfl: 11   promethazine (PHENERGAN) 25 MG tablet, Take 1 tablet (25 mg total) by mouth every 6 (six) hours as needed for nausea or vomiting. Discontinue medication , only take zofran prn, Disp: 30 tablet, Rfl: 0   tamsulosin (FLOMAX) 0.4 MG CAPS capsule, Take 1 capsule (0.4 mg total) by mouth daily., Disp: 28 capsule, Rfl: 5   Vitamins A & D (VITAMIN A & D) ointment, APPLY TOPICALLY AS NEEDED FOR DRY SKIN, Disp: 113 g, Rfl: 10  No Known Allergies  I personally reviewed {Reviewed:[id number]} with the patient/caregiver today.   ROS  ***  Objective  There were no vitals filed for this visit.  There is no height or weight on file to calculate BMI.  Physical Exam ***  Recent Results (from the past 2160 hour(s))  Urinalysis, Complete w Microscopic Urine, Clean Catch     Status: Abnormal   Collection Time: 03/20/21  9:38 AM  Result Value Ref Range   Color, Urine YELLOW YELLOW   APPearance HAZY (A) CLEAR   Specific Gravity, Urine 1.020 1.005 - 1.030   pH 7.0 5.0 - 8.0   Glucose, UA NEGATIVE NEGATIVE mg/dL   Hgb urine dipstick NEGATIVE NEGATIVE   Bilirubin Urine NEGATIVE NEGATIVE   Ketones, ur NEGATIVE NEGATIVE mg/dL   Protein, ur NEGATIVE NEGATIVE mg/dL   Nitrite NEGATIVE NEGATIVE   Leukocytes,Ua NEGATIVE NEGATIVE   Squamous Epithelial / LPF 6-10 0 - 5   WBC, UA 0-5 0 - 5 WBC/hpf   RBC / HPF 0-5 0 - 5 RBC/hpf   Bacteria, UA RARE (A) NONE SEEN    Comment: Performed at [facility], [address] [place], [place] [address]    Diabetic Foot Exam: Diabetic Foot Exam - Simple   No data filed     ***  PHQ2/9: Depression screen [facility] 2/9 03/06/2021 12/04/2020 09/05/2020 06/21/2020 04/03/2020  Decreased Interest 0 0 0 0 0  Down, Depressed, Hopeless 0 0 0 0 0  PHQ - 2 Score 0 0 0 0 0  Altered sleeping - - - - 0  Tired, decreased energy - - - - 0  Change in appetite - - - - 0  Feeling bad or failure about yourself  - - - - 0  Trouble concentrating - - - - 0  Moving slowly or fidgety/restless - - - -  0  Suicidal thoughts - - - - 0  PHQ-9 Score - - - - 0  Difficult doing work/chores - - - - -  Some recent data might be hidden    phq 9 is {gen pos [id number] ***  Fall Risk: Fall Risk  03/06/2021 12/04/2020 09/05/2020 06/21/2020 04/03/2020  Falls in the past year? 0 0 0 0 1  Number falls in past yr: 0 0 0 0 0  Injury with Fall? 0 0 0 0 1  Comment - - - - Right knee swoll  Risk for fall due to : - - - No Fall Risks -  Follow up - - - Falls prevention discussed -   ***   Functional Status Survey:   ***   Assessment & Plan  *** There are no diagnoses linked to this encounter.

## 2021-03-29 ENCOUNTER — Telehealth: Payer: Self-pay | Admitting: Family Medicine

## 2021-03-29 NOTE — Telephone Encounter (Signed)
Alayna with [facility] called saying they need some specific info for the Ensure that was ordered  rx.  RX should say the number of calories per bottle.  CB#  [phone] FAX [phone]

## 2021-04-02 ENCOUNTER — Ambulatory Visit: Payer: Medicare Other | Admitting: Family Medicine

## 2021-04-02 ENCOUNTER — Other Ambulatory Visit: Payer: Self-pay

## 2021-04-02 MED ORDER — ENSURE COMPLETE PO LIQD: 237.0000 mL | Freq: Two times a day (BID) | ORAL | 3 refills | Status: AC

## 2021-04-03 ENCOUNTER — Telehealth: Payer: Self-pay

## 2021-04-03 NOTE — Telephone Encounter (Signed)
Copied from [place] [id number]. Topic: General - Other >> Apr 03, 2021  4:28 PM Pawlus, Brayton Layman A wrote: Reason for CRM: The pts care coordinator needed an update on feeding supplement, ENSURE COMPLETE, (ENSURE COMPLETE) LIQD, caller stated the calories need to be listed as well, please advise.

## 2021-04-03 NOTE — Progress Notes (Signed)
Name: Wayne Jones   MRN: [id number]    DOB: 1960/08/23   Date:04/04/2021       Progress Note  Subjective  Chief Complaint  FL2, lives at [facility], came in with [facility] today   HPI  History of DM: his A1C has been normal for over one year without medication.   He is on statin therapy No symptoms of hypoglycemia. He is losing weight, we will recheck A1C today   HTN: he is off beta blocker, Ace, bp today is at goal, no chest pain and no recent episodes of palpitation, but patient is not a big complainer    BPH: he used to see  Dr. Jacqlyn Larsen, he still has nocturia, he still masturbates often and causes penile irritation, but not problems at this time since he is not longer staying long hours at group home, he is back at the day programs and no complaints of balanitis . Now under the care of Dr. Shirlean Kelly  He is on Proscar and Flomax, last PSA was at goal and we will recheck it today    Dyslipidemia: taking statin and Lovaza. We will recheck lipid panel today    Malnutrition: normal appetite, he continues to lose weight, down another 3 lbs in the past three weeks. Caregiver states he seems to have an normal appetite. His wait was in the 260 lbs back in 2019, 250 lbs in 2020's and is losing more. Reviewed labs we did last visit, he is seeing Dr. Tasia Catchings for thrombocytopenia but not for the weight loss, I will send her a message to ask for her assistance. Continue protein supplementation and high calorie diet     Patient Active Problem List   Diagnosis Date Noted   Osteoarthritis of knee 03/26/2020   Varicose veins, lower extremity, with inflammation, ulcerated, unspecified laterality ([place]) 05/26/2017   Bilateral lower extremity edema 04/14/2017   Thrombocytopenia ([place]) 07/20/2016   Intellectual disability 03/17/2016   Polypharmacy 01/10/2016   Benign prostatic hypertrophy without urinary obstruction 01/09/2015   Chronic kidney disease (CKD), stage I 01/09/2015   Cognitive decline 01/09/2015    History of diabetes mellitus, type II 01/09/2015   Acid reflux 01/09/2015   Hearing loss 01/09/2015   HLD (hyperlipidemia) 01/09/2015   Morbid obesity, unspecified obesity type ([place]) 01/09/2015   Obstructive sleep apnea 01/09/2015   Other specified causes of urethral stricture 01/09/2015   Hernia of anterior abdominal wall 01/09/2015   Incomplete bladder emptying 07/09/2012   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   Benign essential HTN 02/04/2007   Bipolar I disorder, single manic episode, moderate ([place]) 02/04/2007    Past Surgical History:  Procedure Laterality Date   LAPAROSCOPIC CHOLECYSTECTOMY     LAPAROSCOPY  12/03/2010   VENTRAL HERNIA REPAIR      Family History  Family history unknown: Yes    Social History   Tobacco Use   Smoking status: Never   Smokeless tobacco: Never  Substance Use Topics   Alcohol use: No    Alcohol/week: 0.0 standard drinks     Current Outpatient Medications:    ARIPiprazole (ABILIFY) 20 MG tablet, Take 20 mg by mouth daily., Disp: , Rfl:    atorvastatin (LIPITOR) 10 MG tablet, Take 1 tablet (10 mg total) by mouth daily., Disp: 30 tablet, Rfl: 10   carbamazepine (EQUETRO) 200 MG CP12 12 hr capsule, Take 1 capsule (200 mg total) by mouth 2 (two) times daily., Disp: 180 each, Rfl: 3   carbamide peroxide (DEBROX) 6.5 % OTIC  solution, PLACE 5 DROPS INTO BOTH EARS TWO TIMES A DAY, Disp: 15 mL, Rfl: 0   Cholecalciferol (VITAMIN D3) 1.25 MG (50000 UT) CAPS, Take 1 capsule by mouth every 14 (fourteen) days., Disp: , Rfl:    ciprofloxacin (CIPRO) 500 MG tablet, Take 1 tablet (500 mg total) by mouth 2 (two) times daily., Disp: 28 tablet, Rfl: 0   clonazePAM (KLONOPIN) 1 MG tablet, Take 1 mg by mouth daily., Disp: , Rfl:    Cyanocobalamin 1000 MCG/15ML LIQD, Take 5 mLs (333.3333 mcg total) by mouth daily., Disp: 450 mL, Rfl: 5   DENTA 5000 PLUS 1.1 % CREA dental cream, Take by mouth., Disp: , Rfl:    Elastic Bandages & Supports (MEDICAL COMPRESSION  SOCKS) MISC, 2 each by Does not apply route daily. Apply in am's and remove it at bedtime, Disp: 2 each, Rfl: 1   famotidine (PEPCID) 40 MG tablet, TAKE ONE TABLET BY MOUTH EVERY DAY, Disp: 7 tablet, Rfl: 10   feeding supplement, ENSURE COMPLETE, (ENSURE COMPLETE) LIQD, Take 237 mLs by mouth 2 (two) times daily between meals., Disp: 237 mL, Rfl: 3   finasteride (PROSCAR) 5 MG tablet, Take 5 mg by mouth daily., Disp: , Rfl:    FLUoxetine (PROZAC) 20 MG capsule, TAKE 1 CAPSULE BY MOUTH DAILY, Disp: 7 capsule, Rfl: 10   gabapentin (NEURONTIN) 600 MG tablet, Take 600 mg by mouth at bedtime., Disp: , Rfl:    ketoconazole (NIZORAL) 2 % cream, Apply 1 application topically daily. Groin rash, Disp: 60 g, Rfl: 1   lisinopril (ZESTRIL) 10 MG tablet, TAKE 1 TABLET BY MOUTH ONCE DAILY, Disp: 30 tablet, Rfl: 5   loratadine (ALLERGY RELIEF) 10 MG tablet, TAKE (1) TABLET BY MOUTH DAILY FOR ALLERGY., Disp: 7 tablet, Rfl: 10   Lubricants (K-Y JELLY) GEL, USE  AS NEEDED TO BE ABLE TO MASTURBATE, Disp: 57 g, Rfl: 10   meloxicam (MOBIC) 15 MG tablet, Take 1 tablet (15 mg total) by mouth daily as needed for pain. Please don't give it daily, Disp: 30 tablet, Rfl: 5   nystatin cream (MYCOSTATIN), APPLY TOPICALLY TWO TIMES A DAY, Disp: 30 g, Rfl: 10   omega-3 acid ethyl esters (LOVAZA) 1 g capsule, TAKE 1 CAPSULE BY MOUTH 2 TIMES DAILY, Disp: 62 capsule, Rfl: 10   omeprazole (PRILOSEC) 20 MG capsule, Take daily, Disp: 30 capsule, Rfl: 10   ondansetron (ZOFRAN) 4 MG tablet, TAKE 1 TABLET BY MOUTH EVERY EIGHT HOURS AS NEEDED FOR NAUSEA / VOMITING, Disp: 30 tablet, Rfl: 11   Polyethyl Glyc-Propyl Glyc PF (SYSTANE ULTRA PF) 0.4-0.3 % SOLN, Apply 1 each to eye daily., Disp: 30 each, Rfl: 11   promethazine (PHENERGAN) 25 MG tablet, Take 1 tablet (25 mg total) by mouth every 6 (six) hours as needed for nausea or vomiting. Discontinue medication , only take zofran prn, Disp: 30 tablet, Rfl: 0   tamsulosin (FLOMAX) 0.4 MG CAPS  capsule, Take 1 capsule (0.4 mg total) by mouth daily., Disp: 28 capsule, Rfl: 5   Vitamins A & D (VITAMIN A & D) ointment, APPLY TOPICALLY AS NEEDED FOR DRY SKIN, Disp: 113 g, Rfl: 10  No Known Allergies  I personally reviewed active problem list, medication list, allergies, family history, social history, health maintenance with the patient/caregiver today.   ROS  Constitutional: Negative for fever, he lost 3 more pounds in the past few weeks  Respiratory: Negative for cough and shortness of breath.   Cardiovascular: Negative for chest pain or palpitations.  Gastrointestinal: Negative for abdominal pain, no bowel changes.  Musculoskeletal: Negative for gait problem or joint swelling.  Skin: Negative for rash.  Neurological: Negative for dizziness or headache.  No other specific complaints in a complete review of systems (except as listed in HPI above).   Objective  Vitals:   04/04/21 0953  BP: 130/84  Pulse: 74  Resp: 16  Temp: 97.7 F (36.5 C)  SpO2: 99%  Weight: 233 lb (105.7 kg)  Height: '6\' 4"'$  (1.93 m)    Body mass index is 28.36 kg/m.  Physical Exam  Constitutional: Patient appears well-developed and malnourished   No distress.  HEENT: head atraumatic, normocephalic, pupils equal and reactive to light Cardiovascular: Normal rate, regular rhythm and normal heart sounds.  No murmur heard. No BLE edema. Pulmonary/Chest: Effort normal and breath sounds normal. No respiratory distress. Abdominal: Soft.  There is no tenderness. Psychiatric: Cooperative, moving back and forth on the table, very few words when prompted.   Recent Results (from the past 2160 hour(s))  Urinalysis, Complete w Microscopic Urine, Clean Catch     Status: Abnormal   Collection Time: 03/20/21  9:38 AM  Result Value Ref Range   Color, Urine YELLOW YELLOW   APPearance HAZY (A) CLEAR   Specific Gravity, Urine 1.020 1.005 - 1.030   pH 7.0 5.0 - 8.0   Glucose, UA NEGATIVE NEGATIVE mg/dL   Hgb  urine dipstick NEGATIVE NEGATIVE   Bilirubin Urine NEGATIVE NEGATIVE   Ketones, ur NEGATIVE NEGATIVE mg/dL   Protein, ur NEGATIVE NEGATIVE mg/dL   Nitrite NEGATIVE NEGATIVE   Leukocytes,Ua NEGATIVE NEGATIVE   Squamous Epithelial / LPF 6-10 0 - 5   WBC, UA 0-5 0 - 5 WBC/hpf   RBC / HPF 0-5 0 - 5 RBC/hpf   Bacteria, UA RARE (A) NONE SEEN    Comment: Performed at [facility], [address], [place], [place] [address]     PHQ2/9: Depression screen [facility] 2/9 04/04/2021 03/06/2021 12/04/2020 09/05/2020 06/21/2020  Decreased Interest 0 0 0 0 0  Down, Depressed, Hopeless 0 0 0 0 0  PHQ - 2 Score 0 0 0 0 0  Altered sleeping - - - - -  Tired, decreased energy - - - - -  Change in appetite - - - - -  Feeling bad or failure about yourself  - - - - -  Trouble concentrating - - - - -  Moving slowly or fidgety/restless - - - - -  Suicidal thoughts - - - - -  PHQ-9 Score - - - - -  Difficult doing work/chores - - - - -  Some recent data might be hidden    phq 9 is negative   Fall Risk: Fall Risk  04/04/2021 03/06/2021 12/04/2020 09/05/2020 06/21/2020  Falls in the past year? 0 0 0 0 0  Number falls in past yr: 0 0 0 0 0  Injury with Fall? 0 0 0 0 0  Comment - - - - -  Risk for fall due to : No Fall Risks - - - No Fall Risks  Follow up Falls prevention discussed - - - Falls prevention discussed      Functional Status Survey: Is the patient deaf or have difficulty hearing?: No Does the patient have difficulty seeing, even when wearing glasses/contacts?: No Does the patient have difficulty concentrating, remembering, or making decisions?: No Does the patient have difficulty walking or climbing stairs?: Yes Does the patient have difficulty dressing or  bathing?: No Does the patient have difficulty doing errands alone such as visiting a doctor's office or shopping?: Yes    Assessment & Plan  1. Moderate protein-calorie malnutrition ([place])  Reviewed labs, continue protein shakes, try  high protein diet Asked caregiver : Marcell Barlow today about his power or attorney of health care. Needs to find out how much to do to investigate his weight loss   2. Thrombocytopenia ([place])   3. Benign essential HTN  - COMPLETE METABOLIC PANEL WITH GFR  4. Dyslipidemia  - Lipid panel  5. Intellectual disability   6. Need for immunization against influenza  - Flu Vaccine QUAD 65moIM (Fluarix, Fluzone & Alfiuria Quad PF)  7. BPH with obstruction/lower urinary tract symptoms  - PSA  8. History of diabetes mellitus  - Hemoglobin A1c

## 2021-04-03 NOTE — Telephone Encounter (Signed)
Completed and faxed.

## 2021-04-04 ENCOUNTER — Ambulatory Visit (INDEPENDENT_AMBULATORY_CARE_PROVIDER_SITE_OTHER): Payer: Medicare Other | Admitting: Family Medicine

## 2021-04-04 ENCOUNTER — Other Ambulatory Visit: Payer: Self-pay

## 2021-04-04 ENCOUNTER — Encounter: Payer: Self-pay | Admitting: Family Medicine

## 2021-04-04 VITALS — BP 130/84 | HR 74 | Temp 97.7°F | Resp 16 | Ht 76.0 in | Wt 233.0 lb

## 2021-04-04 DIAGNOSIS — E440 Moderate protein-calorie malnutrition: Secondary | ICD-10-CM

## 2021-04-04 DIAGNOSIS — D696 Thrombocytopenia, unspecified: Secondary | ICD-10-CM | POA: Diagnosis not present

## 2021-04-04 DIAGNOSIS — Z8639 Personal history of other endocrine, nutritional and metabolic disease: Secondary | ICD-10-CM

## 2021-04-04 DIAGNOSIS — N138 Other obstructive and reflux uropathy: Secondary | ICD-10-CM

## 2021-04-04 DIAGNOSIS — Z23 Encounter for immunization: Secondary | ICD-10-CM | POA: Diagnosis not present

## 2021-04-04 DIAGNOSIS — E785 Hyperlipidemia, unspecified: Secondary | ICD-10-CM | POA: Diagnosis not present

## 2021-04-04 DIAGNOSIS — F79 Unspecified intellectual disabilities: Secondary | ICD-10-CM

## 2021-04-04 DIAGNOSIS — N401 Enlarged prostate with lower urinary tract symptoms: Secondary | ICD-10-CM

## 2021-04-04 DIAGNOSIS — I10 Essential (primary) hypertension: Secondary | ICD-10-CM | POA: Diagnosis not present

## 2021-04-04 NOTE — Patient Instructions (Signed)
High-Protein and High-Calorie Diet Eating high-protein and high-calorie foods can help you to gain weight, heal after an injury, and recover after an illness or surgery. The specific amount of daily protein and calories you need depends on: Your body weight. The reason this diet is recommended for you. Generally, a high-protein, high-calorie diet involves: Eating 250-500 extra calories each day. Making sure that you get enough of your daily calories from protein. Ask your health care provider how many of your calories should come from protein. Talk with a health care provider or a dietitian about how much protein and how many calories you need each day. Follow the diet as directed by your health care provider. What are tips for following this plan? Reading food labels Check the nutrition facts label for calories, grams of fat and protein. Items with more than 4 grams of protein are high-protein foods. Preparing meals Add whole milk, half-and-half, or heavy cream to cereal, pudding, soup, or hot cocoa. Add whole milk to instant breakfast drinks. Add peanut butter to oatmeal or smoothies. Add powdered milk to baked goods, smoothies, or milkshakes. Add powdered milk, cream, or butter to mashed potatoes. Add cheese to cooked vegetables. Make whole-milk yogurt parfaits. Top them with granola, fruit, or nuts. Add cottage cheese to fruit. Add avocado, cheese, or both to sandwiches or salads. Add avocado to smoothies. Add meat, poultry, or seafood to rice, pasta, casseroles, salads, and soups. Use mayonnaise when making egg salad, chicken salad, or tuna salad. Use peanut butter as a dip for fruits and vegetables or as a topping for pretzels, celery, or crackers. Add beans to casseroles, dips, and spreads. Add pureed beans to sauces and soups. Replace calorie-free drinks with calorie-containing drinks, such as milk and fruit juice. Replace water with milk or heavy cream when making foods such as  oatmeal, pudding, or cocoa. Add oil or butter to cooked vegetables and grains. Add cream cheese to sandwiches or as a topping on crackers and bread. Make cream-based pastas and soups. General information Ask your health care provider if you should take a nutritional supplement. Try to eat six small meals each day instead of three large meals. A general goal is to eat every 2 to 3 hours. Eat a balanced diet. In each meal, include one food that is high in protein and one food with fat in it. Keep nutritious snacks available, such as nuts, trail mixes, dried fruit, and yogurt. If you have kidney disease or diabetes, talk with your health care provider about how much protein is safe for you. Too much protein may put extra stress on your kidneys. Drink your calories. Choose high-calorie drinks and have them after your meals. Consider setting a timer to remind you to eat. You will want to eat even if you do not feel very hungry. What high-protein foods should I eat? Vegetables Soybeans. Peas. Grains Quinoa. Bulgur wheat. Buckwheat. Meats and other proteins Beef, pork, and poultry. Fish and seafood. Eggs. Tofu. Textured vegetable protein (TVP). Peanut butter. Nuts and seeds. Dried beans. Protein powders. Hummus. Dairy Whole milk. Whole-milk yogurt. Powdered milk. Cheese. Cottage Cheese. Eggnog. Beverages High-protein supplement drinks. Soy milk. Other foods Protein bars. The items listed above may not be a complete list of foods and beverages you can eat and drink. Contact a dietitian for more information. What high-calorie foods should I eat? Fruits Dried fruit. Fruit leather. Canned fruit in syrup. Fruit juice. Avocado. Vegetables Vegetables cooked in oil or butter. Fried potatoes. Grains Pasta. Quick   breads. Muffins. Pancakes. Ready-to-eat cereal. Meats and other proteins Peanut butter. Nuts and seeds. Dairy Heavy cream. Whipped cream. Cream cheese. Sour cream. Ice cream. Custard.  Pudding. Whole milk dairy products. Beverages Meal-replacement beverages. Nutrition shakes. Fruit juice. Seasonings and condiments Salad dressing. Mayonnaise. Alfredo sauce. Fruit preserves or jelly. Honey. Syrup. Sweets and desserts Cake. Cookies. Pie. Pastries. Candy bars. Chocolate. Fats and oils Butter or margarine. Oil. Gravy. Other foods Meal-replacement bars. The items listed above may not be a complete list of foods and beverages you can eat and drink. Contact a dietitian for more information. Summary A high-protein, high-calorie diet can help you gain weight or heal faster after an injury, illness, or surgery. To increase your protein and calories, add ingredients such as whole milk, peanut butter, cheese, beans, meat, or seafood to meal items. To get enough extra calories each day, include high-calorie foods and beverages at each meal. Adding a high-calorie drink or shake can be an easy way to help you get enough calories each day. Talk with your healthcare provider or dietitian about the best options for you. This information is not intended to replace advice given to you by your health care provider. Make sure you discuss any questions you have with your health care provider. Document Revised: 06/24/2020 Document Reviewed: 06/24/2020 Elsevier Patient Education  2022 [employer]  

## 2021-04-05 LAB — COMPLETE METABOLIC PANEL WITH GFR
AG Ratio: 2.3 (calc) (ref 1.0–2.5)
ALT: 17 U/L (ref 9–46)
AST: 16 U/L (ref 10–35)
Albumin: 4.3 g/dL (ref 3.6–5.1)
Alkaline phosphatase (APISO): 74 U/L (ref 35–144)
BUN/Creatinine Ratio: 19 (calc) (ref 6–22)
BUN: 13 mg/dL (ref 7–25)
CO2: 30 mmol/L (ref 20–32)
Calcium: 9 mg/dL (ref 8.6–10.3)
Chloride: 100 mmol/L (ref 98–110)
Creat: 0.67 mg/dL — ABNORMAL LOW (ref 0.70–1.35)
Globulin: 1.9 g/dL (calc) (ref 1.9–3.7)
Glucose, Bld: 99 mg/dL (ref 65–99)
Potassium: 4.7 mmol/L (ref 3.5–5.3)
Sodium: 136 mmol/L (ref 135–146)
Total Bilirubin: 0.5 mg/dL (ref 0.2–1.2)
Total Protein: 6.2 g/dL (ref 6.1–8.1)
eGFR: 107 mL/min/{1.73_m2} (ref >=60)

## 2021-04-05 LAB — LIPID PANEL
Cholesterol: 126 mg/dL (ref <200)
HDL: 63 mg/dL (ref >=40)
LDL Cholesterol (Calc): 48 mg/dL (calc)
Non-HDL Cholesterol (Calc): 63 mg/dL (calc) (ref <130)
Total CHOL/HDL Ratio: 2 (calc) (ref <5.0)
Triglycerides: 73 mg/dL (ref <150)

## 2021-04-05 LAB — HEMOGLOBIN A1C
Hgb A1c MFr Bld: 4.7 % of total Hgb (ref <5.7)
Mean Plasma Glucose: 88 mg/dL
eAG (mmol/L): 4.9 mmol/L

## 2021-04-05 LAB — PSA: PSA: 0.13 ng/mL (ref <=4.00)

## 2021-04-15 ENCOUNTER — Inpatient Hospital Stay: Payer: Medicare Other | Attending: Oncology | Admitting: Oncology

## 2021-04-15 ENCOUNTER — Encounter: Payer: Self-pay | Admitting: Oncology

## 2021-04-15 VITALS — BP 134/69 | HR 63 | Temp 97.8°F | Resp 20 | Wt 236.1 lb

## 2021-04-15 DIAGNOSIS — D696 Thrombocytopenia, unspecified: Secondary | ICD-10-CM | POA: Insufficient documentation

## 2021-04-15 DIAGNOSIS — R634 Abnormal weight loss: Secondary | ICD-10-CM

## 2021-04-15 DIAGNOSIS — I10 Essential (primary) hypertension: Secondary | ICD-10-CM | POA: Insufficient documentation

## 2021-04-15 DIAGNOSIS — E119 Type 2 diabetes mellitus without complications: Secondary | ICD-10-CM | POA: Insufficient documentation

## 2021-04-15 DIAGNOSIS — E785 Hyperlipidemia, unspecified: Secondary | ICD-10-CM | POA: Insufficient documentation

## 2021-04-15 DIAGNOSIS — Z79899 Other long term (current) drug therapy: Secondary | ICD-10-CM | POA: Diagnosis not present

## 2021-04-15 DIAGNOSIS — F32A Depression, unspecified: Secondary | ICD-10-CM | POA: Diagnosis not present

## 2021-04-15 NOTE — Progress Notes (Signed)
PCP is concerned about his weight loss. PCP put him on Ensure drinks.

## 2021-04-15 NOTE — Progress Notes (Signed)
Hematology/Oncology progress note [facility] Telephone:([phone] [phone]   Patient Care Team: Steele Sizer, MD as PCP - General (Family Medicine) Alvy Bimler, MD as Referring Physician (Psychiatry) Gardiner Barefoot, DPM as Consulting Physician (Podiatry) Delfin Edis, MD as Referring Physician (Urology)  REFERRING PROVIDER: Steele Sizer, MD  CHIEF COMPLAINTS/REASON FOR VISIT:  thrombocytopenia  HISTORY OF PRESENTING ILLNESS:  Wayne Jones is a 60 y.o. male who was seen in consultation at the request of Steele Sizer, MD for evaluation of thrombocytopenia   Reviewed patient's labs done previously.  12/04/2020 labs showed decreased platelet counts at 99,000.  Normal wbc  hemoglobin  Reviewed patient's previous labs. Thrombocytopenia is chronic chronic onset , since at least 2014 with baseline around 100,000. Patient has a history of mental disorders and lives in a group home.  He is a poor historian.  Denies any new complaints.  Denies any easy bleeding or bruising.  He was accompanied by staff member from the group home.  Staff denies any recent change of Abilify and carbamazepine.  INTERVAL HISTORY [name] is a 60 y.o. male who has above history reviewed by me today presents for follow up visit for management of thrombocytopenia Patient was accompanied by group home staff.there is report of weight loss. Patient has been started on nutritional supplementation.  He was reported eating normal portion of meals.  No report of fever, night sweats.   Review of Systems  Unable to perform ROS: Other (Mental disorder)  Constitutional:  Negative for appetite change, fatigue and unexpected weight change.  Respiratory:  Negative for cough and shortness of breath.   Cardiovascular:  Negative for chest pain.  Genitourinary:  Negative for dysuria.   Hematological:  Negative for adenopathy. Does not bruise/bleed easily.    MEDICAL HISTORY:  Past Medical History:  Diagnosis Date   Depression    Diabetes mellitus without complication ([place])    Hyperlipidemia    Hypertension    Mental disorder     SURGICAL HISTORY: Past Surgical History:  Procedure Laterality Date   LAPAROSCOPIC CHOLECYSTECTOMY     LAPAROSCOPY  12/03/2010   VENTRAL HERNIA REPAIR      SOCIAL HISTORY: Social History   Socioeconomic History   Marital status: Single    Spouse name: Not on file   Number of children: Not on file   Years of education: Not on file   Highest education level: Not on file  Occupational History   Occupation: disabled    Comment: Patient in care home  Tobacco Use   Smoking status: Never   Smokeless tobacco: Never  Vaping Use   Vaping Use: Never used  Substance and Sexual Activity   Alcohol use: No    Alcohol/week: 0.0 standard drinks   Drug use: No   Sexual activity: Never  Other Topics Concern   Not on file  Social History Narrative   [name] calls him twice weekly. Pt resides at [facility] group home at [facility] [employer]    Social Determinants of Health   Financial Resource Strain: Low Risk    Difficulty of Paying Living Expenses: Not hard at all  Food Insecurity: No Food Insecurity   Worried About Charity fundraiser in the Last Year: Never true   Arboriculturist in the Last Year: Never true  Transportation Needs: No Transportation Needs   Lack of Transportation (Medical): No   Lack of Transportation (Non-Medical): No  Physical Activity: Insufficiently Active  Days of Exercise per Week: 7 days   Minutes of Exercise per Session: 10 min  Stress: No Stress Concern Present   Feeling of Stress : Not at all  Social Connections: Unknown   Frequency of Communication with Friends and Family: Patient refused   Frequency of Social Gatherings with Friends and Family: Patient refused   Attends Religious Services: Patient refused   Marine scientist or Organizations: Patient refused    Attends Music therapist: Patient refused   Marital Status: Never married  Human resources officer Violence: Not At Risk   Fear of Current or Ex-Partner: No   Emotionally Abused: No   Physically Abused: No   Sexually Abused: No    FAMILY HISTORY: Family History  Family history unknown: Yes    ALLERGIES:  has No Known Allergies.  MEDICATIONS:  Current Outpatient Medications  Medication Sig Dispense Refill   ARIPiprazole (ABILIFY) 20 MG tablet Take 20 mg by mouth daily.     atorvastatin (LIPITOR) 10 MG tablet Take 1 tablet (10 mg total) by mouth daily. 30 tablet 10   carbamazepine (EQUETRO) 200 MG CP12 12 hr capsule Take 1 capsule (200 mg total) by mouth 2 (two) times daily. 180 each 3   carbamide peroxide (DEBROX) 6.5 % OTIC solution PLACE 5 DROPS INTO BOTH EARS TWO TIMES A DAY 15 mL 0   Cholecalciferol (VITAMIN D3) 1.25 MG (50000 UT) CAPS Take 1 capsule by mouth every 14 (fourteen) days.     ciprofloxacin (CIPRO) 500 MG tablet Take 1 tablet (500 mg total) by mouth 2 (two) times daily. 28 tablet 0   clonazePAM (KLONOPIN) 1 MG tablet Take 1 mg by mouth daily.     Cyanocobalamin 1000 MCG/15ML LIQD Take 5 mLs (333.3333 mcg total) by mouth daily. 450 mL 5   DENTA 5000 PLUS 1.1 % CREA dental cream Take by mouth.     Elastic Bandages & Supports (MEDICAL COMPRESSION SOCKS) MISC 2 each by Does not apply route daily. Apply in am's and remove it at bedtime 2 each 1   famotidine (PEPCID) 40 MG tablet TAKE ONE TABLET BY MOUTH EVERY DAY 7 tablet 10   feeding supplement, ENSURE COMPLETE, (ENSURE COMPLETE) LIQD Take 237 mLs by mouth 2 (two) times daily between meals. 237 mL 3   finasteride (PROSCAR) 5 MG tablet Take 5 mg by mouth daily.     FLUoxetine (PROZAC) 20 MG capsule TAKE 1 CAPSULE BY MOUTH DAILY 7 capsule 10   gabapentin (NEURONTIN) 600 MG tablet Take 600 mg by mouth at bedtime.     ketoconazole (NIZORAL) 2 % cream Apply 1 application topically daily. Groin rash 60 g 1   lisinopril  (ZESTRIL) 10 MG tablet TAKE 1 TABLET BY MOUTH ONCE DAILY 30 tablet 5   loratadine (ALLERGY RELIEF) 10 MG tablet TAKE (1) TABLET BY MOUTH DAILY FOR ALLERGY. 7 tablet 10   Lubricants (K-Y JELLY) GEL USE  AS NEEDED TO BE ABLE TO MASTURBATE 57 g 10   meloxicam (MOBIC) 15 MG tablet Take 1 tablet (15 mg total) by mouth daily as needed for pain. Please don't give it daily 30 tablet 5   nystatin cream (MYCOSTATIN) APPLY TOPICALLY TWO TIMES A DAY 30 g 10   omega-3 acid ethyl esters (LOVAZA) 1 g capsule TAKE 1 CAPSULE BY MOUTH 2 TIMES DAILY 62 capsule 10   omeprazole (PRILOSEC) 20 MG capsule Take daily 30 capsule 10   ondansetron (ZOFRAN) 4 MG tablet TAKE 1 TABLET BY MOUTH  EVERY EIGHT HOURS AS NEEDED FOR NAUSEA / VOMITING 30 tablet 11   Polyethyl Glyc-Propyl Glyc PF (SYSTANE ULTRA PF) 0.4-0.3 % SOLN Apply 1 each to eye daily. 30 each 11   promethazine (PHENERGAN) 25 MG tablet Take 1 tablet (25 mg total) by mouth every 6 (six) hours as needed for nausea or vomiting. Discontinue medication , only take zofran prn 30 tablet 0   tamsulosin (FLOMAX) 0.4 MG CAPS capsule Take 1 capsule (0.4 mg total) by mouth daily. 28 capsule 5   Vitamins A & D (VITAMIN A & D) ointment APPLY TOPICALLY AS NEEDED FOR DRY SKIN 113 g 10   No current facility-administered medications for this visit.     PHYSICAL EXAMINATION: ECOG PERFORMANCE STATUS: 1 - Symptomatic but completely ambulatory Vitals:   04/15/21 1449  BP: 134/69  Pulse: 63  Resp: 20  Temp: 97.8 F (36.6 C)  SpO2: 100%   Filed Weights   04/15/21 1449  Weight: 236 lb 1.6 oz (107.1 kg)    Physical Exam Constitutional:      General: He is not in acute distress. HENT:     Head: Normocephalic and atraumatic.  Eyes:     General: No scleral icterus. Cardiovascular:     Rate and Rhythm: Normal rate and regular rhythm.     Heart sounds: Normal heart sounds.  Pulmonary:     Effort: Pulmonary effort is normal. No respiratory distress.     Breath sounds: No  wheezing.  Abdominal:     General: Bowel sounds are normal. There is no distension.     Palpations: Abdomen is soft.  Musculoskeletal:        General: No deformity. Normal range of motion.     Cervical back: Normal range of motion and neck supple.  Skin:    General: Skin is warm and dry.     Findings: No erythema or rash.  Neurological:     Mental Status: He is alert. Mental status is at baseline.     Cranial Nerves: No cranial nerve deficit.     Coordination: Coordination normal.  Psychiatric:     Comments: Flat affect     LABORATORY DATA:  I have reviewed the data as listed Lab Results  Component Value Date   WBC 4.7 12/17/2020   HGB 14.2 12/17/2020   HCT 39.1 12/17/2020   MCV 90.3 12/17/2020   PLT 112 (L) 12/17/2020   Recent Labs    12/04/20 1153 04/04/21 1047  NA 136 136  K 4.1 4.7  CL 101 100  CO2 27 30  GLUCOSE 143* 99  BUN 15 13  CREATININE 0.85 0.67*  CALCIUM 8.6 9.0  GFRNONAA 95  --   GFRAA 110  --   PROT 5.8* 6.2  AST 18 16  ALT 16 17  BILITOT 0.4 0.5    Iron/TIBC/Ferritin/ %Sat No results found for: IRON, TIBC, FERRITIN, IRONPCTSAT    RADIOGRAPHIC STUDIES: I have personally reviewed the radiological images as listed and agreed with the findings in the report.  No results found.   ASSESSMENT & PLAN:  1. Thrombocytopenia ([place])   2. Weight loss    Thrombocytopenia was considered to be due to carbamazepine/Abilify. Labs are reviewed and discussed with patient. Counts have improve today.   Weight loss, about 10 pounds since last visit. Etiology unknown.  TSH was recently checked and was normal.  No adenopahty, no other constitional symptoms.  Observation. No recent colonoscopy in EMR. Recommend to discuss with pcp.  Orders Placed This Encounter  Procedures   CBC with Differential/Platelet    Standing Status:   Future    Standing Expiration Date:   04/15/2022   Comprehensive metabolic panel    Standing Status:   Future    Standing  Expiration Date:   04/15/2022    All questions were answered. The patient knows to call the clinic with any problems questions or concerns.  Cc Steele Sizer, MD  Return of visit: 6 months    Earlie Server, MD, PhD 04/15/2021

## 2021-04-25 ENCOUNTER — Ambulatory Visit (INDEPENDENT_AMBULATORY_CARE_PROVIDER_SITE_OTHER): Payer: Medicare Other | Admitting: Podiatry

## 2021-04-25 ENCOUNTER — Encounter: Payer: Self-pay | Admitting: Podiatry

## 2021-04-25 ENCOUNTER — Other Ambulatory Visit: Payer: Self-pay

## 2021-04-25 DIAGNOSIS — M79674 Pain in right toe(s): Secondary | ICD-10-CM | POA: Diagnosis not present

## 2021-04-25 DIAGNOSIS — M79675 Pain in left toe(s): Secondary | ICD-10-CM

## 2021-04-25 DIAGNOSIS — E119 Type 2 diabetes mellitus without complications: Secondary | ICD-10-CM

## 2021-04-25 DIAGNOSIS — B351 Tinea unguium: Secondary | ICD-10-CM

## 2021-04-25 NOTE — Progress Notes (Signed)
This patient returns to my office for at risk foot care.  This patient requires this care by a professional since this patient will be at risk due to having diabetes and CKD..  This patient is unable to cut nails himself since the patient cannot reach his nails.These nails are painful walking and wearing shoes.  This patient presents for at risk foot care today.  General Appearance  Alert, conversant and in no acute stress.  Vascular  Dorsalis pedis and posterior tibial  pulses are palpable  bilaterally.  Capillary return is within normal limits  bilaterally. Temperature is within normal limits  bilaterally.  Neurologic  Senn-Weinstein monofilament wire test within normal limits  bilaterally. Muscle power within normal limits bilaterally.  Nails Thick disfigured discolored nails with subungual debris  from second  to fifth toes bilaterally. No evidence of bacterial infection or drainage bilaterally.  Orthopedic  No limitations of motion  feet .  No crepitus or effusions noted.  No bony pathology or digital deformities noted.  Skin  normotropic skin with no porokeratosis noted bilaterally.  No signs of infections or ulcers noted.     Onychomycosis  Pain in right toes  Pain in left toes  Consent was obtained for treatment procedures.   Mechanical debridement of nails 2-5  bilaterally performed with a nail nipper.  Filed with dremel without incident.    Return office visit   6 months                   Told patient to return for periodic foot care and evaluation due to potential at risk complications.   Danzel Marszalek DPM  

## 2021-06-10 NOTE — Progress Notes (Signed)
Name: Wayne Jones   MRN: [id number]    DOB: Apr 10, 1961   Date:06/11/2021       Progress Note  Subjective  Chief Complaint  Follow Up  HPI  B12 deficiency: he is taking liquid supplementation. We will recheck level next time.   History of DM: his A1C has been normal for over one year without medication.   He is on statin therapy No symptoms of hypoglycemia. He is losing weight, llast A1C was below 5  HTN: he is off beta blocker, only on low dose Lisinopril  bp today is at goal, no chest pain and no recent episodes of palpitation, but patient is not a big complainer His bp at home has been in the 130's/80's   BPH: he is under the care of Dr. Shirlean Kelly, he states no longer having nocturia, he still masturbates often and causes penile irritation  He is on Proscar and Flomax, last PSA . He was given Cipro for two weeks in August    Dyslipidemia: taking statin and Lovaza. We will recheck lipid panel today    Malnutrition: normal appetite, he continues to lose weigh.  Caregiver states he seems to have an normal appetite. His wait was in the 260 lbs back in 2019, 250 lbs in 2020's and today is down to 231 lbs. He denies blood in stools or change in bowel movements. He is due for colonoscopy, advised Phillips Odor to contact his power of attorney of healthcare, we will place a referral today .   Thrombocytopenia: seeing Dr. Tasia Catchings. Per her notes secondary to Abilify and carbamazepine.   OA knee: he was given knee braces by ortho but has not been wearing it lately, Phillips Odor will discuss it with caregivers at group home    Patient Active Problem List   Diagnosis Date Noted   Osteoarthritis of knee 03/26/2020   Varicose veins, lower extremity, with inflammation, ulcerated, unspecified laterality ([place]) 05/26/2017   Bilateral lower extremity edema 04/14/2017   Thrombocytopenia ([place]) 07/20/2016   Intellectual disability 03/17/2016   Polypharmacy 01/10/2016   Benign prostatic hypertrophy without urinary  obstruction 01/09/2015   Chronic kidney disease (CKD), stage I 01/09/2015   Cognitive decline 01/09/2015   History of diabetes mellitus, type II 01/09/2015   Acid reflux 01/09/2015   Hearing loss 01/09/2015   HLD (hyperlipidemia) 01/09/2015   Morbid obesity, unspecified obesity type ([place]) 01/09/2015   Obstructive sleep apnea 01/09/2015   Other specified causes of urethral stricture 01/09/2015   Hernia of anterior abdominal wall 01/09/2015   Incomplete bladder emptying 07/09/2012   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   Benign essential HTN 02/04/2007   Bipolar I disorder, single manic episode, moderate ([place]) 02/04/2007    Past Surgical History:  Procedure Laterality Date   LAPAROSCOPIC CHOLECYSTECTOMY     LAPAROSCOPY  12/03/2010   VENTRAL HERNIA REPAIR      Family History  Family history unknown: Yes    Social History   Tobacco Use   Smoking status: Never   Smokeless tobacco: Never  Substance Use Topics   Alcohol use: No    Alcohol/week: 0.0 standard drinks     Current Outpatient Medications:    ARIPiprazole (ABILIFY) 20 MG tablet, Take 20 mg by mouth daily., Disp: , Rfl:    atorvastatin (LIPITOR) 10 MG tablet, Take 1 tablet (10 mg total) by mouth daily., Disp: 30 tablet, Rfl: 10   carbamazepine (EQUETRO) 200 MG CP12 12 hr capsule, Take 1 capsule (200 mg total) by mouth 2 (two) times  daily., Disp: 180 each, Rfl: 3   carbamide peroxide (DEBROX) 6.5 % OTIC solution, PLACE 5 DROPS INTO BOTH EARS TWO TIMES A DAY, Disp: 15 mL, Rfl: 0   Cholecalciferol (VITAMIN D3) 1.25 MG (50000 UT) CAPS, Take 1 capsule by mouth every 14 (fourteen) days., Disp: , Rfl:    ciprofloxacin (CIPRO) 500 MG tablet, Take 1 tablet (500 mg total) by mouth 2 (two) times daily., Disp: 28 tablet, Rfl: 0   clonazePAM (KLONOPIN) 1 MG tablet, Take 1 mg by mouth daily., Disp: , Rfl:    Cyanocobalamin 1000 MCG/15ML LIQD, Take 5 mLs (333.3333 mcg total) by mouth daily., Disp: 450 mL, Rfl: 5   DENTA 5000 PLUS 1.1 %  CREA dental cream, Take by mouth., Disp: , Rfl:    Elastic Bandages & Supports (MEDICAL COMPRESSION SOCKS) MISC, 2 each by Does not apply route daily. Apply in am's and remove it at bedtime, Disp: 2 each, Rfl: 1   famotidine (PEPCID) 40 MG tablet, TAKE ONE TABLET BY MOUTH EVERY DAY, Disp: 7 tablet, Rfl: 10   feeding supplement, ENSURE COMPLETE, (ENSURE COMPLETE) LIQD, Take 237 mLs by mouth 2 (two) times daily between meals., Disp: 237 mL, Rfl: 3   finasteride (PROSCAR) 5 MG tablet, Take 5 mg by mouth daily., Disp: , Rfl:    FLUoxetine (PROZAC) 20 MG capsule, TAKE 1 CAPSULE BY MOUTH DAILY, Disp: 7 capsule, Rfl: 10   gabapentin (NEURONTIN) 600 MG tablet, Take 600 mg by mouth at bedtime., Disp: , Rfl:    ketoconazole (NIZORAL) 2 % cream, Apply 1 application topically daily. Groin rash, Disp: 60 g, Rfl: 1   lisinopril (ZESTRIL) 10 MG tablet, TAKE 1 TABLET BY MOUTH ONCE DAILY, Disp: 30 tablet, Rfl: 5   loratadine (ALLERGY RELIEF) 10 MG tablet, TAKE (1) TABLET BY MOUTH DAILY FOR ALLERGY., Disp: 7 tablet, Rfl: 10   Lubricants (K-Y JELLY) GEL, USE  AS NEEDED TO BE ABLE TO MASTURBATE, Disp: 57 g, Rfl: 10   meloxicam (MOBIC) 15 MG tablet, Take 1 tablet (15 mg total) by mouth daily as needed for pain. Please don't give it daily, Disp: 30 tablet, Rfl: 5   nystatin cream (MYCOSTATIN), APPLY TOPICALLY TWO TIMES A DAY, Disp: 30 g, Rfl: 10   omega-3 acid ethyl esters (LOVAZA) 1 g capsule, TAKE 1 CAPSULE BY MOUTH 2 TIMES DAILY, Disp: 62 capsule, Rfl: 10   omeprazole (PRILOSEC) 20 MG capsule, Take daily, Disp: 30 capsule, Rfl: 10   ondansetron (ZOFRAN) 4 MG tablet, TAKE 1 TABLET BY MOUTH EVERY EIGHT HOURS AS NEEDED FOR NAUSEA / VOMITING, Disp: 30 tablet, Rfl: 11   Polyethyl Glyc-Propyl Glyc PF (SYSTANE ULTRA PF) 0.4-0.3 % SOLN, Apply 1 each to eye daily., Disp: 30 each, Rfl: 11   promethazine (PHENERGAN) 25 MG tablet, Take 1 tablet (25 mg total) by mouth every 6 (six) hours as needed for nausea or vomiting.  Discontinue medication , only take zofran prn, Disp: 30 tablet, Rfl: 0   tamsulosin (FLOMAX) 0.4 MG CAPS capsule, Take 1 capsule (0.4 mg total) by mouth daily., Disp: 28 capsule, Rfl: 5   Vitamins A & D (VITAMIN A & D) ointment, APPLY TOPICALLY AS NEEDED FOR DRY SKIN, Disp: 113 g, Rfl: 10  No Known Allergies  I personally reviewed active problem list, medication list, allergies, family history, social history, health maintenance with the patient/caregiver today.   ROS  Constitutional: Negative for fever or weight change.  Respiratory: Negative for cough and shortness of breath.  Cardiovascular: Negative for chest pain or palpitations.  Gastrointestinal: Negative for abdominal pain, no bowel changes.  Musculoskeletal: slow gait due to OA knee for gait but no  joint swelling.  Skin: Negative for rash. Healing scab on umbilical hernia  Neurological: Negative for dizziness or headache.  No other specific complaints in a complete review of systems (except as listed in HPI above).   Objective  Vitals:   06/11/21 0852  BP: 124/82  Pulse: 70  Resp: 16  Temp: 97.6 F (36.4 C)  SpO2: 99%  Weight: 231 lb (104.8 kg)  Height: 6\' 4"  (1.93 m)    Body mass index is 28.12 kg/m.  Physical Exam  Constitutional: Patient appears well-developed and malnutrition. No distress.  HEENT: head atraumatic, normocephalic, pupils equal and reactive to light, neck supple Cardiovascular: Normal rate, regular rhythm and normal heart sounds.  No murmur heard. No BLE edema. Pulmonary/Chest: Effort normal and breath sounds normal. No respiratory distress. Abdominal: Soft.  There is no tenderness. Muscular Skeletal: crepitus with extension of both knees.  Psychiatric: Patient has a normal mood and affect. behavior is normal. Judgment and thought content normal.   Recent Results (from the past 2160 hour(s))  Urinalysis, Complete w Microscopic Urine, Clean Catch     Status: Abnormal   Collection Time:  03/20/21  9:38 AM  Result Value Ref Range   Color, Urine YELLOW YELLOW   APPearance HAZY (A) CLEAR   Specific Gravity, Urine 1.020 1.005 - 1.030   pH 7.0 5.0 - 8.0   Glucose, UA NEGATIVE NEGATIVE mg/dL   Hgb urine dipstick NEGATIVE NEGATIVE   Bilirubin Urine NEGATIVE NEGATIVE   Ketones, ur NEGATIVE NEGATIVE mg/dL   Protein, ur NEGATIVE NEGATIVE mg/dL   Nitrite NEGATIVE NEGATIVE   Leukocytes,Ua NEGATIVE NEGATIVE   Squamous Epithelial / LPF 6-10 0 - 5   WBC, UA 0-5 0 - 5 WBC/hpf   RBC / HPF 0-5 0 - 5 RBC/hpf   Bacteria, UA RARE (A) NONE SEEN    Comment: Performed at [facility], [address], [place], [place]  Lipid panel     Status: None   Collection Time: 04/04/21 10:47 AM  Result Value Ref Range   Cholesterol 126 <200 mg/dL   HDL 63 > OR = 40 mg/dL   Triglycerides 73 06/04/21 mg/dL   LDL Cholesterol (Calc) 48 mg/dL (calc)    Comment: Reference range: <100 . Desirable range <100 mg/dL for primary prevention;   <70 mg/dL for patients with CHD or diabetic patients  with > or = 2 CHD risk factors. <808 LDL-C is now calculated using the Martin-Hopkins  calculation, which is a validated novel method providing  better accuracy than the Friedewald equation in the  estimation of LDL-C.  Marland Kitchen et al. Horald Pollen. Lenox Ahr): 2061-2068  (http://education.[facility].com/faq/FAQ164)    Total CHOL/HDL Ratio 2.0 <5.0 (calc)   Non-HDL Cholesterol (Calc) 63 [birth year]-1971 mg/dL (calc)    Comment: For patients with diabetes plus 1 major ASCVD risk  factor, treating to a non-HDL-C goal of <100 mg/dL  (LDL-C of <391 mg/dL) is considered a therapeutic  option.   COMPLETE METABOLIC PANEL WITH GFR     Status: Abnormal   Collection Time: 04/04/21 10:47 AM  Result Value Ref Range   Glucose, Bld 99 65 - 99 mg/dL    Comment: .            Fasting reference interval .    BUN 13 7 - 25 mg/dL  Creat 0.67 (L) 0.70 - 1.35 mg/dL   eGFR 212 > OR = 60 ZH/SWC/8.53L5    Comment: The  eGFR is based on the CKD-EPI 2021 equation. To calculate  the new eGFR from a previous Creatinine or Cystatin C result, go to https://www.kidney.org/professionals/ kdoqi/gfr%5Fcalculator    BUN/Creatinine Ratio 19 6 - 22 (calc)   Sodium 136 135 - 146 mmol/L   Potassium 4.7 3.5 - 5.3 mmol/L   Chloride 100 98 - 110 mmol/L   CO2 30 20 - 32 mmol/L   Calcium 9.0 8.6 - 10.3 mg/dL   Total Protein 6.2 6.1 - 8.1 g/dL   Albumin 4.3 3.6 - 5.1 g/dL   Globulin 1.9 1.9 - 3.7 g/dL (calc)   AG Ratio 2.3 1.0 - 2.5 (calc)   Total Bilirubin 0.5 0.2 - 1.2 mg/dL   Alkaline phosphatase (APISO) 74 35 - 144 U/L   AST 16 10 - 35 U/L   ALT 17 9 - 46 U/L  PSA     Status: None   Collection Time: 04/04/21 10:47 AM  Result Value Ref Range   PSA 0.13 < OR = 4.00 ng/mL    Comment: The total PSA value from this assay system is  standardized against the WHO standard. The test  result will be approximately 20% lower when compared  to the equimolar-standardized total PSA (Beckman  Coulter). Comparison of serial PSA results should be  interpreted with this fact in mind. . This test was performed using the Siemens  chemiluminescent method. Values obtained from  different assay methods cannot be used interchangeably. PSA levels, regardless of value, should not be interpreted as absolute evidence of the presence or absence of disease.   Hemoglobin A1c     Status: None   Collection Time: 04/04/21 10:47 AM  Result Value Ref Range   Hgb A1c MFr Bld 4.7 <5.7 % of total Hgb    Comment: For the purpose of screening for the presence of diabetes: . <5.7%       Consistent with the absence of diabetes 5.7-6.4%    Consistent with increased risk for diabetes             (prediabetes) > or =6.5%  Consistent with diabetes . This assay result is consistent with a decreased risk of diabetes. . Currently, no consensus exists regarding use of hemoglobin A1c for diagnosis of diabetes in children. . According to American  Diabetes Association (ADA) guidelines, hemoglobin A1c <7.0% represents optimal control in non-pregnant diabetic patients. Different metrics may apply to specific patient populations.  Standards of Medical Care in Diabetes(ADA). .    Mean Plasma Glucose 88 mg/dL   eAG (mmol/L) 4.9 mmol/L      PHQ2/9: Depression screen [facility] 2/9 06/11/2021 04/04/2021 03/06/2021 12/04/2020 09/05/2020  Decreased Interest 0 0 0 0 0  Down, Depressed, Hopeless 0 0 0 0 0  PHQ - 2 Score 0 0 0 0 0  Altered sleeping 0 - - - -  Tired, decreased energy 0 - - - -  Change in appetite 0 - - - -  Feeling bad or failure about yourself  0 - - - -  Trouble concentrating 0 - - - -  Moving slowly or fidgety/restless 0 - - - -  Suicidal thoughts 0 - - - -  PHQ-9 Score 0 - - - -  Difficult doing work/chores - - - - -  Some recent data might be hidden    phq 9 is negative  Fall Risk: Fall Risk  06/11/2021 04/04/2021 03/06/2021 12/04/2020 09/05/2020  Falls in the past year? 0 0 0 0 0  Number falls in past yr: 0 0 0 0 0  Injury with Fall? 0 0 0 0 0  Comment - - - - -  Risk for fall due to : No Fall Risks No Fall Risks - - -  Follow up Falls prevention discussed Falls prevention discussed - - -      Functional Status Survey: Is the patient deaf or have difficulty hearing?: No Does the patient have difficulty seeing, even when wearing glasses/contacts?: No Does the patient have difficulty concentrating, remembering, or making decisions?: Yes Does the patient have difficulty walking or climbing stairs?: No Does the patient have difficulty dressing or bathing?: Yes Does the patient have difficulty doing errands alone such as visiting a doctor's office or shopping?: Yes    Assessment & Plan  1. Moderate protein-calorie malnutrition ([place])  Needs to resume Ensure or another protein supplementation, review high calorie diet and we will refer to colonoscopy   2. Bipolar I disorder, single manic episode, moderate ([facility])  Under  the care of Dr. Tamera Punt  3. Thrombocytopenia ([place])  Under the care of Dr. Tasia Catchings , secondary to psych medication  4. BPH with obstruction/lower urinary tract symptoms   5. Dyslipidemia  On statin therapy and lovaza, at goal   6. B12 deficiency  Continue oral supplementation   7. GERD without esophagitis   8. Benign essential HTN   9. Lower urinary tract symptoms (LUTS)   10. Cognitive decline   11. Colon cancer screening  - Ambulatory referral to Gastroenterology  12. Intellectual disability   13. Primary osteoarthritis of both knees  Resume knee braces

## 2021-06-11 ENCOUNTER — Encounter: Payer: Self-pay | Admitting: Family Medicine

## 2021-06-11 ENCOUNTER — Other Ambulatory Visit: Payer: Self-pay

## 2021-06-11 ENCOUNTER — Ambulatory Visit (INDEPENDENT_AMBULATORY_CARE_PROVIDER_SITE_OTHER): Payer: Medicare Other | Admitting: Family Medicine

## 2021-06-11 VITALS — BP 124/82 | HR 70 | Temp 97.6°F | Resp 16 | Ht 76.0 in | Wt 231.0 lb

## 2021-06-11 DIAGNOSIS — N401 Enlarged prostate with lower urinary tract symptoms: Secondary | ICD-10-CM | POA: Diagnosis not present

## 2021-06-11 DIAGNOSIS — F3012 Manic episode without psychotic symptoms, moderate: Secondary | ICD-10-CM | POA: Diagnosis not present

## 2021-06-11 DIAGNOSIS — D696 Thrombocytopenia, unspecified: Secondary | ICD-10-CM | POA: Diagnosis not present

## 2021-06-11 DIAGNOSIS — E538 Deficiency of other specified B group vitamins: Secondary | ICD-10-CM

## 2021-06-11 DIAGNOSIS — Z23 Encounter for immunization: Secondary | ICD-10-CM | POA: Diagnosis not present

## 2021-06-11 DIAGNOSIS — K219 Gastro-esophageal reflux disease without esophagitis: Secondary | ICD-10-CM | POA: Diagnosis not present

## 2021-06-11 DIAGNOSIS — N138 Other obstructive and reflux uropathy: Secondary | ICD-10-CM

## 2021-06-11 DIAGNOSIS — Z1211 Encounter for screening for malignant neoplasm of colon: Secondary | ICD-10-CM

## 2021-06-11 DIAGNOSIS — F79 Unspecified intellectual disabilities: Secondary | ICD-10-CM

## 2021-06-11 DIAGNOSIS — E440 Moderate protein-calorie malnutrition: Secondary | ICD-10-CM | POA: Diagnosis not present

## 2021-06-11 DIAGNOSIS — I10 Essential (primary) hypertension: Secondary | ICD-10-CM | POA: Diagnosis not present

## 2021-06-11 DIAGNOSIS — E785 Hyperlipidemia, unspecified: Secondary | ICD-10-CM | POA: Diagnosis not present

## 2021-06-11 DIAGNOSIS — R399 Unspecified symptoms and signs involving the genitourinary system: Secondary | ICD-10-CM

## 2021-06-11 DIAGNOSIS — R4189 Other symptoms and signs involving cognitive functions and awareness: Secondary | ICD-10-CM | POA: Diagnosis not present

## 2021-06-11 DIAGNOSIS — F3489 Other specified persistent mood disorders: Secondary | ICD-10-CM | POA: Diagnosis not present

## 2021-06-11 DIAGNOSIS — M170 Bilateral primary osteoarthritis of knee: Secondary | ICD-10-CM

## 2021-06-13 ENCOUNTER — Telehealth: Payer: Self-pay | Admitting: Family Medicine

## 2021-06-13 NOTE — Telephone Encounter (Signed)
Wayne Jones, VP of Wayne Jones is calling to ask Dr. Ancil Boozer if an fax an order for ensure can be sent. Needing to know the type of ensure and the number of calories need for the patient. If unable to fax [name] is willing to pick up the order.  Please advise  CB- [phone]

## 2021-06-14 NOTE — Telephone Encounter (Signed)
Rx written and faxed

## 2021-07-08 ENCOUNTER — Other Ambulatory Visit: Payer: Medicare Other

## 2021-07-08 ENCOUNTER — Ambulatory Visit: Payer: Medicare Other | Admitting: Oncology

## 2021-07-09 ENCOUNTER — Ambulatory Visit (INDEPENDENT_AMBULATORY_CARE_PROVIDER_SITE_OTHER): Payer: Medicare Other

## 2021-07-09 DIAGNOSIS — Z1211 Encounter for screening for malignant neoplasm of colon: Secondary | ICD-10-CM | POA: Diagnosis not present

## 2021-07-09 DIAGNOSIS — Z0000 Encounter for general adult medical examination without abnormal findings: Secondary | ICD-10-CM

## 2021-07-09 NOTE — Patient Instructions (Signed)
Wayne Jones , Thank you for taking time to come for your Medicare Wellness Visit. I appreciate your ongoing commitment to your health goals. Please review the following plan we discussed and let me know if I can assist you in the future.   Screening recommendations/referrals: Colonoscopy: Cologuard done 05/26/18. Ordered today.  Recommended yearly ophthalmology/optometry visit for glaucoma screening and checkup Recommended yearly dental visit for hygiene and checkup  Vaccinations: Influenza vaccine: done 04/04/21 Pneumococcal vaccine: done 05/25/17 Tdap vaccine: done 08/13/17 Shingles vaccine: Shingrix discussed. Please contact your pharmacy for coverage information.  Covid-19: done 09/07/19, 10/06/19 & 06/11/20  Advanced directives: Please bring a copy of your health care power of attorney and living will to the office at your convenience if these documents are available.   Conditions/risks identified: Recommend drinking 6-8 glasses of water per day   Next appointment: Follow up in one year for your annual wellness visit   Preventive Care 40-64 Years, Male Preventive care refers to lifestyle choices and visits with your health care provider that can promote health and wellness. What does preventive care include? A yearly physical exam. This is also called an annual well check. Dental exams once or twice a year. Routine eye exams. Ask your health care provider how often you should have your eyes checked. Personal lifestyle choices, including: Daily care of your teeth and gums. Regular physical activity. Eating a healthy diet. Avoiding tobacco and drug use. Limiting alcohol use. Practicing safe sex. Taking low-dose aspirin every day starting at age 75. What happens during an annual well check? The services and screenings done by your health care provider during your annual well check will depend on your age, overall health, lifestyle risk factors, and family history of disease. Counseling   Your health care provider may ask you questions about your: Alcohol use. Tobacco use. Drug use. Emotional well-being. Home and relationship well-being. Sexual activity. Eating habits. Work and work Statistician. Screening  You may have the following tests or measurements: Height, weight, and BMI. Blood pressure. Lipid and cholesterol levels. These may be checked every 5 years, or more frequently if you are over 95 years old. Skin check. Lung cancer screening. You may have this screening every year starting at age 66 if you have a 30-pack-year history of smoking and currently smoke or have quit within the past 15 years. Fecal occult blood test (FOBT) of the stool. You may have this test every year starting at age 59. Flexible sigmoidoscopy or colonoscopy. You may have a sigmoidoscopy every 5 years or a colonoscopy every 10 years starting at age 33. Prostate cancer screening. Recommendations will vary depending on your family history and other risks. Hepatitis C blood test. Hepatitis B blood test. Sexually transmitted disease (STD) testing. Diabetes screening. This is done by checking your blood sugar (glucose) after you have not eaten for a while (fasting). You may have this done every 1-3 years. Discuss your test results, treatment options, and if necessary, the need for more tests with your health care provider. Vaccines  Your health care provider may recommend certain vaccines, such as: Influenza vaccine. This is recommended every year. Tetanus, diphtheria, and acellular pertussis (Tdap, Td) vaccine. You may need a Td booster every 10 years. Zoster vaccine. You may need this after age 62. Pneumococcal 13-valent conjugate (PCV13) vaccine. You may need this if you have certain conditions and have not been vaccinated. Pneumococcal polysaccharide (PPSV23) vaccine. You may need one or two doses if you smoke cigarettes or if you  have certain conditions. Talk to your health care provider  about which screenings and vaccines you need and how often you need them. This information is not intended to replace advice given to you by your health care provider. Make sure you discuss any questions you have with your health care provider. Document Released: 08/17/2015 Document Revised: 04/09/2016 Document Reviewed: 05/22/2015 Elsevier Interactive Patient Education  [address] Prevention in the Home Falls can cause injuries. They can happen to people of all ages. There are many things you can do to make your home safe and to help prevent falls. What can I do on the outside of my home? Regularly fix the edges of walkways and driveways and fix any cracks. Remove anything that might make you trip as you walk through a door, such as a raised step or threshold. Trim any bushes or trees on the path to your home. Use bright outdoor lighting. Clear any walking paths of anything that might make someone trip, such as rocks or tools. Regularly check to see if handrails are loose or broken. Make sure that both sides of any steps have handrails. Any raised decks and porches should have guardrails on the edges. Have any leaves, snow, or ice cleared regularly. Use sand or salt on walking paths during winter. Clean up any spills in your garage right away. This includes oil or grease spills. What can I do in the bathroom? Use night lights. Install grab bars by the toilet and in the tub and shower. Do not use towel bars as grab bars. Use non-skid mats or decals in the tub or shower. If you need to sit down in the shower, use a plastic, non-slip stool. Keep the floor dry. Clean up any water that spills on the floor as soon as it happens. Remove soap buildup in the tub or shower regularly. Attach bath mats securely with double-sided non-slip rug tape. Do not have throw rugs and other things on the floor that can make you trip. What can I do in the bedroom? Use night lights. Make sure  that you have a light by your bed that is easy to reach. Do not use any sheets or blankets that are too big for your bed. They should not hang down onto the floor. Have a firm chair that has side arms. You can use this for support while you get dressed. Do not have throw rugs and other things on the floor that can make you trip. What can I do in the kitchen? Clean up any spills right away. Avoid walking on wet floors. Keep items that you use a lot in easy-to-reach places. If you need to reach something above you, use a strong step stool that has a grab bar. Keep electrical cords out of the way. Do not use floor polish or wax that makes floors slippery. If you must use wax, use non-skid floor wax. Do not have throw rugs and other things on the floor that can make you trip. What can I do with my stairs? Do not leave any items on the stairs. Make sure that there are handrails on both sides of the stairs and use them. Fix handrails that are broken or loose. Make sure that handrails are as long as the stairways. Check any carpeting to make sure that it is firmly attached to the stairs. Fix any carpet that is loose or worn. Avoid having throw rugs at the top or bottom of the stairs. If you  do have throw rugs, attach them to the floor with carpet tape. Make sure that you have a light switch at the top of the stairs and the bottom of the stairs. If you do not have them, ask someone to add them for you. What else can I do to help prevent falls? Wear shoes that: Do not have high heels. Have rubber bottoms. Are comfortable and fit you well. Are closed at the toe. Do not wear sandals. If you use a stepladder: Make sure that it is fully opened. Do not climb a closed stepladder. Make sure that both sides of the stepladder are locked into place. Ask someone to hold it for you, if possible. Clearly mark and make sure that you can see: Any grab bars or handrails. First and last steps. Where the edge of  each step is. Use tools that help you move around (mobility aids) if they are needed. These include: Canes. Walkers. Scooters. Crutches. Turn on the lights when you go into a dark area. Replace any light bulbs as soon as they burn out. Set up your furniture so you have a clear path. Avoid moving your furniture around. If any of your floors are uneven, fix them. If there are any pets around you, be aware of where they are. Review your medicines with your doctor. Some medicines can make you feel dizzy. This can increase your chance of falling. Ask your doctor what other things that you can do to help prevent falls. This information is not intended to replace advice given to you by your health care provider. Make sure you discuss any questions you have with your health care provider. Document Released: 05/17/2009 Document Revised: 12/27/2015 Document Reviewed: 08/25/2014 Elsevier Interactive Patient Education  2017 [employer].

## 2021-07-09 NOTE — Progress Notes (Signed)
Subjective:   Wayne Jones is a 60 y.o. male who presents for Medicare Annual/Subsequent preventive examination.  Virtual Visit via Telephone Note  I connected with  Wayne Jones on 07/09/21 at  2:10 PM EST by telephone and verified that I am speaking with the correct person using two identifiers.  Location: Patient: home Provider: [place] Persons participating in the virtual visit: patient & caregiver Wayne Jones with Wayne Jones   I discussed the limitations, risks, security and privacy concerns of performing an evaluation and management service by telephone and the availability of in person appointments. The patient expressed understanding and agreed to proceed.  Interactive audio and video telecommunications were attempted between this nurse and patient, however failed, due to patient having technical difficulties OR patient did not have access to video capability.  We continued and completed visit with audio only.  Some vital signs may be absent or patient reported.   Wayne Marker, Wayne Jones   Review of Systems     Cardiac Risk Factors include: advanced age (>35men, >57 women);dyslipidemia;hypertension;male gender;obesity (BMI >30kg/m2)     Objective:    There were no vitals filed for this visit. There is no height or weight on file to calculate BMI.  Advanced Directives 07/09/2021 12/17/2020 12/17/2020 06/21/2020 05/08/2020 05/26/2017 03/24/2017  Does Patient Have a Medical Advance Directive? (No Data) No Unable to assess, patient is non-responsive or altered mental status Unable to assess, patient is non-responsive or altered mental status No Yes No  Type of Advance Directive - - - - - Press photographer -  Does patient want to make changes to medical advance directive? - - - - - - -  Copy of Wayne Jones in Chart? - - - - - - -    Current Medications (verified) Outpatient Encounter Medications as of 07/09/2021   Medication Sig   ARIPiprazole (ABILIFY) 20 MG tablet Take 20 mg by mouth daily.   atorvastatin (LIPITOR) 10 MG tablet Take 1 tablet (10 mg total) by mouth daily.   carbamazepine (EQUETRO) 200 MG CP12 12 hr capsule Take 1 capsule (200 mg total) by mouth 2 (two) times daily.   carbamide peroxide (DEBROX) 6.5 % OTIC solution PLACE 5 DROPS INTO BOTH EARS TWO TIMES A DAY   Cholecalciferol (VITAMIN D3) 1.25 MG (50000 UT) CAPS Take 1 capsule by mouth every 14 (fourteen) days.   clonazePAM (KLONOPIN) 1 MG tablet Take 1 mg by mouth daily.   Cyanocobalamin 1000 MCG/15ML LIQD Take 5 mLs (333.3333 mcg total) by mouth daily.   DENTA 5000 PLUS 1.1 % CREA dental cream Take by mouth.   Elastic Bandages & Supports (MEDICAL COMPRESSION SOCKS) MISC 2 each by Does not apply route daily. Apply in am's and remove it at bedtime   famotidine (PEPCID) 40 MG tablet TAKE ONE TABLET BY MOUTH EVERY DAY   feeding supplement, ENSURE COMPLETE, (ENSURE COMPLETE) LIQD Take 237 mLs by mouth 2 (two) times daily between meals.   finasteride (PROSCAR) 5 MG tablet Take 5 mg by mouth daily.   FLUoxetine (PROZAC) 20 MG capsule TAKE 1 CAPSULE BY MOUTH DAILY   gabapentin (NEURONTIN) 600 MG tablet Take 600 mg by mouth at bedtime.   ketoconazole (NIZORAL) 2 % cream Apply 1 application topically daily. Groin rash   lisinopril (ZESTRIL) 10 MG tablet TAKE 1 TABLET BY MOUTH ONCE DAILY   loratadine (ALLERGY RELIEF) 10 MG tablet TAKE (1) TABLET BY MOUTH DAILY FOR ALLERGY.  Lubricants (K-Y JELLY) GEL USE  AS NEEDED TO BE ABLE TO MASTURBATE   meloxicam (MOBIC) 15 MG tablet Take 1 tablet (15 mg total) by mouth daily as needed for pain. Please don't give it daily   nystatin cream (MYCOSTATIN) APPLY TOPICALLY TWO TIMES A DAY   omega-3 acid ethyl esters (LOVAZA) 1 g capsule TAKE 1 CAPSULE BY MOUTH 2 TIMES DAILY   omeprazole (PRILOSEC) 20 MG capsule Take daily   ondansetron (ZOFRAN) 4 MG tablet TAKE 1 TABLET BY MOUTH EVERY EIGHT HOURS AS NEEDED  FOR NAUSEA / VOMITING   Polyethyl Glyc-Propyl Glyc PF (SYSTANE ULTRA PF) 0.4-0.3 % SOLN Apply 1 each to eye daily.   tamsulosin (FLOMAX) 0.4 MG CAPS capsule Take 1 capsule (0.4 mg total) by mouth daily.   Vitamins A & D (VITAMIN A & D) ointment APPLY TOPICALLY AS NEEDED FOR DRY SKIN   [DISCONTINUED] promethazine (PHENERGAN) 25 MG tablet Take 1 tablet (25 mg total) by mouth every 6 (six) hours as needed for nausea or vomiting. Discontinue medication , only take zofran prn   No facility-administered encounter medications on file as of 07/09/2021.    Allergies (verified) Patient has no known allergies.   History: Past Medical History:  Diagnosis Date   Depression    Diabetes mellitus without complication ([place])    Hyperlipidemia    Hypertension    Mental disorder    Past Surgical History:  Procedure Laterality Date   LAPAROSCOPIC CHOLECYSTECTOMY     LAPAROSCOPY  12/03/2010   VENTRAL HERNIA REPAIR     Family History  Family history unknown: Yes   Social History   Socioeconomic History   Marital status: Single    Spouse name: Not on file   Number of children: Not on file   Years of education: Not on file   Highest education level: Not on file  Occupational History   Occupation: disabled    Comment: Patient in care home  Tobacco Use   Smoking status: Never   Smokeless tobacco: Never  Vaping Use   Vaping Use: Never used  Substance and Sexual Activity   Alcohol use: No    Alcohol/week: 0.0 standard drinks   Drug use: No   Sexual activity: Never  Other Topics Concern   Not on file  Social History Narrative   Wayne Jones calls him twice weekly. Pt resides at Wayne Jones group home at [employer]    Social Determinants of Health   Financial Resource Strain: Low Risk    Difficulty of Paying Living Expenses: Not hard at all  Food Insecurity: No Food Insecurity   Worried About Charity fundraiser in the Last Year: Never true   Arboriculturist in the Last Year: Never true   Transportation Needs: No Transportation Needs   Lack of Transportation (Medical): No   Lack of Transportation (Non-Medical): No  Physical Activity: Insufficiently Active   Days of Exercise per Week: 3 days   Minutes of Exercise per Session: 30 min  Stress: No Stress Concern Present   Feeling of Stress : Not at all  Social Connections: Socially Isolated   Frequency of Communication with Friends and Family: Three times a week   Frequency of Social Gatherings with Friends and Family: Once a week   Attends Religious Services: Never   Marine scientist or Organizations: No   Attends Archivist Meetings: Never   Marital Status: Never married    Tobacco Counseling Counseling given: Not  Answered   Clinical Intake:  Pre-visit preparation completed: Yes  Pain : No/denies pain     Nutritional Risks: None Diabetes: No  How often do you need to have someone help you when you read instructions, pamphlets, or other written materials from your doctor or pharmacy?: 5 - Always    Interpreter Needed?: No  Information entered by :: Wayne Marker Wayne Jones   Activities of Daily Living In your present state of health, do you have any difficulty performing the following activities: 07/09/2021 06/11/2021  Hearing? Y N  Vision? N N  Difficulty concentrating or making decisions? Tempie Donning  Walking or climbing stairs? N N  Dressing or bathing? Y Y  Doing errands, shopping? Tempie Donning  Preparing Food and eating ? N -  Using the Toilet? N -  In the past six months, have you accidently leaked urine? N -  Do you have problems with loss of bowel control? N -  Managing your Medications? Y -  Managing your Finances? Y -  Housekeeping or managing your Housekeeping? Y -  Some recent data might be hidden    Patient Care Team: Steele Sizer, MD as PCP - General (Family Medicine) Alvy Bimler, MD as Referring Physician (Psychiatry) Gardiner Barefoot, DPM as Consulting Physician  (Podiatry) Delfin Edis, MD as Referring Physician (Urology) Earlie Server, MD as Consulting Physician (Hematology) Kate Sable, MD as Consulting Physician (Cardiology)  Indicate any recent Medical Services you may have received from other than [facility] providers in the past year (date may be approximate).     Assessment:   This is a routine wellness examination for Philippe.  Hearing/Vision screen Hearing Screening - Comments:: Caregiver reports mild hearing difficulty Vision Screening - Comments:: Annual vision screenings done at [facility]  Dietary issues and exercise activities discussed: Current Exercise Habits: Home exercise routine, Type of exercise: walking, Time (Minutes): 30, Intensity: Mild, Exercise limited by: None identified   Goals Addressed             This Visit's Progress    Exercise 3x per week (30 min per time)   On track      Depression Screen PHQ 2/9 Scores 07/09/2021 06/11/2021 04/04/2021 03/06/2021 12/04/2020 09/05/2020 06/21/2020  PHQ - 2 Score 0 0 0 0 0 0 0  PHQ- 9 Score - 0 - - - - -  Exception Documentation - - - - - - -    Fall Risk Fall Risk  07/09/2021 06/11/2021 04/04/2021 03/06/2021 12/04/2020  Falls in the past year? 0 0 0 0 0  Number falls in past yr: 0 0 0 0 0  Injury with Fall? 0 0 0 0 0  Comment - - - - -  Risk for fall due to : No Fall Risks No Fall Risks No Fall Risks - -  Follow up Falls prevention discussed Falls prevention discussed Falls prevention discussed - -    FALL RISK PREVENTION PERTAINING TO THE HOME:  Any stairs in or around the home? Yes  If so, are there any without handrails? No  Home free of loose throw rugs in walkways, pet beds, electrical cords, etc? Yes  Adequate lighting in your home to reduce risk of falls? Yes   ASSISTIVE DEVICES UTILIZED TO PREVENT FALLS:  Life alert? No  Use of a cane, walker or w/c? No  Grab bars in the bathroom? Yes  Shower chair or bench in shower? Yes  Elevated toilet seat  or a handicapped toilet? Yes  TIMED UP AND GO:  Was the test performed? No . Telephonic visit.   Cognitive Function: Cognitive status assessed by direct observation. Patient has current diagnosis of cognitive impairment. Patient is unable to complete screening 6CIT or MMSE.          Immunizations Immunization History  Administered Date(s) Administered   Influenza, Seasonal, Injecte, Preservative Fre 05/30/2010, 05/28/2011   Influenza,inj,Quad PF,6+ Mos 04/13/2013, 04/24/2014, 03/14/2015, 06/13/2016, 05/20/2017, 03/26/2018, 06/04/2018, 04/20/2019, 04/03/2020, 04/04/2021   Influenza,inj,quad, With Preservative 05/05/2019   Moderna Sars-Covid-2 Vaccination 09/07/2019, 10/06/2019, 06/11/2020   Pneumococcal Conjugate-13 05/25/2017   Pneumococcal Polysaccharide-23 03/31/2012, 03/17/2016   Tdap 03/17/2016, 10/05/2016, 08/13/2017    TDAP status: Up to date  Flu Vaccine status: Up to date  Pneumococcal vaccine status: Up to date  Covid-19 vaccine status: Completed vaccines  Qualifies for Shingles Vaccine? Yes   Zostavax completed No   Shingrix Completed?: No.    Education has been provided regarding the importance of this vaccine. Patient has been advised to call insurance company to determine out of pocket expense if they have not yet received this vaccine. Advised may also receive vaccine at local pharmacy or Health Dept. Verbalized acceptance and understanding.  Screening Tests Health Maintenance  Topic Date Due   HIV Screening  Never done   Zoster Vaccines- Shingrix (1 of 2) Never done   COVID-19 Vaccine (4 - Booster for Moderna series) 08/06/2020   Fecal DNA (Cologuard)  05/26/2021   Pneumococcal Vaccine 66-48 Years old (4 - PPSV23 if available, else PCV20) 10/16/2025   TETANUS/TDAP  08/14/2027   INFLUENZA VACCINE  Completed   Hepatitis C Screening  Completed   HPV VACCINES  Aged Out    Health Maintenance  Health Maintenance Due  Topic Date Due   HIV Screening   Never done   Zoster Vaccines- Shingrix (1 of 2) Never done   COVID-19 Vaccine (4 - Booster for Moderna series) 08/06/2020   Fecal DNA (Cologuard)  05/26/2021    Colorectal cancer screening: Type of screening: Cologuard. Completed 05/26/18. Repeat every 3 years. Ordered today  Lung Cancer Screening: (Low Dose CT Chest recommended if Age 83-80 years, 30 pack-year currently smoking OR have quit w/in 15years.) does not qualify.   Additional Screening:  Hepatitis C Screening: does qualify; Completed 12/04/20  Vision Screening: Recommended annual ophthalmology exams for early detection of glaucoma and other disorders of the eye. Is the patient up to date with their annual eye exam?  Yes  Who is the provider or what is the name of the office in which the patient attends annual eye exams? [facility].   Dental Screening: Recommended annual dental exams for proper oral hygiene  Community Resource Referral / Chronic Care Management: CRR required this visit?  No   CCM required this visit?  No      Plan:     I have personally reviewed and noted the following in the patient's chart:   Medical and social history Use of alcohol, tobacco or illicit drugs  Current medications and supplements including opioid prescriptions. Patient is not currently taking opioid prescriptions. Functional ability and status Nutritional status Physical activity Advanced directives List of other physicians Hospitalizations, surgeries, and ER visits in previous 12 months Vitals Screenings to include cognitive, depression, and falls Referrals and appointments  In addition, I have reviewed and discussed with patient certain preventive protocols, quality metrics, and best practice recommendations. A written personalized care plan for preventive services as well as general preventive health recommendations were  provided to patient.     Wayne Marker, Wayne Jones   [id number]   Nurse Notes: none

## 2021-07-15 DIAGNOSIS — Z1211 Encounter for screening for malignant neoplasm of colon: Secondary | ICD-10-CM | POA: Diagnosis not present

## 2021-07-22 DIAGNOSIS — E039 Hypothyroidism, unspecified: Secondary | ICD-10-CM | POA: Diagnosis not present

## 2021-07-22 DIAGNOSIS — E785 Hyperlipidemia, unspecified: Secondary | ICD-10-CM | POA: Diagnosis not present

## 2021-07-22 DIAGNOSIS — Z79899 Other long term (current) drug therapy: Secondary | ICD-10-CM | POA: Diagnosis not present

## 2021-07-22 DIAGNOSIS — I10 Essential (primary) hypertension: Secondary | ICD-10-CM | POA: Diagnosis not present

## 2021-07-22 DIAGNOSIS — Z7982 Long term (current) use of aspirin: Secondary | ICD-10-CM | POA: Diagnosis not present

## 2021-07-22 DIAGNOSIS — R338 Other retention of urine: Secondary | ICD-10-CM | POA: Diagnosis not present

## 2021-07-22 DIAGNOSIS — N401 Enlarged prostate with lower urinary tract symptoms: Secondary | ICD-10-CM | POA: Diagnosis not present

## 2021-07-22 DIAGNOSIS — F319 Bipolar disorder, unspecified: Secondary | ICD-10-CM | POA: Diagnosis not present

## 2021-07-22 DIAGNOSIS — R339 Retention of urine, unspecified: Secondary | ICD-10-CM | POA: Diagnosis not present

## 2021-07-22 DIAGNOSIS — Z125 Encounter for screening for malignant neoplasm of prostate: Secondary | ICD-10-CM | POA: Diagnosis not present

## 2021-07-22 LAB — COLOGUARD: COLOGUARD: POSITIVE — AB

## 2021-09-03 ENCOUNTER — Ambulatory Visit: Payer: Self-pay | Admitting: *Deleted

## 2021-09-03 NOTE — Telephone Encounter (Signed)
°  Chief Complaint: "Hurts down there" Symptoms: Pt states yes when asked if hurts when he urinates. Frequency: Per caregiver, "Off and on for months" Pertinent Negatives: Patient denies fever, discharge, swelling, hematuria Disposition: [] ED /[] Urgent Care (no appt availability in office) / [x] Appointment(In office/virtual)/ []  [facility]/ [] Home Care/ [] Refused Recommended Disposition /[] [facility]/ []  Follow-up with PCP Additional Notes: Pt with cognitive deficits. Caregiver reports pt just keeps saying "Hurts down there." Appt made for tomorrow with Dr. Rosana Berger.      Reason for Disposition  All other penis - scrotum symptoms  (Exception: painless rash < 24 hours duration)  Answer Assessment - Initial Assessment Questions 1. SYMPTOM: "What's the main symptom you're concerned about?" (e.g., discharge from penis, rash, pain, itching, swelling)     None 2. LOCATION: "Where is the located?"     Unsure. Penis and scrotum maybe 3. ONSET: "When did start?"     Months ago, off and on 4. PAIN: "Is there any pain?" If Yes, ask: "How bad is it?"  (Scale 1-10; or mild, moderate, severe)     "Pt just says hurts down there." 5. URINE: "Any difficulty passing urine?" If Yes, ask: "When was the last time?"     HAd time starting flow. Off and on for months 6. CAUSE: "What do you think is causing the symptoms?"     Unsure 7. OTHER SYMPTOMS: "Do you have any other symptoms?" (e.g., fever, abdominal pain, blood in urine)     no  Protocols used: Penis and Scrotum Symptoms-A-AH

## 2021-09-04 ENCOUNTER — Encounter: Payer: Self-pay | Admitting: Internal Medicine

## 2021-09-04 ENCOUNTER — Ambulatory Visit (INDEPENDENT_AMBULATORY_CARE_PROVIDER_SITE_OTHER): Payer: Medicare Other | Admitting: Internal Medicine

## 2021-09-04 ENCOUNTER — Other Ambulatory Visit: Payer: Self-pay

## 2021-09-04 VITALS — BP 126/84 | HR 68 | Temp 97.8°F | Resp 16 | Ht 76.0 in | Wt 225.2 lb

## 2021-09-04 DIAGNOSIS — R300 Dysuria: Secondary | ICD-10-CM | POA: Diagnosis not present

## 2021-09-04 DIAGNOSIS — L97909 Non-pressure chronic ulcer of unspecified part of unspecified lower leg with unspecified severity: Secondary | ICD-10-CM

## 2021-09-04 DIAGNOSIS — R600 Localized edema: Secondary | ICD-10-CM

## 2021-09-04 DIAGNOSIS — N4889 Other specified disorders of penis: Secondary | ICD-10-CM

## 2021-09-04 DIAGNOSIS — I83209 Varicose veins of unspecified lower extremity with both ulcer of unspecified site and inflammation: Secondary | ICD-10-CM

## 2021-09-04 DIAGNOSIS — J309 Allergic rhinitis, unspecified: Secondary | ICD-10-CM | POA: Diagnosis not present

## 2021-09-04 LAB — POCT URINALYSIS DIPSTICK
Bilirubin, UA: NEGATIVE
Blood, UA: NEGATIVE
Glucose, UA: NEGATIVE
Ketones, UA: NEGATIVE
Leukocytes, UA: NEGATIVE
Nitrite, UA: NEGATIVE
Protein, UA: NEGATIVE
Spec Grav, UA: 1.02 (ref 1.010–1.025)
Urobilinogen, UA: 0.2 E.U./dL
pH, UA: 5 (ref 5.0–8.0)

## 2021-09-04 MED ORDER — FEXOFENADINE HCL 60 MG PO TABS: 60.0000 mg | ORAL_TABLET | Freq: Two times a day (BID) | ORAL | 3 refills | Status: DC

## 2021-09-04 MED ORDER — FLUTICASONE PROPIONATE 50 MCG/ACT NA SUSP: 2.0000 | Freq: Every day | NASAL | 6 refills | Status: DC

## 2021-09-04 MED ORDER — MEDICAL COMPRESSION SOCKS MISC: 2.0000 | Freq: Every day | 1 refills | Status: AC

## 2021-09-04 NOTE — Progress Notes (Signed)
Acute Office Visit  Subjective:    Patient ID: Wayne Jones, male    DOB: Oct 31, 1960, 61 y.o.   MRN: [id number]  Chief Complaint  Patient presents with   Dysuria    Cannot urinate at times   Allergic Rhinitis    Medication Refill    Script for compression stockings    HPI Patient is in today for dysuria/penile pain. He is accompanied by his caregiver.  URINARY SYMPTOMS Dysuria: burning Urinary frequency: no Urgency: no Small volume voids: no Urinary incontinence: no Foul odor: no Hematuria: no Abdominal pain: yes Suprapubic pain/pressure: yes Fever:  no Vomiting: no Penile discharge: no Treatments attempted: none  Of note, he has lately been pulling on his penis more and complaining of consistent pain at the tip of his penis. He denies skin changes, rash or discharge. He had been previously seen by Urology last month who noted no abnormalities. He is on Flomax as well.    Allergic Rhinitis: -Having come clear nasal discharge over last few days, no cough, fever, congestion. -Currently on Claritin daily   Past Medical History:  Diagnosis Date   Depression    Diabetes mellitus without complication ([place])    Hyperlipidemia    Hypertension    Mental disorder     Past Surgical History:  Procedure Laterality Date   LAPAROSCOPIC CHOLECYSTECTOMY     LAPAROSCOPY  12/03/2010   VENTRAL HERNIA REPAIR      Family History  Family history unknown: Yes    Social History   Socioeconomic History   Marital status: Single    Spouse name: Not on file   Number of children: Not on file   Years of education: Not on file   Highest education level: Not on file  Occupational History   Occupation: disabled    Comment: Patient in care home  Tobacco Use   Smoking status: Never   Smokeless tobacco: Never  Vaping Use   Vaping Use: Never used  Substance and Sexual Activity   Alcohol use: No    Alcohol/week: 0.0 standard drinks   Drug use: No   Sexual activity: Never   Other Topics Concern   Not on file  Social History Narrative   [name] calls him twice weekly. Pt resides at [facility] group home at [employer]    Social Determinants of Health   Financial Resource Strain: Low Risk    Difficulty of Paying Living Expenses: Not hard at all  Food Insecurity: No Food Insecurity   Worried About Charity fundraiser in the Last Year: Never true   Arboriculturist in the Last Year: Never true  Transportation Needs: No Transportation Needs   Lack of Transportation (Medical): No   Lack of Transportation (Non-Medical): No  Physical Activity: Insufficiently Active   Days of Exercise per Week: 3 days   Minutes of Exercise per Session: 30 min  Stress: No Stress Concern Present   Feeling of Stress : Not at all  Social Connections: Socially Isolated   Frequency of Communication with Friends and Family: Three times a week   Frequency of Social Gatherings with Friends and Family: Once a week   Attends Religious Services: Never   Marine scientist or Organizations: No   Attends Archivist Meetings: Never   Marital Status: Never married  Human resources officer Violence: Not At Risk   Fear of Current or Ex-Partner: No   Emotionally Abused: No   Physically Abused: No  Sexually Abused: No    Outpatient Medications Prior to Visit  Medication Sig Dispense Refill   ARIPiprazole (ABILIFY) 20 MG tablet Take 20 mg by mouth daily.     atorvastatin (LIPITOR) 10 MG tablet Take 1 tablet (10 mg total) by mouth daily. 30 tablet 10   carbamazepine (EQUETRO) 200 MG CP12 12 hr capsule Take 1 capsule (200 mg total) by mouth 2 (two) times daily. 180 each 3   carbamide peroxide (DEBROX) 6.5 % OTIC solution PLACE 5 DROPS INTO BOTH EARS TWO TIMES A DAY 15 mL 0   Cholecalciferol (VITAMIN D3) 1.25 MG (50000 UT) CAPS Take 1 capsule by mouth every 14 (fourteen) days.     clonazePAM (KLONOPIN) 1 MG tablet Take 1 mg by mouth daily.     Cyanocobalamin 1000 MCG/15ML LIQD  Take 5 mLs (333.3333 mcg total) by mouth daily. 450 mL 5   DENTA 5000 PLUS 1.1 % CREA dental cream Take by mouth.     Elastic Bandages & Supports (MEDICAL COMPRESSION SOCKS) MISC 2 each by Does not apply route daily. Apply in am's and remove it at bedtime 2 each 1   famotidine (PEPCID) 40 MG tablet TAKE ONE TABLET BY MOUTH EVERY DAY 7 tablet 10   feeding supplement, ENSURE COMPLETE, (ENSURE COMPLETE) LIQD Take 237 mLs by mouth 2 (two) times daily between meals. 237 mL 3   finasteride (PROSCAR) 5 MG tablet Take 5 mg by mouth daily.     FLUoxetine (PROZAC) 20 MG capsule TAKE 1 CAPSULE BY MOUTH DAILY 7 capsule 10   gabapentin (NEURONTIN) 600 MG tablet Take 600 mg by mouth at bedtime.     ketoconazole (NIZORAL) 2 % cream Apply 1 application topically daily. Groin rash 60 g 1   lisinopril (ZESTRIL) 10 MG tablet TAKE 1 TABLET BY MOUTH ONCE DAILY 30 tablet 5   loratadine (ALLERGY RELIEF) 10 MG tablet TAKE (1) TABLET BY MOUTH DAILY FOR ALLERGY. 7 tablet 10   Lubricants (K-Y JELLY) GEL USE  AS NEEDED TO BE ABLE TO MASTURBATE 57 g 10   meloxicam (MOBIC) 15 MG tablet Take 1 tablet (15 mg total) by mouth daily as needed for pain. Please don't give it daily 30 tablet 5   nystatin cream (MYCOSTATIN) APPLY TOPICALLY TWO TIMES A DAY 30 g 10   omega-3 acid ethyl esters (LOVAZA) 1 g capsule TAKE 1 CAPSULE BY MOUTH 2 TIMES DAILY 62 capsule 10   omeprazole (PRILOSEC) 20 MG capsule Take daily 30 capsule 10   ondansetron (ZOFRAN) 4 MG tablet TAKE 1 TABLET BY MOUTH EVERY EIGHT HOURS AS NEEDED FOR NAUSEA / VOMITING 30 tablet 11   Polyethyl Glyc-Propyl Glyc PF (SYSTANE ULTRA PF) 0.4-0.3 % SOLN Apply 1 each to eye daily. 30 each 11   tamsulosin (FLOMAX) 0.4 MG CAPS capsule Take 1 capsule (0.4 mg total) by mouth daily. 28 capsule 5   Vitamins A & D (VITAMIN A & D) ointment APPLY TOPICALLY AS NEEDED FOR DRY SKIN 113 g 10   No facility-administered medications prior to visit.    No Known Allergies  Review of Systems   Constitutional:  Negative for activity change, appetite change, chills and fever.  Gastrointestinal:  Negative for abdominal pain, nausea and vomiting.  Genitourinary:  Positive for difficulty urinating, dysuria and penile pain. Negative for flank pain, frequency, genital sores, hematuria, penile discharge, penile swelling, scrotal swelling, testicular pain and urgency.  Skin: Negative.       Objective:    Physical Exam  Exam conducted with a chaperone present.  Constitutional:      Appearance: Normal appearance.  HENT:     Head: Normocephalic and atraumatic.  Eyes:     Conjunctiva/sclera: Conjunctivae normal.  Cardiovascular:     Rate and Rhythm: Normal rate and regular rhythm.  Pulmonary:     Effort: Pulmonary effort is normal.     Breath sounds: Normal breath sounds.  Genitourinary:    Pubic Area: No rash.      Penis: Normal and circumcised. No erythema, tenderness, discharge, swelling or lesions.      Testes: Normal.        Right: Mass, tenderness or swelling not present.        Left: Mass, tenderness or swelling not present.  Musculoskeletal:     Right lower leg: No edema.     Left lower leg: No edema.  Skin:    General: Skin is warm and dry.  Neurological:     Mental Status: He is alert. Mental status is at baseline.  Psychiatric:        Mood and Affect: Mood normal.        Behavior: Behavior normal.    BP 126/84    Pulse 68    Temp 97.8 F (36.6 C) (Oral)    Resp 16    Ht $R'6\' 4"'jA$  (1.93 m)    Wt 225 lb 3.2 oz (102.2 kg)    SpO2 99%    BMI 27.41 kg/m  Wt Readings from Last 3 Encounters:  06/11/21 231 lb (104.8 kg)  04/15/21 236 lb 1.6 oz (107.1 kg)  04/04/21 233 lb (105.7 kg)    Health Maintenance Due  Topic Date Due   HIV Screening  Never done   Zoster Vaccines- Shingrix (1 of 2) Never done   COVID-19 Vaccine (4 - Booster for Moderna series) 08/06/2020    There are no preventive care reminders to display for this patient.   Lab Results  Component Value  Date   TSH 0.56 12/04/2020   Lab Results  Component Value Date   WBC 4.7 12/17/2020   HGB 14.2 12/17/2020   HCT 39.1 12/17/2020   MCV 90.3 12/17/2020   PLT 112 (L) 12/17/2020   Lab Results  Component Value Date   NA 136 04/04/2021   K 4.7 04/04/2021   CO2 30 04/04/2021   GLUCOSE 99 04/04/2021   BUN 13 04/04/2021   CREATININE 0.67 (L) 04/04/2021   BILITOT 0.5 04/04/2021   ALKPHOS 117 (H) 07/18/2016   AST 16 04/04/2021   ALT 17 04/04/2021   PROT 6.2 04/04/2021   ALBUMIN 4.1 07/18/2016   CALCIUM 9.0 04/04/2021   ANIONGAP 7 04/10/2013   EGFR 107 04/04/2021   Lab Results  Component Value Date   CHOL 126 04/04/2021   Lab Results  Component Value Date   HDL 63 04/04/2021   Lab Results  Component Value Date   LDLCALC 48 04/04/2021   Lab Results  Component Value Date   TRIG 73 04/04/2021   Lab Results  Component Value Date   CHOLHDL 2.0 04/04/2021   Lab Results  Component Value Date   HGBA1C 4.7 04/04/2021       Assessment & Plan:   1. Dysuria/ Pain, penile: UA negative in the office. Exam normal. Discussed good personal hygiene. Follow up as needed.  - POCT Urinalysis Dipstick  2. Allergic rhinitis, unspecified seasonality, unspecified trigger: Switch from Claritin to Allegra and start Flonase and nasal saline.   -  fexofenadine (ALLEGRA ALLERGY) 60 MG tablet; Take 1 tablet (60 mg total) by mouth 2 (two) times daily.  Dispense: 90 tablet; Refill: 3 - fluticasone (FLONASE) 50 MCG/ACT nasal spray; Place 2 sprays into both nostrils daily.  Dispense: 16 g; Refill: 6  3. Varicose veins, lower extremity, with inflammation, ulcerated, unspecified laterality (HCC)/Edema of both legs: Compression stocking reordered today.  - Elastic Bandages & Supports (MEDICAL COMPRESSION SOCKS) MISC; 2 each by Does not apply route daily. Apply in am's and remove it at bedtime  Dispense: 2 each; Refill: [place], DO

## 2021-09-04 NOTE — Patient Instructions (Signed)
It was great seeing you today!  Plan discussed at today's visit: -Urine looks good today, no signs of infection, no skin changes -Try to keep area clean -For allergies, stop Claritin and switch to Allegra -Use nasal saline and Flonase as well for runny nose -Compression stockings sent as well  Follow up in: as needed.  Take care and let us know if you have any questions or concerns prior to your next visit.  Dr. Rosana Berger

## 2021-09-06 ENCOUNTER — Telehealth: Payer: Self-pay | Admitting: Family Medicine

## 2021-09-06 NOTE — Telephone Encounter (Signed)
Clarified and completed

## 2021-09-06 NOTE — Telephone Encounter (Signed)
Pharmacy needs a discontinue notice for Claritin / please advise

## 2021-09-09 ENCOUNTER — Other Ambulatory Visit: Payer: Self-pay | Admitting: Family Medicine

## 2021-09-09 DIAGNOSIS — N481 Balanitis: Secondary | ICD-10-CM

## 2021-09-10 ENCOUNTER — Other Ambulatory Visit: Payer: Self-pay | Admitting: Family Medicine

## 2021-09-12 NOTE — Progress Notes (Signed)
Name: Wayne Jones   MRN: [id number]    DOB: 27-Dec-1960   Date:09/13/2021       Progress Note  Subjective  Chief Complaint  Follow Up  HPI  History of DM: his A1C has been normal for over one year without medication.   He is on statin therapy No symptoms of hypoglycemia. He is losing weight, last A1C was below 5. We will continue to monitor   HTN: he is off beta blocker, only on low dose Lisinopril  bp today is at goal, no chest pain and no recent episodes of palpitation. BPH: he is under the care of Dr. Shirlean Kelly, he states no longer having nocturia, he still masturbates often and causes penile irritation, caregiver Seth Bake states he usually does not have an erection and pulls his penis like a rubber band, it looks painful and seems to continue to irritated his genitalia.  He is on Proscar and Flomax, last PSA .    Dyslipidemia: taking statin and Lovaza. LDL is at goal    Malnutrition: normal appetite, he continues to lose weigh.  Caregiver states he seems to have an normal appetite. His wait was in the 260 lbs back in 2019, 250 lbs in 2020's he was  to 231 lbs end of 2022 and today is down to 226 lbs. He denies blood in stools or change in bowel movements, however her had a positive Cologuard. Gave caregiver the number for [facility] so he can have a colonoscopy to rule out colon cancer . Caregiver states he complains that his stomach hurts and he vomits a couple times a month and he vomits.   Thrombocytopenia: seeing Dr. Tasia Catchings. Per her notes secondary to Abilify and carbamazepine. Unchanged   OA knee: he was given knee braces by ortho but has not been wearing it lately, caregiver states he does not seem to be in pain    Patient Active Problem List   Diagnosis Date Noted   Osteoarthritis of knee 03/26/2020   Varicose veins, lower extremity, with inflammation, ulcerated, unspecified laterality ([place]) 05/26/2017   Bilateral lower extremity edema 04/14/2017   Thrombocytopenia ([place])  07/20/2016   Intellectual disability 03/17/2016   Polypharmacy 01/10/2016   Benign prostatic hypertrophy without urinary obstruction 01/09/2015   Chronic kidney disease (CKD), stage I 01/09/2015   Cognitive decline 01/09/2015   History of diabetes mellitus, type II 01/09/2015   Acid reflux 01/09/2015   Hearing loss 01/09/2015   HLD (hyperlipidemia) 01/09/2015   Morbid obesity, unspecified obesity type ([place]) 01/09/2015   Obstructive sleep apnea 01/09/2015   Other specified causes of urethral stricture 01/09/2015   Hernia of anterior abdominal wall 01/09/2015   Incomplete bladder emptying 07/09/2012   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   Benign essential HTN 02/04/2007   Bipolar I disorder, single manic episode, moderate ([place]) 02/04/2007    Past Surgical History:  Procedure Laterality Date   LAPAROSCOPIC CHOLECYSTECTOMY     LAPAROSCOPY  12/03/2010   VENTRAL HERNIA REPAIR      Family History  Family history unknown: Yes    Social History   Tobacco Use   Smoking status: Never   Smokeless tobacco: Never  Substance Use Topics   Alcohol use: No    Alcohol/week: 0.0 standard drinks     Current Outpatient Medications:    ARIPiprazole (ABILIFY) 20 MG tablet, Take 20 mg by mouth daily., Disp: , Rfl:    atorvastatin (LIPITOR) 10 MG tablet, Take 1 tablet (10 mg total) by mouth daily., Disp: 30  tablet, Rfl: 10   carbamazepine (EQUETRO) 200 MG CP12 12 hr capsule, Take 1 capsule (200 mg total) by mouth 2 (two) times daily., Disp: 180 each, Rfl: 3   carbamide peroxide (DEBROX) 6.5 % OTIC solution, PLACE 5 DROPS INTO BOTH EARS TWO TIMES A DAY, Disp: 15 mL, Rfl: 0   Cholecalciferol (VITAMIN D3) 1.25 MG (50000 UT) CAPS, Take 1 capsule by mouth every 14 (fourteen) days., Disp: , Rfl:    clonazePAM (KLONOPIN) 1 MG tablet, Take 1 mg by mouth daily., Disp: , Rfl:    Cyanocobalamin 1000 MCG/15ML LIQD, Take 5 mLs (333.3333 mcg total) by mouth daily., Disp: 450 mL, Rfl: 5   DENTA 5000 PLUS 1.1 %  CREA dental cream, Take by mouth., Disp: , Rfl:    Elastic Bandages & Supports (MEDICAL COMPRESSION SOCKS) MISC, 2 each by Does not apply route daily. Apply in am's and remove it at bedtime, Disp: 2 each, Rfl: 1   famotidine (PEPCID) 40 MG tablet, TAKE 1 TABLET BY MOUTH ONCE DAILY, Disp: 7 tablet, Rfl: 10   feeding supplement, ENSURE COMPLETE, (ENSURE COMPLETE) LIQD, Take 237 mLs by mouth 2 (two) times daily between meals., Disp: 237 mL, Rfl: 3   fexofenadine (ALLEGRA ALLERGY) 60 MG tablet, Take 1 tablet (60 mg total) by mouth 2 (two) times daily., Disp: 90 tablet, Rfl: 3   finasteride (PROSCAR) 5 MG tablet, Take 5 mg by mouth daily., Disp: , Rfl:    FLUoxetine (PROZAC) 20 MG capsule, TAKE 1 CAPSULE BY MOUTH DAILY, Disp: 7 capsule, Rfl: 10   fluticasone (FLONASE) 50 MCG/ACT nasal spray, Place 2 sprays into both nostrils daily., Disp: 16 g, Rfl: 6   gabapentin (NEURONTIN) 600 MG tablet, Take 600 mg by mouth at bedtime., Disp: , Rfl:    ketoconazole (NIZORAL) 2 % cream, Apply 1 application topically daily. Groin rash, Disp: 60 g, Rfl: 1   lisinopril (ZESTRIL) 10 MG tablet, TAKE 1 TABLET BY MOUTH ONCE DAILY, Disp: 30 tablet, Rfl: 5   Lubricants (K-Y JELLY) GEL, USE  AS NEEDED TO BE ABLE TO MASTURBATE, Disp: 57 g, Rfl: 10   meloxicam (MOBIC) 15 MG tablet, Take 1 tablet (15 mg total) by mouth daily as needed for pain. Please don't give it daily, Disp: 30 tablet, Rfl: 5   nystatin cream (MYCOSTATIN), APPLY TOPICALLY TWO TIMES A DAY, Disp: 30 g, Rfl: 10   omega-3 acid ethyl esters (LOVAZA) 1 g capsule, TAKE 1 CAPSULE BY MOUTH 2 TIMES DAILY, Disp: 62 capsule, Rfl: 10   omeprazole (PRILOSEC) 20 MG capsule, Take daily, Disp: 30 capsule, Rfl: 10   ondansetron (ZOFRAN) 4 MG tablet, TAKE 1 TABLET BY MOUTH EVERY EIGHT HOURS AS NEEDED FOR NAUSEA / VOMITING, Disp: 30 tablet, Rfl: 11   Polyethyl Glyc-Propyl Glyc PF (SYSTANE ULTRA PF) 0.4-0.3 % SOLN, Apply 1 each to eye daily., Disp: 30 each, Rfl: 11   tamsulosin  (FLOMAX) 0.4 MG CAPS capsule, Take 1 capsule (0.4 mg total) by mouth daily., Disp: 28 capsule, Rfl: 5   Vitamins A & D (A+D PREVENT) OINT ointment, APPLY TOPICALLY AS NEEDED FOR DRY SKIN, Disp: 113 g, Rfl: 11   Zoster Vaccine Adjuvanted (SHINGRIX) injection, Inject 0.5 mLs into the muscle once for 1 dose., Disp: 0.5 mL, Rfl: 0  No Known Allergies  I personally reviewed active problem list, medication list, allergies, family history, social history, health maintenance with the patient/caregiver today.   ROS  Constitutional: Negative for fever and positive for weight change.  Respiratory: Negative for cough and shortness of breath.   Cardiovascular: Negative for chest pain or palpitations.  Gastrointestinal: Negative for abdominal pain, no bowel changes.  Musculoskeletal: negative for gait problem he has intermittent right knee effusion  Skin: Negative for rash.  Neurological: Negative for dizziness or headache.  No other specific complaints in a complete review of systems (except as listed in HPI above).   Objective  Vitals:   09/13/21 0825  BP: 130/84  Pulse: 80  Resp: 16  SpO2: 99%  Weight: 226 lb (102.5 kg)  Height: 6\' 4"  (1.93 m)    Body mass index is 27.51 kg/m.  Physical Exam  Constitutional: Patient appears well-developed and malnourished No distress.  HEENT: head atraumatic, normocephalic, pupils equal and reactive to light, neck supple, Cardiovascular: Normal rate, regular rhythm and normal heart sounds.  No murmur heard. No BLE edema. Pulmonary/Chest: Effort normal and breath sounds normal. No respiratory distress. Abdominal: Soft.  There is no tenderness or masses, soft  GU: some erythema but penis is not swollen today  Psychiatric: Patient has a normal mood and affect. behavior is normal. Judgment and thought content normal.   Recent Results (from the past 2160 hour(s))  Cologuard     Status: Abnormal   Collection Time: 07/15/21  6:00 AM  Result Value Ref  Range   COLOGUARD Positive (A) Negative    Comment:  POSITIVE TEST RESULT. A positive Cologuard result should be followed with a colonoscopy or visual examination of the colon. The normal value (reference range) for this assay is negative.  TEST DESCRIPTION: Composite algorithmic analysis of stool DNA-biomarkers with hemoglobin immunoassay.   Quantitative values of individual biomarkers are not reportable and are not associated with individual biomarker result reference ranges. Cologuard is intended for colorectal cancer screening of adults of either sex, 71 years or older, who are at average-risk for colorectal cancer (CRC). Cologuard has been approved for use by the U.[place]. FDA. The performance of Cologuard was established in a cross sectional study of average-risk adults aged 72-84. Cologuard performance in patients ages 94 to 83 years was estimated by sub-group analysis of near-age groups. Colonoscopies performed for a positive result may find as the most clinically significant lesion: colorectal cancer [4.0%], advanced adenoma  (including sessile serrated polyps greater than or equal to 1cm diameter) [20%] or non- advanced adenoma [31%]; or no colorectal neoplasia [45%]. These estimates are derived from a prospective cross-sectional screening study of 10,000 individuals at average risk for colorectal cancer who were screened with both Cologuard and colonoscopy. (Imperiale T. et al, Alison Stalling J Med 2014;370(14):1286-1297.) Cologuard may produce a false negative or false positive result (no colorectal cancer or precancerous polyp present at colonoscopy follow up). A negative Cologuard test result does not guarantee the absence of CRC or advanced adenoma (pre-cancer). The current Cologuard screening interval is every 3 years. Paramedic and [employer] developer). Cologuard performance data in a 10,000 patient pivotal study using colonoscopy as the reference method can be accessed at the  following location: www.exactlabs.com/results. Additional description of the Cologuard test process,  warnings and precautions can be found at www.cologuard.com.   POCT Urinalysis Dipstick     Status: None   Collection Time: 09/04/21  9:21 AM  Result Value Ref Range   Color, UA gold    Clarity, UA clear    Glucose, UA Negative Negative   Bilirubin, UA negative    Ketones, UA negative    Spec Grav, UA 1.020 1.010 -  1.025   Blood, UA negative    pH, UA 5.0 5.0 - 8.0   Protein, UA Negative Negative   Urobilinogen, UA 0.2 0.2 or 1.0 E.U./dL   Nitrite, UA negative    Leukocytes, UA Negative Negative   Appearance clear    Odor none     PHQ2/9: Depression screen [facility] 2/9 09/13/2021 09/04/2021 07/09/2021 06/11/2021 04/04/2021  Decreased Interest 0 1 0 0 0  Down, Depressed, Hopeless 0 0 0 0 0  PHQ - 2 Score 0 1 0 0 0  Altered sleeping 0 0 - 0 -  Tired, decreased energy 0 0 - 0 -  Change in appetite 0 0 - 0 -  Feeling bad or failure about yourself  0 0 - 0 -  Trouble concentrating 0 0 - 0 -  Moving slowly or fidgety/restless 0 0 - 0 -  Suicidal thoughts 0 0 - 0 -  PHQ-9 Score 0 1 - 0 -  Difficult doing work/chores - Not difficult at all - - -  Some recent data might be hidden    phq 9 is negative   Fall Risk: Fall Risk  09/13/2021 09/04/2021 07/09/2021 06/11/2021 04/04/2021  Falls in the past year? 0 0 0 0 0  Number falls in past yr: 0 0 0 0 0  Injury with Fall? 0 0 0 0 0  Comment - - - - -  Risk for fall due to : No Fall Risks - No Fall Risks No Fall Risks No Fall Risks  Follow up Falls prevention discussed Falls evaluation completed Falls prevention discussed Falls prevention discussed Falls prevention discussed      Functional Status Survey: Is the patient deaf or have difficulty hearing?: No Does the patient have difficulty seeing, even when wearing glasses/contacts?: No Does the patient have difficulty concentrating, remembering, or making decisions?: Yes Does the patient have  difficulty walking or climbing stairs?: No Does the patient have difficulty dressing or bathing?: No Does the patient have difficulty doing errands alone such as visiting a doctor's office or shopping?: Yes    Assessment & Plan  1. Moderate protein-calorie malnutrition ([place])  He is already eating Ensure twice daily but continue to lose weight   2. Bipolar I disorder, single manic episode, moderate (HCC)  Stable   3. Benign essential HTN  At goal   4. Need for shingles vaccine  - Zoster Vaccine Adjuvanted [facility]) injection; Inject 0.5 mLs into the muscle once for 1 dose.  Dispense: 0.5 mL; Refill: 0  5. Dyslipidemia   6. Cognitive decline   7. History of diabetes mellitus   8. Intellectual disability   9. Positive colorectal cancer screening using Cologuard test   10. Recurrent vomiting   11. BPH with obstruction/lower urinary tract symptoms   12. Primary osteoarthritis of both knees

## 2021-09-13 ENCOUNTER — Encounter: Payer: Self-pay | Admitting: Family Medicine

## 2021-09-13 ENCOUNTER — Ambulatory Visit (INDEPENDENT_AMBULATORY_CARE_PROVIDER_SITE_OTHER): Payer: Medicare Other | Admitting: Family Medicine

## 2021-09-13 ENCOUNTER — Other Ambulatory Visit: Payer: Self-pay

## 2021-09-13 VITALS — BP 130/84 | HR 80 | Resp 16 | Ht 76.0 in | Wt 226.0 lb

## 2021-09-13 DIAGNOSIS — E785 Hyperlipidemia, unspecified: Secondary | ICD-10-CM | POA: Diagnosis not present

## 2021-09-13 DIAGNOSIS — F3012 Manic episode without psychotic symptoms, moderate: Secondary | ICD-10-CM

## 2021-09-13 DIAGNOSIS — Z23 Encounter for immunization: Secondary | ICD-10-CM

## 2021-09-13 DIAGNOSIS — R4189 Other symptoms and signs involving cognitive functions and awareness: Secondary | ICD-10-CM | POA: Diagnosis not present

## 2021-09-13 DIAGNOSIS — R1110 Vomiting, unspecified: Secondary | ICD-10-CM | POA: Diagnosis not present

## 2021-09-13 DIAGNOSIS — E440 Moderate protein-calorie malnutrition: Secondary | ICD-10-CM | POA: Diagnosis not present

## 2021-09-13 DIAGNOSIS — R195 Other fecal abnormalities: Secondary | ICD-10-CM

## 2021-09-13 DIAGNOSIS — M170 Bilateral primary osteoarthritis of knee: Secondary | ICD-10-CM | POA: Diagnosis not present

## 2021-09-13 DIAGNOSIS — N401 Enlarged prostate with lower urinary tract symptoms: Secondary | ICD-10-CM | POA: Diagnosis not present

## 2021-09-13 DIAGNOSIS — F79 Unspecified intellectual disabilities: Secondary | ICD-10-CM

## 2021-09-13 DIAGNOSIS — I10 Essential (primary) hypertension: Secondary | ICD-10-CM | POA: Diagnosis not present

## 2021-09-13 DIAGNOSIS — Z8639 Personal history of other endocrine, nutritional and metabolic disease: Secondary | ICD-10-CM

## 2021-09-13 DIAGNOSIS — N138 Other obstructive and reflux uropathy: Secondary | ICD-10-CM

## 2021-09-13 MED ORDER — SHINGRIX 50 MCG/0.5ML IM SUSR: 0.5000 mL | Freq: Once | INTRAMUSCULAR | 0 refills | Status: DC

## 2021-09-16 ENCOUNTER — Telehealth: Payer: Self-pay

## 2021-09-16 NOTE — Telephone Encounter (Signed)
Copied from [place] [phone]. Topic: General - Other >> Sep 16, 2021  9:54 AM Bayard Beaver wrote: Reason for OMA:[id number] guardian called in about refferal to Gi, says was told another referral needs to be faxed over because it wasn't received, bt its not showing expired in the system. Please call back.

## 2021-10-03 ENCOUNTER — Other Ambulatory Visit: Payer: Self-pay

## 2021-10-03 ENCOUNTER — Other Ambulatory Visit: Payer: Self-pay | Admitting: Family Medicine

## 2021-10-03 ENCOUNTER — Telehealth (INDEPENDENT_AMBULATORY_CARE_PROVIDER_SITE_OTHER): Payer: Medicare Other | Admitting: Family Medicine

## 2021-10-03 ENCOUNTER — Ambulatory Visit: Payer: Self-pay | Admitting: *Deleted

## 2021-10-03 VITALS — BP 140/82 | HR 71 | Temp 98.0°F | Resp 16 | Ht 76.0 in | Wt 216.6 lb

## 2021-10-03 DIAGNOSIS — R300 Dysuria: Secondary | ICD-10-CM

## 2021-10-03 DIAGNOSIS — N401 Enlarged prostate with lower urinary tract symptoms: Secondary | ICD-10-CM

## 2021-10-03 DIAGNOSIS — E782 Mixed hyperlipidemia: Secondary | ICD-10-CM

## 2021-10-03 DIAGNOSIS — Z87898 Personal history of other specified conditions: Secondary | ICD-10-CM | POA: Diagnosis not present

## 2021-10-03 DIAGNOSIS — K219 Gastro-esophageal reflux disease without esophagitis: Secondary | ICD-10-CM

## 2021-10-03 DIAGNOSIS — Z87438 Personal history of other diseases of male genital organs: Secondary | ICD-10-CM

## 2021-10-03 DIAGNOSIS — R350 Frequency of micturition: Secondary | ICD-10-CM | POA: Diagnosis not present

## 2021-10-03 DIAGNOSIS — I10 Essential (primary) hypertension: Secondary | ICD-10-CM

## 2021-10-03 DIAGNOSIS — N138 Other obstructive and reflux uropathy: Secondary | ICD-10-CM | POA: Diagnosis not present

## 2021-10-03 DIAGNOSIS — R809 Proteinuria, unspecified: Secondary | ICD-10-CM

## 2021-10-03 DIAGNOSIS — E1129 Type 2 diabetes mellitus with other diabetic kidney complication: Secondary | ICD-10-CM

## 2021-10-03 LAB — CBC WITH DIFFERENTIAL/PLATELET
Absolute Monocytes: 509 cells/uL (ref 200–950)
Basophils Absolute: 20 cells/uL (ref 0–200)
Basophils Relative: 0.3 %
Eosinophils Absolute: 7 cells/uL — ABNORMAL LOW (ref 15–500)
Eosinophils Relative: 0.1 %
HCT: 43.3 % (ref 38.5–50.0)
Hemoglobin: 15.3 g/dL (ref 13.2–17.1)
Lymphs Abs: 771 cells/uL — ABNORMAL LOW (ref 850–3900)
MCH: 33.2 pg — ABNORMAL HIGH (ref 27.0–33.0)
MCHC: 35.3 g/dL (ref 32.0–36.0)
MCV: 93.9 fL (ref 80.0–100.0)
MPV: 11.1 fL (ref 7.5–12.5)
Monocytes Relative: 7.6 %
Neutro Abs: 5394 cells/uL (ref 1500–7800)
Neutrophils Relative %: 80.5 %
Platelets: 117 10*3/uL — ABNORMAL LOW (ref 140–400)
RBC: 4.61 10*6/uL (ref 4.20–5.80)
RDW: 12.6 % (ref 11.0–15.0)
Total Lymphocyte: 11.5 %
WBC: 6.7 10*3/uL (ref 3.8–10.8)

## 2021-10-03 LAB — POCT URINALYSIS DIPSTICK
Bilirubin, UA: NEGATIVE
Glucose, UA: NEGATIVE
Ketones, UA: NEGATIVE
Leukocytes, UA: NEGATIVE
Nitrite, UA: NEGATIVE
Protein, UA: NEGATIVE
Spec Grav, UA: 1.02 (ref 1.010–1.025)
Urobilinogen, UA: 0.2 E.U./dL
pH, UA: 6 (ref 5.0–8.0)

## 2021-10-03 LAB — COMPLETE METABOLIC PANEL WITH GFR
AG Ratio: 2.2 (calc) (ref 1.0–2.5)
ALT: 18 U/L (ref 9–46)
AST: 16 U/L (ref 10–35)
Albumin: 4.7 g/dL (ref 3.6–5.1)
Alkaline phosphatase (APISO): 77 U/L (ref 35–144)
BUN: 13 mg/dL (ref 7–25)
CO2: 27 mmol/L (ref 20–32)
Calcium: 9.4 mg/dL (ref 8.6–10.3)
Chloride: 100 mmol/L (ref 98–110)
Creat: 0.77 mg/dL (ref 0.70–1.35)
Globulin: 2.1 g/dL (calc) (ref 1.9–3.7)
Glucose, Bld: 119 mg/dL — ABNORMAL HIGH (ref 65–99)
Potassium: 4.1 mmol/L (ref 3.5–5.3)
Sodium: 136 mmol/L (ref 135–146)
Total Bilirubin: 0.7 mg/dL (ref 0.2–1.2)
Total Protein: 6.8 g/dL (ref 6.1–8.1)
eGFR: 102 mL/min/{1.73_m2} (ref >=60)

## 2021-10-03 MED ORDER — TAMSULOSIN HCL 0.4 MG PO CAPS: 0.4000 mg | ORAL_CAPSULE | Freq: Every day | ORAL | 12 refills | Status: DC

## 2021-10-03 MED ORDER — DOXYCYCLINE HYCLATE 100 MG PO TABS: 100.0000 mg | ORAL_TABLET | Freq: Two times a day (BID) | ORAL | 0 refills | Status: DC

## 2021-10-03 NOTE — Progress Notes (Signed)
Name: Wayne Jones   MRN: [id number]    DOB: 1960-10-18   Date:10/03/2021       Progress Note  Subjective  Chief Complaint  Chief Complaint  Patient presents with   Dysuria    Pt states has been going on for a while and no other sx associated besides pain while urinating. Pt guardian states pt has been crying over the intolerant pain.    HPI  Dysuria: Wayne Jones has a history of BPH and also a history of urinary retention. He sees Dr. Stan Head at [facility]. On her last note it was recommended to continue Finasteride and also Flomax. Patient has been  more agitated than usual over the past couple of weeks, complaining of dysuria, lowering his pants. Staff is not sure if masturbating as usual due to discomfort. He told me today not having bowel movement but group home staff is not sure. Looking through his medication list we noticed flomax not on the papers from group home. We contacted pharmacy and the rx has expired, we will send a refill today. We will also check an urine culture, advised to monitor his bowel movements and to contact urologist for follow up  Patient Active Problem List   Diagnosis Date Noted   Osteoarthritis of knee 03/26/2020   Varicose veins, lower extremity, with inflammation, ulcerated, unspecified laterality ([place]) 05/26/2017   Bilateral lower extremity edema 04/14/2017   Thrombocytopenia ([place]) 07/20/2016   Intellectual disability 03/17/2016   Polypharmacy 01/10/2016   Benign prostatic hypertrophy without urinary obstruction 01/09/2015   Chronic kidney disease (CKD), stage I 01/09/2015   Cognitive decline 01/09/2015   History of diabetes mellitus, type II 01/09/2015   Acid reflux 01/09/2015   Hearing loss 01/09/2015   HLD (hyperlipidemia) 01/09/2015   Morbid obesity, unspecified obesity type ([place]) 01/09/2015   Obstructive sleep apnea 01/09/2015   Other specified causes of urethral stricture 01/09/2015   Hernia of anterior abdominal wall 01/09/2015   Incomplete  bladder emptying 07/09/2012   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   Benign essential HTN 02/04/2007   Bipolar I disorder, single manic episode, moderate ([place]) 02/04/2007    Past Surgical History:  Procedure Laterality Date   LAPAROSCOPIC CHOLECYSTECTOMY     LAPAROSCOPY  12/03/2010   VENTRAL HERNIA REPAIR      Family History  Family history unknown: Yes    Social History   Tobacco Use   Smoking status: Never   Smokeless tobacco: Never  Substance Use Topics   Alcohol use: No    Alcohol/week: 0.0 standard drinks     Current Outpatient Medications:    ARIPiprazole (ABILIFY) 20 MG tablet, Take 20 mg by mouth daily., Disp: , Rfl:    atorvastatin (LIPITOR) 10 MG tablet, Take 1 tablet (10 mg total) by mouth daily., Disp: 30 tablet, Rfl: 10   carbamazepine (EQUETRO) 200 MG CP12 12 hr capsule, Take 1 capsule (200 mg total) by mouth 2 (two) times daily., Disp: 180 each, Rfl: 3   carbamide peroxide (DEBROX) 6.5 % OTIC solution, PLACE 5 DROPS INTO BOTH EARS TWO TIMES A DAY, Disp: 15 mL, Rfl: 0   Cholecalciferol (VITAMIN D3) 1.25 MG (50000 UT) CAPS, Take 1 capsule by mouth every 14 (fourteen) days., Disp: , Rfl:    clonazePAM (KLONOPIN) 1 MG tablet, Take 1 mg by mouth daily., Disp: , Rfl:    Cyanocobalamin 1000 MCG/15ML LIQD, Take 5 mLs (333.3333 mcg total) by mouth daily., Disp: 450 mL, Rfl: 5   DENTA 5000 PLUS 1.1 %  CREA dental cream, Take by mouth., Disp: , Rfl:    Elastic Bandages & Supports (MEDICAL COMPRESSION SOCKS) MISC, 2 each by Does not apply route daily. Apply in am's and remove it at bedtime, Disp: 2 each, Rfl: 1   famotidine (PEPCID) 40 MG tablet, TAKE 1 TABLET BY MOUTH ONCE DAILY, Disp: 7 tablet, Rfl: 10   feeding supplement, ENSURE COMPLETE, (ENSURE COMPLETE) LIQD, Take 237 mLs by mouth 2 (two) times daily between meals., Disp: 237 mL, Rfl: 3   fexofenadine (ALLEGRA ALLERGY) 60 MG tablet, Take 1 tablet (60 mg total) by mouth 2 (two) times daily., Disp: 90 tablet, Rfl: 3    finasteride (PROSCAR) 5 MG tablet, Take 5 mg by mouth daily., Disp: , Rfl:    FLUoxetine (PROZAC) 20 MG capsule, TAKE 1 CAPSULE BY MOUTH DAILY, Disp: 7 capsule, Rfl: 10   fluticasone (FLONASE) 50 MCG/ACT nasal spray, Place 2 sprays into both nostrils daily., Disp: 16 g, Rfl: 6   gabapentin (NEURONTIN) 600 MG tablet, Take 600 mg by mouth at bedtime., Disp: , Rfl:    ketoconazole (NIZORAL) 2 % cream, Apply 1 application topically daily. Groin rash, Disp: 60 g, Rfl: 1   lisinopril (ZESTRIL) 10 MG tablet, TAKE 1 TABLET BY MOUTH ONCE DAILY, Disp: 30 tablet, Rfl: 5   Lubricants (K-Y JELLY) GEL, USE  AS NEEDED TO BE ABLE TO MASTURBATE, Disp: 57 g, Rfl: 10   meloxicam (MOBIC) 15 MG tablet, Take 1 tablet (15 mg total) by mouth daily as needed for pain. Please don't give it daily, Disp: 30 tablet, Rfl: 5   nystatin cream (MYCOSTATIN), APPLY TOPICALLY TWO TIMES A DAY, Disp: 30 g, Rfl: 10   omega-3 acid ethyl esters (LOVAZA) 1 g capsule, TAKE 1 CAPSULE BY MOUTH 2 TIMES DAILY, Disp: 62 capsule, Rfl: 10   omeprazole (PRILOSEC) 20 MG capsule, Take daily, Disp: 30 capsule, Rfl: 10   ondansetron (ZOFRAN) 4 MG tablet, TAKE 1 TABLET BY MOUTH EVERY EIGHT HOURS AS NEEDED FOR NAUSEA / VOMITING, Disp: 30 tablet, Rfl: 11   Polyethyl Glyc-Propyl Glyc PF (SYSTANE ULTRA PF) 0.4-0.3 % SOLN, Apply 1 each to eye daily., Disp: 30 each, Rfl: 11   tamsulosin (FLOMAX) 0.4 MG CAPS capsule, Take 1 capsule (0.4 mg total) by mouth daily., Disp: 28 capsule, Rfl: 5   Vitamins A & D (A+D PREVENT) OINT ointment, APPLY TOPICALLY AS NEEDED FOR DRY SKIN, Disp: 113 g, Rfl: 11  No Known Allergies  I personally reviewed active problem list, medication list, allergies, family history, social history, health maintenance with the patient/caregiver today.   ROS  Ten systems reviewed and is negative except as mentioned in HPI   Objective  Vitals:   10/03/21 1041  BP: 140/82  Pulse: 71  Resp: 16  Temp: 98 F (36.7 C)  TempSrc: Oral   SpO2: 97%  Weight: 216 lb 9.6 oz (98.2 kg)  Height: 6\' 4"  (1.93 m)    Body mass index is 26.37 kg/m.  Physical Exam  Constitutional: Patient appears well-developed and well-nourished. No distress.  HEENT: head atraumatic, normocephalic, neck supple Cardiovascular: Normal rate, regular rhythm and normal heart sounds.  No murmur heard. No BLE edema. Pulmonary/Chest: Effort normal and breath sounds normal. No respiratory distress. Abdominal: Soft.  There is some supra pubic discomfort, scrotum is normal, some hypertrophy of foreskin on ventral aspect, no hernias  Psychiatric: Patient has a normal mood and affect. behavior is normal. Cooperative   Recent Results (from the past 2160 hour(s))  Cologuard     Status: Abnormal   Collection Time: 07/15/21  6:00 AM  Result Value Ref Range   COLOGUARD Positive (A) Negative    Comment:  POSITIVE TEST RESULT. A positive Cologuard result should be followed with a colonoscopy or visual examination of the colon. The normal value (reference range) for this assay is negative.  TEST DESCRIPTION: Composite algorithmic analysis of stool DNA-biomarkers with hemoglobin immunoassay.   Quantitative values of individual biomarkers are not reportable and are not associated with individual biomarker result reference ranges. Cologuard is intended for colorectal cancer screening of adults of either sex, 87 years or older, who are at average-risk for colorectal cancer (CRC). Cologuard has been approved for use by the U.S. FDA. The performance of Cologuard was established in a cross sectional study of average-risk adults aged 27-84. Cologuard performance in patients ages 27 to 31 years was estimated by sub-group analysis of near-age groups. Colonoscopies performed for a positive result may find as the most clinically significant lesion: colorectal cancer [4.0%], advanced adenoma  (including sessile serrated polyps greater than or equal to 1cm diameter) [20%] or non-  advanced adenoma [31%]; or no colorectal neoplasia [45%]. These estimates are derived from a prospective cross-sectional screening study of 10,000 individuals at average risk for colorectal cancer who were screened with both Cologuard and colonoscopy. (Imperiale T. et al, Alison Stalling J Med 2014;370(14):1286-1297.) Cologuard may produce a false negative or false positive result (no colorectal cancer or precancerous polyp present at colonoscopy follow up). A negative Cologuard test result does not guarantee the absence of CRC or advanced adenoma (pre-cancer). The current Cologuard screening interval is every 3 years. Paramedic and [employer] developer). Cologuard performance data in a 10,000 patient pivotal study using colonoscopy as the reference method can be accessed at the following location: www.exactlabs.com/results. Additional description of the Cologuard test process,  warnings and precautions can be found at www.cologuard.com.   POCT Urinalysis Dipstick     Status: None   Collection Time: 09/04/21  9:21 AM  Result Value Ref Range   Color, UA gold    Clarity, UA clear    Glucose, UA Negative Negative   Bilirubin, UA negative    Ketones, UA negative    Spec Grav, UA 1.020 1.010 - 1.025   Blood, UA negative    pH, UA 5.0 5.0 - 8.0   Protein, UA Negative Negative   Urobilinogen, UA 0.2 0.2 or 1.0 E.U./dL   Nitrite, UA negative    Leukocytes, UA Negative Negative   Appearance clear    Odor none     PHQ2/9: Depression screen [facility] 2/9 10/03/2021 09/13/2021 09/04/2021 07/09/2021 06/11/2021  Decreased Interest 0 0 1 0 0  Down, Depressed, Hopeless 0 0 0 0 0  PHQ - 2 Score 0 0 1 0 0  Altered sleeping 0 0 0 - 0  Tired, decreased energy 0 0 0 - 0  Change in appetite 0 0 0 - 0  Feeling bad or failure about yourself  0 0 0 - 0  Trouble concentrating 0 0 0 - 0  Moving slowly or fidgety/restless 0 0 0 - 0  Suicidal thoughts 0 0 0 - 0  PHQ-9 Score 0 0 1 - 0  Difficult doing  work/chores Not difficult at all - Not difficult at all - -  Some recent data might be hidden    phq 9 is negative   Fall Risk: Fall Risk  10/03/2021 09/13/2021 09/04/2021 07/09/2021 06/11/2021  Falls in the past year? 0 0 0 0 0  Number falls in past yr: 0 0 0 0 0  Injury with Fall? 0 0 0 0 0  Comment - - - - -  Risk for fall due to : No Fall Risks No Fall Risks - No Fall Risks No Fall Risks  Follow up Falls prevention discussed Falls prevention discussed Falls evaluation completed Falls prevention discussed Falls prevention discussed      Functional Status Survey: Is the patient deaf or have difficulty hearing?: No Does the patient have difficulty seeing, even when wearing glasses/contacts?: No Does the patient have difficulty concentrating, remembering, or making decisions?: Yes Does the patient have difficulty walking or climbing stairs?: No Does the patient have difficulty dressing or bathing?: No Does the patient have difficulty doing errands alone such as visiting a doctor's office or shopping?: Yes    Assessment & Plan   1. Dysuria  - POCT Urinalysis Dipstick - doxycycline (VIBRA-TABS) 100 MG tablet; Take 1 tablet (100 mg total) by mouth 2 (two) times daily.  Dispense: 14 tablet; Refill: 0 - CULTURE, URINE COMPREHENSIVE - CBC with Differential/Platelet - COMPLETE METABOLIC PANEL WITH GFR  2. History of prostatitis  - doxycycline (VIBRA-TABS) 100 MG tablet; Take 1 tablet (100 mg total) by mouth 2 (two) times daily.  Dispense: 14 tablet; Refill: 0  3. Urinary frequency  - tamsulosin (FLOMAX) 0.4 MG CAPS capsule; Take 1 capsule (0.4 mg total) by mouth daily.  Dispense: 30 capsule; Refill: 12  4. BPH with obstruction/lower urinary tract symptoms  - tamsulosin (FLOMAX) 0.4 MG CAPS capsule; Take 1 capsule (0.4 mg total) by mouth daily.  Dispense: 30 capsule; Refill: 12  5. History of urinary retention

## 2021-10-03 NOTE — Telephone Encounter (Signed)
Reason for Disposition ? [1] SEVERE pain with urination (e.g., excruciating) AND [2] not improved after 2 hours of pain medicine (e.g., acetaminophen or ibuprofen) ? ?Answer Assessment - Initial Assessment Questions ?1. SYMPTOM: "What's the main symptom you're concerned about?" (e.g., frequency, incontinence) ?    Pain in genitals, pain with urination, lack of appetite and nausea, aggitated ?2. ONSET: "When did the  symptoms  start?" ?    Daily- since last visit with provider ?3. PAIN: "Is there any pain?" If Yes, ask: "How bad is it?" (Scale: 1-10; mild, moderate, severe) ?    Yes- severe ?4. CAUSE: "What do you think is causing the symptoms?" ?    Possible UTI- not sure if UTI ?5. OTHER SYMPTOMS: "Do you have any other symptoms?" (e.g., fever, flank pain, blood in urine, pain with urination) ?    no ?6. PREGNANCY: "Is there any chance you are pregnant?" "When was your last menstrual period?" ? ?Protocols used: Urinary Symptoms-A-AH, Urination Pain - Male-A-AH ? ?

## 2021-10-03 NOTE — Telephone Encounter (Signed)
?  Chief Complaint: pain with urination ?Symptoms: pain with urination, feels not able to urinate- but care staff report patient urinated a good amount today, abdominal pain, lack of appetite  ?Frequency: ongoing- since last visit ?Pertinent Negatives: Patient denies fever, blood in urine ?Disposition: [] ED /[] Urgent Care (no appt availability in office) / [x] Appointment(In office/virtual)/ []  [facility]/ [] Home Care/ [] Refused Recommended Disposition /[] [facility]/ []  Follow-up with PCP ?Additional Notes: Call to office- video visit coordinated with help of office staff  ?

## 2021-10-05 LAB — CULTURE, URINE COMPREHENSIVE
MICRO NUMBER:: 13082940
SPECIMEN QUALITY:: ADEQUATE

## 2021-10-10 ENCOUNTER — Inpatient Hospital Stay: Payer: Medicare Other | Attending: Oncology

## 2021-10-10 ENCOUNTER — Other Ambulatory Visit: Payer: Self-pay

## 2021-10-10 ENCOUNTER — Encounter: Payer: Self-pay | Admitting: Oncology

## 2021-10-10 ENCOUNTER — Inpatient Hospital Stay (HOSPITAL_BASED_OUTPATIENT_CLINIC_OR_DEPARTMENT_OTHER): Payer: Medicare Other | Admitting: Oncology

## 2021-10-10 VITALS — BP 119/85 | HR 60 | Temp 98.3°F | Wt 220.0 lb

## 2021-10-10 DIAGNOSIS — R634 Abnormal weight loss: Secondary | ICD-10-CM | POA: Insufficient documentation

## 2021-10-10 DIAGNOSIS — R630 Anorexia: Secondary | ICD-10-CM | POA: Diagnosis not present

## 2021-10-10 DIAGNOSIS — D696 Thrombocytopenia, unspecified: Secondary | ICD-10-CM | POA: Diagnosis not present

## 2021-10-10 DIAGNOSIS — R109 Unspecified abdominal pain: Secondary | ICD-10-CM | POA: Diagnosis not present

## 2021-10-10 LAB — CBC WITH DIFFERENTIAL/PLATELET
Abs Immature Granulocytes: 0.02 10*3/uL (ref 0.00–0.07)
Basophils Absolute: 0 10*3/uL (ref 0.0–0.1)
Basophils Relative: 0 %
Eosinophils Absolute: 0.1 10*3/uL (ref 0.0–0.5)
Eosinophils Relative: 1 %
HCT: 39 % (ref 39.0–52.0)
Hemoglobin: 14.2 g/dL (ref 13.0–17.0)
Immature Granulocytes: 0 %
Lymphocytes Relative: 18 %
Lymphs Abs: 0.9 10*3/uL (ref 0.7–4.0)
MCH: 33.6 pg (ref 26.0–34.0)
MCHC: 36.4 g/dL — ABNORMAL HIGH (ref 30.0–36.0)
MCV: 92.2 fL (ref 80.0–100.0)
Monocytes Absolute: 0.4 10*3/uL (ref 0.1–1.0)
Monocytes Relative: 8 %
Neutro Abs: 3.5 10*3/uL (ref 1.7–7.7)
Neutrophils Relative %: 73 %
Platelets: 110 10*3/uL — ABNORMAL LOW (ref 150–400)
RBC: 4.23 MIL/uL (ref 4.22–5.81)
RDW: 12.4 % (ref 11.5–15.5)
WBC: 4.8 10*3/uL (ref 4.0–10.5)
nRBC: 0 % (ref 0.0–0.2)

## 2021-10-10 LAB — COMPREHENSIVE METABOLIC PANEL
ALT: 17 U/L (ref 0–44)
AST: 21 U/L (ref 15–41)
Albumin: 3.8 g/dL (ref 3.5–5.0)
Alkaline Phosphatase: 64 U/L (ref 38–126)
Anion gap: 6 (ref 5–15)
BUN: 12 mg/dL (ref 6–20)
CO2: 28 mmol/L (ref 22–32)
Calcium: 8.6 mg/dL — ABNORMAL LOW (ref 8.9–10.3)
Chloride: 100 mmol/L (ref 98–111)
Creatinine, Ser: 0.62 mg/dL (ref 0.61–1.24)
GFR, Estimated: >60 mL/min (ref >60)
Glucose, Bld: 100 mg/dL — ABNORMAL HIGH (ref 70–99)
Potassium: 4.2 mmol/L (ref 3.5–5.1)
Sodium: 134 mmol/L — ABNORMAL LOW (ref 135–145)
Total Bilirubin: 0.6 mg/dL (ref 0.3–1.2)
Total Protein: 6.3 g/dL — ABNORMAL LOW (ref 6.5–8.1)

## 2021-10-10 NOTE — Progress Notes (Signed)
Hematology/Oncology Progress note [phone] [phone]      Patient Care Team: Steele Sizer, MD as PCP - General (Family Medicine) Alvy Bimler, MD as Referring Physician (Psychiatry) Gardiner Barefoot, DPM as Consulting Physician (Podiatry) Delfin Edis, MD as Referring Physician (Urology) Earlie Server, MD as Consulting Physician (Hematology) Kate Sable, MD as Consulting Physician (Cardiology)  REFERRING PROVIDER: Steele Sizer, MD  CHIEF COMPLAINTS/REASON FOR VISIT:  thrombocytopenia, weight loss  HISTORY OF PRESENTING ILLNESS:  Wayne Jones is a 61 y.o. male who was seen in consultation at the request of Steele Sizer, MD for evaluation of thrombocytopenia   Reviewed patient's labs done previously.  12/04/2020 labs showed decreased platelet counts at 99,000.  Normal wbc  hemoglobin  Reviewed patient's previous labs. Thrombocytopenia is chronic chronic onset , since at least 2014 with baseline around 100,000. Patient has a history of mental disorders and lives in a group home.  He is a poor historian.  Denies any new complaints.  Denies any easy bleeding or bruising.  He was accompanied by staff member from the group home.  Staff denies any recent change of Abilify and carbamazepine.  INTERVAL HISTORY [name] is a 61 y.o. male who has above history reviewed by me today presents for follow up visit for management of thrombocytopenia and weight loss Patient was accompanied by group home staff. Patient continues to lose weight.  16 pounds since last visit. Patient is a poor historian. He had urinary incontinence incident in the clinic.   Review of Systems  Unable to perform ROS: Other (Mental disorder)  Constitutional:  Negative for appetite change, fatigue and unexpected weight change.  Respiratory:  Negative for cough and shortness of breath.   Cardiovascular:  Negative for chest pain.  Genitourinary:  Negative for  dysuria.   Hematological:  Negative for adenopathy. Does not bruise/bleed easily.   MEDICAL HISTORY:  Past Medical History:  Diagnosis Date   Depression    Diabetes mellitus without complication ([place])    Hyperlipidemia    Hypertension    Mental disorder     SURGICAL HISTORY: Past Surgical History:  Procedure Laterality Date   LAPAROSCOPIC CHOLECYSTECTOMY     LAPAROSCOPY  12/03/2010   VENTRAL HERNIA REPAIR      SOCIAL HISTORY: Social History   Socioeconomic History   Marital status: Single    Spouse name: Not on file   Number of children: Not on file   Years of education: Not on file   Highest education level: Not on file  Occupational History   Occupation: disabled    Comment: Patient in care home  Tobacco Use   Smoking status: Never   Smokeless tobacco: Never  Vaping Use   Vaping Use: Never used  Substance and Sexual Activity   Alcohol use: No    Alcohol/week: 0.0 standard drinks   Drug use: No   Sexual activity: Never  Other Topics Concern   Not on file  Social History Narrative   [name] calls him twice weekly. Pt resides at [facility] group home at [employer]    Social Determinants of Health   Financial Resource Strain: Low Risk    Difficulty of Paying Living Expenses: Not hard at all  Food Insecurity: No Food Insecurity   Worried About Charity fundraiser in the Last Year: Never true   Arboriculturist in the Last Year: Never true  Transportation Needs: No Transportation Needs   Lack of Transportation (Medical):  No   Lack of Transportation (Non-Medical): No  Physical Activity: Insufficiently Active   Days of Exercise per Week: 3 days   Minutes of Exercise per Session: 30 min  Stress: No Stress Concern Present   Feeling of Stress : Not at all  Social Connections: Socially Isolated   Frequency of Communication with Friends and Family: Three times a week   Frequency of Social Gatherings with Friends and Family: Once a week   Attends Religious  Services: Never   Marine scientist or Organizations: No   Attends Music therapist: Never   Marital Status: Never married  Human resources officer Violence: Not At Risk   Fear of Current or Ex-Partner: No   Emotionally Abused: No   Physically Abused: No   Sexually Abused: No    FAMILY HISTORY: Family History  Family history unknown: Yes    ALLERGIES:  has No Known Allergies.  MEDICATIONS:  Current Outpatient Medications  Medication Sig Dispense Refill   ARIPiprazole (ABILIFY) 20 MG tablet Take 20 mg by mouth daily.     atorvastatin (LIPITOR) 10 MG tablet TAKE 1 TABLET BY MOUTH ONCE DAILY 7 tablet 11   carbamazepine (EQUETRO) 200 MG CP12 12 hr capsule Take 1 capsule (200 mg total) by mouth 2 (two) times daily. 180 each 3   carbamide peroxide (DEBROX) 6.5 % OTIC solution PLACE 5 DROPS INTO BOTH EARS TWO TIMES A DAY 15 mL 0   Cholecalciferol (VITAMIN D3) 1.25 MG (50000 UT) CAPS Take 1 capsule by mouth every 14 (fourteen) days.     clonazePAM (KLONOPIN) 1 MG tablet Take 1 mg by mouth daily.     Cyanocobalamin 1000 MCG/15ML LIQD Take 5 mLs (333.3333 mcg total) by mouth daily. 450 mL 5   DENTA 5000 PLUS 1.1 % CREA dental cream Take by mouth.     doxycycline (VIBRA-TABS) 100 MG tablet Take 1 tablet (100 mg total) by mouth 2 (two) times daily. 14 tablet 0   Elastic Bandages & Supports (MEDICAL COMPRESSION SOCKS) MISC 2 each by Does not apply route daily. Apply in am's and remove it at bedtime 2 each 1   famotidine (PEPCID) 40 MG tablet TAKE 1 TABLET BY MOUTH ONCE DAILY 7 tablet 10   feeding supplement, ENSURE COMPLETE, (ENSURE COMPLETE) LIQD Take 237 mLs by mouth 2 (two) times daily between meals. 237 mL 3   fexofenadine (ALLEGRA ALLERGY) 60 MG tablet Take 1 tablet (60 mg total) by mouth 2 (two) times daily. 90 tablet 3   finasteride (PROSCAR) 5 MG tablet Take 5 mg by mouth daily.     FLUoxetine (PROZAC) 20 MG capsule TAKE 1 CAPSULE BY MOUTH DAILY 7 capsule 10   fluticasone  (FLONASE) 50 MCG/ACT nasal spray Place 2 sprays into both nostrils daily. 16 g 6   gabapentin (NEURONTIN) 600 MG tablet Take 600 mg by mouth at bedtime.     ketoconazole (NIZORAL) 2 % cream Apply 1 application topically daily. Groin rash 60 g 1   lisinopril (ZESTRIL) 10 MG tablet TAKE 1 TABLET BY MOUTH ONCE DAILY 7 tablet 11   Lubricants (K-Y JELLY) GEL USE  AS NEEDED TO BE ABLE TO MASTURBATE 57 g 10   meloxicam (MOBIC) 15 MG tablet Take 1 tablet (15 mg total) by mouth daily as needed for pain. Please don't give it daily 30 tablet 5   nystatin cream (MYCOSTATIN) APPLY TOPICALLY TWO TIMES A DAY 30 g 10   omega-3 acid ethyl esters (LOVAZA) 1 g  capsule TAKE 1 CAPSULE BY MOUTH 2 TIMES DAILY 14 capsule 11   omeprazole (PRILOSEC) 20 MG capsule TAKE 1 CAPSULE BY MOUTH ONCE DAILY *DO NOT CRUSH OR CHEW* 30 capsule 11   ondansetron (ZOFRAN) 4 MG tablet TAKE 1 TABLET BY MOUTH EVERY EIGHT HOURS AS NEEDED FOR NAUSEA / VOMITING 30 tablet 11   Polyethyl Glyc-Propyl Glyc PF (SYSTANE ULTRA PF) 0.4-0.3 % SOLN Apply 1 each to eye daily. 30 each 11   tamsulosin (FLOMAX) 0.4 MG CAPS capsule Take 1 capsule (0.4 mg total) by mouth daily. 30 capsule 12   Vitamin D, Ergocalciferol, (DRISDOL) 1.25 MG (50000 UNIT) CAPS capsule Take 50,000 Units by mouth every 14 (fourteen) days.     Vitamins A & D (A+D PREVENT) OINT ointment APPLY TOPICALLY AS NEEDED FOR DRY SKIN 113 g 11   No current facility-administered medications for this visit.     PHYSICAL EXAMINATION: ECOG PERFORMANCE STATUS: 1 - Symptomatic but completely ambulatory Vitals:   10/10/21 1035  BP: 119/85  Pulse: 60  Temp: 98.3 F (36.8 C)   Filed Weights   10/10/21 1035  Weight: 220 lb (99.8 kg)    Physical Exam Constitutional:      General: He is not in acute distress. HENT:     Head: Normocephalic and atraumatic.  Eyes:     General: No scleral icterus. Cardiovascular:     Rate and Rhythm: Normal rate and regular rhythm.     Heart sounds:  Normal heart sounds.  Pulmonary:     Effort: Pulmonary effort is normal. No respiratory distress.     Breath sounds: No wheezing.  Abdominal:     General: There is no distension.     Palpations: Abdomen is soft.  Musculoskeletal:        General: No deformity. Normal range of motion.     Cervical back: Normal range of motion and neck supple.  Skin:    General: Skin is warm and dry.     Findings: No erythema or rash.  Neurological:     Mental Status: He is alert. Mental status is at baseline.     Cranial Nerves: No cranial nerve deficit.     Coordination: Coordination normal.  Psychiatric:     Comments: Flat affect     LABORATORY DATA:  I have reviewed the data as listed Lab Results  Component Value Date   WBC 4.8 10/10/2021   HGB 14.2 10/10/2021   HCT 39.0 10/10/2021   MCV 92.2 10/10/2021   PLT 110 (L) 10/10/2021   Recent Labs    12/04/20 1153 04/04/21 1047 10/03/21 1121 10/10/21 1013  NA 136 136 136 134*  K 4.1 4.7 4.1 4.2  CL 101 100 100 100  CO2 '27 30 27 28  '$ GLUCOSE 143* 99 119* 100*  BUN '15 13 13 12  '$ CREATININE 0.85 0.67* 0.77 0.62  CALCIUM 8.6 9.0 9.4 8.6*  GFRNONAA 95  --   --  >60  GFRAA 110  --   --   --   PROT 5.8* 6.2 6.8 6.3*  ALBUMIN  --   --   --  3.8  AST '18 16 16 21  '$ ALT '16 17 18 17  '$ ALKPHOS  --   --   --  64  BILITOT 0.4 0.5 0.7 0.6    Iron/TIBC/Ferritin/ %Sat No results found for: IRON, TIBC, FERRITIN, IRONPCTSAT    RADIOGRAPHIC STUDIES: I have personally reviewed the radiological images as listed and agreed with the findings  in the report.  No results found.   ASSESSMENT & PLAN:  1. Thrombocytopenia ([place])   2. Weight loss   3. Anorexia   4. Abdominal pain, unspecified abdominal location    Thrombocytopenia was considered to be due to carbamazepine/Abilify. Labs reviewed and discussed with patient.  Platelet count is stable.  Weight loss, additional 60 pounds since last visit.  Etiology unknown.  TSH was checked and was normal.   No adenopahty, no other constitional symptoms.  Patient has never had a colonoscopy.  Abdominal discomfort I will obtain CT abdomen pelvis with contrast for further evaluation.  Orders Placed This Encounter  Procedures   CT Abdomen Pelvis W Contrast    Standing Status:   Future    Standing Expiration Date:   10/10/2022    Order Specific Question:   If indicated for the ordered procedure, I authorize the administration of contrast media per Radiology protocol    Answer:   Yes    Order Specific Question:   Preferred imaging location?    Answer:   [facility]    Order Specific Question:   Is Oral Contrast requested for this exam?    Answer:   Yes, Per Radiology protocol    All questions were answered. The patient knows to call the clinic with any problems questions or concerns.  Cc Sowles, Drue Stager, MD  Return of visit: To be determined based on CT scan.    Earlie Server, MD, PhD 10/10/2021

## 2021-10-24 ENCOUNTER — Ambulatory Visit: Payer: Medicare Other | Admitting: Podiatry

## 2021-10-24 ENCOUNTER — Ambulatory Visit
Admission: RE | Admit: 2021-10-24 | Discharge: 2021-10-24 | Disposition: A | Discharge status: Home / Self Care | Payer: Medicare Other | Source: Ambulatory Visit | Attending: Oncology | Admitting: Oncology

## 2021-10-24 ENCOUNTER — Other Ambulatory Visit: Payer: Self-pay

## 2021-10-24 DIAGNOSIS — R634 Abnormal weight loss: Secondary | ICD-10-CM | POA: Insufficient documentation

## 2021-10-24 DIAGNOSIS — R109 Unspecified abdominal pain: Secondary | ICD-10-CM | POA: Diagnosis not present

## 2021-10-24 MED ORDER — IOHEXOL 300 MG/ML  SOLN
100.0000 mL | Freq: Once | INTRAMUSCULAR | Status: AC | PRN
  Administered 2021-10-24: 100 mL via INTRAVENOUS

## 2021-10-28 ENCOUNTER — Encounter: Payer: Self-pay | Admitting: Gastroenterology

## 2021-10-28 ENCOUNTER — Other Ambulatory Visit: Payer: Self-pay

## 2021-10-28 ENCOUNTER — Ambulatory Visit (INDEPENDENT_AMBULATORY_CARE_PROVIDER_SITE_OTHER): Payer: Medicare Other | Admitting: Gastroenterology

## 2021-10-28 VITALS — BP 121/76 | HR 66 | Temp 98.1°F | Ht 76.0 in | Wt 216.2 lb

## 2021-10-28 DIAGNOSIS — R933 Abnormal findings on diagnostic imaging of other parts of digestive tract: Secondary | ICD-10-CM | POA: Diagnosis not present

## 2021-10-28 DIAGNOSIS — R634 Abnormal weight loss: Secondary | ICD-10-CM

## 2021-10-28 MED ORDER — NA SULFATE-K SULFATE-MG SULF 17.5-3.13-1.6 GM/177ML PO SOLN: 354.0000 mL | Freq: Once | ORAL | 0 refills | Status: AC

## 2021-10-28 NOTE — Progress Notes (Signed)
?  ?Cephas Darby, MD ?[address]  ?[address]  ?[place], [place] [address]  ?Main: [phone]  ?Fax: [phone] ? ? ? ?Gastroenterology Consultation ? ?Referring Provider:     Dr. Earlie Server, MD ?Primary Care Physician:  Steele Sizer, MD ?Primary Gastroenterologist:  Dr. Cephas Darby ?Reason for Consultation:     Weight loss, abdominal pain, abnormal CT scan ?      ? HPI:   ?Wayne Jones is a 61 y.o. male referred by Dr. Steele Sizer, MD  for consultation & management of weight loss and abdominal pain.  Patient is a resident of group home, poor historian, recently evaluated by Dr. Tasia Catchings for thrombocytopenia and weight loss.  Patient has thrombocytopenia which is considered secondary to carbamazepine, Abilify.  Patient underwent CT of the abdomen and pelvis on 10/24/2021 which revealed multiple short segments of bowel wall thickening with adjacent mesenteric fat stranding and trace fluid involving ileum and colon, which are nonspecific findings.  Therefore, patient is referred to GI for further evaluation.  Liver appeared normal, evidence of splenomegaly.  Patient has chronic thrombocytopenia dating back to 2013.  No evidence of chronic liver disease.  Patient is accompanied by his caregiver from group home.  No known history of constipation or diarrhea.  Was reported to have some rectal bleeding.  Apparently, patient lost about 40 pounds within last 6 months according to his caregiver.  Denies any loss of appetite or decreased p.o. intake.  Patient points towards upper abdomen when I asked him about location of the pain.  No known history of nausea or vomiting ? ?NSAIDs: None ? ?Antiplts/Anticoagulants/Anti thrombotics: None ? ?GI Procedures:  ?Upper endoscopy in 2013 ?Diagnosis:  ?GASTRIC ANTRUM COLD BIOPSIES:  ?- ANTRAL MUCOSA, NO PATHOLOGIC CHANGES.  ?- NEGATIVE FOR HELICOBACTER PYLORI ON DIFF-QUIK STAIN.  ? ?Colonoscopy in 07/2007 normal ? ?Past Medical History:  ?Diagnosis Date  ? Depression    ? Diabetes mellitus without complication ([place])   ? Hyperlipidemia   ? Hypertension   ? Mental disorder   ? ? ?Past Surgical History:  ?Procedure Laterality Date  ? LAPAROSCOPIC CHOLECYSTECTOMY    ? LAPAROSCOPY  12/03/2010  ? VENTRAL HERNIA REPAIR    ? ? ? ?Current Outpatient Medications:  ?  ARIPiprazole (ABILIFY) 20 MG tablet, Take 20 mg by mouth daily., Disp: , Rfl:  ?  atorvastatin (LIPITOR) 10 MG tablet, TAKE 1 TABLET BY MOUTH ONCE DAILY, Disp: 7 tablet, Rfl: 11 ?  carbamide peroxide (DEBROX) 6.5 % OTIC solution, PLACE 5 DROPS INTO BOTH EARS TWO TIMES A DAY, Disp: 15 mL, Rfl: 0 ?  Cholecalciferol (VITAMIN D3) 1.25 MG (50000 UT) CAPS, Take 1 capsule by mouth every 14 (fourteen) days., Disp: , Rfl:  ?  clonazePAM (KLONOPIN) 1 MG tablet, Take 1 mg by mouth daily., Disp: , Rfl:  ?  Cyanocobalamin 1000 MCG/15ML LIQD, Take 5 mLs (333.3333 mcg total) by mouth daily., Disp: 450 mL, Rfl: 5 ?  DENTA 5000 PLUS 1.1 % CREA dental cream, Take by mouth., Disp: , Rfl:  ?  Elastic Bandages & Supports (MEDICAL COMPRESSION SOCKS) MISC, 2 each by Does not apply route daily. Apply in am's and remove it at bedtime, Disp: 2 each, Rfl: 1 ?  famotidine (PEPCID) 40 MG tablet, TAKE 1 TABLET BY MOUTH ONCE DAILY, Disp: 7 tablet, Rfl: 10 ?  feeding supplement, ENSURE COMPLETE, (ENSURE COMPLETE) LIQD, Take 237 mLs by mouth 2 (two) times daily between meals., Disp: 237 mL, Rfl: 3 ?  fexofenadine (ALLEGRA ALLERGY) 60 MG tablet, Take 1 tablet (60 mg total) by mouth 2 (two) times daily., Disp: 90 tablet, Rfl: 3 ?  finasteride (PROSCAR) 5 MG tablet, Take 5 mg by mouth daily., Disp: , Rfl:  ?  FLUoxetine (PROZAC) 20 MG capsule, TAKE 1 CAPSULE BY MOUTH DAILY, Disp: 7 capsule, Rfl: 10 ?  fluticasone (FLONASE) 50 MCG/ACT nasal spray, Place 2 sprays into both nostrils daily., Disp: 16 g, Rfl: 6 ?  gabapentin (NEURONTIN) 600 MG tablet, Take 600 mg by mouth at bedtime., Disp: , Rfl:  ?  ketoconazole (NIZORAL) 2 % cream, Apply 1 application topically  daily. Groin rash, Disp: 60 g, Rfl: 1 ?  lisinopril (ZESTRIL) 10 MG tablet, TAKE 1 TABLET BY MOUTH ONCE DAILY, Disp: 7 tablet, Rfl: 11 ?  Lubricants (K-Y JELLY) GEL, USE  AS NEEDED TO BE ABLE TO MASTURBATE, Disp: 57 g, Rfl: 10 ?  meloxicam (MOBIC) 15 MG tablet, Take 1 tablet (15 mg total) by mouth daily as needed for pain. Please don't give it daily, Disp: 30 tablet, Rfl: 5 ?  nystatin cream (MYCOSTATIN), APPLY TOPICALLY TWO TIMES A DAY, Disp: 30 g, Rfl: 10 ?  omeprazole (PRILOSEC) 20 MG capsule, TAKE 1 CAPSULE BY MOUTH ONCE DAILY *DO NOT CRUSH OR CHEW*, Disp: 30 capsule, Rfl: 11 ?  ondansetron (ZOFRAN) 4 MG tablet, TAKE 1 TABLET BY MOUTH EVERY EIGHT HOURS AS NEEDED FOR NAUSEA / VOMITING, Disp: 30 tablet, Rfl: 11 ?  Polyethyl Glyc-Propyl Glyc PF (SYSTANE ULTRA PF) 0.4-0.3 % SOLN, Apply 1 each to eye daily., Disp: 30 each, Rfl: 11 ?  tamsulosin (FLOMAX) 0.4 MG CAPS capsule, Take 1 capsule (0.4 mg total) by mouth daily., Disp: 30 capsule, Rfl: 12 ?  Vitamins A & D (A+D PREVENT) OINT ointment, APPLY TOPICALLY AS NEEDED FOR DRY SKIN, Disp: 113 g, Rfl: 11 ?  Na Sulfate-K Sulfate-Mg Sulf 17.5-3.13-1.6 GM/177ML SOLN, Take 354 mLs by mouth once for 1 dose., Disp: 708 mL, Rfl: 0 ?  omega-3 acid ethyl esters (LOVAZA) 1 g capsule, TAKE 1 CAPSULE BY MOUTH 2 TIMES DAILY (Patient not taking: Reported on 10/28/2021), Disp: 14 capsule, Rfl: 11 ? ? ?Family History  ?Family history unknown: Yes  ?  ? ?Social History  ? ?Tobacco Use  ? Smoking status: Never  ? Smokeless tobacco: Never  ?Vaping Use  ? Vaping Use: Never used  ?Substance Use Topics  ? Alcohol use: No  ?  Alcohol/week: 0.0 standard drinks  ? Drug use: No  ? ? ?Allergies as of 10/28/2021  ? (No Known Allergies)  ? ? ?Review of Systems:    ?All systems reviewed and negative except where noted in HPI. ? ? Physical Exam:  ?BP 121/76 (BP Location: Left Arm, Patient Position: Sitting, Cuff Size: Normal)   Pulse 66   Temp 98.1 ?F (36.7 ?C) (Oral)   Ht '6\' 4"'$  (1.93 m)   Wt 216  lb 4 oz (98.1 kg)   BMI 26.32 kg/m?  ?No LMP for male patient. ? ?General:   Alert,  Well-developed, well-nourished, pleasant and cooperative in NAD ?Head:  Normocephalic and atraumatic. ?Eyes:  Sclera clear, no icterus.   Conjunctiva pink. ?Ears:  Normal auditory acuity. ?Nose:  No deformity, discharge, or lesions. ?Mouth:  No deformity or lesions,oropharynx pink & moist. ?Neck:  Supple; no masses or thyromegaly. ?Lungs:  Respirations even and unlabored.  Clear throughout to auscultation.   No wheezes, crackles, or rhonchi. No acute distress. ?Heart:  Regular rate and rhythm;  no murmurs, clicks, rubs, or gallops. ?Abdomen:  Normal bowel sounds. Soft, non-tender and non-distended without masses, hepatosplenomegaly or hernias noted.  No guarding or rebound tenderness.   ?Rectal: Not performed ?Msk:  Symmetrical without gross deformities. Good, equal movement & strength bilaterally. ?Pulses:  Normal pulses noted. ?Extremities:  No clubbing or edema.  No cyanosis. ?Neurologic:  Alert and oriented x2 ?Skin:  Intact without significant lesions or rashes. No jaundice. ?Psych:  Alert and cooperative.  Flat affect. ? ?Imaging Studies: ?Reviewed ? ?Assessment and Plan:  ? ?BALDWIN RACICOT is a 61 y.o. male with history of diabetes, hypertension, hyperlipidemia, mental health disorder is seen in consultation for unintentional weight loss, abdominal pain, CT revealed multiple short segment bowel wall thickening involving ileum and colon, therefore referred to GI for further evaluation.  No evidence of anemia, chronic liver disease.  Patient has splenomegaly and chronic thrombocytopenia with no evidence of cirrhosis.  Patient had Cologuard positive in 07/2021, did not undergo colonoscopy yet.  Recommend upper endoscopy and colonoscopy for further evaluation ? ? ?Follow up based on the above work-up ? ? ?Cephas Darby, MD  ?

## 2021-10-30 ENCOUNTER — Telehealth: Payer: Self-pay

## 2021-10-30 ENCOUNTER — Other Ambulatory Visit: Payer: Self-pay

## 2021-10-30 DIAGNOSIS — D696 Thrombocytopenia, unspecified: Secondary | ICD-10-CM

## 2021-10-30 NOTE — Telephone Encounter (Signed)
Message sent to [facility] via secure chat to get patient scheduled for Lab/MD in 1 year.  ?

## 2021-10-30 NOTE — Telephone Encounter (Signed)
-----   Message from Earlie Server, MD sent at 10/29/2021 11:27 PM EDT ----- ?Follow up with me in 1 year. Cbc cmp immature platelet count ? ?Sorry looks like he has already established with [facility] GI group and was seen by Dr.Vanga. thanks.  ? ?

## 2021-11-11 ENCOUNTER — Encounter: Payer: Self-pay | Admitting: Podiatry

## 2021-11-11 ENCOUNTER — Ambulatory Visit (INDEPENDENT_AMBULATORY_CARE_PROVIDER_SITE_OTHER): Payer: Medicare Other | Admitting: Podiatry

## 2021-11-11 DIAGNOSIS — B351 Tinea unguium: Secondary | ICD-10-CM

## 2021-11-11 DIAGNOSIS — M79675 Pain in left toe(s): Secondary | ICD-10-CM | POA: Diagnosis not present

## 2021-11-11 DIAGNOSIS — E119 Type 2 diabetes mellitus without complications: Secondary | ICD-10-CM

## 2021-11-11 DIAGNOSIS — M79674 Pain in right toe(s): Secondary | ICD-10-CM | POA: Diagnosis not present

## 2021-11-11 NOTE — Progress Notes (Signed)
This patient returns to my office for at risk foot care.  This patient requires this care by a professional since this patient will be at risk due to having diabetes and CKD..  This patient is unable to cut nails himself since the patient cannot reach his nails.These nails are painful walking and wearing shoes.  This patient presents for at risk foot care today.  General Appearance  Alert, conversant and in no acute stress.  Vascular  Dorsalis pedis and posterior tibial  pulses are palpable  bilaterally.  Capillary return is within normal limits  bilaterally. Temperature is within normal limits  bilaterally.  Neurologic  Senn-Weinstein monofilament wire test within normal limits  bilaterally. Muscle power within normal limits bilaterally.  Nails Thick disfigured discolored nails with subungual debris  from second  to fifth toes bilaterally. No evidence of bacterial infection or drainage bilaterally.  Orthopedic  No limitations of motion  feet .  No crepitus or effusions noted.  No bony pathology or digital deformities noted.  Skin  normotropic skin with no porokeratosis noted bilaterally.  No signs of infections or ulcers noted.     Onychomycosis  Pain in right toes  Pain in left toes  Consent was obtained for treatment procedures.   Mechanical debridement of nails 2-5  bilaterally performed with a nail nipper.  Filed with dremel without incident.    Return office visit   6 months                   Told patient to return for periodic foot care and evaluation due to potential at risk complications.   Febe Champa DPM  

## 2021-11-18 ENCOUNTER — Encounter: Payer: Self-pay | Admitting: Gastroenterology

## 2021-11-19 ENCOUNTER — Ambulatory Visit: Payer: Medicare Other | Admitting: Certified Registered"

## 2021-11-19 ENCOUNTER — Encounter
Admission: RE | Disposition: A | Discharge status: Home / Self Care | Payer: Self-pay | Source: Home / Self Care | Attending: Gastroenterology

## 2021-11-19 ENCOUNTER — Ambulatory Visit
Admission: RE | Admit: 2021-11-19 | Discharge: 2021-11-19 | Disposition: A | Discharge status: Home / Self Care | Payer: Medicare Other | Attending: Gastroenterology | Admitting: Gastroenterology

## 2021-11-19 DIAGNOSIS — R948 Abnormal results of function studies of other organs and systems: Secondary | ICD-10-CM | POA: Insufficient documentation

## 2021-11-19 DIAGNOSIS — E785 Hyperlipidemia, unspecified: Secondary | ICD-10-CM | POA: Diagnosis not present

## 2021-11-19 DIAGNOSIS — D124 Benign neoplasm of descending colon: Secondary | ICD-10-CM | POA: Diagnosis not present

## 2021-11-19 DIAGNOSIS — D125 Benign neoplasm of sigmoid colon: Secondary | ICD-10-CM | POA: Diagnosis not present

## 2021-11-19 DIAGNOSIS — D123 Benign neoplasm of transverse colon: Secondary | ICD-10-CM | POA: Diagnosis not present

## 2021-11-19 DIAGNOSIS — K635 Polyp of colon: Secondary | ICD-10-CM | POA: Diagnosis not present

## 2021-11-19 DIAGNOSIS — R634 Abnormal weight loss: Secondary | ICD-10-CM | POA: Diagnosis not present

## 2021-11-19 DIAGNOSIS — R933 Abnormal findings on diagnostic imaging of other parts of digestive tract: Secondary | ICD-10-CM | POA: Diagnosis not present

## 2021-11-19 DIAGNOSIS — Z6826 Body mass index (BMI) 26.0-26.9, adult: Secondary | ICD-10-CM | POA: Diagnosis not present

## 2021-11-19 DIAGNOSIS — E119 Type 2 diabetes mellitus without complications: Secondary | ICD-10-CM | POA: Insufficient documentation

## 2021-11-19 HISTORY — PX: COLONOSCOPY WITH PROPOFOL: SHX5780

## 2021-11-19 HISTORY — PX: ESOPHAGOGASTRODUODENOSCOPY (EGD) WITH PROPOFOL: SHX5813

## 2021-11-19 LAB — GLUCOSE, CAPILLARY: Glucose-Capillary: 122 mg/dL — ABNORMAL HIGH (ref 70–99)

## 2021-11-19 SURGERY — COLONOSCOPY WITH PROPOFOL
Anesthesia: General

## 2021-11-19 MED ORDER — PROPOFOL 10 MG/ML IV BOLUS
INTRAVENOUS | Status: DC | PRN
  Administered 2021-11-19: 50 mg via INTRAVENOUS
  Administered 2021-11-19: 60 mg via INTRAVENOUS

## 2021-11-19 MED ORDER — EPHEDRINE 5 MG/ML INJ
INTRAVENOUS | Status: AC
  Filled 2021-11-19: qty 5

## 2021-11-19 MED ORDER — EPHEDRINE SULFATE (PRESSORS) 50 MG/ML IJ SOLN
INTRAMUSCULAR | Status: DC | PRN
  Administered 2021-11-19: 10 mg via INTRAVENOUS
  Administered 2021-11-19: 5 mg via INTRAVENOUS

## 2021-11-19 MED ORDER — PROPOFOL 500 MG/50ML IV EMUL
INTRAVENOUS | Status: DC | PRN
  Administered 2021-11-19: 100 ug/kg/min via INTRAVENOUS

## 2021-11-19 MED ORDER — SODIUM CHLORIDE 0.9 % IV SOLN: INTRAVENOUS | Status: DC

## 2021-11-19 MED ORDER — LIDOCAINE HCL (CARDIAC) PF 100 MG/5ML IV SOSY
PREFILLED_SYRINGE | INTRAVENOUS | Status: DC | PRN
  Administered 2021-11-19: 40 mg via INTRAVENOUS

## 2021-11-19 NOTE — H&P (Signed)
?Wayne Darby, MD ?[address]  ?[address]  ?[place], [place] [address]  ?Main: [phone]  ?Fax: [phone] ?Pager: [phone] ? ?Primary Care Physician:  Steele Sizer, MD ?Primary Gastroenterologist:  Dr. Cephas Jones ? ?Pre-Procedure History & Physical: ?HPI:  Wayne Jones is a 61 y.o. male is here for an endoscopy and colonoscopy. ?  ?Past Medical History:  ?Diagnosis Date  ? Depression   ? Diabetes mellitus without complication ([place])   ? Hyperlipidemia   ? Hypertension   ? Mental disorder   ? ? ?Past Surgical History:  ?Procedure Laterality Date  ? LAPAROSCOPIC CHOLECYSTECTOMY    ? LAPAROSCOPY  12/03/2010  ? VENTRAL HERNIA REPAIR    ? ? ?Prior to Admission medications   ?Medication Sig Start Date End Date Taking? Authorizing Provider  ?ARIPiprazole (ABILIFY) 20 MG tablet Take 20 mg by mouth daily.    [provider]  ?atorvastatin (LIPITOR) 10 MG tablet TAKE 1 TABLET BY MOUTH ONCE DAILY 10/03/21   Ancil Boozer, Drue Stager, MD  ?carbamide peroxide (DEBROX) 6.5 % OTIC solution PLACE 5 DROPS INTO BOTH EARS TWO TIMES A DAY 10/13/18   Steele Sizer, MD  ?Cholecalciferol (VITAMIN D3) 1.25 MG (50000 UT) CAPS Take 1 capsule by mouth every 14 (fourteen) days.    [provider]  ?clonazePAM (KLONOPIN) 1 MG tablet Take 1 mg by mouth daily.    Alvy Bimler, MD  ?Cyanocobalamin 1000 MCG/15ML LIQD Take 5 mLs (333.3333 mcg total) by mouth daily. 03/26/21   Steele Sizer, MD  ?DENTA 5000 PLUS 1.1 % CREA dental cream Take by mouth. 01/27/20   [provider]  ?Elastic Bandages & Supports (MEDICAL COMPRESSION SOCKS) MISC 2 each by Does not apply route daily. Apply in am's and remove it at bedtime 09/04/21   Teodora Medici, DO  ?famotidine (PEPCID) 40 MG tablet TAKE 1 TABLET BY MOUTH ONCE DAILY 09/10/21   Steele Sizer, MD  ?feeding supplement, ENSURE COMPLETE, (ENSURE COMPLETE) LIQD Take 237 mLs by mouth 2 (two) times daily between meals. 04/02/21   Steele Sizer, MD  ?fexofenadine  [facility] ALLERGY) 60 MG tablet Take 1 tablet (60 mg total) by mouth 2 (two) times daily. 09/04/21   Teodora Medici, DO  ?finasteride (PROSCAR) 5 MG tablet Take 5 mg by mouth daily.    [provider]  ?FLUoxetine (PROZAC) 20 MG capsule TAKE 1 CAPSULE BY MOUTH DAILY 06/15/20   Steele Sizer, MD  ?fluticasone (FLONASE) 50 MCG/ACT nasal spray Place 2 sprays into both nostrils daily. 09/04/21   Teodora Medici, DO  ?gabapentin (NEURONTIN) 600 MG tablet Take 600 mg by mouth at bedtime.    [provider]  ?ketoconazole (NIZORAL) 2 % cream Apply 1 application topically daily. Groin rash 03/05/20   Steele Sizer, MD  ?lisinopril (ZESTRIL) 10 MG tablet TAKE 1 TABLET BY MOUTH ONCE DAILY 10/03/21   Steele Sizer, MD  ?Lubricants (K-Y JELLY) GEL USE  AS NEEDED TO BE ABLE TO MASTURBATE 01/29/21   Ancil Boozer, Drue Stager, MD  ?meloxicam (MOBIC) 15 MG tablet Take 1 tablet (15 mg total) by mouth daily as needed for pain. Please don't give it daily 03/06/21   Steele Sizer, MD  ?nystatin cream (MYCOSTATIN) APPLY TOPICALLY TWO TIMES A DAY 08/27/20   Steele Sizer, MD  ?omega-3 acid ethyl esters (LOVAZA) 1 g capsule TAKE 1 CAPSULE BY MOUTH 2 TIMES DAILY ?Patient not taking: Reported on 10/28/2021 10/03/21   Steele Sizer, MD  ?omeprazole (PRILOSEC) 20 MG capsule TAKE 1 CAPSULE BY  MOUTH ONCE DAILY *DO NOT CRUSH OR CHEW* 10/03/21   Steele Sizer, MD  ?ondansetron (ZOFRAN) 4 MG tablet TAKE 1 TABLET BY MOUTH EVERY EIGHT HOURS AS NEEDED FOR NAUSEA / VOMITING 05/14/20   Ancil Boozer, Drue Stager, MD  ?Polyethyl Glyc-Propyl Glyc PF (SYSTANE ULTRA PF) 0.4-0.3 % SOLN Apply 1 each to eye daily. 04/02/18   Steele Sizer, MD  ?tamsulosin (FLOMAX) 0.4 MG CAPS capsule Take 1 capsule (0.4 mg total) by mouth daily. 10/03/21   Steele Sizer, MD  ?Vitamins A & D (A+D PREVENT) OINT ointment APPLY TOPICALLY AS NEEDED FOR DRY SKIN 09/09/21   Steele Sizer, MD  ? ? ?Allergies as of 10/28/2021  ? (No Known Allergies)  ? ? ?Family History  ?Family  history unknown: Yes  ? ? ?Social History  ? ?Socioeconomic History  ? Marital status: Single  ?  Spouse name: Not on file  ? Number of children: Not on file  ? Years of education: Not on file  ? Highest education level: Not on file  ?Occupational History  ? Occupation: disabled  ?  Comment: Patient in care home  ?Tobacco Use  ? Smoking status: Never  ? Smokeless tobacco: Never  ?Vaping Use  ? Vaping Use: Never used  ?Substance and Sexual Activity  ? Alcohol use: No  ?  Alcohol/week: 0.0 standard drinks  ? Drug use: No  ? Sexual activity: Never  ?Other Topics Concern  ? Not on file  ?Social History Narrative  ? [name] calls him twice weekly. Pt resides at [facility] group home at [facility].   ? ?Social Determinants of Health  ? ?Financial Resource Strain: Low Risk   ? Difficulty of Paying Living Expenses: Not hard at all  ?Food Insecurity: No Food Insecurity  ? Worried About Charity fundraiser in the Last Year: Never true  ? Ran Out of Food in the Last Year: Never true  ?Transportation Needs: No Transportation Needs  ? Lack of Transportation (Medical): No  ? Lack of Transportation (Non-Medical): No  ?Physical Activity: Insufficiently Active  ? Days of Exercise per Week: 3 days  ? Minutes of Exercise per Session: 30 min  ?Stress: No Stress Concern Present  ? Feeling of Stress : Not at all  ?Social Connections: Socially Isolated  ? Frequency of Communication with Friends and Family: Three times a week  ? Frequency of Social Gatherings with Friends and Family: Once a week  ? Attends Religious Services: Never  ? Active Member of Clubs or Organizations: No  ? Attends Archivist Meetings: Never  ? Marital Status: Never married  ?Intimate Partner Violence: Not At Risk  ? Fear of Current or Ex-Partner: No  ? Emotionally Abused: No  ? Physically Abused: No  ? Sexually Abused: No  ? ? ?Review of Systems: ?See HPI, otherwise negative ROS ? ?Physical Exam: ?BP (!) 155/90   Pulse 65   Temp 98 ?F (36.7 ?C)  (Temporal)   Resp 20   Ht '6\' 4"'$  (1.93 m)   Wt 97.1 kg   SpO2 100%   BMI 26.05 kg/m?  ?General:   Alert,  pleasant and cooperative in NAD ?Head:  Normocephalic and atraumatic. ?Neck:  Supple; no masses or thyromegaly. ?Lungs:  Clear throughout to auscultation.    ?Heart:  Regular rate and rhythm. ?Abdomen:  Soft, nontender and nondistended. Normal bowel sounds, without guarding, and without rebound.   ?Neurologic:  Alert and  oriented x4;  grossly normal neurologically. ? ?Impression/Plan: ?Wayne Miller  C Jones is here for an endoscopy and colonoscopy to be performed for unintentional weight loss, abdominal pain, CT revealed multiple short segment bowel wall thickening involving ileum and colon ? ?Risks, benefits, limitations, and alternatives regarding  endoscopy and colonoscopy have been reviewed with the patient.  Questions have been answered.  All parties agreeable. ? ? ?Sherri Sear, MD  11/19/2021, 11:01 AM ?

## 2021-11-19 NOTE — Op Note (Signed)
[facility] ?Gastroenterology ?Patient Name: Wayne Jones ?Procedure Date: 11/19/2021 11:04 AM ?MRN: [id number] ?Account #: [id number] ?Date of Birth: 04-28-1961 ?Admit Type: Outpatient ?Age: 61 ?Room: [facility] ENDO ROOM 3 ?Gender: Male ?Note Status: Finalized ?Instrument Name: Colonoscope [id number] ?Procedure:             Colonoscopy ?Indications:           Abnormal CT of the GI tract, Weight loss ?Providers:             Lin Landsman MD, MD ?Referring MD:          Bethena Roys. Ancil Boozer, MD (Referring MD) ?Medicines:             General Anesthesia ?Complications:         No immediate complications. Estimated blood loss: None. ?Procedure:             Pre-Anesthesia Assessment: ?                       - Prior to the procedure, a History and Physical was  ?                       performed, and patient medications and allergies were  ?                       reviewed. The patient is unable to give consent  ?                       secondary to the patient being legally incompetent to  ?                       consent. The risks and benefits of the procedure and  ?                       the sedation options and risks were discussed with the  ?                       patient's guardian. All questions were answered and  ?                       informed consent was obtained. Patient identification  ?                       and proposed procedure were verified by the physician,  ?                       the nurse, the anesthesiologist, the anesthetist and  ?                       the technician in the pre-procedure area in the  ?                       procedure room in the endoscopy suite. Mental Status  ?                       Examination: alert and oriented. Airway Examination:  ?                       normal oropharyngeal airway and neck mobility.  ?  Respiratory Examination: clear to auscultation. CV  ?                       Examination: normal. Prophylactic Antibiotics: The  ?                        patient does not require prophylactic antibiotics.  ?                       Prior Anticoagulants: The patient has taken no  ?                       previous anticoagulant or antiplatelet agents. ASA  ?                       Grade Assessment: III - A patient with severe systemic  ?                       disease. After reviewing the risks and benefits, the  ?                       patient was deemed in satisfactory condition to  ?                       undergo the procedure. The anesthesia plan was to use  ?                       general anesthesia. Immediately prior to  ?                       administration of medications, the patient was  ?                       re-assessed for adequacy to receive sedatives. The  ?                       heart rate, respiratory rate, oxygen saturations,  ?                       blood pressure, adequacy of pulmonary ventilation, and  ?                       response to care were monitored throughout the  ?                       procedure. The physical status of the patient was  ?                       re-assessed after the procedure. ?                       After obtaining informed consent, the colonoscope was  ?                       passed under direct vision. Throughout the procedure,  ?                       the patient's blood pressure, pulse, and oxygen  ?  saturations were monitored continuously. The  ?                       Colonoscope was introduced through the anus and  ?                       advanced to the the terminal ileum, with  ?                       identification of the appendiceal orifice and IC  ?                       valve. The colonoscopy was performed without  ?                       difficulty. The patient tolerated the procedure well.  ?                       The quality of the bowel preparation was evaluated  ?                       using the BBPS [facility] Bowel Preparation Scale) with  ?                       scores of: Right  Colon = 3, Transverse Colon = 3 and  ?                       Left Colon = 3 (entire mucosa seen well with no  ?                       residual staining, small fragments of stool or opaque  ?                       liquid). The total BBPS score equals 9. ?Findings: ?     The perianal and digital rectal examinations were normal. Pertinent  ?     negatives include normal sphincter tone and no palpable rectal lesions. ?     The terminal ileum appeared normal. ?     Two sessile polyps were found in the transverse colon. The polyps were 4  ?     to 6 mm in size. These polyps were removed with a cold snare. Resection  ?     and retrieval were complete. To prevent bleeding after the polypectomy,  ?     one hemostatic clip was successfully placed (MR conditional). There was  ?     no bleeding at the end of the procedure. ?     Two sessile polyps were found in the sigmoid colon and descending colon.  ?     The polyps were 4 to 5 mm in size. These polyps were removed with a cold  ?     snare. Resection and retrieval were complete. Estimated blood loss: none. ?     The retroflexed view of the distal rectum and anal verge was normal and  ?     showed no anal or rectal abnormalities. ?Impression:            - The examined portion of the ileum was normal. ?                       -  Two 4 to 6 mm polyps in the transverse colon,  ?                       removed with a cold snare. Resected and retrieved.  ?                       Clip (MR conditional) was placed. ?                       - Two 4 to 5 mm polyps in the sigmoid colon and in the  ?                       descending colon, removed with a cold snare. Resected  ?                       and retrieved. ?                       - The distal rectum and anal verge are normal on  ?                       retroflexion view. ?Recommendation:        - Discharge patient to a nursing home (with escort). ?                       - Resume previous diet today. ?                       - Continue  present medications. ?                       - Await pathology results. ?                       - Repeat colonoscopy in 5 years for surveillance based  ?                       on pathology results. ?Procedure Code(s):     --- Professional --- ?                       [phone], Colonoscopy, flexible; with removal of  ?                       tumor(s), polyp(s), or other lesion(s) by snare  ?                       technique ?Diagnosis Code(s):     --- Professional --- ?                       K63.5, Polyp of colon ?                       R63.4, Abnormal weight loss ?                       R93.3, Abnormal findings on diagnostic imaging of  ?                       other parts of digestive tract ?  CPT copyright 2019 American Medical Association. All rights reserved. ?The codes documented in this report are preliminary and upon coder review may  ?be revised to meet current compliance requirements. ?Dr. Ulyess Mort ?Shaye Elling Raeanne Gathers MD, MD ?11/19/2021 11:48:36 AM ?This report has been signed electronically. ?Number of Addenda: 0 ?Note Initiated On: 11/19/2021 11:04 AM ?Scope Withdrawal Time: 0 hours 16 minutes 35 seconds  ?Total Procedure Duration: 0 hours 21 minutes 19 seconds  ?Estimated Blood Loss:  Estimated blood loss: none. ?     [facility] ?

## 2021-11-19 NOTE — Anesthesia Preprocedure Evaluation (Signed)
Anesthesia Evaluation  ?Patient identified by MRN, date of birth, ID band ?Patient awake ? ? ? ?Reviewed: ?Allergy & Precautions, H&P , NPO status , Patient's Chart, lab work & pertinent test results, reviewed documented beta blocker date and time  ? ?Airway ?Mallampati: III ? ? ?Neck ROM: full ? ? ? Dental ? ?(+) Poor Dentition ?  ?Pulmonary ?neg pulmonary ROS, sleep apnea and Continuous Positive Airway Pressure Ventilation ,  ?  ?Pulmonary exam normal ? ? ? ? ? ? ? Cardiovascular ?Exercise Tolerance: Poor ?hypertension, On Medications ?negative cardio ROS ?Normal cardiovascular exam ?Rhythm:regular Rate:Normal ? ? ?  ?Neuro/Psych ?PSYCHIATRIC DISORDERS Depression Bipolar Disorder negative neurological ROS ?   ? GI/Hepatic ?negative GI ROS, Neg liver ROS, GERD  Medicated,  ?Endo/Other  ?negative endocrine ROSdiabetes, Well Controlled, Type 2, Oral Hypoglycemic Agents ? Renal/GU ?CRFRenal diseasenegative Renal ROS  ?negative genitourinary ?  ?Musculoskeletal ? ? Abdominal ?  ?Peds ? Hematology ?negative hematology ROS ?(+)   ?Anesthesia Other Findings ?Past Medical History: ?No date: Depression ?No date: Diabetes mellitus without complication ([place]) ?No date: Hyperlipidemia ?No date: Hypertension ?No date: Mental disorder ?Past Surgical History: ?No date: LAPAROSCOPIC CHOLECYSTECTOMY ?12/03/2010: LAPAROSCOPY ?No date: VENTRAL HERNIA REPAIR ? ? Reproductive/Obstetrics ?negative OB ROS ? ?  ? ? ? ? ? ? ? ? ? ? ? ? ? ?  ?  ? ? ? ? ? ? ? ? ?Anesthesia Physical ?Anesthesia Plan ? ?ASA: 3 ? ?Anesthesia Plan: General  ? ?Post-op Pain Management:   ? ?Induction:  ? ?PONV Risk Score and Plan:  ? ?Airway Management Planned:  ? ?Additional Equipment:  ? ?Intra-op Plan:  ? ?Post-operative Plan:  ? ?Informed Consent: I have reviewed the patients History and Physical, chart, labs and discussed the procedure including the risks, benefits and alternatives for the proposed anesthesia with the patient  or authorized representative who has indicated his/her understanding and acceptance.  ? ? ? ?Dental Advisory Given ? ?Plan Discussed with: CRNA ? ?Anesthesia Plan Comments:   ? ? ? ? ? ? ?Anesthesia Quick Evaluation ? ?

## 2021-11-19 NOTE — Transfer of Care (Signed)
Immediate Anesthesia Transfer of Care Note ? ?Patient: Wayne Jones ? ?Procedure(s) Performed: COLONOSCOPY WITH PROPOFOL ?ESOPHAGOGASTRODUODENOSCOPY (EGD) WITH PROPOFOL ? ?Patient Location: PACU ? ?Anesthesia Type:General ? ?Level of Consciousness: awake and alert  ? ?Airway & Oxygen Therapy: Patient Spontanous Breathing ? ?Post-op Assessment: Report given to RN and Post -op Vital signs reviewed and stable ? ?Post vital signs: Reviewed and stable ? ?Last Vitals:  ?Vitals Value Taken Time  ?BP 110/48 11/19/21 1150  ?Temp 36.6 ?C 11/19/21 1150  ?Pulse 69 11/19/21 1150  ?Resp 26 11/19/21 1150  ?SpO2 100 % 11/19/21 1150  ?Vitals shown include unvalidated device data. ? ?Last Pain:  ?Vitals:  ? 11/19/21 1150  ?TempSrc: Temporal  ?PainSc:   ?   ? ?  ? ?Complications: No notable events documented. ?

## 2021-11-19 NOTE — Op Note (Signed)
[facility] ?Gastroenterology ?Patient Name: Wayne Jones ?Procedure Date: 11/19/2021 11:05 AM ?MRN: [id number] ?Account #: [id number] ?Date of Birth: [birth year] ?Admit Type: Outpatient ?Age: 61 ?Room: [facility] ENDO ROOM 3 ?Gender: Male ?Note Status: Finalized ?Instrument Name: Upper-Endoscope [id number] ?Procedure:             Upper GI endoscopy ?Indications:           Abnormal CT of the GI tract, Weight loss ?Providers:             Lin Landsman MD, MD ?Referring MD:          Bethena Roys. Ancil Boozer, MD (Referring MD) ?Medicines:             General Anesthesia ?Complications:         No immediate complications. Estimated blood loss: None. ?Procedure:             Pre-Anesthesia Assessment: ?                       - Prior to the procedure, a History and Physical was  ?                       performed, and patient medications and allergies were  ?                       reviewed. The patient is unable to give consent  ?                       secondary to the patient being legally incompetent to  ?                       consent. The risks and benefits of the procedure and  ?                       the sedation options and risks were discussed with the  ?                       patient's guardian. All questions were answered and  ?                       informed consent was obtained. Patient identification  ?                       and proposed procedure were verified by the physician,  ?                       the nurse, the anesthesiologist, the anesthetist and  ?                       the technician in the pre-procedure area in the  ?                       procedure room in the endoscopy suite. Mental Status  ?                       Examination: alert and oriented. Airway Examination:  ?                       normal oropharyngeal airway and neck mobility.  ?  Respiratory Examination: clear to auscultation. CV  ?                       Examination: normal. Prophylactic Antibiotics: The  ?                        patient does not require prophylactic antibiotics.  ?                       Prior Anticoagulants: The patient has taken no  ?                       previous anticoagulant or antiplatelet agents. ASA  ?                       Grade Assessment: III - A patient with severe systemic  ?                       disease. After reviewing the risks and benefits, the  ?                       patient was deemed in satisfactory condition to  ?                       undergo the procedure. The anesthesia plan was to use  ?                       general anesthesia. Immediately prior to  ?                       administration of medications, the patient was  ?                       re-assessed for adequacy to receive sedatives. The  ?                       heart rate, respiratory rate, oxygen saturations,  ?                       blood pressure, adequacy of pulmonary ventilation, and  ?                       response to care were monitored throughout the  ?                       procedure. The physical status of the patient was  ?                       re-assessed after the procedure. ?                       After obtaining informed consent, the endoscope was  ?                       passed under direct vision. Throughout the procedure,  ?                       the patient's blood pressure, pulse, and oxygen  ?  saturations were monitored continuously. The Endoscope  ?                       was introduced through the mouth, and advanced to the  ?                       second part of duodenum. The upper GI endoscopy was  ?                       accomplished without difficulty. The patient tolerated  ?                       the procedure well. ?Findings: ?     The examined duodenum was normal. Biopsies were taken with a cold  ?     forceps for histology. ?     The entire examined stomach was normal. Biopsies were taken with a cold  ?     forceps for Helicobacter pylori testing. ?      Esophagogastric landmarks were identified: the gastroesophageal junction  ?     was found at 40 cm from the incisors. ?     The gastroesophageal junction and examined esophagus were normal. ?Impression:            - Normal examined duodenum. Biopsied. ?                       - Normal stomach. Biopsied. ?                       - Esophagogastric landmarks identified. ?                       - Normal gastroesophageal junction and esophagus. ?Recommendation:        - Await pathology results. ?                       - Proceed with colonoscopy as scheduled ?                       See colonoscopy report ?Procedure Code(s):     --- Professional --- ?                       [phone], Esophagogastroduodenoscopy, flexible,  ?                       transoral; with biopsy, single or multiple ?Diagnosis Code(s):     --- Professional --- ?                       R63.4, Abnormal weight loss ?                       R93.3, Abnormal findings on diagnostic imaging of  ?                       other parts of digestive tract ?CPT copyright 2019 [employer]. All rights reserved. ?The codes documented in this report are preliminary and upon coder review may  ?be revised to meet current compliance requirements. ?Dr. Ulyess Mort ?Lin Landsman MD, MD ?11/19/2021 11:22:08 AM ?This report has been signed electronically. ?Number of Addenda:  0 ?Note Initiated On: 11/19/2021 11:05 AM ?Estimated Blood Loss:  Estimated blood loss: none. ?     [facility] ?

## 2021-11-20 ENCOUNTER — Encounter: Payer: Self-pay | Admitting: Gastroenterology

## 2021-11-20 LAB — SURGICAL PATHOLOGY

## 2021-11-22 ENCOUNTER — Encounter: Payer: Self-pay | Admitting: Gastroenterology

## 2021-12-02 NOTE — Anesthesia Postprocedure Evaluation (Signed)
Anesthesia Post Note ? ?Patient: Wayne Jones ? ?Procedure(s) Performed: COLONOSCOPY WITH PROPOFOL ?ESOPHAGOGASTRODUODENOSCOPY (EGD) WITH PROPOFOL ? ?Patient location during evaluation: PACU ?Anesthesia Type: General ?Level of consciousness: awake and alert ?Pain management: pain level controlled ?Vital Signs Assessment: post-procedure vital signs reviewed and stable ?Respiratory status: spontaneous breathing, nonlabored ventilation, respiratory function stable and patient connected to nasal cannula oxygen ?Cardiovascular status: blood pressure returned to baseline and stable ?Postop Assessment: no apparent nausea or vomiting ?Anesthetic complications: no ? ? ?No notable events documented. ? ? ?Last Vitals:  ?Vitals:  ? 11/19/21 1200 11/19/21 1210  ?BP: 134/69 137/73  ?Pulse: 70 67  ?Resp: 15 14  ?Temp:    ?SpO2: 100%   ?  ?Last Pain:  ?Vitals:  ? 11/19/21 1150  ?TempSrc: Temporal  ?PainSc:   ? ? ?  ?  ?  ?  ?  ?  ? ?Molli Barrows ? ? ? ? ?

## 2021-12-10 ENCOUNTER — Ambulatory Visit
Admission: EM | Admit: 2021-12-10 | Discharge: 2021-12-10 | Disposition: A | Discharge status: Other Acute Inpatient Hospital | Payer: Medicare Other | Attending: Emergency Medicine | Admitting: Emergency Medicine

## 2021-12-10 ENCOUNTER — Encounter: Payer: Self-pay | Admitting: Emergency Medicine

## 2021-12-10 DIAGNOSIS — E785 Hyperlipidemia, unspecified: Secondary | ICD-10-CM | POA: Diagnosis not present

## 2021-12-10 DIAGNOSIS — R1031 Right lower quadrant pain: Secondary | ICD-10-CM | POA: Diagnosis not present

## 2021-12-10 DIAGNOSIS — E039 Hypothyroidism, unspecified: Secondary | ICD-10-CM | POA: Diagnosis not present

## 2021-12-10 DIAGNOSIS — N401 Enlarged prostate with lower urinary tract symptoms: Secondary | ICD-10-CM | POA: Diagnosis not present

## 2021-12-10 DIAGNOSIS — Z79899 Other long term (current) drug therapy: Secondary | ICD-10-CM | POA: Diagnosis not present

## 2021-12-10 DIAGNOSIS — R1032 Left lower quadrant pain: Secondary | ICD-10-CM | POA: Diagnosis not present

## 2021-12-10 DIAGNOSIS — E860 Dehydration: Secondary | ICD-10-CM

## 2021-12-10 DIAGNOSIS — K5730 Diverticulosis of large intestine without perforation or abscess without bleeding: Secondary | ICD-10-CM | POA: Diagnosis not present

## 2021-12-10 DIAGNOSIS — K449 Diaphragmatic hernia without obstruction or gangrene: Secondary | ICD-10-CM | POA: Diagnosis not present

## 2021-12-10 DIAGNOSIS — Z7982 Long term (current) use of aspirin: Secondary | ICD-10-CM | POA: Diagnosis not present

## 2021-12-10 DIAGNOSIS — R1084 Generalized abdominal pain: Secondary | ICD-10-CM | POA: Diagnosis not present

## 2021-12-10 DIAGNOSIS — R110 Nausea: Secondary | ICD-10-CM | POA: Diagnosis not present

## 2021-12-10 DIAGNOSIS — R161 Splenomegaly, not elsewhere classified: Secondary | ICD-10-CM | POA: Diagnosis not present

## 2021-12-10 DIAGNOSIS — N4889 Other specified disorders of penis: Secondary | ICD-10-CM | POA: Diagnosis not present

## 2021-12-10 DIAGNOSIS — R21 Rash and other nonspecific skin eruption: Secondary | ICD-10-CM | POA: Diagnosis not present

## 2021-12-10 DIAGNOSIS — F3489 Other specified persistent mood disorders: Secondary | ICD-10-CM | POA: Diagnosis not present

## 2021-12-10 DIAGNOSIS — I10 Essential (primary) hypertension: Secondary | ICD-10-CM | POA: Diagnosis not present

## 2021-12-10 DIAGNOSIS — F319 Bipolar disorder, unspecified: Secondary | ICD-10-CM | POA: Diagnosis not present

## 2021-12-10 LAB — URINALYSIS, ROUTINE W REFLEX MICROSCOPIC
Glucose, UA: NEGATIVE mg/dL
Hgb urine dipstick: NEGATIVE
Ketones, ur: >160 mg/dL — AB
Leukocytes,Ua: NEGATIVE
Nitrite: NEGATIVE
Protein, ur: 30 mg/dL — AB
Specific Gravity, Urine: 1.02 (ref 1.005–1.030)
pH: 7 (ref 5.0–8.0)

## 2021-12-10 LAB — URINALYSIS, MICROSCOPIC (REFLEX)
Bacteria, UA: NONE SEEN
RBC / HPF: NONE SEEN RBC/hpf (ref 0–5)

## 2021-12-10 NOTE — ED Provider Notes (Signed)
?[facility] ? ? ? ?CSN: [id number] ?Arrival date & time: 12/10/21  1657 ? ? ?  ? ?History   ?Chief Complaint ?Chief Complaint  ?Patient presents with  ? Groin Pain  ? ? ?HPI ?Wayne Jones is a 61 y.o. male.  ? 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  Guardian states she has been putting nystatin ointment on the groin and the patient has been followed by urologist at [facility], although he has not been recently, nor have they contacted his urologist regarding today's complaint.  Guardian also states he has a sore on his penis with loss of appetite.  Patient is alert and ambulatory in urgent care, difficult to assess pt, as soon as provider and staff walk in room, pt pulls his pants down, accompanied/chaperoned by Azzel, CMA. ? ?The history is provided by the patient. No language interpreter was used.  ? ?Past Medical History:  ?Diagnosis Date  ? Depression   ? Diabetes mellitus without complication ([place])   ? Hyperlipidemia   ? Hypertension   ? Mental disorder   ? ? ?Patient Active Problem List  ? Diagnosis Date Noted  ? Dehydration 12/10/2021  ? Bilateral groin pain 12/10/2021  ? Weight loss, unintentional   ? Abnormal CT scan, stomach   ? Polyp of colon   ? Osteoarthritis of knee 03/26/2020  ? Varicose veins, lower extremity, with inflammation, ulcerated, unspecified laterality ([place]) 05/26/2017  ? Bilateral lower extremity edema 04/14/2017  ? Thrombocytopenia ([place]) 07/20/2016  ? Intellectual disability 03/17/2016  ? Polypharmacy 01/10/2016  ? Benign prostatic hypertrophy without urinary obstruction 01/09/2015  ? Chronic kidney disease (CKD), stage I 01/09/2015  ? Cognitive decline 01/09/2015  ? History of diabetes mellitus, type II 01/09/2015  ? Acid reflux 01/09/2015  ? Hearing loss 01/09/2015  ? HLD (hyperlipidemia) 01/09/2015  ?  Morbid obesity, unspecified obesity type ([place]) 01/09/2015  ? Obstructive sleep apnea 01/09/2015  ? Other specified causes of urethral stricture 01/09/2015  ? Hernia of anterior abdominal wall 01/09/2015  ? Incomplete bladder emptying 07/09/2012  ? 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 Benign essential HTN 02/04/2007  ? Bipolar I disorder, single manic episode, moderate ([place]) 02/04/2007  ? ? ?Past Surgical History:  ?Procedure Laterality Date  ? COLONOSCOPY WITH PROPOFOL N/A 11/19/2021  ? Procedure: COLONOSCOPY WITH PROPOFOL;  Surgeon: Lin Landsman, MD;  Location: [facility] ENDOSCOPY;  Service: Gastroenterology;  Laterality: N/A;  ? ESOPHAGOGASTRODUODENOSCOPY (EGD) WITH PROPOFOL N/A 11/19/2021  ? Procedure: ESOPHAGOGASTRODUODENOSCOPY (EGD) WITH PROPOFOL;  Surgeon: Lin Landsman, MD;  Location[facility] ENDOSCOPY;  Service: Gastroenterology;  Laterality: N/A;  ? LAPAROSCOPIC CHOLECYSTECTOMY    ? LAPAROSCOPY  12/03/2010  ? VENTRAL HERNIA REPAIR    ? ? ? ? ? ?Home Medications   ? ?Prior to Admission medications   ?Medication Sig Start Date End Date Taking? Authorizing Provider  ?ARIPiprazole (ABILIFY) 20 MG tablet Take 20 mg by mouth daily.    [provider]  ?atorvastatin (LIPITOR) 10 MG tablet TAKE 1 TABLET BY MOUTH ONCE DAILY 10/03/21   Ancil Boozer, Drue Stager, MD  ?carbamide peroxide (DEBROX) 6.5 % OTIC solution PLACE 5 DROPS INTO BOTH EARS TWO TIMES A DAY 10/13/18   Steele Sizer, MD  ?Cholecalciferol (VITAMIN D3) 1.25 MG (50000 UT) CAPS Take 1 capsule by mouth every 14 (fourteen) days.    [provider]  ?  clonazePAM (KLONOPIN) 1 MG tablet Take 1 mg by mouth daily.    Alvy Bimler, MD  ?Cyanocobalamin 1000 MCG/15ML LIQD Take 5 mLs (333.3333 mcg total) by mouth daily. 03/26/21   Steele Sizer, MD  ?DENTA 5000 PLUS 1.1 % CREA dental cream Take by mouth. 01/27/20   [provider]  ?Elastic Bandages & Supports (MEDICAL COMPRESSION SOCKS) MISC 2 each by Does not apply route daily. Apply in am's  and remove it at bedtime 09/04/21   Teodora Medici, DO  ?famotidine (PEPCID) 40 MG tablet TAKE 1 TABLET BY MOUTH ONCE DAILY 09/10/21   Steele Sizer, MD  ?feeding supplement, ENSURE COMPLETE, (ENSURE COMPLETE) LIQD Take 237 mLs by mouth 2 (two) times daily between meals. 04/02/21   Steele Sizer, MD  ?fexofenadine [facility] ALLERGY) 60 MG tablet Take 1 tablet (60 mg total) by mouth 2 (two) times daily. 09/04/21   Teodora Medici, DO  ?finasteride (PROSCAR) 5 MG tablet Take 5 mg by mouth daily.    [provider]  ?FLUoxetine (PROZAC) 20 MG capsule TAKE 1 CAPSULE BY MOUTH DAILY 06/15/20   Steele Sizer, MD  ?fluticasone (FLONASE) 50 MCG/ACT nasal spray Place 2 sprays into both nostrils daily. 09/04/21   Teodora Medici, DO  ?gabapentin (NEURONTIN) 600 MG tablet Take 600 mg by mouth at bedtime.    [provider]  ?ketoconazole (NIZORAL) 2 % cream Apply 1 application topically daily. Groin rash 03/05/20   Steele Sizer, MD  ?lisinopril (ZESTRIL) 10 MG tablet TAKE 1 TABLET BY MOUTH ONCE DAILY 10/03/21   Steele Sizer, MD  ?Lubricants (K-Y JELLY) GEL USE  AS NEEDED TO BE ABLE TO MASTURBATE 01/29/21   Ancil Boozer, Drue Stager, MD  ?meloxicam (MOBIC) 15 MG tablet Take 1 tablet (15 mg total) by mouth daily as needed for pain. Please don't give it daily 03/06/21   Steele Sizer, MD  ?nystatin cream (MYCOSTATIN) APPLY TOPICALLY TWO TIMES A DAY 08/27/20   Steele Sizer, MD  ?omega-3 acid ethyl esters (LOVAZA) 1 g capsule TAKE 1 CAPSULE BY MOUTH 2 TIMES DAILY ?Patient not taking: Reported on 10/28/2021 10/03/21   Steele Sizer, MD  ?omeprazole (PRILOSEC) 20 MG capsule TAKE 1 CAPSULE BY MOUTH ONCE DAILY *DO NOT CRUSH OR CHEW* 10/03/21   Ancil Boozer, Drue Stager, MD  ?ondansetron (ZOFRAN) 4 MG tablet TAKE 1 TABLET BY MOUTH EVERY EIGHT HOURS AS NEEDED FOR NAUSEA / VOMITING 05/14/20   Steele Sizer, MD  ?tamsulosin (FLOMAX) 0.4 MG CAPS capsule Take 1 capsule (0.4 mg total) by mouth daily. 10/03/21   Steele Sizer, MD   ?Vitamins A & D (A+D PREVENT) OINT ointment APPLY TOPICALLY AS NEEDED FOR DRY SKIN 09/09/21   Steele Sizer, MD  ? ? ?Family History ?Family History  ?Family history unknown: Yes  ? ? ?Social History ?Social History  ? ?Tobacco Use  ? Smoking status: Never  ? Smokeless tobacco: Never  ?Vaping Use  ? Vaping Use: Never used  ?Substance Use Topics  ? Alcohol use: No  ?  Alcohol/week: 0.0 standard drinks  ? Drug use: No  ? ? ? ?Allergies   ?Patient has no known allergies. ? ? ?Review of Systems ?Review of Systems  ?Constitutional:  Positive for appetite change. Negative for activity change and fever.  ?Gastrointestinal:  Negative for abdominal pain.  ?Genitourinary:  Positive for genital sores, penile pain and testicular pain.  ?All other systems reviewed and are negative. ? ? ?Physical Exam ?Triage Vital Signs ?ED Triage Vitals  ?Enc Vitals Group  ?  BP 12/10/21 1722 123/74  ?   Pulse Rate 12/10/21 1722 71  ?   Resp 12/10/21 1722 16  ?   Temp 12/10/21 1722 97.9 ?F (36.6 ?C)  ?   Temp Source 12/10/21 1722 Oral  ?   SpO2 12/10/21 1722 96 %  ?   Weight 12/10/21 1721 214 lb (97.1 kg)  ?   Height 12/10/21 1721 '6\' 4"'$  (1.93 m)  ?   Head Circumference --   ?   Peak Flow --   ?   Pain Score --   ?   Pain Loc --   ?   Pain Edu? --   ?   Excl. in [place]? --   ? ?No data found. ? ?Updated Vital Signs ?BP 123/74 (BP Location: Right Arm)   Pulse 71   Temp 97.9 ?F (36.6 ?C) (Oral)   Resp 16   Ht '6\' 4"'$  (1.93 m)   Wt 214 lb (97.1 kg)   SpO2 96%   BMI 26.05 kg/m?  ? ?Visual Acuity ?Right Eye Distance:   ?Left Eye Distance:   ?Bilateral Distance:   ? ?Right Eye Near:   ?Left Eye Near:    ?Bilateral Near:    ? ?Physical Exam ?Vitals and nursing note reviewed. Exam conducted with a chaperone present.  ?Constitutional:   ?   General: He is not in acute distress. ?   Appearance: He is well-developed.  ?HENT:  ?   Head: Normocephalic and atraumatic.  ?Cardiovascular:  ?   Rate and Rhythm: Normal rate and regular rhythm.  ?   Heart  sounds: No murmur heard. ?Pulmonary:  ?   Effort: Pulmonary effort is normal. No respiratory distress.  ?   Breath sounds: Normal breath sounds.  ?Abdominal:  ?   Palpations: Abdomen is soft.  ?   Tenderness: There is no abdom

## 2021-12-10 NOTE — Discharge Instructions (Addendum)
Go straight to ER, Will most likely need ultrasound for groin pain as well urine shows over 160 ketones in UA, no UTI, will need to go immediately to Er for further evaluation and workup, will need IV fluids, labs and imaging.  ?

## 2021-12-10 NOTE — ED Notes (Signed)
Patient is being discharged from the Urgent Care and sent to the Emergency Department via Personal Vehicle . Per Jeanett Schlein, NP, patient is in need of higher level of care due to Dehydration. Patient is aware and verbalizes understanding of plan of care.  ?Vitals:  ? 12/10/21 1722  ?BP: 123/74  ?Pulse: 71  ?Resp: 16  ?Temp: 97.9 ?F (36.6 ?C)  ?SpO2: 96%  ?  ?

## 2021-12-10 NOTE — ED Triage Notes (Signed)
Pt presents with caregiver,  ?Caregiver reports pt has been complaining of groin pain. States the pt also has sores on his penis.  ?Also complains of loss of appetite and nasal congestion.  ?

## 2021-12-11 DIAGNOSIS — R161 Splenomegaly, not elsewhere classified: Secondary | ICD-10-CM | POA: Diagnosis not present

## 2021-12-11 DIAGNOSIS — K5730 Diverticulosis of large intestine without perforation or abscess without bleeding: Secondary | ICD-10-CM | POA: Diagnosis not present

## 2021-12-11 DIAGNOSIS — K449 Diaphragmatic hernia without obstruction or gangrene: Secondary | ICD-10-CM | POA: Diagnosis not present

## 2021-12-20 ENCOUNTER — Other Ambulatory Visit: Payer: Self-pay | Admitting: Family Medicine

## 2021-12-20 DIAGNOSIS — N481 Balanitis: Secondary | ICD-10-CM

## 2021-12-20 DIAGNOSIS — Z23 Encounter for immunization: Secondary | ICD-10-CM

## 2021-12-20 MED ORDER — SHINGRIX 50 MCG/0.5ML IM SUSR: INTRAMUSCULAR | 1 refills | Status: DC

## 2022-01-06 DIAGNOSIS — E119 Type 2 diabetes mellitus without complications: Secondary | ICD-10-CM | POA: Diagnosis not present

## 2022-01-06 DIAGNOSIS — H2513 Age-related nuclear cataract, bilateral: Secondary | ICD-10-CM | POA: Diagnosis not present

## 2022-01-06 DIAGNOSIS — H35371 Puckering of macula, right eye: Secondary | ICD-10-CM | POA: Diagnosis not present

## 2022-01-06 LAB — HM DIABETES EYE EXAM

## 2022-01-10 DIAGNOSIS — F3489 Other specified persistent mood disorders: Secondary | ICD-10-CM | POA: Diagnosis not present

## 2022-01-15 NOTE — Progress Notes (Signed)
Name: Wayne Jones   MRN: [id number]    DOB: [birth year]   Date:01/16/2022       Progress Note  Subjective  Chief Complaint  Follow Up  HPI  He came in today with Phillips Odor   History of DM: his A1C has been normal for over one year without medication.   He is on statin therapy No symptoms of hypoglycemia. He is losing weight, last A1C was below 5. Unchanged   HTN: he is off beta blocker, only on low dose Lisinopril  bp today is towards low end of normal, he has lost a lot of weight and we may need to stop medications if bp drops more   BPH: he is under the care of Dr. Shirlean Kelly, he states no longer having nocturia, he still masturbates often and causes penile irritation.  He is on Proscar and Flomax, last PSA   Dyslipidemia: taking statin and Lovaza. LDL is at goal . Reviewed labs done 09/22   Bipolar disorder: seeing psychiatrist - Dr Tamera Punt and currently off Prozac and titrating dose of Duloxetine. He is calm, but he is now masturbating in public.    Malnutrition: normal appetite, he continues to lose weigh, lost another 10 lbs since March 2022   Caregiver states he seems to have an normal appetite. His wait was in the 260 lbs back in 2019, 250 lbs in 2020's he was  to 231 lbs end of 2022 and today is down to 206 lbs. He had EGD and colonoscopy since previous positive cologuard that was unremarkable, he is already seen by Dr. Tasia Catchings.   Thrombocytopenia: seeing Dr. Tasia Catchings. Per her notes secondary to Abilify and carbamazepine. Stable.   OA knee: he was given knee braces by ortho but he does not seem to be wearing a brace consistently, he has a mild antalgic gait    Patient Active Problem List   Diagnosis Date Noted   Dehydration 12/10/2021   Bilateral groin pain 12/10/2021   Weight loss, unintentional    Abnormal CT scan, stomach    Polyp of colon    Osteoarthritis of knee 03/26/2020   Varicose veins, lower extremity, with inflammation, ulcerated, unspecified laterality ([place]) 05/26/2017    Bilateral lower extremity edema 04/14/2017   Thrombocytopenia ([place]) 07/20/2016   Intellectual disability 03/17/2016   Polypharmacy 01/10/2016   Benign prostatic hypertrophy without urinary obstruction 01/09/2015   Chronic kidney disease (CKD), stage I 01/09/2015   Cognitive decline 01/09/2015   History of diabetes mellitus, type II 01/09/2015   Acid reflux 01/09/2015   Hearing loss 01/09/2015   HLD (hyperlipidemia) 01/09/2015   Morbid obesity, unspecified obesity type ([place]) 01/09/2015   Obstructive sleep apnea 01/09/2015   Other specified causes of urethral stricture 01/09/2015   Hernia of anterior abdominal wall 01/09/2015   Incomplete bladder emptying 07/09/2012   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   Benign essential HTN 02/04/2007   Bipolar I disorder, single manic episode, moderate ([place]) 02/04/2007    Past Surgical History:  Procedure Laterality Date   COLONOSCOPY WITH PROPOFOL N/A 11/19/2021   Procedure: COLONOSCOPY WITH PROPOFOL;  Surgeon: Lin Landsman, MD;  Location: [facility] ENDOSCOPY;  Service: Gastroenterology;  Laterality: N/A;   ESOPHAGOGASTRODUODENOSCOPY (EGD) WITH PROPOFOL N/A 11/19/2021   Procedure: ESOPHAGOGASTRODUODENOSCOPY (EGD) WITH PROPOFOL;  Surgeon: Lin Landsman, MD;  Location: [facility] ENDOSCOPY;  Service: Gastroenterology;  Laterality: N/A;   LAPAROSCOPIC CHOLECYSTECTOMY     LAPAROSCOPY  12/03/2010   VENTRAL HERNIA REPAIR      Family History  Family history unknown: Yes    Social History   Tobacco Use   Smoking status: Never   Smokeless tobacco: Never  Substance Use Topics   Alcohol use: No    Alcohol/week: 0.0 standard drinks of alcohol     Current Outpatient Medications:    acetaminophen (TYLENOL) 500 MG tablet, Take 500 mg by mouth 3 (three) times daily., Disp: , Rfl:    antiseptic oral rinse (BIOTENE) LIQD, 15 mLs by Mouth Rinse route as needed for dry mouth., Disp: , Rfl:    ARIPiprazole (ABILIFY) 20 MG tablet, Take 20 mg by mouth  daily., Disp: , Rfl:    atorvastatin (LIPITOR) 10 MG tablet, TAKE 1 TABLET BY MOUTH ONCE DAILY, Disp: 7 tablet, Rfl: 11   carbamazepine (EQUETRO) 200 MG CP12 12 hr capsule, Take 200 mg by mouth 2 (two) times daily., Disp: , Rfl:    carbamide peroxide (DEBROX) 6.5 % OTIC solution, PLACE 5 DROPS INTO BOTH EARS TWO TIMES A DAY, Disp: 15 mL, Rfl: 0   Cholecalciferol (VITAMIN D3) 1.25 MG (50000 UT) CAPS, Take 1 capsule by mouth every 14 (fourteen) days., Disp: , Rfl:    clonazePAM (KLONOPIN) 1 MG tablet, Take 1 mg by mouth daily., Disp: , Rfl:    Cyanocobalamin 1000 MCG/15ML LIQD, Take 5 mLs (333.3333 mcg total) by mouth daily., Disp: 450 mL, Rfl: 5   DENTA 5000 PLUS 1.1 % CREA dental cream, Take by mouth., Disp: , Rfl:    DULoxetine (CYMBALTA) 20 MG capsule, Take 20 mg by mouth daily., Disp: , Rfl:    DULoxetine (CYMBALTA) 30 MG capsule, Take 30 mg by mouth daily., Disp: , Rfl:    DULoxetine (CYMBALTA) 60 MG capsule, Take 60 mg by mouth daily., Disp: , Rfl:    Elastic Bandages & Supports (MEDICAL COMPRESSION SOCKS) MISC, 2 each by Does not apply route daily. Apply in am's and remove it at bedtime, Disp: 2 each, Rfl: 1   famotidine (PEPCID) 40 MG tablet, TAKE 1 TABLET BY MOUTH ONCE DAILY, Disp: 7 tablet, Rfl: 10   feeding supplement, ENSURE COMPLETE, (ENSURE COMPLETE) LIQD, Take 237 mLs by mouth 2 (two) times daily between meals., Disp: 237 mL, Rfl: 3   fexofenadine (ALLEGRA ALLERGY) 60 MG tablet, Take 1 tablet (60 mg total) by mouth 2 (two) times daily., Disp: 90 tablet, Rfl: 3   finasteride (PROSCAR) 5 MG tablet, Take 5 mg by mouth daily., Disp: , Rfl:    FLUoxetine (PROZAC) 20 MG capsule, TAKE 1 CAPSULE BY MOUTH DAILY, Disp: 7 capsule, Rfl: 10   fluticasone (FLONASE) 50 MCG/ACT nasal spray, Place 2 sprays into both nostrils daily., Disp: 16 g, Rfl: 6   gabapentin (NEURONTIN) 300 MG capsule, Take 300 mg by mouth in the morning., Disp: , Rfl:    gabapentin (NEURONTIN) 600 MG tablet, Take 600 mg by  mouth at bedtime., Disp: , Rfl:    ketoconazole (NIZORAL) 2 % cream, Apply 1 application topically daily. Groin rash, Disp: 60 g, Rfl: 1   lisinopril (ZESTRIL) 10 MG tablet, TAKE 1 TABLET BY MOUTH ONCE DAILY, Disp: 7 tablet, Rfl: 11   Lubricants (K-Y JELLY) GEL, USE  AS NEEDED TO BE ABLE TO MASTURBATE, Disp: 57 g, Rfl: 10   meloxicam (MOBIC) 15 MG tablet, Take 1 tablet (15 mg total) by mouth daily as needed for pain. Please don't give it daily, Disp: 30 tablet, Rfl: 5   nystatin cream (MYCOSTATIN), APPLY TOPICALLY TWICE DAILY, Disp: 30 g, Rfl: 10  omega-3 acid ethyl esters (LOVAZA) 1 g capsule, TAKE 1 CAPSULE BY MOUTH 2 TIMES DAILY, Disp: 14 capsule, Rfl: 11   omeprazole (PRILOSEC) 20 MG capsule, TAKE 1 CAPSULE BY MOUTH ONCE DAILY *DO NOT CRUSH OR CHEW*, Disp: 30 capsule, Rfl: 11   ondansetron (ZOFRAN) 4 MG tablet, TAKE 1 TABLET BY MOUTH EVERY EIGHT HOURS AS NEEDED FOR NAUSEA / VOMITING, Disp: 30 tablet, Rfl: 11   Polyethyl Glycol-Propyl Glycol (SYSTANE) 0.4-0.3 % SOLN, Apply to eye., Disp: , Rfl:    tamsulosin (FLOMAX) 0.4 MG CAPS capsule, Take 1 capsule (0.4 mg total) by mouth daily., Disp: 30 capsule, Rfl: 12   Vitamins A & D (A+D PREVENT) OINT ointment, APPLY TOPICALLY AS NEEDED FOR DRY SKIN, Disp: 113 g, Rfl: 11  No Known Allergies  I personally reviewed active problem list, medication list, allergies, family history, social history, health maintenance with the patient/caregiver today.   ROS  Constitutional: Negative for fever, positive for  weight change.  Respiratory: Negative for cough and shortness of breath.   Cardiovascular: Negative for chest pain or palpitations.  Gastrointestinal: Negative for abdominal pain, no bowel changes.  Musculoskeletal: positive  for gait problem and knee  joint swelling.  Skin: Negative for rash.  Neurological: Negative for dizziness or headache.  No other specific complaints in a complete review of systems (except as listed in HPI above).    Objective  Vitals:   01/16/22 1022  BP: 118/84  Pulse: 74  Resp: 16  SpO2: 100%  Weight: 206 lb (93.4 kg)  Height: '6\' 4"'$  (1.93 m)    Body mass index is 25.08 kg/m.  Physical Exam  Constitutional: Patient appears well-developed and malnourished  No distress.  HEENT: head atraumatic, normocephalic, pupils equal and reactive to light, neck supple Cardiovascular: Normal rate, regular rhythm and normal heart sounds.  No murmur heard. No BLE edema. Pulmonary/Chest: Effort normal and breath sounds normal. No respiratory distress. Abdominal: Soft.  There is no tenderness. Psychiatric: Cooperative History given by caregiver   Recent Results (from the past 2160 hour(s))  Glucose, capillary     Status: Abnormal   Collection Time: 11/19/21 10:35 AM  Result Value Ref Range   Glucose-Capillary 122 (H) 70 - 99 mg/dL    Comment: Glucose reference range applies only to samples taken after fasting for at least 8 hours.  Surgical pathology     Status: None   Collection Time: 11/19/21 11:16 AM  Result Value Ref Range   SURGICAL PATHOLOGY      SURGICAL PATHOLOGY CASE: [id number] PATIENT: Antony Salmon Surgical Pathology Report     Specimen Submitted: A. Duodenum; cbx B. Stomach, random; cbx C. Colon polyp x2, transverse; cold snare D. Colon polyp, descending; cold snare E. Colon polyp, sigmoid; cold snare  Clinical History: Colonoscopy and EGD for weight loss R63.4.  Abnormal CT scan R93.3.  Normal upper endoscopy; colon polyps    DIAGNOSIS: A. DUODENUM; COLD BIOPSY: - ENTERIC MUCOSA WITH PRESERVED VILLOUS ARCHITECTURE AND NO SIGNIFICANT HISTOPATHOLOGIC CHANGE. - NEGATIVE FOR FEATURES OF CELIAC, DYSPLASIA, AND MALIGNANCY.  B. STOMACH, RANDOM; COLD BIOPSY: - GASTRIC ANTRAL AND OXYNTIC MUCOSA WITH NO SIGNIFICANT HISTOPATHOLOGIC CHANGE. - NEGATIVE FOR H. PYLORI, DYSPLASIA, AND MALIGNANCY.  C. COLON POLYPS X2, TRANSVERSE; COLD SNARE: - MULTIPLE FRAGMENTS OF TUBULAR  ADENOMAS. - NEGATIVE FOR HIGH-GRADE DYSPLASIA AND MALIGNANCY.  D. COLON POLYP, DESCENDING; COLD SNARE: - TUBULAR ADENOMA. - NE GATIVE FOR HIGH-GRADE DYSPLASIA AND MALIGNANCY.  E. COLON POLYP, SIGMOID; COLD SNARE: - HYPERPLASTIC POLYP. -  NEGATIVE FOR DYSPLASIA AND MALIGNANCY.  GROSS DESCRIPTION: A. Labeled: Cbx duodenum rule out celiac disease Received: Formalin Collection time: 11:16 AM on 11/19/2021 Placed into formalin time: 11:16 AM on 11/19/2021 Tissue fragment(s): 3 Size: Aggregate, 1.0 x 0.4 x 0.1 cm Description: Pink soft tissue fragments Entirely submitted in 1 cassette.  B. Labeled: Cbx random gastric Received: Formalin Collection time: 11:19 AM on 11/19/2021 Placed into formalin time: 11:19 AM on 11/19/2021 Tissue fragment(s): Multiple Size: Aggregate, 1.0 x 0.3 x 0.1 cm Description: Tan soft tissue fragments Entirely submitted in 1 cassette.  C. Labeled: Cold snare polyp transverse colon x2 Received: Formalin Collection time: 11:35 AM on 11/19/2021 Placed into formalin time: 11:35 AM on 11/19/2021 Tissue fragment(s): Multiple Size: Aggregate, 2.1 x 0.4 x 0.1 cm Description:  Tan soft tissue fragments Entirely submitted in 1 cassette.  D. Labeled: Cold snare polyp descending colon Received: Formalin Collection time: 11:40 AM on 11/19/2021 Placed into formalin time: 11:40 AM on 11/19/2021 Tissue fragment(s): Multiple Size: Aggregate, 1.5 x 0.5 x 0.1 cm Description: Received are tan soft tissue fragments, admixed with intestinal debris.  The ratio of soft tissue to intestinal debris is 10: 90. Entirely submitted in 1 cassette.  E. Labeled: Cold snare polyp sigmoid colon Received: Formalin Collection time: 11:42 AM on 11/19/2021 Placed into formalin time: 11:42 AM on 11/19/2021 Tissue fragment(s): 2 Size: Ranges from 0.2-0.4 cm Description: Pink soft tissue fragments Entirely submitted in 1 cassette.  CM 11/19/2021  Final Diagnosis performed by Allena Napoleon,  MD.   Electronically signed 11/20/2021 3:58:29PM The electronic signature indicates that the named Attending Pathologist has evaluated the specimen Technical component performed at [facility], [address] [place], [place] [address] Lab: [phone] Dir: Rush Farmer, MD, MMM  Professional component performed at [facility], [facility], [place], [place], [place] [address] Lab: [phone] Dir: Kathi Simpers, MD   Urinalysis, Routine w reflex microscopic Urine, Clean Catch     Status: Abnormal   Collection Time: 12/10/21  5:59 PM  Result Value Ref Range   Color, Urine AMBER (A) YELLOW    Comment: BIOCHEMICALS MAY BE AFFECTED BY COLOR   APPearance CLEAR CLEAR   Specific Gravity, Urine 1.020 1.005 - 1.030   pH 7.0 5.0 - 8.0   Glucose, UA NEGATIVE NEGATIVE mg/dL   Hgb urine dipstick NEGATIVE NEGATIVE   Bilirubin Urine SMALL (A) NEGATIVE   Ketones, ur >160 (A) NEGATIVE mg/dL   Protein, ur 30 (A) NEGATIVE mg/dL   Nitrite NEGATIVE NEGATIVE   Leukocytes,Ua NEGATIVE NEGATIVE    Comment: Performed at [facility], [address], [place], [place] [address]  Urinalysis, Microscopic (reflex)     Status: None   Collection Time: 12/10/21  5:59 PM  Result Value Ref Range   RBC / HPF NONE SEEN 0 - 5 RBC/hpf   WBC, UA 0-5 0 - 5 WBC/hpf   Bacteria, UA NONE SEEN NONE SEEN   Squamous Epithelial / LPF 0-5 0 - 5   Mucus PRESENT     Comment: Performed at [facility], [address], [place],  [address]    PHQ2/9:    01/16/2022   10:21 AM 10/03/2021    9:53 AM 09/13/2021    8:24 AM 09/04/2021    9:16 AM 07/09/2021    2:31 PM  Depression screen PHQ 2/9  Decreased Interest 1 0 0 1 0  Down, Depressed, Hopeless 0 0 0 0 0  PHQ - 2 Score 1  0 0 1 0  Altered sleeping  0 0 0   Tired, decreased energy  0 0 0   Change in appetite  0 0 0   Feeling bad or failure about yourself   0 0 0   Trouble concentrating  0 0 0   Moving slowly or  fidgety/restless  0 0 0   Suicidal thoughts  0 0 0   PHQ-9 Score  0 0 1   Difficult doing work/chores  Not difficult at all  Not difficult at all     phq 9 is negative   Fall Risk:    01/16/2022   10:21 AM 10/03/2021    9:52 AM 09/13/2021    8:24 AM 09/04/2021    9:16 AM 07/09/2021    2:34 PM  Fall Risk   Falls in the past year? 0 0 0 0 0  Number falls in past yr: 0 0 0 0 0  Injury with Fall? 0 0 0 0 0  Risk for fall due to : No Fall Risks No Fall Risks No Fall Risks  No Fall Risks  Follow up Falls prevention discussed Falls prevention discussed Falls prevention discussed Falls evaluation completed Falls prevention discussed      Functional Status Survey: Is the patient deaf or have difficulty hearing?: No Does the patient have difficulty seeing, even when wearing glasses/contacts?: No Does the patient have difficulty concentrating, remembering, or making decisions?: No Does the patient have difficulty walking or climbing stairs?: Yes Does the patient have difficulty dressing or bathing?: No Does the patient have difficulty doing errands alone such as visiting a doctor's office or shopping?: Yes    Assessment & Plan  Problem List Items Addressed This Visit     BPH with obstruction/lower urinary tract symptoms   Benign essential HTN    He is taking lisinopril 10 mg if bp remains towards low end of normal we will decrease dose on his next visit      History of diabetes mellitus   Bipolar I disorder, single manic episode, moderate ([place]) - Primary    Medication is getting adjusted by Dr. Tamera Punt      GERD without esophagitis   Dyslipidemia   Thrombocytopenia ([place])    Under the care of Dr. Tasia Catchings      Primary osteoarthritis of both knees    Not wearing his brace       Relevant Medications   acetaminophen (TYLENOL) 500 MG tablet   B12 deficiency    Continue supplementation SL      Moderate protein-calorie malnutrition ([place])    He continues to lose weight, 10 lbs  since March, he has seen GI and hematologist Advised caregiver to check for moles during bath time  We also printed out high calorie diet

## 2022-01-16 ENCOUNTER — Encounter: Payer: Self-pay | Admitting: Family Medicine

## 2022-01-16 ENCOUNTER — Ambulatory Visit (INDEPENDENT_AMBULATORY_CARE_PROVIDER_SITE_OTHER): Payer: Medicare Other | Admitting: Family Medicine

## 2022-01-16 VITALS — BP 118/84 | HR 74 | Resp 16 | Ht 76.0 in | Wt 206.0 lb

## 2022-01-16 DIAGNOSIS — N138 Other obstructive and reflux uropathy: Secondary | ICD-10-CM | POA: Diagnosis not present

## 2022-01-16 DIAGNOSIS — E440 Moderate protein-calorie malnutrition: Secondary | ICD-10-CM | POA: Diagnosis not present

## 2022-01-16 DIAGNOSIS — E785 Hyperlipidemia, unspecified: Secondary | ICD-10-CM | POA: Diagnosis not present

## 2022-01-16 DIAGNOSIS — M170 Bilateral primary osteoarthritis of knee: Secondary | ICD-10-CM | POA: Insufficient documentation

## 2022-01-16 DIAGNOSIS — N401 Enlarged prostate with lower urinary tract symptoms: Secondary | ICD-10-CM

## 2022-01-16 DIAGNOSIS — K219 Gastro-esophageal reflux disease without esophagitis: Secondary | ICD-10-CM | POA: Diagnosis not present

## 2022-01-16 DIAGNOSIS — I10 Essential (primary) hypertension: Secondary | ICD-10-CM | POA: Diagnosis not present

## 2022-01-16 DIAGNOSIS — D696 Thrombocytopenia, unspecified: Secondary | ICD-10-CM

## 2022-01-16 DIAGNOSIS — E538 Deficiency of other specified B group vitamins: Secondary | ICD-10-CM | POA: Diagnosis not present

## 2022-01-16 DIAGNOSIS — Z8639 Personal history of other endocrine, nutritional and metabolic disease: Secondary | ICD-10-CM | POA: Diagnosis not present

## 2022-01-16 DIAGNOSIS — F3012 Manic episode without psychotic symptoms, moderate: Secondary | ICD-10-CM

## 2022-01-16 NOTE — Assessment & Plan Note (Signed)
Not wearing his brace

## 2022-01-16 NOTE — Assessment & Plan Note (Signed)
He continues to lose weight, 10 lbs since March, he has seen GI and hematologist Advised caregiver to check for moles during bath time  We also printed out high calorie diet

## 2022-01-16 NOTE — Assessment & Plan Note (Signed)
Medication is getting adjusted by Dr. Tamera Punt

## 2022-01-16 NOTE — Assessment & Plan Note (Signed)
Continue supplementation SL

## 2022-01-16 NOTE — Assessment & Plan Note (Signed)
He is taking lisinopril 10 mg if bp remains towards low end of normal we will decrease dose on his next visit

## 2022-01-16 NOTE — Patient Instructions (Signed)
High-Protein and High-Calorie Diet Eating high-protein and high-calorie foods can help you to gain weight, heal after an injury, and recover after an illness or surgery. The specific amount of daily protein and calories you need depends on: Your body weight. The reason this diet is recommended for you. Generally, a high-protein, high-calorie diet involves: Eating 250-500 extra calories each day. Making sure that you get enough of your daily calories from protein. Ask your health care provider how many of your calories should come from protein. Talk with a health care provider or a dietitian about how much protein and how many calories you need each day. Follow the diet as directed by your health care provider. What are tips for following this plan?  Reading food labels Check the nutrition facts label for calories, grams of fat and protein. Items with more than 4 grams of protein are high-protein foods. Preparing meals Add whole milk, half-and-half, or heavy cream to cereal, pudding, soup, or hot cocoa. Add whole milk to instant breakfast drinks. Add peanut butter to oatmeal or smoothies. Add powdered milk to baked goods, smoothies, or milkshakes. Add powdered milk, cream, or butter to mashed potatoes. Add cheese to cooked vegetables. Make whole-milk yogurt parfaits. Top them with granola, fruit, or nuts. Add cottage cheese to fruit. Add avocado, cheese, or both to sandwiches or salads. Add avocado to smoothies. Add meat, poultry, or seafood to rice, pasta, casseroles, salads, and soups. Use mayonnaise when making egg salad, chicken salad, or tuna salad. Use peanut butter as a dip for fruits and vegetables or as a topping for pretzels, celery, or crackers. Add beans to casseroles, dips, and spreads. Add pureed beans to sauces and soups. Replace calorie-free drinks with calorie-containing drinks, such as milk and fruit juice. Replace water with milk or heavy cream when making foods such as  oatmeal, pudding, or cocoa. Add oil or butter to cooked vegetables and grains. Add cream cheese to sandwiches or as a topping on crackers and bread. Make cream-based pastas and soups. General information Ask your health care provider if you should take a nutritional supplement. Try to eat six small meals each day instead of three large meals. A general goal is to eat every 2 to 3 hours. Eat a balanced diet. In each meal, include one food that is high in protein and one food with fat in it. Keep nutritious snacks available, such as nuts, trail mixes, dried fruit, and yogurt. If you have kidney disease or diabetes, talk with your health care provider about how much protein is safe for you. Too much protein may put extra stress on your kidneys. Drink your calories. Choose high-calorie drinks and have them after your meals. Consider setting a timer to remind you to eat. You will want to eat even if you do not feel very hungry. What high-protein foods should I eat?  Vegetables Soybeans. Peas. Grains Quinoa. Bulgur wheat. Buckwheat. Meats and other proteins Beef, pork, and poultry. Fish and seafood. Eggs. Tofu. Textured vegetable protein (TVP). Peanut butter. Nuts and seeds. Dried beans. Protein powders. Hummus. Dairy Whole milk. Whole-milk yogurt. Powdered milk. Cheese. Cottage Cheese. Eggnog. Beverages High-protein supplement drinks. Soy milk. Other foods Protein bars. The items listed above may not be a complete list of foods and beverages you can eat and drink. Contact a dietitian for more information. What high-calorie foods should I eat? Fruits Dried fruit. Fruit leather. Canned fruit in syrup. Fruit juice. Avocado. Vegetables Vegetables cooked in oil or butter. Fried potatoes. Grains   Pasta. Quick breads. Muffins. Pancakes. Ready-to-eat cereal. Meats and other proteins Peanut butter. Nuts and seeds. Dairy Heavy cream. Whipped cream. Cream cheese. Sour cream. Ice cream. Custard.  Pudding. Whole milk dairy products. Beverages Meal-replacement beverages. Nutrition shakes. Fruit juice. Seasonings and condiments Salad dressing. Mayonnaise. Alfredo sauce. Fruit preserves or jelly. Honey. Syrup. Sweets and desserts Cake. Cookies. Pie. Pastries. Candy bars. Chocolate. Fats and oils Butter or margarine. Oil. Gravy. Other foods Meal-replacement bars. The items listed above may not be a complete list of foods and beverages you can eat and drink. Contact a dietitian for more information. Summary A high-protein, high-calorie diet can help you gain weight or heal faster after an injury, illness, or surgery. To increase your protein and calories, add ingredients such as whole milk, peanut butter, cheese, beans, meat, or seafood to meal items. To get enough extra calories each day, include high-calorie foods and beverages at each meal. Adding a high-calorie drink or shake can be an easy way to help you get enough calories each day. Talk with your healthcare provider or dietitian about the best options for you. This information is not intended to replace advice given to you by your health care provider. Make sure you discuss any questions you have with your health care provider. Document Revised: 06/24/2020 Document Reviewed: 06/24/2020 Elsevier Patient Education  2023 Elsevier Inc.  

## 2022-01-16 NOTE — Assessment & Plan Note (Signed)
Under the care of Dr. Tasia Catchings

## 2022-01-28 ENCOUNTER — Encounter: Payer: Self-pay | Admitting: Family Medicine

## 2022-02-18 ENCOUNTER — Other Ambulatory Visit: Payer: Self-pay | Admitting: Family Medicine

## 2022-02-18 DIAGNOSIS — L304 Erythema intertrigo: Secondary | ICD-10-CM

## 2022-02-20 DIAGNOSIS — Z23 Encounter for immunization: Secondary | ICD-10-CM | POA: Diagnosis not present

## 2022-02-27 DIAGNOSIS — Z7982 Long term (current) use of aspirin: Secondary | ICD-10-CM | POA: Diagnosis not present

## 2022-02-27 DIAGNOSIS — F319 Bipolar disorder, unspecified: Secondary | ICD-10-CM | POA: Diagnosis not present

## 2022-02-27 DIAGNOSIS — E785 Hyperlipidemia, unspecified: Secondary | ICD-10-CM | POA: Diagnosis not present

## 2022-02-27 DIAGNOSIS — N401 Enlarged prostate with lower urinary tract symptoms: Secondary | ICD-10-CM | POA: Diagnosis not present

## 2022-02-27 DIAGNOSIS — N4889 Other specified disorders of penis: Secondary | ICD-10-CM | POA: Diagnosis not present

## 2022-02-27 DIAGNOSIS — L539 Erythematous condition, unspecified: Secondary | ICD-10-CM | POA: Diagnosis not present

## 2022-02-27 DIAGNOSIS — E119 Type 2 diabetes mellitus without complications: Secondary | ICD-10-CM | POA: Diagnosis not present

## 2022-02-27 DIAGNOSIS — R339 Retention of urine, unspecified: Secondary | ICD-10-CM | POA: Diagnosis not present

## 2022-02-27 DIAGNOSIS — R338 Other retention of urine: Secondary | ICD-10-CM | POA: Diagnosis not present

## 2022-02-27 DIAGNOSIS — Z79899 Other long term (current) drug therapy: Secondary | ICD-10-CM | POA: Diagnosis not present

## 2022-02-27 DIAGNOSIS — I10 Essential (primary) hypertension: Secondary | ICD-10-CM | POA: Diagnosis not present

## 2022-03-05 ENCOUNTER — Other Ambulatory Visit: Payer: Self-pay | Admitting: Internal Medicine

## 2022-03-05 DIAGNOSIS — J309 Allergic rhinitis, unspecified: Secondary | ICD-10-CM

## 2022-03-06 NOTE — Telephone Encounter (Signed)
Requested Prescriptions  Pending Prescriptions Disp Refills  . fexofenadine (ALLEGRA) 60 MG tablet [Pharmacy Med Name: Fexofenadine HCl 60 MG Tablet] 60 tablet 5    Sig: TAKE 1 TABLET BY MOUTH TWICE DAILY     Ear, Nose, and Throat:  Antihistamines Passed - 03/05/2022  9:53 AM      Passed - Valid encounter within last 12 months    Recent Outpatient Visits          1 month ago Bipolar I disorder, single manic episode, moderate ([place])   [facility] Steele Sizer, MD   5 months ago [facility], Drue Stager, MD   5 months ago Moderate protein-calorie malnutrition [facility])   [facility] Steele Sizer, MD   6 months ago [facility] Teodora Medici, DO   8 months ago Moderate protein-calorie malnutrition [facility])   [facility] Steele Sizer, MD      Future Appointments            In 1 month Ancil Boozer, Drue Stager, MD [facility], [place]   In 2 months Steele Sizer, MD [facility], [place]   In 4 months  [facility], [facility]

## 2022-03-12 DIAGNOSIS — B9689 Other specified bacterial agents as the cause of diseases classified elsewhere: Secondary | ICD-10-CM | POA: Diagnosis not present

## 2022-03-12 DIAGNOSIS — Z03818 Encounter for observation for suspected exposure to other biological agents ruled out: Secondary | ICD-10-CM | POA: Diagnosis not present

## 2022-03-12 DIAGNOSIS — R112 Nausea with vomiting, unspecified: Secondary | ICD-10-CM | POA: Diagnosis not present

## 2022-03-12 DIAGNOSIS — J019 Acute sinusitis, unspecified: Secondary | ICD-10-CM | POA: Diagnosis not present

## 2022-03-12 DIAGNOSIS — R300 Dysuria: Secondary | ICD-10-CM | POA: Diagnosis not present

## 2022-04-04 NOTE — Progress Notes (Unsigned)
Name: Wayne Jones   MRN: [id number]    DOB: Oct 13, 1960   Date:04/08/2022       Progress Note  Subjective  Chief Complaint  FL2  HPI  He came in today with [facility]    History of DM: his A1C has been normal for over one year without medication.   He is on statin therapy No symptoms of hypoglycemia. His weight is finally stable,  last A1C was 4.7 %  Unchanged    HTN: he is off beta blocker, only on low dose Lisinopril  bp today is towards low end of normal, he has lost a lot of weight and we may need to stop medications if bp drops more    BPH: he is under the care of Dr. Shirlean Kelly, he states no longer having nocturia, he still masturbates often and causes penile irritation.  He is on Proscar and Flomax, last PSA was normal Dec 2022    Dyslipidemia: taking statin and Lovaza. LDL is at goal . Reviewed labs done 09/22 and he is due for repeat levels    Bipolar disorder: seeing psychiatrist - Dr Tamera Punt and currently off Prozac and titrating dose of Duloxetine. He is calm    Malnutrition: normal appetite, he was losing weight steadily for over one year March 2022   Caregiver states he seems to have an normal appetite. His wait was in the 260 lbs back in 2019, 250 lbs in 2020's he was  to 231 lbs end of 2022 and last visit was 206 lbs. He had EGD and colonoscopy since previous positive cologuard that was unremarkable, he has  already seen by Dr. Tasia Catchings. His weight is stable today at 208 lbs and we will monitor    Thrombocytopenia: seeing Dr. Tasia Catchings. Per her notes secondary to Abilify and carbamazepine. His Abilify dose was adjusted to 15 mg down from 20 mg   OA knee: he was given knee braces by ortho but he does not seem to be wearing a brace consistently, he has a mild antalgic gait Unchanged  Rash: noticed rash on right arm, erythematous , does not seem to itchy, macular papular, we will try topical steroids    Patient Active Problem List   Diagnosis Date Noted   Primary osteoarthritis of both  knees 01/16/2022   B12 deficiency 01/16/2022   Moderate protein-calorie malnutrition ([place]) 01/16/2022   Bilateral groin pain 12/10/2021   Abnormal CT scan, stomach    Polyp of colon    Osteoarthritis of knee 03/26/2020   Varicose veins, lower extremity, with inflammation, ulcerated, unspecified laterality ([place]) 05/26/2017   Bilateral lower extremity edema 04/14/2017   Thrombocytopenia ([place]) 07/20/2016   Intellectual disability 03/17/2016   Polypharmacy 01/10/2016   Benign prostatic hypertrophy without urinary obstruction 01/09/2015   Chronic kidney disease (CKD), stage I 01/09/2015   Cognitive decline 01/09/2015   History of diabetes mellitus 01/09/2015   GERD without esophagitis 01/09/2015   Hearing loss 01/09/2015   Dyslipidemia 01/09/2015   Morbid obesity, unspecified obesity type ([place]) 01/09/2015   Obstructive sleep apnea 01/09/2015   Other specified causes of urethral stricture 01/09/2015   Hernia of anterior abdominal wall 01/09/2015   BPH with obstruction/lower urinary tract symptoms 07/09/2012   Incomplete bladder emptying 07/09/2012   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   Benign essential HTN 02/04/2007   Bipolar I disorder, single manic episode, moderate ([place]) 02/04/2007    Past Surgical History:  Procedure Laterality Date   COLONOSCOPY WITH PROPOFOL N/A 11/19/2021   Procedure: COLONOSCOPY  WITH PROPOFOL;  Surgeon: Lin Landsman, MD;  Location: [facility] ENDOSCOPY;  Service: Gastroenterology;  Laterality: N/A;   ESOPHAGOGASTRODUODENOSCOPY (EGD) WITH PROPOFOL N/A 11/19/2021   Procedure: ESOPHAGOGASTRODUODENOSCOPY (EGD) WITH PROPOFOL;  Surgeon: Lin Landsman, MD;  Location: [facility] ENDOSCOPY;  Service: Gastroenterology;  Laterality: N/A;   LAPAROSCOPIC CHOLECYSTECTOMY     LAPAROSCOPY  12/03/2010   VENTRAL HERNIA REPAIR      Family History  Family history unknown: Yes    Social History   Tobacco Use   Smoking status: Never   Smokeless tobacco: Never  Substance  Use Topics   Alcohol use: No    Alcohol/week: 0.0 standard drinks of alcohol     Current Outpatient Medications:    ABILIFY 15 MG tablet, Take 15 mg by mouth daily., Disp: , Rfl:    acetaminophen (TYLENOL) 500 MG tablet, Take 500 mg by mouth 3 (three) times daily., Disp: , Rfl:    antiseptic oral rinse (BIOTENE) LIQD, 15 mLs by Mouth Rinse route as needed for dry mouth., Disp: , Rfl:    atorvastatin (LIPITOR) 10 MG tablet, TAKE 1 TABLET BY MOUTH ONCE DAILY, Disp: 7 tablet, Rfl: 11   carbamazepine (EQUETRO) 200 MG CP12 12 hr capsule, Take 200 mg by mouth 2 (two) times daily., Disp: , Rfl:    carbamide peroxide (DEBROX) 6.5 % OTIC solution, PLACE 5 DROPS INTO BOTH EARS TWO TIMES A DAY, Disp: 15 mL, Rfl: 0   Cholecalciferol (VITAMIN D3) 1.25 MG (50000 UT) CAPS, Take 1 capsule by mouth every 14 (fourteen) days., Disp: , Rfl:    clonazePAM (KLONOPIN) 1 MG tablet, Take 1 mg by mouth daily., Disp: , Rfl:    Cyanocobalamin 1000 MCG/15ML LIQD, Take 5 mLs (333.3333 mcg total) by mouth daily., Disp: 450 mL, Rfl: 5   DENTA 5000 PLUS 1.1 % CREA dental cream, Take by mouth., Disp: , Rfl:    DULoxetine (CYMBALTA) 60 MG capsule, Take 60 mg by mouth daily., Disp: , Rfl:    Elastic Bandages & Supports (MEDICAL COMPRESSION SOCKS) MISC, 2 each by Does not apply route daily. Apply in am's and remove it at bedtime, Disp: 2 each, Rfl: 1   famotidine (PEPCID) 40 MG tablet, TAKE 1 TABLET BY MOUTH ONCE DAILY, Disp: 7 tablet, Rfl: 10   feeding supplement, ENSURE COMPLETE, (ENSURE COMPLETE) LIQD, Take 237 mLs by mouth 2 (two) times daily between meals., Disp: 237 mL, Rfl: 3   fexofenadine (ALLEGRA) 60 MG tablet, TAKE 1 TABLET BY MOUTH TWICE DAILY, Disp: 60 tablet, Rfl: 5   finasteride (PROSCAR) 5 MG tablet, Take 5 mg by mouth daily., Disp: , Rfl:    fluticasone (FLONASE) 50 MCG/ACT nasal spray, Place 2 sprays into both nostrils daily., Disp: 16 g, Rfl: 6   gabapentin (NEURONTIN) 300 MG capsule, Take 300 mg by mouth in  the morning., Disp: , Rfl:    ketoconazole (NIZORAL) 2 % cream, APPLY TOPICALLY 1 APPLICATION TO GROIN RASH ONCE DAILY, Disp: 60 g, Rfl: 11   Lubricants (K-Y JELLY) GEL, USE  AS NEEDED TO BE ABLE TO MASTURBATE, Disp: 57 g, Rfl: 10   meloxicam (MOBIC) 15 MG tablet, Take 1 tablet (15 mg total) by mouth daily as needed for pain. Please don't give it daily, Disp: 30 tablet, Rfl: 5   nystatin cream (MYCOSTATIN), APPLY TOPICALLY TWICE DAILY, Disp: 30 g, Rfl: 10   omega-3 acid ethyl esters (LOVAZA) 1 g capsule, TAKE 1 CAPSULE BY MOUTH 2 TIMES DAILY, Disp: 14 capsule, Rfl: 11  omeprazole (PRILOSEC) 20 MG capsule, TAKE 1 CAPSULE BY MOUTH ONCE DAILY *DO NOT CRUSH OR CHEW*, Disp: 30 capsule, Rfl: 11   ondansetron (ZOFRAN) 4 MG tablet, TAKE 1 TABLET BY MOUTH EVERY EIGHT HOURS AS NEEDED FOR NAUSEA / VOMITING, Disp: 30 tablet, Rfl: 11   Polyethyl Glycol-Propyl Glycol (SYSTANE) 0.4-0.3 % SOLN, Apply to eye., Disp: , Rfl:    tamsulosin (FLOMAX) 0.4 MG CAPS capsule, Take 1 capsule (0.4 mg total) by mouth daily., Disp: 30 capsule, Rfl: 12   Vitamin D, Ergocalciferol, (DRISDOL) 1.25 MG (50000 UNIT) CAPS capsule, Take 50,000 Units by mouth every 14 (fourteen) days., Disp: , Rfl:    Vitamins A & D (A+D PREVENT) OINT ointment, APPLY TOPICALLY AS NEEDED FOR DRY SKIN, Disp: 113 g, Rfl: 11   gabapentin (NEURONTIN) 600 MG tablet, Take 600 mg by mouth at bedtime. (Patient not taking: Reported on 04/08/2022), Disp: , Rfl:    lisinopril (ZESTRIL) 5 MG tablet, Take 2 tablets (10 mg total) by mouth daily., Disp: 90 tablet, Rfl: 1  No Known Allergies  I personally reviewed active problem list, medication list, allergies, family history, social history, health maintenance with the patient/caregiver today.   ROS  Constitutional: Negative for fever or weight change.  Respiratory: Negative for cough and shortness of breath.   Cardiovascular: Negative for chest pain or palpitations.  Gastrointestinal: Negative for abdominal  pain, no bowel changes.  Musculoskeletal: Negative for gait problem or joint swelling.  Skin: positive  for rash.  Neurological: Negative for dizziness or headache.  No other specific complaints in a complete review of systems (except as listed in HPI above).   Objective  Vitals:   04/08/22 1343  BP: 118/76  Pulse: 72  Resp: 18  Temp: 97.9 F (36.6 C)  TempSrc: Oral  SpO2: 98%  Weight: 208 lb 14.4 oz (94.8 kg)  Height: '6\' 4"'$  (1.93 m)    Body mass index is 25.43 kg/m.  Physical Exam  Constitutional: Patient appears well-developed and malnourished  No distress.  HEENT: head atraumatic, normocephalic, pupils equal and reactive to light,neck supple Cardiovascular: Normal rate, regular rhythm and normal heart sounds.  No murmur heard. No BLE edema. GU: scrotum erythematous, penis normal  Pulmonary/Chest: Effort normal and breath sounds normal. No respiratory distress. Abdominal: Soft.  There is no tenderness. Psychiatric: cooperative   PHQ2/9:    04/08/2022    1:47 PM 01/16/2022   10:21 AM 10/03/2021    9:53 AM 09/13/2021    8:24 AM 09/04/2021    9:16 AM  Depression screen PHQ 2/9  Decreased Interest 0 1 0 0 1  Down, Depressed, Hopeless 0 0 0 0 0  PHQ - 2 Score 0 1 0 0 1  Altered sleeping 0  0 0 0  Tired, decreased energy 0  0 0 0  Change in appetite 0  0 0 0  Feeling bad or failure about yourself  0  0 0 0  Trouble concentrating 0  0 0 0  Moving slowly or fidgety/restless 0  0 0 0  Suicidal thoughts 0  0 0 0  PHQ-9 Score 0  0 0 1  Difficult doing work/chores   Not difficult at all  Not difficult at all    phq 9 is negative   Fall Risk:    04/08/2022    1:47 PM 01/16/2022   10:21 AM 10/03/2021    9:52 AM 09/13/2021    8:24 AM 09/04/2021    9:16 AM  Fall Risk  Falls in the past year? 0 0 0 0 0  Number falls in past yr:  0 0 0 0  Injury with Fall?  0 0 0 0  Risk for fall due to : No Fall Risks No Fall Risks No Fall Risks No Fall Risks   Follow up Falls prevention  discussed;Education provided;Falls evaluation completed Falls prevention discussed Falls prevention discussed Falls prevention discussed Falls evaluation completed      Functional Status Survey: Is the patient deaf or have difficulty hearing?: No Does the patient have difficulty seeing, even when wearing glasses/contacts?: No Does the patient have difficulty concentrating, remembering, or making decisions?: No Does the patient have difficulty walking or climbing stairs?: Yes Does the patient have difficulty dressing or bathing?: No Does the patient have difficulty doing errands alone such as visiting a doctor's office or shopping?: Yes    Assessment & Plan  1. Rash in adult  - triamcinolone cream (KENALOG) 0.1 %; Apply 1 Application topically 2 (two) times daily. Use for 7 days and follow up if no resolution - right arm  Dispense: 30 g; Refill: 0  2. Benign essential HTN  BP is low, we will decrease dose of lisinopril from 10 mg to 5 mg daily  - lisinopril (ZESTRIL) 5 MG tablet; Take 1 tablet (5 mg total) by mouth daily.  Dispense: 90 tablet; Refill: 1  3. B12 deficiency   4. Dyslipidemia  Continue medication  5. BPH with obstruction/lower urinary tract symptoms   6. Primary osteoarthritis of both knees  Stable   7. GERD without esophagitis   8. Needs flu shot  - Flu Vaccine QUAD 6+ mos PF IM (Fluarix Quad PF)

## 2022-04-08 ENCOUNTER — Encounter: Payer: Self-pay | Admitting: Family Medicine

## 2022-04-08 ENCOUNTER — Ambulatory Visit (INDEPENDENT_AMBULATORY_CARE_PROVIDER_SITE_OTHER): Payer: Medicare Other | Admitting: Family Medicine

## 2022-04-08 VITALS — BP 118/76 | HR 72 | Temp 97.9°F | Resp 18 | Ht 76.0 in | Wt 208.9 lb

## 2022-04-08 DIAGNOSIS — I10 Essential (primary) hypertension: Secondary | ICD-10-CM | POA: Diagnosis not present

## 2022-04-08 DIAGNOSIS — M170 Bilateral primary osteoarthritis of knee: Secondary | ICD-10-CM

## 2022-04-08 DIAGNOSIS — K219 Gastro-esophageal reflux disease without esophagitis: Secondary | ICD-10-CM

## 2022-04-08 DIAGNOSIS — N401 Enlarged prostate with lower urinary tract symptoms: Secondary | ICD-10-CM | POA: Diagnosis not present

## 2022-04-08 DIAGNOSIS — N138 Other obstructive and reflux uropathy: Secondary | ICD-10-CM

## 2022-04-08 DIAGNOSIS — Z23 Encounter for immunization: Secondary | ICD-10-CM

## 2022-04-08 DIAGNOSIS — D696 Thrombocytopenia, unspecified: Secondary | ICD-10-CM

## 2022-04-08 DIAGNOSIS — E538 Deficiency of other specified B group vitamins: Secondary | ICD-10-CM

## 2022-04-08 DIAGNOSIS — R21 Rash and other nonspecific skin eruption: Secondary | ICD-10-CM | POA: Diagnosis not present

## 2022-04-08 DIAGNOSIS — E785 Hyperlipidemia, unspecified: Secondary | ICD-10-CM

## 2022-04-08 MED ORDER — TRIAMCINOLONE ACETONIDE 0.1 % EX CREA: 1.0000 | TOPICAL_CREAM | Freq: Two times a day (BID) | CUTANEOUS | 0 refills | Status: DC

## 2022-04-08 MED ORDER — LISINOPRIL 5 MG PO TABS: 10.0000 mg | ORAL_TABLET | Freq: Every day | ORAL | 1 refills | Status: DC

## 2022-04-08 MED ORDER — LISINOPRIL 5 MG PO TABS: 5.0000 mg | ORAL_TABLET | Freq: Every day | ORAL | 1 refills | Status: DC

## 2022-04-09 LAB — COMPLETE METABOLIC PANEL WITH GFR
AG Ratio: 2.1 (calc) (ref 1.0–2.5)
ALT: 15 U/L (ref 9–46)
AST: 14 U/L (ref 10–35)
Albumin: 4 g/dL (ref 3.6–5.1)
Alkaline phosphatase (APISO): 62 U/L (ref 35–144)
BUN: 11 mg/dL (ref 7–25)
CO2: 28 mmol/L (ref 20–32)
Calcium: 8.6 mg/dL (ref 8.6–10.3)
Chloride: 97 mmol/L — ABNORMAL LOW (ref 98–110)
Creat: 0.78 mg/dL (ref 0.70–1.35)
Globulin: 1.9 g/dL (calc) (ref 1.9–3.7)
Glucose, Bld: 113 mg/dL — ABNORMAL HIGH (ref 65–99)
Potassium: 4.5 mmol/L (ref 3.5–5.3)
Sodium: 133 mmol/L — ABNORMAL LOW (ref 135–146)
Total Bilirubin: 0.4 mg/dL (ref 0.2–1.2)
Total Protein: 5.9 g/dL — ABNORMAL LOW (ref 6.1–8.1)
eGFR: 101 mL/min/{1.73_m2} (ref >=60)

## 2022-04-09 LAB — LIPID PANEL
Cholesterol: 130 mg/dL (ref <200)
HDL: 70 mg/dL (ref >=40)
LDL Cholesterol (Calc): 51 mg/dL (calc)
Non-HDL Cholesterol (Calc): 60 mg/dL (calc) (ref <130)
Total CHOL/HDL Ratio: 1.9 (calc) (ref <5.0)
Triglycerides: 32 mg/dL (ref <150)

## 2022-04-09 LAB — VITAMIN B12: Vitamin B-12: 646 pg/mL (ref 200–1100)

## 2022-05-22 ENCOUNTER — Encounter: Payer: Self-pay | Admitting: Podiatry

## 2022-05-22 ENCOUNTER — Ambulatory Visit (INDEPENDENT_AMBULATORY_CARE_PROVIDER_SITE_OTHER): Payer: Medicare Other | Admitting: Podiatry

## 2022-05-22 DIAGNOSIS — M79674 Pain in right toe(s): Secondary | ICD-10-CM | POA: Diagnosis not present

## 2022-05-22 DIAGNOSIS — M79675 Pain in left toe(s): Secondary | ICD-10-CM | POA: Diagnosis not present

## 2022-05-22 DIAGNOSIS — E119 Type 2 diabetes mellitus without complications: Secondary | ICD-10-CM | POA: Diagnosis not present

## 2022-05-22 DIAGNOSIS — B351 Tinea unguium: Secondary | ICD-10-CM

## 2022-05-22 NOTE — Progress Notes (Signed)
This patient returns to my office for at risk foot care.  This patient requires this care by a professional since this patient will be at risk due to having diabetes and CKD..  This patient is unable to cut nails himself since the patient cannot reach his nails.These nails are painful walking and wearing shoes.  This patient presents for at risk foot care today.  General Appearance  Alert, conversant and in no acute stress.  Vascular  Dorsalis pedis and posterior tibial  pulses are palpable  bilaterally.  Capillary return is within normal limits  bilaterally. Temperature is within normal limits  bilaterally.  Neurologic  Senn-Weinstein monofilament wire test within normal limits  bilaterally. Muscle power within normal limits bilaterally.  Nails Thick disfigured discolored nails with subungual debris  from second  to fifth toes bilaterally. No evidence of bacterial infection or drainage bilaterally.  Orthopedic  No limitations of motion  feet .  No crepitus or effusions noted.  No bony pathology or digital deformities noted.  Skin  normotropic skin with no porokeratosis noted bilaterally.  No signs of infections or ulcers noted.     Onychomycosis  Pain in right toes  Pain in left toes  Consent was obtained for treatment procedures.   Mechanical debridement of nails 2-5  bilaterally performed with a nail nipper.  Filed with dremel without incident.    Return office visit   6 months                   Told patient to return for periodic foot care and evaluation due to potential at risk complications.   Gardiner Barefoot DPM

## 2022-05-23 ENCOUNTER — Ambulatory Visit: Payer: Medicare Other | Admitting: Family Medicine

## 2022-06-02 ENCOUNTER — Telehealth: Payer: Self-pay | Admitting: Family Medicine

## 2022-06-02 NOTE — Telephone Encounter (Signed)
Wayne Jones with [facility] is needing order of powder for nystatin cream (MYCOSTATIN) [[id number]] for scrotum irration. Never received the escript. Please advise   [facility] [place], [place] [address] [address]  Phone: [phone] Fax: [phone]  Hours: Not open 24 hours

## 2022-06-03 ENCOUNTER — Other Ambulatory Visit: Payer: Self-pay

## 2022-06-03 DIAGNOSIS — N481 Balanitis: Secondary | ICD-10-CM

## 2022-06-03 MED ORDER — NYSTATIN 100000 UNIT/GM EX CREA: TOPICAL_CREAM | Freq: Two times a day (BID) | CUTANEOUS | 10 refills | Status: DC

## 2022-06-06 ENCOUNTER — Other Ambulatory Visit: Payer: Self-pay

## 2022-06-06 DIAGNOSIS — R21 Rash and other nonspecific skin eruption: Secondary | ICD-10-CM

## 2022-06-06 MED ORDER — NYSTATIN 100000 UNIT/GM EX POWD: CUTANEOUS | Status: DC | PRN

## 2022-07-10 ENCOUNTER — Ambulatory Visit (INDEPENDENT_AMBULATORY_CARE_PROVIDER_SITE_OTHER): Payer: Medicare Other

## 2022-07-10 VITALS — Wt 208.0 lb

## 2022-07-10 DIAGNOSIS — Z0000 Encounter for general adult medical examination without abnormal findings: Secondary | ICD-10-CM

## 2022-07-10 NOTE — Progress Notes (Signed)
Subjective:  I connected with  Wayne Jones via caregiver on 07/10/22 by a audio enabled telemedicine application and verified that I am speaking with the correct person using two identifiers.  Group Home   Provider Location: Office/Clinic  I discussed the limitations of evaluation and management by telemedicine. The patient expressed understanding and agreed to proceed.  Wayne Jones is a 61 y.o. male who presents for Medicare Annual/Subsequent preventive examination.  Review of Systems    Defer to PCP       Objective:    Today's Vitals   07/10/22 1104  Weight: 208 lb (94.3 kg)   Body mass index is 25.32 kg/m.     11/19/2021   10:34 AM 10/10/2021   10:32 AM 12/17/2020   11:20 AM 12/17/2020   11:15 AM 06/21/2020    3:13 PM 05/08/2020    1:52 PM 05/26/2017    2:17 PM  Advanced Directives  Does Patient Have a Medical Advance Directive? No No No Unable to assess, patient is non-responsive or altered mental status Unable to assess, patient is non-responsive or altered mental status No Yes  Type of Advance Directive       Healthcare Power of Attorney    Current Medications (verified) Outpatient Encounter Medications as of 07/10/2022  Medication Sig   ABILIFY 15 MG tablet Take 15 mg by mouth daily.   acetaminophen (TYLENOL) 500 MG tablet Take 500 mg by mouth 3 (three) times daily.   antiseptic oral rinse (BIOTENE) LIQD 15 mLs by Mouth Rinse route as needed for dry mouth.   atorvastatin (LIPITOR) 10 MG tablet TAKE 1 TABLET BY MOUTH ONCE DAILY   carbamazepine (EQUETRO) 200 MG CP12 12 hr capsule Take 200 mg by mouth 2 (two) times daily.   carbamide peroxide (DEBROX) 6.5 % OTIC solution PLACE 5 DROPS INTO BOTH EARS TWO TIMES A DAY   Cholecalciferol (VITAMIN D3) 1.25 MG (50000 UT) CAPS Take 1 capsule by mouth every 14 (fourteen) days.   clonazePAM (KLONOPIN) 1 MG tablet Take 1 mg by mouth daily.   Cyanocobalamin 1000 MCG/15ML LIQD Take 5 mLs (333.3333 mcg total) by mouth  daily.   DENTA 5000 PLUS 1.1 % CREA dental cream Take by mouth.   DULoxetine (CYMBALTA) 60 MG capsule Take 60 mg by mouth daily.   Elastic Bandages & Supports (MEDICAL COMPRESSION SOCKS) MISC 2 each by Does not apply route daily. Apply in am's and remove it at bedtime   famotidine (PEPCID) 40 MG tablet TAKE 1 TABLET BY MOUTH ONCE DAILY   feeding supplement, ENSURE COMPLETE, (ENSURE COMPLETE) LIQD Take 237 mLs by mouth 2 (two) times daily between meals.   fexofenadine (ALLEGRA) 60 MG tablet TAKE 1 TABLET BY MOUTH TWICE DAILY   finasteride (PROSCAR) 5 MG tablet Take 5 mg by mouth daily.   fluticasone (FLONASE) 50 MCG/ACT nasal spray Place 2 sprays into both nostrils daily.   gabapentin (NEURONTIN) 300 MG capsule Take 300 mg by mouth in the morning.   gabapentin (NEURONTIN) 600 MG tablet Take 600 mg by mouth at bedtime.   ketoconazole (NIZORAL) 2 % cream APPLY TOPICALLY 1 APPLICATION TO GROIN RASH ONCE DAILY   lisinopril (ZESTRIL) 5 MG tablet Take 1 tablet (5 mg total) by mouth daily.   Lubricants (K-Y JELLY) GEL USE  AS NEEDED TO BE ABLE TO MASTURBATE   meloxicam (MOBIC) 15 MG tablet Take 1 tablet (15 mg total) by mouth daily as needed for pain. Please don't give it daily  nystatin cream (MYCOSTATIN) Apply topically 2 (two) times daily.   omega-3 acid ethyl esters (LOVAZA) 1 g capsule TAKE 1 CAPSULE BY MOUTH 2 TIMES DAILY   omeprazole (PRILOSEC) 20 MG capsule TAKE 1 CAPSULE BY MOUTH ONCE DAILY *DO NOT CRUSH OR CHEW*   ondansetron (ZOFRAN) 4 MG tablet TAKE 1 TABLET BY MOUTH EVERY EIGHT HOURS AS NEEDED FOR NAUSEA / VOMITING   Polyethyl Glycol-Propyl Glycol (SYSTANE) 0.4-0.3 % SOLN Apply to eye.   tamsulosin (FLOMAX) 0.4 MG CAPS capsule Take 1 capsule (0.4 mg total) by mouth daily.   triamcinolone cream (KENALOG) 0.1 % Apply 1 Application topically 2 (two) times daily. Use for 7 days and follow up if no resolution - right arm   Vitamin D, Ergocalciferol, (DRISDOL) 1.25 MG (50000 UNIT) CAPS capsule  Take 50,000 Units by mouth every 14 (fourteen) days.   Vitamins A & D (A+D PREVENT) OINT ointment APPLY TOPICALLY AS NEEDED FOR DRY SKIN   Facility-Administered Encounter Medications as of 07/10/2022  Medication   nystatin (MYCOSTATIN/NYSTOP) topical powder    Allergies (verified) Patient has no known allergies.   History: Past Medical History:  Diagnosis Date   Depression    Diabetes mellitus without complication ([place])    Hyperlipidemia    Hypertension    Mental disorder    Past Surgical History:  Procedure Laterality Date   COLONOSCOPY WITH PROPOFOL N/A 11/19/2021   Procedure: COLONOSCOPY WITH PROPOFOL;  Surgeon: Lin Landsman, MD;  Location: [facility] ENDOSCOPY;  Service: Gastroenterology;  Laterality: N/A;   ESOPHAGOGASTRODUODENOSCOPY (EGD) WITH PROPOFOL N/A 11/19/2021   Procedure: ESOPHAGOGASTRODUODENOSCOPY (EGD) WITH PROPOFOL;  Surgeon: Lin Landsman, MD;  Location: [facility] ENDOSCOPY;  Service: Gastroenterology;  Laterality: N/A;   LAPAROSCOPIC CHOLECYSTECTOMY     LAPAROSCOPY  12/03/2010   VENTRAL HERNIA REPAIR     Family History  Family history unknown: Yes   Social History   Socioeconomic History   Marital status: Single    Spouse name: Not on file   Number of children: Not on file   Years of education: Not on file   Highest education level: Not on file  Occupational History   Occupation: disabled    Comment: Patient in care home  Tobacco Use   Smoking status: Never   Smokeless tobacco: Never  Vaping Use   Vaping Use: Never used  Substance and Sexual Activity   Alcohol use: No    Alcohol/week: 0.0 standard drinks of alcohol   Drug use: No   Sexual activity: Never  Other Topics Concern   Not on file  Social History Narrative   Wayne Jones calls him twice weekly. Pt resides at [facility] group home at [employer]    Social Determinants of Health   Financial Resource Strain: Low Risk  (07/10/2022)   Overall Financial Resource Strain (CARDIA)     Difficulty of Paying Living Expenses: Not hard at all  Food Insecurity: No Food Insecurity (07/10/2022)   Hunger Vital Sign    Worried About Running Out of Food in the Last Year: Never true    Ran Out of Food in the Last Year: Never true  Transportation Needs: No Transportation Needs (07/10/2022)   PRAPARE - Hydrologist (Medical): No    Lack of Transportation (Non-Medical): No  Physical Activity: Sufficiently Active (07/10/2022)   Exercise Vital Sign    Days of Exercise per Week: 5 days    Minutes of Exercise per Session: 30 min  Stress: No Stress  Concern Present (07/10/2022)   [place]    Feeling of Stress : Not at all  Social Connections: Socially Isolated (07/10/2022)   Social Connection and Isolation Panel [NHANES]    Frequency of Communication with Friends and Family: More than three times a week    Frequency of Social Gatherings with Friends and Family: Once a week    Attends Religious Services: Never    Marine scientist or Organizations: No    Attends Archivist Meetings: Never    Marital Status: Never married    Tobacco Counseling Counseling given: Not Answered   Clinical Intake:                 Diabetic?No         Activities of Daily Living    07/10/2022   11:09 AM 04/08/2022    1:47 PM  In your present state of health, do you have any difficulty performing the following activities:  Hearing? 0 0  Vision? 0 0  Difficulty concentrating or making decisions? 1 0  Walking or climbing stairs? 0 1  Dressing or bathing? 0 0  Doing errands, shopping? 1 1    Patient Care Team: Steele Sizer, MD as PCP - General (Family Medicine) Alvy Bimler, MD as Referring Physician (Psychiatry) Gardiner Barefoot, DPM as Consulting Physician (Podiatry) Delfin Edis, MD as Referring Physician (Urology) Earlie Server, MD as Consulting Physician  (Hematology) Kate Sable, MD as Consulting Physician (Cardiology)  Indicate any recent Medical Services you may have received from other than [facility] providers in the past year (date may be approximate).     Assessment:   This is a routine wellness examination for Wayne Jones.  Hearing/Vision screen No results found.  Dietary issues and exercise activities discussed:     Goals Addressed   None   Depression Screen    07/10/2022   11:05 AM 04/08/2022    1:47 PM 01/16/2022   10:21 AM 10/03/2021    9:53 AM 09/13/2021    8:24 AM 09/04/2021    9:16 AM 07/09/2021    2:31 PM  PHQ 2/9 Scores  PHQ - 2 Score 0 0 1 0 0 1 0  PHQ- 9 Score 0 0  0 0 1     Fall Risk    07/10/2022   11:08 AM 04/08/2022    1:47 PM 01/16/2022   10:21 AM 10/03/2021    9:52 AM 09/13/2021    8:24 AM  Fall Risk   Falls in the past year? 0 0 0 0 0  Number falls in past yr:   0 0 0  Injury with Fall?   0 0 0  Risk for fall due to : No Fall Risks No Fall Risks No Fall Risks No Fall Risks No Fall Risks  Follow up Education provided;Falls prevention discussed Falls prevention discussed;Education provided;Falls evaluation completed Falls prevention discussed Falls prevention discussed Falls prevention discussed    FALL RISK PREVENTION PERTAINING TO THE HOME:  Any stairs in or around the home? No  If so, are there any without handrails? No  Home free of loose throw rugs in walkways, pet beds, electrical cords, etc? Yes  Adequate lighting in your home to reduce risk of falls? Yes   ASSISTIVE DEVICES UTILIZED TO PREVENT FALLS:  Life alert? No  Use of a cane, walker or w/c? No  Grab bars in the bathroom? Yes  Shower chair or bench in shower?  Yes  Elevated toilet seat or a handicapped toilet? Yes   TIMED UP AND GO:  Was the test performed? No .  Length of time to ambulate 10 feet: n/a sec.     Cognitive Function:        07/10/2022   11:07 AM  6CIT Screen  What Year? 4 points  What month? 3 points  What time?  3 points  Count back from 20 4 points  Months in reverse 4 points  Repeat phrase 0 points  Total Score 18 points    Immunizations Immunization History  Administered Date(s) Administered   Influenza, Seasonal, Injecte, Preservative Fre 05/30/2010, 05/28/2011   Influenza,inj,Quad PF,6+ Mos 04/13/2013, 04/24/2014, 03/14/2015, 06/13/2016, 05/20/2017, 03/26/2018, 06/04/2018, 04/20/2019, 04/03/2020, 04/04/2021, 04/08/2022   Influenza,inj,quad, With Preservative 05/05/2019   Moderna Covid-19 Vaccine Bivalent Booster 39yr & up 06/11/2021   Moderna Sars-Covid-2 Vaccination 09/07/2019, 10/06/2019, 06/11/2020, 01/16/2021   Pneumococcal Conjugate-13 05/25/2017   Pneumococcal Polysaccharide-23 03/31/2012, 03/17/2016   Tdap 03/17/2016, 10/05/2016, 08/13/2017   Zoster Recombinat (Shingrix) 12/21/2021    TDAP status: Up to date  Flu Vaccine status: Up to date  Pneumococcal vaccine status: Due, Education has been provided regarding the importance of this vaccine. Advised may receive this vaccine at local pharmacy or Health Dept. Aware to provide a copy of the vaccination record if obtained from local pharmacy or Health Dept. Verbalized acceptance and understanding.  Covid-19 vaccine status: Completed vaccines  Qualifies for Shingles Vaccine? Yes   Zostavax completed No   Shingrix Completed?: No.    Education has been provided regarding the importance of this vaccine. Patient has been advised to call insurance company to determine out of pocket expense if they have not yet received this vaccine. Advised may also receive vaccine at local pharmacy or Health Dept. Verbalized acceptance and understanding.  Screening Tests Health Maintenance  Topic Date Due   OPHTHALMOLOGY EXAM  07/10/2022 (Originally 07/08/2022)   Diabetic kidney evaluation - Urine ACR  07/11/2022 (Originally 09/28/2019)   COVID-19 Vaccine (6 - 2023-24 season) 07/26/2022 (Originally 04/04/2022)   Zoster Vaccines- Shingrix (2 of 2)  10/09/2022 (Originally 02/15/2022)   HIV Screening  04/09/2023 (Originally 10/17/1975)   Diabetic kidney evaluation - GFR measurement  04/09/2023   Medicare Annual Wellness (AWV)  07/11/2023   Fecal DNA (Cologuard)  07/15/2024   DTaP/Tdap/Td (4 - Td or Tdap) 08/14/2027   INFLUENZA VACCINE  Completed   Hepatitis C Screening  Completed   HPV VACCINES  Aged Out    Health Maintenance  There are no preventive care reminders to display for this patient.   Colorectal cancer screening: Type of screening: Cologuard. Completed 07/15/2021. Repeat every 3 years  Lung Cancer Screening: (Low Dose CT Chest recommended if Age 61-80years, 30 pack-year currently smoking OR have quit w/in 15years.) does not qualify.   Lung Cancer Screening Referral: n/a  Additional Screening:  Hepatitis C Screening: does not qualify; Completed 12/04/2020  Vision Screening: Recommended annual ophthalmology exams for early detection of glaucoma and other disorders of the eye. Is the patient up to date with their annual eye exam?  No  Who is the provider or what is the name of the office in which the patient attends annual eye exams? N/a If pt is not established with a provider, would they like to be referred to a provider to establish care? No .   Dental Screening: Recommended annual dental exams for proper oral hygiene  Community Resource Referral / Chronic Care Management: CRR required this visit?  No   CCM required this visit?  No      Plan:     I have personally reviewed and noted the following in the patient's chart:   Medical and social history Use of alcohol, tobacco or illicit drugs  Current medications and supplements including opioid prescriptions. Patient is not currently taking opioid prescriptions. Functional ability and status Nutritional status Physical activity Advanced directives List of other physicians Hospitalizations, surgeries, and ER visits in previous 12  months Vitals Screenings to include cognitive, depression, and falls Referrals and appointments  In addition, I have reviewed and discussed with patient certain preventive protocols, quality metrics, and best practice recommendations. A written personalized care plan for preventive services as well as general preventive health recommendations were provided to patient.     Wayne Jones, [place]   07/10/2022   Nurse Notes:  Wayne Jones , Thank you for taking time to come for your Medicare Wellness Visit. I appreciate your ongoing commitment to your health goals. Please review the following plan we discussed and let me know if I can assist you in the future.   These are the goals we discussed:  Goals      DIET - EAT MORE FRUITS AND VEGETABLES     Exercise 3x per week (30 min per time)        This is a list of the screening recommended for you and due dates:  Health Maintenance  Topic Date Due   Eye exam for diabetics  07/10/2022*   Yearly kidney health urinalysis for diabetes  07/11/2022*   COVID-19 Vaccine (6 - 2023-24 season) 07/26/2022*   Zoster (Shingles) Vaccine (2 of 2) 10/09/2022*   HIV Screening  04/09/2023*   Yearly kidney function blood test for diabetes  04/09/2023   Medicare Annual Wellness Visit  07/11/2023   Cologuard (Stool DNA test)  07/15/2024   DTaP/Tdap/Td vaccine (4 - Td or Tdap) 08/14/2027   Flu Shot  Completed   Hepatitis C Screening: USPSTF Recommendation to screen - Ages 18-79 yo.  Completed   HPV Vaccine  Aged Out  *Topic was postponed. The date shown is not the original due date.

## 2022-08-08 NOTE — Progress Notes (Unsigned)
Name: Wayne Jones   MRN: [id number]    DOB: 1961-05-06   Date:08/11/2022       Progress Note  Subjective  Chief Complaint  Follow Up He came in today with Phillips Odor - he lives in a group home - [facility]   HPI  History of DM: his A1C has been normal for over one year without medication.   He is on statin therapy No symptoms of hypoglycemia. His weight is finally stable, last level normal and we will not recheck it otday    HTN: he is off beta blocker, only on low dose Lisinopril , he has lost a lot of weight but stable now  , bp is at goal today    BPH: he was  under the care of Dr. Veronda Prude in [facility] but was switched to Urologist at [facility] - Dr. Rona Ravens - he had two previous appointments but first time he refused to leave the group home and the second time he tried got out of the [place] when they stopped at a stop sign. He has another appointment made for Feb 28 th  he states no longer having nocturia, he still masturbates often and causes penile irritation, he is complaining of pain today .  He is on Proscar and Flomax, last PSA was normal Dec 2022 , we will recheck PSA today    Dyslipidemia: taking statin and Lovaza. LDL is at goal . Reviewed labs done 09/23 and reviewed with caregiver  Bipolar disorder: seeing psychiatrist - Dr Tamera Punt and currently off Prozac and titrating dose of Duloxetine. He is calm today.    Malnutrition: normal appetite Caregiver states he seems to have an normal appetite. His wait was in the 260 lbs back in 2019, 250 lbs in 2020's he was  to 231 lbs end of 2022 , down to 206 in 2023 and today is up to 210 lbs . He had EGD and colonoscopy that were unremarkable. Continue eating well and needs high protein intake. Low protein on labs , under the care of Dr. Tasia Catchings    Thrombocytopenia: seeing Dr. Tasia Catchings. Per her notes secondary to Abilify and carbamazepine. His Abilify dose is now 15 mg instead of 20 mg dose and will go back in March for follow up   OA knee: he was given  knee braces by ortho but he does not seem to be wearing a brace consistently, he has a mild antalgic gait . Unchanged    Patient Active Problem List   Diagnosis Date Noted   Primary osteoarthritis of both knees 01/16/2022   B12 deficiency 01/16/2022   Moderate protein-calorie malnutrition ([place]) 01/16/2022   Bilateral groin pain 12/10/2021   Abnormal CT scan, stomach    Polyp of colon    Osteoarthritis of knee 03/26/2020   Varicose veins, lower extremity, with inflammation, ulcerated, unspecified laterality ([place]) 05/26/2017   Bilateral lower extremity edema 04/14/2017   Thrombocytopenia ([place]) 07/20/2016   Intellectual disability 03/17/2016   Polypharmacy 01/10/2016   Benign prostatic hypertrophy without urinary obstruction 01/09/2015   Chronic kidney disease (CKD), stage I 01/09/2015   Cognitive decline 01/09/2015   History of diabetes mellitus 01/09/2015   GERD without esophagitis 01/09/2015   Hearing loss 01/09/2015   Dyslipidemia 01/09/2015   Morbid obesity, unspecified obesity type ([place]) 01/09/2015   Obstructive sleep apnea 01/09/2015   Other specified causes of urethral stricture 01/09/2015   Hernia of anterior abdominal wall 01/09/2015   BPH with obstruction/lower urinary tract symptoms 07/09/2012  Incomplete bladder emptying 07/09/2012   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   Benign essential HTN 02/04/2007   Bipolar I disorder, single manic episode, moderate ([place]) 02/04/2007    Past Surgical History:  Procedure Laterality Date   COLONOSCOPY WITH PROPOFOL N/A 11/19/2021   Procedure: COLONOSCOPY WITH PROPOFOL;  Surgeon: Lin Landsman, MD;  Location: [facility];  Service: Gastroenterology;  Laterality: N/A;   ESOPHAGOGASTRODUODENOSCOPY (EGD) WITH PROPOFOL N/A 11/19/2021   Procedure: ESOPHAGOGASTRODUODENOSCOPY (EGD) WITH PROPOFOL;  Surgeon: Lin Landsman, MD;  Location: [facility];  Service: Gastroenterology;  Laterality: N/A;   LAPAROSCOPIC CHOLECYSTECTOMY      LAPAROSCOPY  12/03/2010   VENTRAL HERNIA REPAIR      Family History  Family history unknown: Yes    Social History   Tobacco Use   Smoking status: Never   Smokeless tobacco: Never  Substance Use Topics   Alcohol use: No    Alcohol/week: 0.0 standard drinks of alcohol     Current Outpatient Medications:    ABILIFY 15 MG tablet, Take 15 mg by mouth daily., Disp: , Rfl:    acetaminophen (TYLENOL) 500 MG tablet, Take 500 mg by mouth 3 (three) times daily., Disp: , Rfl:    antiseptic oral rinse (BIOTENE) LIQD, 15 mLs by Mouth Rinse route as needed for dry mouth., Disp: , Rfl:    atorvastatin (LIPITOR) 10 MG tablet, TAKE 1 TABLET BY MOUTH ONCE DAILY, Disp: 7 tablet, Rfl: 11   carbamazepine (EQUETRO) 200 MG CP12 12 hr capsule, Take 200 mg by mouth 2 (two) times daily., Disp: , Rfl:    carbamide peroxide (DEBROX) 6.5 % OTIC solution, PLACE 5 DROPS INTO BOTH EARS TWO TIMES A DAY, Disp: 15 mL, Rfl: 0   Cholecalciferol (VITAMIN D3) 1.25 MG (50000 UT) CAPS, Take 1 capsule by mouth every 14 (fourteen) days., Disp: , Rfl:    clonazePAM (KLONOPIN) 1 MG tablet, Take 1 mg by mouth daily., Disp: , Rfl:    Cyanocobalamin 1000 MCG/15ML LIQD, Take 5 mLs (333.3333 mcg total) by mouth daily., Disp: 450 mL, Rfl: 5   DENTA 5000 PLUS 1.1 % CREA dental cream, Take by mouth., Disp: , Rfl:    DULoxetine (CYMBALTA) 60 MG capsule, Take 60 mg by mouth daily., Disp: , Rfl:    Elastic Bandages & Supports (MEDICAL COMPRESSION SOCKS) MISC, 2 each by Does not apply route daily. Apply in am's and remove it at bedtime, Disp: 2 each, Rfl: 1   famotidine (PEPCID) 40 MG tablet, TAKE 1 TABLET BY MOUTH ONCE DAILY, Disp: 7 tablet, Rfl: 10   feeding supplement, ENSURE COMPLETE, (ENSURE COMPLETE) LIQD, Take 237 mLs by mouth 2 (two) times daily between meals., Disp: 237 mL, Rfl: 3   fexofenadine (ALLEGRA) 60 MG tablet, TAKE 1 TABLET BY MOUTH TWICE DAILY, Disp: 60 tablet, Rfl: 5   finasteride (PROSCAR) 5 MG tablet, Take 5 mg  by mouth daily., Disp: , Rfl:    fluticasone (FLONASE) 50 MCG/ACT nasal spray, Place 2 sprays into both nostrils daily., Disp: 16 g, Rfl: 6   gabapentin (NEURONTIN) 300 MG capsule, Take 300 mg by mouth in the morning., Disp: , Rfl:    gabapentin (NEURONTIN) 600 MG tablet, Take 600 mg by mouth at bedtime., Disp: , Rfl:    ketoconazole (NIZORAL) 2 % cream, APPLY TOPICALLY 1 APPLICATION TO GROIN RASH ONCE DAILY, Disp: 60 g, Rfl: 11   lisinopril (ZESTRIL) 5 MG tablet, Take 1 tablet (5 mg total) by mouth daily., Disp: 90 tablet, Rfl: 1  Lubricants (K-Y JELLY) GEL, USE  AS NEEDED TO BE ABLE TO MASTURBATE, Disp: 57 g, Rfl: 10   meloxicam (MOBIC) 15 MG tablet, Take 1 tablet (15 mg total) by mouth daily as needed for pain. Please don't give it daily, Disp: 30 tablet, Rfl: 5   nystatin cream (MYCOSTATIN), Apply topically 2 (two) times daily., Disp: 30 g, Rfl: 10   omega-3 acid ethyl esters (LOVAZA) 1 g capsule, TAKE 1 CAPSULE BY MOUTH 2 TIMES DAILY, Disp: 14 capsule, Rfl: 11   omeprazole (PRILOSEC) 20 MG capsule, TAKE 1 CAPSULE BY MOUTH ONCE DAILY *DO NOT CRUSH OR CHEW*, Disp: 30 capsule, Rfl: 11   ondansetron (ZOFRAN) 4 MG tablet, TAKE 1 TABLET BY MOUTH EVERY EIGHT HOURS AS NEEDED FOR NAUSEA / VOMITING, Disp: 30 tablet, Rfl: 11   Polyethyl Glycol-Propyl Glycol (SYSTANE) 0.4-0.3 % SOLN, Apply to eye., Disp: , Rfl:    tamsulosin (FLOMAX) 0.4 MG CAPS capsule, Take 1 capsule (0.4 mg total) by mouth daily., Disp: 30 capsule, Rfl: 12   triamcinolone cream (KENALOG) 0.1 %, Apply 1 Application topically 2 (two) times daily. Use for 7 days and follow up if no resolution - right arm, Disp: 30 g, Rfl: 0   Vitamin D, Ergocalciferol, (DRISDOL) 1.25 MG (50000 UNIT) CAPS capsule, Take 50,000 Units by mouth every 14 (fourteen) days., Disp: , Rfl:    Vitamins A & D (A+D PREVENT) OINT ointment, APPLY TOPICALLY AS NEEDED FOR DRY SKIN, Disp: 113 g, Rfl: 11  Current Facility-Administered Medications:    nystatin  (MYCOSTATIN/NYSTOP) topical powder, , Topical, PRN, Steele Sizer, MD  No Known Allergies  I personally reviewed active problem list, medication list, allergies, family history, social history, health maintenance with the patient/caregiver today.   ROS  Ten systems reviewed and is negative except as mentioned in HPI   Objective  Vitals:   08/11/22 0831  BP: 122/70  Pulse: 77  Resp: 16  SpO2: 100%  Weight: 210 lb (95.3 kg)  Height: '6\' 3"'$  (1.905 m)    Body mass index is 26.25 kg/m.  Physical Exam  Constitutional: Patient appears well-developed and well-nourished.  No distress.  HEENT: head atraumatic, normocephalic, pupils equal and reactive to light, neck supple Cardiovascular: Normal rate, regular rhythm and normal heart sounds.  No murmur heard. No BLE edema. Pulmonary/Chest: Effort normal and breath sounds normal. No respiratory distress. Abdominal: Soft.  There is no tenderness. Urological: he still has some tenderness during retraction of foreskin , worse on posterior aspect, no rashes at this time Psychiatric: Patient has a normal mood and affect. behavior is normal. Cooperative    PHQ2/9:    08/11/2022    8:31 AM 07/10/2022   11:05 AM 04/08/2022    1:47 PM 01/16/2022   10:21 AM 10/03/2021    9:53 AM  Depression screen PHQ 2/9  Decreased Interest 0 0 0 1 0  Down, Depressed, Hopeless 0 0 0 0 0  PHQ - 2 Score 0 0 0 1 0  Altered sleeping 0 0 0  0  Tired, decreased energy 0 0 0  0  Change in appetite 0 0 0  0  Feeling bad or failure about yourself  0 0 0  0  Trouble concentrating 0 0 0  0  Moving slowly or fidgety/restless 0 0 0  0  Suicidal thoughts 0 0 0  0  PHQ-9 Score 0 0 0  0  Difficult doing work/chores     Not difficult at all  phq 9 is negative   Fall Risk:    08/11/2022    8:31 AM 07/10/2022   11:08 AM 04/08/2022    1:47 PM 01/16/2022   10:21 AM 10/03/2021    9:52 AM  Fall Risk   Falls in the past year? 0 0 0 0 0  Number falls in past yr: 0   0 0   Injury with Fall? 0   0 0  Risk for fall due to : No Fall Risks No Fall Risks No Fall Risks No Fall Risks No Fall Risks  Follow up Falls prevention discussed Education provided;Falls prevention discussed Falls prevention discussed;Education provided;Falls evaluation completed Falls prevention discussed Falls prevention discussed      Functional Status Survey: Is the patient deaf or have difficulty hearing?: No Does the patient have difficulty seeing, even when wearing glasses/contacts?: No Does the patient have difficulty concentrating, remembering, or making decisions?: Yes Does the patient have difficulty walking or climbing stairs?: No Does the patient have difficulty dressing or bathing?: No Does the patient have difficulty doing errands alone such as visiting a doctor's office or shopping?: Yes    Assessment & Plan  1. Thrombocytopenia ([place])  Keep follow up with Dr. Tasia Catchings  2. Bipolar I disorder, single manic episode, moderate ([place])  Keep follow up with Dr. Tamera Punt   3. GERD without esophagitis  Stable   4. B12 deficiency  Continue supplementation   5. Primary osteoarthritis of both knees   6. BPH with obstruction/lower urinary tract symptoms  - PSA  7. Cognitive decline  Lives in a group home   8. Intellectual disability   9. Pain, penile  Due to over masturbation   10. Masturbation  Sometimes does not use lotion  11. Low serum R protein  - COMPLETE METABOLIC PANEL WITH GFR  12. Long-term use of high-risk medication  - COMPLETE METABOLIC PANEL WITH GFR

## 2022-08-11 ENCOUNTER — Encounter: Payer: Self-pay | Admitting: Family Medicine

## 2022-08-11 ENCOUNTER — Ambulatory Visit (INDEPENDENT_AMBULATORY_CARE_PROVIDER_SITE_OTHER): Payer: Medicare Other | Admitting: Family Medicine

## 2022-08-11 VITALS — BP 122/70 | HR 77 | Resp 16 | Ht 75.0 in | Wt 210.0 lb

## 2022-08-11 DIAGNOSIS — K219 Gastro-esophageal reflux disease without esophagitis: Secondary | ICD-10-CM

## 2022-08-11 DIAGNOSIS — N4889 Other specified disorders of penis: Secondary | ICD-10-CM | POA: Diagnosis not present

## 2022-08-11 DIAGNOSIS — F3012 Manic episode without psychotic symptoms, moderate: Secondary | ICD-10-CM | POA: Diagnosis not present

## 2022-08-11 DIAGNOSIS — E538 Deficiency of other specified B group vitamins: Secondary | ICD-10-CM | POA: Diagnosis not present

## 2022-08-11 DIAGNOSIS — R7989 Other specified abnormal findings of blood chemistry: Secondary | ICD-10-CM

## 2022-08-11 DIAGNOSIS — N401 Enlarged prostate with lower urinary tract symptoms: Secondary | ICD-10-CM

## 2022-08-11 DIAGNOSIS — F988 Other specified behavioral and emotional disorders with onset usually occurring in childhood and adolescence: Secondary | ICD-10-CM | POA: Diagnosis not present

## 2022-08-11 DIAGNOSIS — D696 Thrombocytopenia, unspecified: Secondary | ICD-10-CM | POA: Diagnosis not present

## 2022-08-11 DIAGNOSIS — F79 Unspecified intellectual disabilities: Secondary | ICD-10-CM

## 2022-08-11 DIAGNOSIS — M170 Bilateral primary osteoarthritis of knee: Secondary | ICD-10-CM

## 2022-08-11 DIAGNOSIS — Z79899 Other long term (current) drug therapy: Secondary | ICD-10-CM | POA: Diagnosis not present

## 2022-08-11 DIAGNOSIS — N138 Other obstructive and reflux uropathy: Secondary | ICD-10-CM

## 2022-08-11 DIAGNOSIS — R4189 Other symptoms and signs involving cognitive functions and awareness: Secondary | ICD-10-CM | POA: Diagnosis not present

## 2022-08-12 LAB — COMPLETE METABOLIC PANEL WITH GFR
AG Ratio: 2.1 (calc) (ref 1.0–2.5)
ALT: 14 U/L (ref 9–46)
AST: 15 U/L (ref 10–35)
Albumin: 4.4 g/dL (ref 3.6–5.1)
Alkaline phosphatase (APISO): 70 U/L (ref 35–144)
BUN: 14 mg/dL (ref 7–25)
CO2: 27 mmol/L (ref 20–32)
Calcium: 9 mg/dL (ref 8.6–10.3)
Chloride: 99 mmol/L (ref 98–110)
Creat: 0.73 mg/dL (ref 0.70–1.35)
Globulin: 2.1 g/dL (calc) (ref 1.9–3.7)
Glucose, Bld: 85 mg/dL (ref 65–99)
Potassium: 4.5 mmol/L (ref 3.5–5.3)
Sodium: 135 mmol/L (ref 135–146)
Total Bilirubin: 0.5 mg/dL (ref 0.2–1.2)
Total Protein: 6.5 g/dL (ref 6.1–8.1)
eGFR: 104 mL/min/{1.73_m2} (ref >=60)

## 2022-08-12 LAB — PSA: PSA: 0.13 ng/mL (ref <=4.00)

## 2022-09-02 ENCOUNTER — Other Ambulatory Visit: Payer: Self-pay | Admitting: Family Medicine

## 2022-09-09 DIAGNOSIS — F3489 Other specified persistent mood disorders: Secondary | ICD-10-CM | POA: Diagnosis not present

## 2022-09-30 ENCOUNTER — Other Ambulatory Visit: Payer: Self-pay | Admitting: Family Medicine

## 2022-09-30 DIAGNOSIS — K219 Gastro-esophageal reflux disease without esophagitis: Secondary | ICD-10-CM

## 2022-09-30 DIAGNOSIS — N138 Other obstructive and reflux uropathy: Secondary | ICD-10-CM

## 2022-09-30 DIAGNOSIS — E782 Mixed hyperlipidemia: Secondary | ICD-10-CM

## 2022-09-30 DIAGNOSIS — R350 Frequency of micturition: Secondary | ICD-10-CM

## 2022-10-02 DIAGNOSIS — F79 Unspecified intellectual disabilities: Secondary | ICD-10-CM | POA: Diagnosis not present

## 2022-10-02 DIAGNOSIS — E119 Type 2 diabetes mellitus without complications: Secondary | ICD-10-CM | POA: Diagnosis not present

## 2022-10-02 DIAGNOSIS — I10 Essential (primary) hypertension: Secondary | ICD-10-CM | POA: Diagnosis not present

## 2022-10-02 DIAGNOSIS — R339 Retention of urine, unspecified: Secondary | ICD-10-CM | POA: Diagnosis not present

## 2022-10-02 DIAGNOSIS — N401 Enlarged prostate with lower urinary tract symptoms: Secondary | ICD-10-CM | POA: Diagnosis not present

## 2022-10-02 DIAGNOSIS — Z79899 Other long term (current) drug therapy: Secondary | ICD-10-CM | POA: Diagnosis not present

## 2022-10-02 DIAGNOSIS — E039 Hypothyroidism, unspecified: Secondary | ICD-10-CM | POA: Diagnosis not present

## 2022-10-02 DIAGNOSIS — E785 Hyperlipidemia, unspecified: Secondary | ICD-10-CM | POA: Diagnosis not present

## 2022-10-02 DIAGNOSIS — F319 Bipolar disorder, unspecified: Secondary | ICD-10-CM | POA: Diagnosis not present

## 2022-10-02 DIAGNOSIS — N5082 Scrotal pain: Secondary | ICD-10-CM | POA: Diagnosis not present

## 2022-10-07 ENCOUNTER — Other Ambulatory Visit: Payer: Self-pay | Admitting: Family Medicine

## 2022-10-07 ENCOUNTER — Other Ambulatory Visit: Payer: Self-pay | Admitting: Internal Medicine

## 2022-10-07 DIAGNOSIS — J309 Allergic rhinitis, unspecified: Secondary | ICD-10-CM

## 2022-10-07 DIAGNOSIS — E782 Mixed hyperlipidemia: Secondary | ICD-10-CM

## 2022-10-08 NOTE — Telephone Encounter (Signed)
Requested Prescriptions  Pending Prescriptions Disp Refills   fexofenadine (ALLEGRA) 60 MG tablet [Pharmacy Med Name: Fexofenadine HCl 60 MG Tablet] 14 tablet 10    Sig: TAKE 1 TABLET BY MOUTH TWICE DAILY     Ear, Nose, and Throat:  Antihistamines Passed - 10/07/2022  4:39 PM      Passed - Valid encounter within last 12 months    Recent Outpatient Visits           1 month ago Thrombocytopenia [facility])   [facility] Steele Sizer, MD   6 months ago Rash in adult   [facility] Steele Sizer, MD   8 months ago Bipolar I disorder, single manic episode, moderate [facility])   [facility] Steele Sizer, MD   1 year ago [facility] Steele Sizer, MD   1 year ago Moderate protein-calorie malnutrition [facility])   [facility] Steele Sizer, MD       Future Appointments             In 2 months Ancil Boozer, Drue Stager, MD [facility], [place]   In 9 months  [facility], [facility]

## 2022-10-28 ENCOUNTER — Inpatient Hospital Stay: Payer: Medicare Other

## 2022-10-28 ENCOUNTER — Inpatient Hospital Stay: Payer: Medicare Other | Attending: Oncology | Admitting: Oncology

## 2022-10-28 ENCOUNTER — Encounter: Payer: Self-pay | Admitting: Oncology

## 2022-10-28 VITALS — BP 125/77 | HR 60 | Temp 96.6°F | Wt 206.9 lb

## 2022-10-28 DIAGNOSIS — R634 Abnormal weight loss: Secondary | ICD-10-CM | POA: Diagnosis not present

## 2022-10-28 DIAGNOSIS — D696 Thrombocytopenia, unspecified: Secondary | ICD-10-CM

## 2022-10-28 DIAGNOSIS — Z79899 Other long term (current) drug therapy: Secondary | ICD-10-CM | POA: Diagnosis not present

## 2022-10-28 DIAGNOSIS — R161 Splenomegaly, not elsewhere classified: Secondary | ICD-10-CM | POA: Diagnosis not present

## 2022-10-28 LAB — CBC WITH DIFFERENTIAL/PLATELET
Abs Immature Granulocytes: 0.01 10*3/uL (ref 0.00–0.07)
Basophils Absolute: 0 10*3/uL (ref 0.0–0.1)
Basophils Relative: 1 %
Eosinophils Absolute: 0.1 10*3/uL (ref 0.0–0.5)
Eosinophils Relative: 2 %
HCT: 38.1 % — ABNORMAL LOW (ref 39.0–52.0)
Hemoglobin: 13.5 g/dL (ref 13.0–17.0)
Immature Granulocytes: 0 %
Lymphocytes Relative: 22 %
Lymphs Abs: 1 10*3/uL (ref 0.7–4.0)
MCH: 32.5 pg (ref 26.0–34.0)
MCHC: 35.4 g/dL (ref 30.0–36.0)
MCV: 91.6 fL (ref 80.0–100.0)
Monocytes Absolute: 0.4 10*3/uL (ref 0.1–1.0)
Monocytes Relative: 9 %
Neutro Abs: 3.1 10*3/uL (ref 1.7–7.7)
Neutrophils Relative %: 66 %
Platelets: 112 10*3/uL — ABNORMAL LOW (ref 150–400)
RBC: 4.16 MIL/uL — ABNORMAL LOW (ref 4.22–5.81)
RDW: 12 % (ref 11.5–15.5)
WBC: 4.7 10*3/uL (ref 4.0–10.5)
nRBC: 0 % (ref 0.0–0.2)

## 2022-10-28 LAB — COMPREHENSIVE METABOLIC PANEL
ALT: 17 U/L (ref 0–44)
AST: 21 U/L (ref 15–41)
Albumin: 4 g/dL (ref 3.5–5.0)
Alkaline Phosphatase: 70 U/L (ref 38–126)
Anion gap: 8 (ref 5–15)
BUN: 11 mg/dL (ref 8–23)
CO2: 25 mmol/L (ref 22–32)
Calcium: 8.6 mg/dL — ABNORMAL LOW (ref 8.9–10.3)
Chloride: 99 mmol/L (ref 98–111)
Creatinine, Ser: 0.64 mg/dL (ref 0.61–1.24)
GFR, Estimated: >60 mL/min (ref >60)
Glucose, Bld: 91 mg/dL (ref 70–99)
Potassium: 3.9 mmol/L (ref 3.5–5.1)
Sodium: 132 mmol/L — ABNORMAL LOW (ref 135–145)
Total Bilirubin: 0.7 mg/dL (ref 0.3–1.2)
Total Protein: 6.5 g/dL (ref 6.5–8.1)

## 2022-10-28 LAB — IMMATURE PLATELET FRACTION: Immature Platelet Fraction: 6.2 % (ref 1.2–8.6)

## 2022-10-28 NOTE — Assessment & Plan Note (Signed)
Patient continues to lose weight despite eating normal portion of meals. Patient had EGD and colonoscopy done, no findings to explain weight loss. Recommend to check CT chest without contrast.  Repeat ultrasound abdomen complete

## 2022-10-28 NOTE — Assessment & Plan Note (Signed)
Etiology unknown.  No cirrhosis on CT scan.  Check ultrasound abdomen

## 2022-10-28 NOTE — Progress Notes (Signed)
Hematology/Oncology Progress note [phone] [phone]     CHIEF COMPLAINTS/REASON FOR VISIT:  thrombocytopenia, weight loss   ASSESSMENT & PLAN:   Thrombocytopenia (HCC) Thrombocytopenia, platelet counts are  Stable.  This could be secondary to carbamazepine/Abilify, or due to splenomegaly. Continue observation.  Weight loss, unintentional Patient continues to lose weight despite eating normal portion of meals. Patient had EGD and colonoscopy done, no findings to explain weight loss. Recommend to check CT chest without contrast.  Repeat ultrasound abdomen complete  Splenomegaly Etiology unknown.  No cirrhosis on CT scan.  Check ultrasound abdomen   Orders Placed This Encounter  Procedures   CT Chest Wo Contrast    Standing Status:   Future    Standing Expiration Date:   10/28/2023    Order Specific Question:   Preferred imaging location?    Answer:   [facility]   US Abdomen Complete    Standing Status:   Future    Standing Expiration Date:   10/28/2023    Order Specific Question:   Reason for Exam (SYMPTOM  OR DIAGNOSIS REQUIRED)    Answer:   splenomegaly, weight loss    Order Specific Question:   Preferred imaging location?    Answer:   [facility]   Follow-up to be determined. All questions were answered. The patient knows to call the clinic with any problems, questions or concerns.  Earlie Server, MD, PhD [facility] Hematology Oncology 10/28/2022    HISTORY OF PRESENTING ILLNESS:  Wayne Jones is a 62 y.o. male who was seen in consultation at the request of Steele Sizer, MD for evaluation of thrombocytopenia   Reviewed patient's labs done previously.  12/04/2020 labs showed decreased platelet counts at 99,000.  Normal wbc  hemoglobin  Reviewed patient's previous labs. Thrombocytopenia is chronic chronic onset , since at least 2014 with baseline around 100,000. Patient has a history of mental disorders and lives in a group  home.  He is a poor historian.  Denies any new complaints.  Denies any easy bleeding or bruising.  He was accompanied by staff member from the group home.  Staff denies any recent change of Abilify and carbamazepine.  INTERVAL HISTORY Wayne Jones is a 62 y.o. male who has above history reviewed by me today presents for follow up visit for management of thrombocytopenia and weight loss Patient was accompanied by group home staff.  Per staff, patient eats normal portion of his meals. Patient continues to lose weight.  14 pounds since last visit. Patient is a poor historian.   Review of Systems  Unable to perform ROS: Other (Mental disorder)  Constitutional:  Negative for appetite change, fatigue and unexpected weight change.  Respiratory:  Negative for cough and shortness of breath.   Cardiovascular:  Negative for chest pain.  Genitourinary:  Negative for dysuria.   Hematological:  Negative for adenopathy. Does not bruise/bleed easily.    MEDICAL HISTORY:  Past Medical History:  Diagnosis Date   Depression    Diabetes mellitus without complication ([place])    Hyperlipidemia    Hypertension    Mental disorder     SURGICAL HISTORY: Past Surgical History:  Procedure Laterality Date   COLONOSCOPY WITH PROPOFOL N/A 11/19/2021   Procedure: COLONOSCOPY WITH PROPOFOL;  Surgeon: Lin Landsman, MD;  Location: [facility] ENDOSCOPY;  Service: Gastroenterology;  Laterality: N/A;   ESOPHAGOGASTRODUODENOSCOPY (EGD) WITH PROPOFOL N/A 11/19/2021   Procedure: ESOPHAGOGASTRODUODENOSCOPY (EGD) WITH PROPOFOL;  Surgeon: Lin Landsman, MD;  Location: [facility]  ENDOSCOPY;  Service: Gastroenterology;  Laterality: N/A;   LAPAROSCOPIC CHOLECYSTECTOMY     LAPAROSCOPY  12/03/2010   VENTRAL HERNIA REPAIR      SOCIAL HISTORY: Social History   Socioeconomic History   Marital status: Single    Spouse name: Not on file   Number of children: Not on file   Years of education: Not on file   Highest  education level: Not on file  Occupational History   Occupation: disabled    Comment: Patient in care home  Tobacco Use   Smoking status: Never   Smokeless tobacco: Never  Vaping Use   Vaping Use: Never used  Substance and Sexual Activity   Alcohol use: No    Alcohol/week: 0.0 standard drinks of alcohol   Drug use: No   Sexual activity: Never  Other Topics Concern   Not on file  Social History Narrative   [name] calls him twice weekly. Pt resides at [facility] group home at [employer]    Social Determinants of Health   Financial Resource Strain: Low Risk  (07/10/2022)   Overall Financial Resource Strain (CARDIA)    Difficulty of Paying Living Expenses: Not hard at all  Food Insecurity: No Food Insecurity (07/10/2022)   Hunger Vital Sign    Worried About Running Out of Food in the Last Year: Never true    Ran Out of Food in the Last Year: Never true  Transportation Needs: No Transportation Needs (07/10/2022)   PRAPARE - Hydrologist (Medical): No    Lack of Transportation (Non-Medical): No  Physical Activity: Sufficiently Active (07/10/2022)   Exercise Vital Sign    Days of Exercise per Week: 5 days    Minutes of Exercise per Session: 30 min  Stress: No Stress Concern Present (07/10/2022)   [place]    Feeling of Stress : Not at all  Social Connections: Socially Isolated (07/10/2022)   Social Connection and Isolation Panel [NHANES]    Frequency of Communication with Friends and Family: More than three times a week    Frequency of Social Gatherings with Friends and Family: Once a week    Attends Religious Services: Never    Marine scientist or Organizations: No    Attends Archivist Meetings: Never    Marital Status: Never married  Intimate Partner Violence: Not At Risk (07/10/2022)   Humiliation, Afraid, Rape, and Kick questionnaire    Fear of Current or  Ex-Partner: No    Emotionally Abused: No    Physically Abused: No    Sexually Abused: No    FAMILY HISTORY: Family History  Family history unknown: Yes    ALLERGIES:  has No Known Allergies.  MEDICATIONS:  Current Outpatient Medications  Medication Sig Dispense Refill   ABILIFY 15 MG tablet Take 15 mg by mouth daily.     acetaminophen (TYLENOL) 500 MG tablet Take 500 mg by mouth 3 (three) times daily.     antiseptic oral rinse (BIOTENE) LIQD 15 mLs by Mouth Rinse route as needed for dry mouth.     atorvastatin (LIPITOR) 10 MG tablet TAKE 1 TABLET BY MOUTH ONCE DAILY 30 tablet 5   carbamazepine (EQUETRO) 200 MG CP12 12 hr capsule Take 200 mg by mouth 2 (two) times daily.     carbamide peroxide (DEBROX) 6.5 % OTIC solution PLACE 5 DROPS INTO BOTH EARS TWO TIMES A DAY 15 mL 0  Cholecalciferol (VITAMIN D3) 1.25 MG (50000 UT) CAPS Take 1 capsule by mouth every 14 (fourteen) days.     clonazePAM (KLONOPIN) 1 MG tablet Take 1 mg by mouth daily.     Cyanocobalamin 1000 MCG/15ML LIQD Take 5 mLs (333.3333 mcg total) by mouth daily. 450 mL 5   DENTA 5000 PLUS 1.1 % CREA dental cream Take by mouth.     DULoxetine (CYMBALTA) 60 MG capsule Take 60 mg by mouth daily.     Elastic Bandages & Supports (MEDICAL COMPRESSION SOCKS) MISC 2 each by Does not apply route daily. Apply in am's and remove it at bedtime 2 each 1   famotidine (PEPCID) 40 MG tablet TAKE 1 TABLET BY MOUTH ONCE DAILY 30 tablet 2   feeding supplement, ENSURE COMPLETE, (ENSURE COMPLETE) LIQD Take 237 mLs by mouth 2 (two) times daily between meals. 237 mL 3   fexofenadine (ALLEGRA) 60 MG tablet TAKE 1 TABLET BY MOUTH TWICE DAILY 60 tablet 5   finasteride (PROSCAR) 5 MG tablet Take 5 mg by mouth daily.     fluticasone (FLONASE) 50 MCG/ACT nasal spray Place 2 sprays into both nostrils daily. 16 g 6   gabapentin (NEURONTIN) 300 MG capsule Take 300 mg by mouth in the morning.     gabapentin (NEURONTIN) 600 MG tablet Take 600 mg by mouth  at bedtime.     ketoconazole (NIZORAL) 2 % cream APPLY TOPICALLY 1 APPLICATION TO GROIN RASH ONCE DAILY 60 g 11   lisinopril (ZESTRIL) 5 MG tablet Take 1 tablet (5 mg total) by mouth daily. 90 tablet 1   Lubricants (K-Y JELLY) GEL USE  AS NEEDED TO BE ABLE TO MASTURBATE 57 g 10   meloxicam (MOBIC) 15 MG tablet Take 1 tablet (15 mg total) by mouth daily as needed for pain. Please don't give it daily 30 tablet 5   nystatin cream (MYCOSTATIN) Apply topically 2 (two) times daily. 30 g 10   omega-3 acid ethyl esters (LOVAZA) 1 g capsule TAKE 1 CAPSULE BY MOUTH TWICE DAILY  *DO NOT CRUSH OR CHEW* 14 capsule 10   omeprazole (PRILOSEC) 20 MG capsule TAKE 1 CAPSULE BY MOUTH ONCE DAILY *DO NOT CRUSH OR CHEW* 7 capsule 10   ondansetron (ZOFRAN) 4 MG tablet TAKE 1 TABLET BY MOUTH EVERY EIGHT HOURS AS NEEDED FOR NAUSEA / VOMITING 30 tablet 11   Polyethyl Glycol-Propyl Glycol (SYSTANE) 0.4-0.3 % SOLN Apply to eye.     tamsulosin (FLOMAX) 0.4 MG CAPS capsule TAKE 1 CAPSULE BY MOUTH ONCE DAILY  *DO NOT CRUSH OR CHEW* **DO NOT OPEN CAPSULE** 7 capsule 10   triamcinolone cream (KENALOG) 0.1 % Apply 1 Application topically 2 (two) times daily. Use for 7 days and follow up if no resolution - right arm 30 g 0   Vitamin D, Ergocalciferol, (DRISDOL) 1.25 MG (50000 UNIT) CAPS capsule Take 50,000 Units by mouth every 14 (fourteen) days.     Vitamins A & D (A+D PREVENT) OINT ointment APPLY TOPICALLY AS NEEDED FOR DRY SKIN 113 g 11   Current Facility-Administered Medications  Medication Dose Route Frequency Provider Last Rate Last Admin   nystatin (MYCOSTATIN/NYSTOP) topical powder   Topical PRN Steele Sizer, MD         PHYSICAL EXAMINATION: ECOG PERFORMANCE STATUS: 1 - Symptomatic but completely ambulatory Vitals:   10/28/22 1000  BP: 125/77  Pulse: 60  Temp: (!) 96.6 F (35.9 C)  SpO2: 100%   Filed Weights   10/28/22 1000  Weight: 206  lb 14.4 oz (93.8 kg)    Physical Exam Constitutional:       General: He is not in acute distress. HENT:     Head: Normocephalic and atraumatic.  Eyes:     General: No scleral icterus. Cardiovascular:     Rate and Rhythm: Normal rate and regular rhythm.  Pulmonary:     Effort: Pulmonary effort is normal. No respiratory distress.     Breath sounds: No wheezing.  Abdominal:     General: There is no distension.     Palpations: Abdomen is soft.  Musculoskeletal:        General: No deformity. Normal range of motion.     Cervical back: Normal range of motion and neck supple.  Skin:    General: Skin is warm and dry.     Findings: No erythema or rash.  Neurological:     Mental Status: He is alert. Mental status is at baseline.  Psychiatric:     Comments: Flat affect      LABORATORY DATA:  I have reviewed the data as listed Lab Results  Component Value Date   WBC 4.7 10/28/2022   HGB 13.5 10/28/2022   HCT 38.1 (L) 10/28/2022   MCV 91.6 10/28/2022   PLT 112 (L) 10/28/2022   Recent Labs    04/08/22 1420 08/11/22 0903 10/28/22 0952  NA 133* 135 132*  K 4.5 4.5 3.9  CL 97* 99 99  CO2 28 27 25   GLUCOSE 113* 85 91  BUN 11 14 11   CREATININE 0.78 0.73 0.64  CALCIUM 8.6 9.0 8.6*  GFRNONAA  --   --  >60  PROT 5.9* 6.5 6.5  ALBUMIN  --   --  4.0  AST 14 15 21   ALT 15 14 17   ALKPHOS  --   --  70  BILITOT 0.4 0.5 0.7    Iron/TIBC/Ferritin/ %Sat No results found for: "IRON", "TIBC", "FERRITIN", "IRONPCTSAT"    RADIOGRAPHIC STUDIES: I have personally reviewed the radiological images as listed and agreed with the findings in the report.  No results found.

## 2022-10-28 NOTE — Assessment & Plan Note (Signed)
Thrombocytopenia, platelet counts are  Stable.  This could be secondary to carbamazepine/Abilify, or due to splenomegaly. Continue observation.

## 2022-10-31 ENCOUNTER — Ambulatory Visit: Payer: Medicare Other | Admitting: Oncology

## 2022-10-31 ENCOUNTER — Other Ambulatory Visit: Payer: Medicare Other

## 2022-10-31 ENCOUNTER — Ambulatory Visit
Admission: RE | Admit: 2022-10-31 | Discharge: 2022-10-31 | Disposition: A | Discharge status: Home / Self Care | Payer: Medicare Other | Source: Ambulatory Visit | Attending: Oncology | Admitting: Oncology

## 2022-10-31 DIAGNOSIS — R634 Abnormal weight loss: Secondary | ICD-10-CM | POA: Insufficient documentation

## 2022-10-31 DIAGNOSIS — R161 Splenomegaly, not elsewhere classified: Secondary | ICD-10-CM | POA: Insufficient documentation

## 2022-11-05 ENCOUNTER — Other Ambulatory Visit: Payer: Self-pay | Admitting: Oncology

## 2022-11-05 ENCOUNTER — Ambulatory Visit: Payer: Medicare Other

## 2022-11-05 DIAGNOSIS — D696 Thrombocytopenia, unspecified: Secondary | ICD-10-CM

## 2022-11-05 DIAGNOSIS — R161 Splenomegaly, not elsewhere classified: Secondary | ICD-10-CM

## 2022-11-06 ENCOUNTER — Ambulatory Visit
Admission: RE | Admit: 2022-11-06 | Discharge: 2022-11-06 | Disposition: A | Discharge status: Home / Self Care | Payer: Medicare Other | Source: Ambulatory Visit | Attending: Family Medicine | Admitting: Family Medicine

## 2022-11-06 ENCOUNTER — Telehealth: Payer: Self-pay

## 2022-11-06 DIAGNOSIS — R161 Splenomegaly, not elsewhere classified: Secondary | ICD-10-CM

## 2022-11-06 DIAGNOSIS — R634 Abnormal weight loss: Secondary | ICD-10-CM

## 2022-11-06 NOTE — Telephone Encounter (Signed)
Called and and spoke to [place] (caregiver), who states that they thought CT appt was today. He is also aware of the need for additional labs.   Please r/s CT and call Phillips Odor @3  [phone] with appt details.   Please schedule pt for labs on 4/12 @ 8:30.

## 2022-11-06 NOTE — Telephone Encounter (Signed)
-----   Message from Earlie Server, MD sent at 11/05/2022  9:05 PM EDT ----- He no showed to his CT, please reschedule Splenomegaly, please arrange him to get additional work up. I have placed orders.  Please arrange him to follow up with me MD only a few days after CT thx

## 2022-11-10 ENCOUNTER — Ambulatory Visit
Admission: RE | Admit: 2022-11-10 | Discharge: 2022-11-10 | Disposition: A | Discharge status: Home / Self Care | Payer: Medicare Other | Source: Ambulatory Visit | Attending: Oncology | Admitting: Oncology

## 2022-11-10 DIAGNOSIS — R634 Abnormal weight loss: Secondary | ICD-10-CM | POA: Insufficient documentation

## 2022-11-10 DIAGNOSIS — R161 Splenomegaly, not elsewhere classified: Secondary | ICD-10-CM | POA: Diagnosis not present

## 2022-11-10 DIAGNOSIS — R918 Other nonspecific abnormal finding of lung field: Secondary | ICD-10-CM | POA: Diagnosis not present

## 2022-11-11 ENCOUNTER — Encounter: Payer: Self-pay | Admitting: Podiatry

## 2022-11-12 ENCOUNTER — Telehealth: Payer: Self-pay

## 2022-11-12 NOTE — Telephone Encounter (Signed)
Spoke to Wayne Jones on 4/4 regarding additional labwork and pt is scheduled on 4/12 for labs.  Please schedule MD follow up 2- 3 weeks after labs and inform [name] of appts [phone].

## 2022-11-12 NOTE — Telephone Encounter (Signed)
-----   Message from Rickard Patience, MD sent at 11/12/2022  8:51 AM EDT ----- CT chest did not reveal any findings concerning for cancer.  US showed enlarged spleen and fatty liver disease.  I ordered additional blood work please arrange him to get labs done.  MD follow up 2-3 weeks after his blood work is done.thanks.  zy

## 2022-11-14 ENCOUNTER — Inpatient Hospital Stay: Payer: Medicare Other | Attending: Oncology

## 2022-11-14 DIAGNOSIS — R161 Splenomegaly, not elsewhere classified: Secondary | ICD-10-CM | POA: Diagnosis not present

## 2022-11-14 DIAGNOSIS — R634 Abnormal weight loss: Secondary | ICD-10-CM | POA: Diagnosis not present

## 2022-11-14 DIAGNOSIS — D696 Thrombocytopenia, unspecified: Secondary | ICD-10-CM | POA: Insufficient documentation

## 2022-11-14 LAB — CBC WITH DIFFERENTIAL/PLATELET
Abs Immature Granulocytes: 0.01 10*3/uL (ref 0.00–0.07)
Basophils Absolute: 0 10*3/uL (ref 0.0–0.1)
Basophils Relative: 0 %
Eosinophils Absolute: 0.1 10*3/uL (ref 0.0–0.5)
Eosinophils Relative: 1 %
HCT: 40.8 % (ref 39.0–52.0)
Hemoglobin: 14.2 g/dL (ref 13.0–17.0)
Immature Granulocytes: 0 %
Lymphocytes Relative: 17 %
Lymphs Abs: 0.8 10*3/uL (ref 0.7–4.0)
MCH: 32.4 pg (ref 26.0–34.0)
MCHC: 34.8 g/dL (ref 30.0–36.0)
MCV: 93.2 fL (ref 80.0–100.0)
Monocytes Absolute: 0.3 10*3/uL (ref 0.1–1.0)
Monocytes Relative: 8 %
Neutro Abs: 3.3 10*3/uL (ref 1.7–7.7)
Neutrophils Relative %: 74 %
Platelets: 111 10*3/uL — ABNORMAL LOW (ref 150–400)
RBC: 4.38 MIL/uL (ref 4.22–5.81)
RDW: 12.2 % (ref 11.5–15.5)
WBC: 4.5 10*3/uL (ref 4.0–10.5)
nRBC: 0 % (ref 0.0–0.2)

## 2022-11-14 LAB — COMPREHENSIVE METABOLIC PANEL
ALT: 18 U/L (ref 0–44)
AST: 24 U/L (ref 15–41)
Albumin: 4.1 g/dL (ref 3.5–5.0)
Alkaline Phosphatase: 68 U/L (ref 38–126)
Anion gap: 7 (ref 5–15)
BUN: 14 mg/dL (ref 8–23)
CO2: 26 mmol/L (ref 22–32)
Calcium: 8.6 mg/dL — ABNORMAL LOW (ref 8.9–10.3)
Chloride: 103 mmol/L (ref 98–111)
Creatinine, Ser: 0.64 mg/dL (ref 0.61–1.24)
GFR, Estimated: >60 mL/min (ref >60)
Glucose, Bld: 119 mg/dL — ABNORMAL HIGH (ref 70–99)
Potassium: 4 mmol/L (ref 3.5–5.1)
Sodium: 136 mmol/L (ref 135–145)
Total Bilirubin: 0.9 mg/dL (ref 0.3–1.2)
Total Protein: 6.7 g/dL (ref 6.5–8.1)

## 2022-11-14 LAB — LACTATE DEHYDROGENASE: LDH: 107 U/L (ref 98–192)

## 2022-11-17 LAB — KAPPA/LAMBDA LIGHT CHAINS
Kappa free light chain: 19.6 mg/L — ABNORMAL HIGH (ref 3.3–19.4)
Kappa, lambda light chain ratio: 1.09 (ref 0.26–1.65)
Lambda free light chains: 18 mg/L (ref 5.7–26.3)

## 2022-11-17 LAB — COMP PANEL: LEUKEMIA/LYMPHOMA

## 2022-11-17 LAB — EPSTEIN BARR VRS(EBV DNA BY PCR): EBV DNA QN by PCR: NEGATIVE IU/mL

## 2022-11-17 LAB — CMV DNA, QUANTITATIVE, PCR
CMV DNA Quant: NEGATIVE IU/mL
Log10 CMV Qn DNA Pl: UNDETERMINED log10 IU/mL

## 2022-11-18 LAB — ANTINUCLEAR ANTIBODIES, IFA: ANA Ab, IFA: POSITIVE — AB

## 2022-11-18 LAB — FANA STAINING PATTERNS: Homogeneous Pattern: 1

## 2022-11-19 LAB — MULTIPLE MYELOMA PANEL, SERUM
Albumin SerPl Elph-Mcnc: 3.9 g/dL (ref 2.9–4.4)
Albumin/Glob SerPl: 1.7 (ref 0.7–1.7)
Alpha 1: 0.3 g/dL (ref 0.0–0.4)
Alpha2 Glob SerPl Elph-Mcnc: 0.4 g/dL (ref 0.4–1.0)
B-Globulin SerPl Elph-Mcnc: 0.8 g/dL (ref 0.7–1.3)
Gamma Glob SerPl Elph-Mcnc: 0.9 g/dL (ref 0.4–1.8)
Globulin, Total: 2.3 g/dL (ref 2.2–3.9)
IgA: 134 mg/dL (ref 61–437)
IgG (Immunoglobin G), Serum: 951 mg/dL (ref 603–1613)
IgM (Immunoglobulin M), Srm: 94 mg/dL (ref 20–172)
Total Protein ELP: 6.2 g/dL (ref 6.0–8.5)

## 2022-11-24 ENCOUNTER — Ambulatory Visit: Payer: Medicare Other | Admitting: Podiatry

## 2022-11-25 ENCOUNTER — Other Ambulatory Visit: Payer: Self-pay | Admitting: Family Medicine

## 2022-11-25 DIAGNOSIS — I10 Essential (primary) hypertension: Secondary | ICD-10-CM

## 2022-11-25 NOTE — Telephone Encounter (Signed)
Requested Prescriptions  Pending Prescriptions Disp Refills   lisinopril (ZESTRIL) 5 MG tablet [Pharmacy Med Name: Lisinopril 5 MG Tablet] 90 tablet 0    Sig: TAKE 1 TABLET BY MOUTH ONCE DAILY     Cardiovascular:  ACE Inhibitors Passed - 11/25/2022  5:05 PM      Passed - Cr in normal range and within 180 days    Creat  Date Value Ref Range Status  08/11/2022 0.73 0.70 - 1.35 mg/dL Final   Creatinine, Ser  Date Value Ref Range Status  11/14/2022 0.64 0.61 - 1.24 mg/dL Final   Creatinine, Urine  Date Value Ref Range Status  09/27/2018 30 20 - 320 mg/dL Final         Passed - K in normal range and within 180 days    Potassium  Date Value Ref Range Status  11/14/2022 4.0 3.5 - 5.1 mmol/L Final  04/10/2013 3.7 3.5 - 5.1 mmol/L Final         Passed - Patient is not pregnant      Passed - Last BP in normal range    BP Readings from Last 1 Encounters:  10/28/22 125/77         Passed - Valid encounter within last 6 months    Recent Outpatient Visits           3 months ago Thrombocytopenia [facility])   [place] [facility] Alba Cory, MD   7 months ago Rash in adult   [facility] Alba Cory, MD   10 months ago Bipolar I disorder, single manic episode, moderate [facility])   [place] [facility] Alba Cory, MD   1 year ago Dysuria   [facility] Alba Cory, MD   1 year ago Moderate protein-calorie malnutrition [facility])   [place] [facility] Alba Cory, MD       Future Appointments             In 2 weeks Carlynn Purl, Danna Hefty, MD [facility]   In 7 months  [facility]

## 2022-11-27 LAB — CALR +MPL + E12-E15  (REFLEX)

## 2022-11-27 LAB — JAK2 V617F RFX CALR/MPL/E12-15

## 2022-12-05 ENCOUNTER — Inpatient Hospital Stay: Payer: Medicare Other | Attending: Oncology | Admitting: Oncology

## 2022-12-05 ENCOUNTER — Inpatient Hospital Stay: Payer: Medicare Other

## 2022-12-05 ENCOUNTER — Encounter: Payer: Self-pay | Admitting: Oncology

## 2022-12-05 VITALS — BP 125/81 | HR 66 | Temp 97.0°F | Resp 18 | Wt 205.0 lb

## 2022-12-05 DIAGNOSIS — R630 Anorexia: Secondary | ICD-10-CM | POA: Diagnosis not present

## 2022-12-05 DIAGNOSIS — E079 Disorder of thyroid, unspecified: Secondary | ICD-10-CM | POA: Diagnosis not present

## 2022-12-05 DIAGNOSIS — R634 Abnormal weight loss: Secondary | ICD-10-CM | POA: Diagnosis not present

## 2022-12-05 DIAGNOSIS — R161 Splenomegaly, not elsewhere classified: Secondary | ICD-10-CM | POA: Diagnosis not present

## 2022-12-05 DIAGNOSIS — D696 Thrombocytopenia, unspecified: Secondary | ICD-10-CM | POA: Diagnosis not present

## 2022-12-05 NOTE — Progress Notes (Signed)
Hematology/Oncology Progress note [phone] [phone]     CHIEF COMPLAINTS/REASON FOR VISIT:  thrombocytopenia, weight loss   ASSESSMENT & PLAN:   Thrombocytopenia (HCC) Thrombocytopenia, platelet counts are Stable.   This could be secondary to carbamazepine/Abilify, or due to splenomegaly. Continue observation.  Weight loss, unintentional Patient continues to lose weight despite eating normal portion of meals. Patient had EGD and colonoscopy done, no findings to explain weight loss. CT chest showed  tree-in-bud nodularity peripheral right lower lobe, chronic  No finding to explain the patient's history of weight loss  Splenomegaly Persistent mild splenomegaly on [place] JAK2 V617F mutation negative, with reflex to other mutations CALR, MPL, JAK 2 Ex 12-15 mutations negative. Low ANA titer 1:80, EBV/CMV negative.  Observation.    Thyroid mass Recommend thyroid US    Orders Placed This Encounter  Procedures   US Soft Tissue Head/Neck    Standing Status:   Future    Standing Expiration Date:   12/05/2023    Order Specific Question:   Reason for Exam (SYMPTOM  OR DIAGNOSIS REQUIRED)    Answer:   thyroid mass    Order Specific Question:   Preferred imaging location?    Answer:   [facility]   CBC with Differential (Cancer Center Only)    Standing Status:   Future    Standing Expiration Date:   12/05/2023   CMP (Cancer Center only)    Standing Status:   Future    Standing Expiration Date:   12/05/2023   TSH    Standing Status:   Future    Standing Expiration Date:   12/05/2023   T4    Standing Status:   Future    Standing Expiration Date:   12/05/2023   Follow-up 6 months.  All questions were answered. The patient knows to call the clinic with any problems, questions or concerns.  Rickard Patience, MD, PhD [facility] Hematology Oncology 12/05/2022    HISTORY OF PRESENTING ILLNESS:  Wayne Jones is a 62 y.o. male who was seen in consultation at  the request of Alba Cory, MD for evaluation of thrombocytopenia   Reviewed patient's labs done previously.  12/04/2020 labs showed decreased platelet counts at 99,000.  Normal wbc  hemoglobin  Reviewed patient's previous labs. Thrombocytopenia is chronic chronic onset , since at least 2014 with baseline around 100,000. Patient has a history of mental disorders and lives in a group home.  He is a poor historian.  Denies any new complaints.  Denies any easy bleeding or bruising.  He was accompanied by staff member from the group home.  Staff denies any recent change of Abilify and carbamazepine.  INTERVAL HISTORY Wayne Jones is a 62 y.o. male who has above history reviewed by me today presents for follow up visit for management of thrombocytopenia and weight loss Patient was accompanied by group home staff.  Per staff, patient eats normal portion of his meals. Weight is stable since 1 month ago.  Patient is a poor historian. Recent fall.    Review of Systems  Unable to perform ROS: Other (Mental disorder)  Constitutional:  Negative for appetite change, fatigue and unexpected weight change.  Respiratory:  Negative for cough and shortness of breath.   Cardiovascular:  Negative for chest pain.  Genitourinary:  Negative for dysuria.   Hematological:  Negative for adenopathy. Does not bruise/bleed easily.    MEDICAL HISTORY:  Past Medical History:  Diagnosis Date   Depression    Diabetes  mellitus without complication (HCC)    Hyperlipidemia    Hypertension    Mental disorder     SURGICAL HISTORY: Past Surgical History:  Procedure Laterality Date   COLONOSCOPY WITH PROPOFOL N/A 11/19/2021   Procedure: COLONOSCOPY WITH PROPOFOL;  Surgeon: Toney Reil, MD;  [facility] END[facility]COPY;  Service: Gastroenterology;  Laterality: N/A;   ESOPHAGOGASTRODUODENOSCOPY (EGD) WITH PROPOFOL N/A 11/19/2021   Procedure: ESOPHAGOGASTRODUODENOSCOPY (EGD) WITH PROPOFOL;  Surgeon: Toney Reil, MD;  Location: [facility] ENDOSCOPY;  Service: Gastroenterology;  Laterality: N/A;   LAPAROSCOPIC CHOLECYSTECTOMY     LAPAROSCOPY  12/03/2010   VENTRAL HERNIA REPAIR      SOCIAL HISTORY: Social History   Socioeconomic History   Marital status: Single    Spouse name: Not on file   Number of children: Not on file   Years of education: Not on file   Highest education level: Not on file  Occupational History   Occupation: disabled    Comment: Patient in care home  Tobacco Use   Smoking status: Never   Smokeless tobacco: Never  Vaping Use   Vaping Use: Never used  Substance and Sexual Activity   Alcohol use: No    Alcohol/week: 0.0 standard drinks of alcohol   Drug use: No   Sexual activity: Never  Other Topics Concern   Not on file  Social History Narrative   Vista Mink calls him twice weekly. Pt resides at [facility] group home at [facility].    Social Determinants of Health   Financial Resource Strain: Low Risk  (07/10/2022)   Overall Financial Resource Strain (CARDIA)    Difficulty of Paying Living Expenses: Not hard at all  Food Insecurity: No Food Insecurity (07/10/2022)   Hunger Vital Sign    Worried About Running Out of Food in the Last Year: Never true    Ran Out of Food in the Last Year: Never true  Transportation Needs: No Transportation Needs (07/10/2022)   PRAPARE - Administrator, Civil Service (Medical): No    Lack of Transportation (Non-Medical): No  Physical Activity: Sufficiently Active (07/10/2022)   Exercise Vital Sign    Days of Exercise per Week: 5 days    Minutes of Exercise per Session: 30 min  Stress: No Stress Concern Present (07/10/2022)   [employer] of Occupational Health - Occupational Stress Questionnaire    Feeling of Stress : Not at all  Social Connections: Socially Isolated (07/10/2022)   Social Connection and Isolation Panel [NHANES]    Frequency of Communication with Friends and Family: More than three times a  week    Frequency of Social Gatherings with Friends and Family: Once a week    Attends Religious Services: Never    Database administrator or Organizations: No    Attends Banker Meetings: Never    Marital Status: Never married  Intimate Partner Violence: Not At Risk (07/10/2022)   Humiliation, Afraid, Rape, and Kick questionnaire    Fear of Current or Ex-Partner: No    Emotionally Abused: No    Physically Abused: No    Sexually Abused: No    FAMILY HISTORY: Family History  Family history unknown: Yes    ALLERGIES:  has No Known Allergies.  MEDICATIONS:  Current Outpatient Medications  Medication Sig Dispense Refill   ABILIFY 15 MG tablet Take 15 mg by mouth daily.     acetaminophen (TYLENOL) 500 MG tablet Take 500 mg by mouth 3 (three) times daily.  antiseptic oral rinse (BIOTENE) LIQD 15 mLs by Mouth Rinse route as needed for dry mouth.     atorvastatin (LIPITOR) 10 MG tablet TAKE 1 TABLET BY MOUTH ONCE DAILY 30 tablet 5   carbamazepine (EQUETRO) 200 MG CP12 12 hr capsule Take 200 mg by mouth 2 (two) times daily.     carbamide peroxide (DEBROX) 6.5 % OTIC solution PLACE 5 DROPS INTO BOTH EARS TWO TIMES A DAY 15 mL 0   Cholecalciferol (VITAMIN D3) 1.25 MG (50000 UT) CAPS Take 1 capsule by mouth every 14 (fourteen) days.     clonazePAM (KLONOPIN) 1 MG tablet Take 1 mg by mouth daily.     Cyanocobalamin 1000 MCG/15ML LIQD Take 5 mLs (333.3333 mcg total) by mouth daily. 450 mL 5   DENTA 5000 PLUS 1.1 % CREA dental cream Take by mouth.     DULoxetine (CYMBALTA) 60 MG capsule Take 60 mg by mouth daily.     Elastic Bandages & Supports (MEDICAL COMPRESSION SOCKS) MISC 2 each by Does not apply route daily. Apply in am's and remove it at bedtime 2 each 1   famotidine (PEPCID) 40 MG tablet TAKE 1 TABLET BY MOUTH ONCE DAILY 30 tablet 2   feeding supplement, ENSURE COMPLETE, (ENSURE COMPLETE) LIQD Take 237 mLs by mouth 2 (two) times daily between meals. 237 mL 3    fexofenadine (ALLEGRA) 60 MG tablet TAKE 1 TABLET BY MOUTH TWICE DAILY 60 tablet 5   finasteride (PROSCAR) 5 MG tablet Take 5 mg by mouth daily.     fluticasone (FLONASE) 50 MCG/ACT nasal spray Place 2 sprays into both nostrils daily. 16 g 6   gabapentin (NEURONTIN) 300 MG capsule Take 300 mg by mouth in the morning.     gabapentin (NEURONTIN) 600 MG tablet Take 600 mg by mouth at bedtime.     ketoconazole (NIZORAL) 2 % cream APPLY TOPICALLY 1 APPLICATION TO GROIN RASH ONCE DAILY 60 g 11   lisinopril (ZESTRIL) 5 MG tablet TAKE 1 TABLET BY MOUTH ONCE DAILY 90 tablet 0   Lubricants (K-Y JELLY) GEL USE  AS NEEDED TO BE ABLE TO MASTURBATE 57 g 10   meloxicam (MOBIC) 15 MG tablet Take 1 tablet (15 mg total) by mouth daily as needed for pain. Please don't give it daily 30 tablet 5   nystatin cream (MYCOSTATIN) Apply topically 2 (two) times daily. 30 g 10   omega-3 acid ethyl esters (LOVAZA) 1 g capsule TAKE 1 CAPSULE BY MOUTH TWICE DAILY  *DO NOT CRUSH OR CHEW* 14 capsule 10   omeprazole (PRILOSEC) 20 MG capsule TAKE 1 CAPSULE BY MOUTH ONCE DAILY *DO NOT CRUSH OR CHEW* 7 capsule 10   ondansetron (ZOFRAN) 4 MG tablet TAKE 1 TABLET BY MOUTH EVERY EIGHT HOURS AS NEEDED FOR NAUSEA / VOMITING 30 tablet 11   Polyethyl Glycol-Propyl Glycol (SYSTANE) 0.4-0.3 % SOLN Apply to eye.     tamsulosin (FLOMAX) 0.4 MG CAPS capsule TAKE 1 CAPSULE BY MOUTH ONCE DAILY  *DO NOT CRUSH OR CHEW* **DO NOT OPEN CAPSULE** 7 capsule 10   triamcinolone cream (KENALOG) 0.1 % Apply 1 Application topically 2 (two) times daily. Use for 7 days and follow up if no resolution - right arm 30 g 0   Vitamin D, Ergocalciferol, (DRISDOL) 1.25 MG (50000 UNIT) CAPS capsule Take 50,000 Units by mouth every 14 (fourteen) days.     Vitamins A & D (A+D PREVENT) OINT ointment APPLY TOPICALLY AS NEEDED FOR DRY SKIN 113 g 11  Zoster Vac Recomb Adjuvanted (SHINGRIX IM) Inject into the muscle.     Current Facility-Administered Medications  Medication  Dose Route Frequency Provider Last Rate Last Admin   nystatin (MYCOSTATIN/NYSTOP) topical powder   Topical PRN Alba Cory, MD         PHYSICAL EXAMINATION: ECOG PERFORMANCE STATUS: 1 - Symptomatic but completely ambulatory Vitals:   12/05/22 1029  BP: 125/81  Pulse: 66  Resp: 18  Temp: (!) 97 F (36.1 C)  SpO2: 100%   Filed Weights   12/05/22 1029  Weight: 205 lb (93 kg)    Physical Exam Constitutional:      General: He is not in acute distress. HENT:     Head: Normocephalic and atraumatic.  Eyes:     General: No scleral icterus. Cardiovascular:     Rate and Rhythm: Normal rate and regular rhythm.  Pulmonary:     Effort: Pulmonary effort is normal. No respiratory distress.     Breath sounds: No wheezing.  Abdominal:     General: There is no distension.     Palpations: Abdomen is soft.  Musculoskeletal:        General: No deformity. Normal range of motion.     Cervical back: Normal range of motion and neck supple.  Skin:    General: Skin is warm and dry.     Findings: No erythema or rash.  Neurological:     Mental Status: He is alert. Mental status is at baseline.  Psychiatric:     Comments: Flat affect      LABORATORY DATA:  I have reviewed the data as listed Lab Results  Component Value Date   WBC 4.5 11/14/2022   HGB 14.2 11/14/2022   HCT 40.8 11/14/2022   MCV 93.2 11/14/2022   PLT 111 (L) 11/14/2022   Recent Labs    08/11/22 0903 10/28/22 0952 11/14/22 0837  NA 135 132* 136  K 4.5 3.9 4.0  CL 99 99 103  CO2 27 25 26   GLUCOSE 85 91 119*  BUN 14 11 14   CREATININE 0.73 0.64 0.64  CALCIUM 9.0 8.6* 8.6*  GFRNONAA  --  >60 >60  PROT 6.5 6.5 6.7  ALBUMIN  --  4.0 4.1  AST 15 21 24   ALT 14 17 18   ALKPHOS  --  70 68  BILITOT 0.5 0.7 0.9    Iron/TIBC/Ferritin/ %Sat No results found for: "IRON", "TIBC", "FERRITIN", "IRONPCTSAT"    RADIOGRAPHIC STUDIES: I have personally reviewed the radiological images as listed and agreed with the  findings in the report.  CT Chest Wo Contrast  Result Date: 11/10/2022 CLINICAL DATA:  Unexplained weight loss. EXAM: CT CHEST WITHOUT CONTRAST TECHNIQUE: Multidetector CT imaging of the chest was performed following the standard protocol without IV contrast. RADIATION DOSE REDUCTION: This exam was performed according to the departmental dose-optimization program which includes automated exposure control, adjustment of the mA and/or kV according to patient size and/or use of iterative reconstruction technique. COMPARISON:  None Available. FINDINGS: Cardiovascular: The heart size is normal. No substantial pericardial effusion. No thoracic aortic aneurysm. No substantial atherosclerosis of the thoracic aorta. Mediastinum/Nodes: Asymmetric enlargement of the right thyroid gland with potential 4.3 cm right thyroid nodule. No mediastinal lymphadenopathy. No evidence for gross hilar lymphadenopathy although assessment is limited by the lack of intravenous contrast on the current study. The esophagus has normal imaging features. There is no axillary lymphadenopathy. Lungs/Pleura: Minimal, subtle tree-in-bud nodularity noted peripheral right lower lobe in posterior left lower lobe  compatible sequelae of atypical infection not substantially changed since abdomen CT of 10/24/2021 no suspicious pulmonary nodule or mass. No focal airspace consolidation. No pleural effusion. Upper Abdomen: Unremarkable. Musculoskeletal: No worrisome lytic or sclerotic osseous abnormality. IMPRESSION: 1. No acute findings in the chest. Specifically, no findings to explain the patient's history of weight loss. 2. Minimal, subtle tree-in-bud nodularity peripheral right lower lobe in posterior left lower lobe compatible sequelae of atypical infection not substantially changed since abdomen CT of 10/24/2021. 3. Asymmetric enlargement of the right thyroid gland with potential 4.3 cm right thyroid nodule. Recommend thyroid US (ref: J Am Coll Radiol.  2015 Feb;12(2): 143-50). Electronically Signed   By: Kennith Center M.D.   On: 11/10/2022 11:19

## 2022-12-05 NOTE — Assessment & Plan Note (Signed)
Patient continues to lose weight despite eating normal portion of meals. Patient had EGD and colonoscopy done, no findings to explain weight loss. CT chest showed  tree-in-bud nodularity peripheral right lower lobe, chronic  No finding to explain the patient's history of weight loss

## 2022-12-05 NOTE — Assessment & Plan Note (Addendum)
Thrombocytopenia, platelet counts are  Stable.  This could be secondary to carbamazepine/Abilify, or due to splenomegaly. Continue observation. 

## 2022-12-05 NOTE — Assessment & Plan Note (Signed)
Recommend thyroid US

## 2022-12-05 NOTE — Progress Notes (Signed)
Concerns of recent fall

## 2022-12-05 NOTE — Assessment & Plan Note (Addendum)
Persistent mild splenomegaly on [place] JAK2 V617F mutation negative, with reflex to other mutations CALR, MPL, JAK 2 Ex 12-15 mutations negative. Low ANA titer 1:80, EBV/CMV negative.  Observation.

## 2022-12-08 ENCOUNTER — Inpatient Hospital Stay: Payer: Medicare Other

## 2022-12-08 DIAGNOSIS — R634 Abnormal weight loss: Secondary | ICD-10-CM | POA: Diagnosis not present

## 2022-12-08 DIAGNOSIS — E079 Disorder of thyroid, unspecified: Secondary | ICD-10-CM

## 2022-12-08 DIAGNOSIS — R161 Splenomegaly, not elsewhere classified: Secondary | ICD-10-CM | POA: Diagnosis not present

## 2022-12-08 DIAGNOSIS — D696 Thrombocytopenia, unspecified: Secondary | ICD-10-CM | POA: Diagnosis not present

## 2022-12-08 LAB — TSH: TSH: 0.527 u[IU]/mL (ref 0.350–4.500)

## 2022-12-09 DIAGNOSIS — F3489 Other specified persistent mood disorders: Secondary | ICD-10-CM | POA: Diagnosis not present

## 2022-12-09 LAB — T4: T4, Total: 5.6 ug/dL (ref 4.5–12.0)

## 2022-12-09 NOTE — Progress Notes (Deleted)
Name: Wayne Jones   MRN: [id number]    DOB: [birth year]   Date:12/09/2022       Progress Note  Subjective  Chief Complaint  Follow Up  HPI  History of DM: his A1C has been normal for over one year without medication.   He is on statin therapy No symptoms of hypoglycemia. His weight is finally stable, last level normal and we will not recheck it otday    HTN: he is off beta blocker, only on low dose Lisinopril , he has lost a lot of weight but stable now  , bp is at goal today    BPH: he was  under the care of Dr. Dow Adolph in [facility] but was switched to Urologist at [facility] - Dr. Laveda Norman - he had two previous appointments but first time he refused to leave the group home and the second time he tried got out of the [place] when they stopped at a stop sign. He has another appointment made for Feb 28 th  he states no longer having nocturia, he still masturbates often and causes penile irritation, he is complaining of pain today .  He is on Proscar and Flomax, last PSA was normal Dec 2022 , we will recheck PSA today    Dyslipidemia: taking statin and Lovaza. LDL is at goal . Reviewed labs done 09/23 and reviewed with caregiver  Bipolar disorder: seeing psychiatrist - Dr Ave Filter and currently off Prozac and titrating dose of Duloxetine. He is calm today.    Malnutrition: normal appetite Caregiver states he seems to have an normal appetite. His wait was in the 260 lbs back in 2019, 250 lbs in 2020's he was  to 231 lbs end of 2022 , down to 206 in 2023 and today is up to 210 lbs . He had EGD and colonoscopy that were unremarkable. Continue eating well and needs high protein intake. Low protein on labs , under the care of Dr. Cathie Hoops    Thrombocytopenia: seeing Dr. Cathie Hoops. Per her notes secondary to Abilify and carbamazepine. His Abilify dose is now 15 mg instead of 20 mg dose and will go back in March for follow up   OA knee: he was given knee braces by ortho but he does not seem to be wearing a brace  consistently, he has a mild antalgic gait . Unchanged   Patient Active Problem List   Diagnosis Date Noted   Thyroid mass 12/05/2022   Splenomegaly 10/28/2022   Primary osteoarthritis of both knees 01/16/2022   B12 deficiency 01/16/2022   Moderate protein-calorie malnutrition (HCC) 01/16/2022   Bilateral groin pain 12/10/2021   Abnormal CT scan, stomach    Polyp of colon    Osteoarthritis of knee 03/26/2020   Varicose veins, lower extremity, with inflammation, ulcerated, unspecified laterality (HCC) 05/26/2017   Bilateral lower extremity edema 04/14/2017   Thrombocytopenia (HCC) 07/20/2016   Intellectual disability 03/17/2016   Polypharmacy 01/10/2016   Benign prostatic hypertrophy without urinary obstruction 01/09/2015   Chronic kidney disease (CKD), stage I 01/09/2015   Cognitive decline 01/09/2015   History of diabetes mellitus 01/09/2015   GERD without esophagitis 01/09/2015   Hearing loss 01/09/2015   Dyslipidemia 01/09/2015   Morbid obesity, unspecified obesity type (HCC) 01/09/2015   Obstructive sleep apnea 01/09/2015   Other specified causes of urethral stricture 01/09/2015   Hernia of anterior abdominal wall 01/09/2015   BPH with obstruction/lower urinary tract symptoms 07/09/2012   Incomplete bladder emptying 07/09/2012   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  Benign essential HTN 02/04/2007   Bipolar I disorder, single manic episode, moderate (HCC) 02/04/2007    Past Surgical History:  Procedure Laterality Date   COLONOSCOPY WITH PROPOFOL N/A 11/19/2021   Procedure: COLONOSCOPY WITH PROPOFOL;  Surgeon: Toney Reil, MD;  Location[facility] ENDOSCOPY;  Service: Gastroenterology;  Laterality: N/A;   ESOPHAGOGASTRODUODENOSCOPY (EGD) WITH PROPOFOL N/A 11/19/2021   Procedure: ESOPHAGOGASTRODUODENOSCOPY (EGD) WITH PROPOFOL;  Surgeon: Toney Reil, MD;  Location: [facility] ENDOSCOPY;  Service: Gastroenterology;  Laterality: N/A;   LAPAROSCOPIC CHOLECYSTECTOMY      LAPAROSCOPY  12/03/2010   VENTRAL HERNIA REPAIR      Family History  Family history unknown: Yes    Social History   Tobacco Use   Smoking status: Never   Smokeless tobacco: Never  Substance Use Topics   Alcohol use: No    Alcohol/week: 0.0 standard drinks of alcohol     Current Outpatient Medications:    ABILIFY 15 MG tablet, Take 15 mg by mouth daily., Disp: , Rfl:    acetaminophen (TYLENOL) 500 MG tablet, Take 500 mg by mouth 3 (three) times daily., Disp: , Rfl:    antiseptic oral rinse (BIOTENE) LIQD, 15 mLs by Mouth Rinse route as needed for dry mouth., Disp: , Rfl:    atorvastatin (LIPITOR) 10 MG tablet, TAKE 1 TABLET BY MOUTH ONCE DAILY, Disp: 30 tablet, Rfl: 5   carbamazepine (EQUETRO) 200 MG CP12 12 hr capsule, Take 200 mg by mouth 2 (two) times daily., Disp: , Rfl:    carbamide peroxide (DEBROX) 6.5 % OTIC solution, PLACE 5 DROPS INTO BOTH EARS TWO TIMES A DAY, Disp: 15 mL, Rfl: 0   Cholecalciferol (VITAMIN D3) 1.25 MG (50000 UT) CAPS, Take 1 capsule by mouth every 14 (fourteen) days., Disp: , Rfl:    clonazePAM (KLONOPIN) 1 MG tablet, Take 1 mg by mouth daily., Disp: , Rfl:    Cyanocobalamin 1000 MCG/15ML LIQD, Take 5 mLs (333.3333 mcg total) by mouth daily., Disp: 450 mL, Rfl: 5   DENTA 5000 PLUS 1.1 % CREA dental cream, Take by mouth., Disp: , Rfl:    DULoxetine (CYMBALTA) 60 MG capsule, Take 60 mg by mouth daily., Disp: , Rfl:    Elastic Bandages & Supports (MEDICAL COMPRESSION SOCKS) MISC, 2 each by Does not apply route daily. Apply in am's and remove it at bedtime, Disp: 2 each, Rfl: 1   famotidine (PEPCID) 40 MG tablet, TAKE 1 TABLET BY MOUTH ONCE DAILY, Disp: 30 tablet, Rfl: 2   feeding supplement, ENSURE COMPLETE, (ENSURE COMPLETE) LIQD, Take 237 mLs by mouth 2 (two) times daily between meals., Disp: 237 mL, Rfl: 3   fexofenadine (ALLEGRA) 60 MG tablet, TAKE 1 TABLET BY MOUTH TWICE DAILY, Disp: 60 tablet, Rfl: 5   finasteride (PROSCAR) 5 MG tablet, Take 5 mg by  mouth daily., Disp: , Rfl:    fluticasone (FLONASE) 50 MCG/ACT nasal spray, Place 2 sprays into both nostrils daily., Disp: 16 g, Rfl: 6   gabapentin (NEURONTIN) 300 MG capsule, Take 300 mg by mouth in the morning., Disp: , Rfl:    gabapentin (NEURONTIN) 600 MG tablet, Take 600 mg by mouth at bedtime., Disp: , Rfl:    ketoconazole (NIZORAL) 2 % cream, APPLY TOPICALLY 1 APPLICATION TO GROIN RASH ONCE DAILY, Disp: 60 g, Rfl: 11   lisinopril (ZESTRIL) 5 MG tablet, TAKE 1 TABLET BY MOUTH ONCE DAILY, Disp: 90 tablet, Rfl: 0   Lubricants (K-Y JELLY) GEL, USE  AS NEEDED TO BE ABLE TO  MASTURBATE, Disp: 57 g, Rfl: 10   meloxicam (MOBIC) 15 MG tablet, Take 1 tablet (15 mg total) by mouth daily as needed for pain. Please don't give it daily, Disp: 30 tablet, Rfl: 5   nystatin cream (MYCOSTATIN), Apply topically 2 (two) times daily., Disp: 30 g, Rfl: 10   omega-3 acid ethyl esters (LOVAZA) 1 g capsule, TAKE 1 CAPSULE BY MOUTH TWICE DAILY  *DO NOT CRUSH OR CHEW*, Disp: 14 capsule, Rfl: 10   omeprazole (PRILOSEC) 20 MG capsule, TAKE 1 CAPSULE BY MOUTH ONCE DAILY *DO NOT CRUSH OR CHEW*, Disp: 7 capsule, Rfl: 10   ondansetron (ZOFRAN) 4 MG tablet, TAKE 1 TABLET BY MOUTH EVERY EIGHT HOURS AS NEEDED FOR NAUSEA / VOMITING, Disp: 30 tablet, Rfl: 11   Polyethyl Glycol-Propyl Glycol (SYSTANE) 0.4-0.3 % SOLN, Apply to eye., Disp: , Rfl:    tamsulosin (FLOMAX) 0.4 MG CAPS capsule, TAKE 1 CAPSULE BY MOUTH ONCE DAILY  *DO NOT CRUSH OR CHEW* **DO NOT OPEN CAPSULE**, Disp: 7 capsule, Rfl: 10   triamcinolone cream (KENALOG) 0.1 %, Apply 1 Application topically 2 (two) times daily. Use for 7 days and follow up if no resolution - right arm, Disp: 30 g, Rfl: 0   Vitamin D, Ergocalciferol, (DRISDOL) 1.25 MG (50000 UNIT) CAPS capsule, Take 50,000 Units by mouth every 14 (fourteen) days., Disp: , Rfl:    Vitamins A & D (A+D PREVENT) OINT ointment, APPLY TOPICALLY AS NEEDED FOR DRY SKIN, Disp: 113 g, Rfl: 11   Zoster Vac Recomb  Adjuvanted (SHINGRIX IM), Inject into the muscle., Disp: , Rfl:   Current Facility-Administered Medications:    nystatin (MYCOSTATIN/NYSTOP) topical powder, , Topical, PRN, Alba Cory, MD  No Known Allergies  I personally reviewed active problem list, medication list, allergies, family history, social history, health maintenance with the patient/caregiver today.   ROS  ***  Objective  There were no vitals filed for this visit.  There is no height or weight on file to calculate BMI.  Physical Exam ***   PHQ2/9:    08/11/2022    8:31 AM 07/10/2022   11:05 AM 04/08/2022    1:47 PM 01/16/2022   10:21 AM 10/03/2021    9:53 AM  Depression screen PHQ 2/9  Decreased Interest 0 0 0 1 0  Down, Depressed, Hopeless 0 0 0 0 0  PHQ - 2 Score 0 0 0 1 0  Altered sleeping 0 0 0  0  Tired, decreased energy 0 0 0  0  Change in appetite 0 0 0  0  Feeling bad or failure about yourself  0 0 0  0  Trouble concentrating 0 0 0  0  Moving slowly or fidgety/restless 0 0 0  0  Suicidal thoughts 0 0 0  0  PHQ-9 Score 0 0 0  0  Difficult doing work/chores     Not difficult at all    phq 9 is {gen pos [id number]}   Fall Risk:    08/11/2022    8:31 AM 07/10/2022   11:08 AM 04/08/2022    1:47 PM 01/16/2022   10:21 AM 10/03/2021    9:52 AM  Fall Risk   Falls in the past year? 0 0 0 0 0  Number falls in past yr: 0   0 0  Injury with Fall? 0   0 0  Risk for fall due to : No Fall Risks No Fall Risks No Fall Risks No Fall Risks No Fall Risks  Follow  up Falls prevention discussed Education provided;Falls prevention discussed Falls prevention discussed;Education provided;Falls evaluation completed Falls prevention discussed Falls prevention discussed      Functional Status Survey:      Assessment & Plan  *** There are no diagnoses linked to this encounter.

## 2022-12-10 ENCOUNTER — Ambulatory Visit: Payer: Medicare Other | Admitting: Family Medicine

## 2022-12-15 ENCOUNTER — Ambulatory Visit: Payer: Medicare Other | Attending: Oncology

## 2022-12-15 ENCOUNTER — Ambulatory Visit: Payer: Medicare Other | Admitting: Podiatry

## 2022-12-18 NOTE — Progress Notes (Deleted)
No show

## 2022-12-19 ENCOUNTER — Ambulatory Visit: Payer: Medicare Other | Admitting: Family Medicine

## 2022-12-25 ENCOUNTER — Ambulatory Visit (INDEPENDENT_AMBULATORY_CARE_PROVIDER_SITE_OTHER): Payer: Medicare Other | Admitting: Podiatry

## 2022-12-25 DIAGNOSIS — M79674 Pain in right toe(s): Secondary | ICD-10-CM | POA: Diagnosis not present

## 2022-12-25 DIAGNOSIS — M79675 Pain in left toe(s): Secondary | ICD-10-CM

## 2022-12-25 DIAGNOSIS — Z8639 Personal history of other endocrine, nutritional and metabolic disease: Secondary | ICD-10-CM

## 2022-12-25 DIAGNOSIS — D696 Thrombocytopenia, unspecified: Secondary | ICD-10-CM

## 2022-12-25 DIAGNOSIS — B351 Tinea unguium: Secondary | ICD-10-CM

## 2022-12-25 NOTE — Progress Notes (Signed)
  Subjective:  Patient ID: Wayne Jones, male    DOB: October 24, 1960,  MRN: [id number]  Saul Fordyce presents to clinic today for:  Chief Complaint  Patient presents with   Nail Problem    DFC,A1C:4.7,Referring Provider Alba Cory, MD,LOV:01/24    Caregiver present with patient.  PCP is Alba Cory, MD.  No Known Allergies  Review of Systems: Negative except as noted in the HPI.  Objective: No changes noted in today's physical examination. There were no vitals filed for this visit.  Wayne Jones is a pleasant 62 y.o. male in NAD. AAO x 3.  Vascular Examination: Capillary refill time <3 seconds b/l LE. Palpable pedal pulses b/l LE. Digital hair present b/l. No pedal edema b/l. Skin temperature gradient WNL b/l. No varicosities b/l. Marland Kitchen  Dermatological Examination: Pedal skin with normal turgor, texture and tone b/l. No open wounds. No interdigital macerations b/l. Toenails 2-5 b/l thickened, discolored, dystrophic with subungual debris. There is pain on palpation to dorsal aspect of nailplates. Anonychia noted bilateral great toes. Nailbed(s) epithelialized.  Hyperkeratotic lesion(s) submet head 1 b/l.  No erythema, no edema, no drainage, no fluctuance..  Neurological Examination: Protective sensation intact with 10 gram monofilament b/l LE. Vibratory sensation intact b/l LE.   Musculoskeletal Examination: Muscle strength 5/5 to all LE muscle groups b/l. No pain, crepitus or joint limitation noted with ROM bilateral LE. Hammertoe deformity noted 2-5 b/l. Patient ambulates independent of any assistive aids.  Assessment/Plan: 1. Pain due to onychomycosis of toenails of both feet   2. History of diabetes mellitus     -Caregiver/provider present with patient on today's visit. -Continue foot and shoe inspections daily. Monitor blood glucose per PCP/Endocrinologist's recommendations. -Patient to continue soft, supportive shoe gear daily. -Toenails were debrided in  length and girth 2-5 bilaterally with sterile nail nippers and dremel without iatrogenic bleeding.  -Patient/POA to call should there be question/concern in the interim.   Return in about 6 months (around 06/27/2023).  Freddie Breech, DPM

## 2022-12-29 ENCOUNTER — Encounter: Payer: Self-pay | Admitting: Podiatry

## 2023-01-07 ENCOUNTER — Other Ambulatory Visit: Payer: Self-pay | Admitting: Family Medicine

## 2023-01-08 ENCOUNTER — Other Ambulatory Visit: Payer: Self-pay

## 2023-01-08 ENCOUNTER — Emergency Department (HOSPITAL_COMMUNITY)
Admission: EM | Admit: 2023-01-08 | Discharge: 2023-01-08 | Disposition: A | Discharge status: Home / Self Care | Payer: Medicare Other | Attending: Emergency Medicine | Admitting: Emergency Medicine

## 2023-01-08 ENCOUNTER — Encounter (HOSPITAL_COMMUNITY): Payer: Self-pay

## 2023-01-08 ENCOUNTER — Emergency Department (HOSPITAL_COMMUNITY): Payer: Medicare Other

## 2023-01-08 DIAGNOSIS — E119 Type 2 diabetes mellitus without complications: Secondary | ICD-10-CM | POA: Insufficient documentation

## 2023-01-08 DIAGNOSIS — R079 Chest pain, unspecified: Secondary | ICD-10-CM | POA: Diagnosis not present

## 2023-01-08 DIAGNOSIS — F840 Autistic disorder: Secondary | ICD-10-CM | POA: Insufficient documentation

## 2023-01-08 DIAGNOSIS — R102 Pelvic and perineal pain: Secondary | ICD-10-CM | POA: Diagnosis not present

## 2023-01-08 DIAGNOSIS — S0001XA Abrasion of scalp, initial encounter: Secondary | ICD-10-CM | POA: Diagnosis not present

## 2023-01-08 DIAGNOSIS — R29810 Facial weakness: Secondary | ICD-10-CM | POA: Diagnosis not present

## 2023-01-08 DIAGNOSIS — N189 Chronic kidney disease, unspecified: Secondary | ICD-10-CM | POA: Insufficient documentation

## 2023-01-08 DIAGNOSIS — R29818 Other symptoms and signs involving the nervous system: Secondary | ICD-10-CM | POA: Diagnosis not present

## 2023-01-08 DIAGNOSIS — S0990XA Unspecified injury of head, initial encounter: Secondary | ICD-10-CM | POA: Diagnosis not present

## 2023-01-08 DIAGNOSIS — R0602 Shortness of breath: Secondary | ICD-10-CM | POA: Diagnosis not present

## 2023-01-08 DIAGNOSIS — R4781 Slurred speech: Secondary | ICD-10-CM | POA: Diagnosis not present

## 2023-01-08 DIAGNOSIS — R197 Diarrhea, unspecified: Secondary | ICD-10-CM | POA: Diagnosis not present

## 2023-01-08 DIAGNOSIS — Z79899 Other long term (current) drug therapy: Secondary | ICD-10-CM | POA: Insufficient documentation

## 2023-01-08 DIAGNOSIS — W19XXXA Unspecified fall, initial encounter: Secondary | ICD-10-CM

## 2023-01-08 DIAGNOSIS — S0101XA Laceration without foreign body of scalp, initial encounter: Secondary | ICD-10-CM | POA: Diagnosis not present

## 2023-01-08 DIAGNOSIS — I10 Essential (primary) hypertension: Secondary | ICD-10-CM | POA: Diagnosis not present

## 2023-01-08 DIAGNOSIS — I129 Hypertensive chronic kidney disease with stage 1 through stage 4 chronic kidney disease, or unspecified chronic kidney disease: Secondary | ICD-10-CM | POA: Insufficient documentation

## 2023-01-08 DIAGNOSIS — W182XXA Fall in (into) shower or empty bathtub, initial encounter: Secondary | ICD-10-CM | POA: Diagnosis not present

## 2023-01-08 DIAGNOSIS — R0902 Hypoxemia: Secondary | ICD-10-CM | POA: Diagnosis not present

## 2023-01-08 DIAGNOSIS — R58 Hemorrhage, not elsewhere classified: Secondary | ICD-10-CM | POA: Diagnosis not present

## 2023-01-08 LAB — URINALYSIS, ROUTINE W REFLEX MICROSCOPIC
Bilirubin Urine: NEGATIVE
Glucose, UA: NEGATIVE mg/dL
Hgb urine dipstick: NEGATIVE
Ketones, ur: NEGATIVE mg/dL
Leukocytes,Ua: NEGATIVE
Nitrite: NEGATIVE
Protein, ur: NEGATIVE mg/dL
Specific Gravity, Urine: 1.008 (ref 1.005–1.030)
pH: 7 (ref 5.0–8.0)

## 2023-01-08 LAB — COMPREHENSIVE METABOLIC PANEL
ALT: 20 U/L (ref 0–44)
AST: 21 U/L (ref 15–41)
Albumin: 3.6 g/dL (ref 3.5–5.0)
Alkaline Phosphatase: 66 U/L (ref 38–126)
Anion gap: 7 (ref 5–15)
BUN: 10 mg/dL (ref 8–23)
CO2: 26 mmol/L (ref 22–32)
Calcium: 8.6 mg/dL — ABNORMAL LOW (ref 8.9–10.3)
Chloride: 102 mmol/L (ref 98–111)
Creatinine, Ser: 0.64 mg/dL (ref 0.61–1.24)
GFR, Estimated: >60 mL/min (ref >60)
Glucose, Bld: 101 mg/dL — ABNORMAL HIGH (ref 70–99)
Potassium: 3.9 mmol/L (ref 3.5–5.1)
Sodium: 135 mmol/L (ref 135–145)
Total Bilirubin: 0.7 mg/dL (ref 0.3–1.2)
Total Protein: 5.8 g/dL — ABNORMAL LOW (ref 6.5–8.1)

## 2023-01-08 LAB — CBC WITH DIFFERENTIAL/PLATELET
Abs Immature Granulocytes: 0.01 10*3/uL (ref 0.00–0.07)
Basophils Absolute: 0 10*3/uL (ref 0.0–0.1)
Basophils Relative: 1 %
Eosinophils Absolute: 0.1 10*3/uL (ref 0.0–0.5)
Eosinophils Relative: 2 %
HCT: 36.9 % — ABNORMAL LOW (ref 39.0–52.0)
Hemoglobin: 13.4 g/dL (ref 13.0–17.0)
Immature Granulocytes: 0 %
Lymphocytes Relative: 20 %
Lymphs Abs: 0.9 10*3/uL (ref 0.7–4.0)
MCH: 33.8 pg (ref 26.0–34.0)
MCHC: 36.3 g/dL — ABNORMAL HIGH (ref 30.0–36.0)
MCV: 93.2 fL (ref 80.0–100.0)
Monocytes Absolute: 0.4 10*3/uL (ref 0.1–1.0)
Monocytes Relative: 8 %
Neutro Abs: 3.1 10*3/uL (ref 1.7–7.7)
Neutrophils Relative %: 69 %
Platelets: 104 10*3/uL — ABNORMAL LOW (ref 150–400)
RBC: 3.96 MIL/uL — ABNORMAL LOW (ref 4.22–5.81)
RDW: 12.2 % (ref 11.5–15.5)
WBC: 4.4 10*3/uL (ref 4.0–10.5)
nRBC: 0 % (ref 0.0–0.2)

## 2023-01-08 LAB — TROPONIN I (HIGH SENSITIVITY)
Troponin I (High Sensitivity): 35 ng/L — ABNORMAL HIGH (ref <18)
Troponin I (High Sensitivity): 30 ng/L — ABNORMAL HIGH (ref <18)

## 2023-01-08 MED ORDER — LORAZEPAM 1 MG PO TABS
1.0000 mg | ORAL_TABLET | Freq: Once | ORAL | Status: AC | PRN
  Administered 2023-01-08: 1 mg via ORAL
  Filled 2023-01-08: qty 1

## 2023-01-08 NOTE — ED Provider Notes (Signed)
[place] EMERGENCY DEPARTMENT AT [facility] Provider Note   CSN: [id number] Arrival date & time: 01/08/23  4098     History  Chief Complaint  Patient presents with   Facial Droop   Aphasia    LKW 01/07/2023 2100    Wayne Jones is a 62 y.o. male.  Patient is a 62 year old male with a past medical history of autism and intellectual delay, hypertension, diabetes, CKD presenting to the emergency department with facial droop after a fall.  Patient lives in a group home and per EMS the patient fell around 7 PM last night and went to bed around 9 PM.  He did hit the back of his head and sustained a small laceration.  The patient woke up this morning he was noted to have a left-sided facial droop and slurred speech which prompted him to call 911.  Patient does report feeling dizzy prior to the fall yesterday and had some chest pain and shortness of breath.  He is unable to further elaborate with his intellectual delay.  He denies any nausea or vomiting.  He did report some diarrhea and denies any black or bloody stools.  He states that he is having a mild headache.  Patient is not on any blood thinners.  The history is provided by the patient, the EMS personnel and medical records. The history is limited by a developmental delay.       Home Medications Prior to Admission medications   Medication Sig Start Date End Date Taking? Authorizing Provider  ABILIFY 15 MG tablet Take 15 mg by mouth daily. 03/24/22   [provider]  acetaminophen (TYLENOL) 500 MG tablet Take 500 mg by mouth 3 (three) times daily.    [provider]  antiseptic oral rinse (BIOTENE) LIQD 15 mLs by Mouth Rinse route as needed for dry mouth.    [provider]  atorvastatin (LIPITOR) 10 MG tablet TAKE 1 TABLET BY MOUTH ONCE DAILY 10/08/22   Alba Cory, MD  carbamazepine (EQUETRO) 200 MG CP12 12 hr capsule Take 200 mg by mouth 2 (two) times daily.    [provider]   carbamide peroxide (DEBROX) 6.5 % OTIC solution PLACE 5 DROPS INTO BOTH EARS TWO TIMES A DAY 10/13/18   Alba Cory, MD  Cholecalciferol (VITAMIN D3) 1.25 MG (50000 UT) CAPS Take 1 capsule by mouth every 14 (fourteen) days.    [provider]  clonazePAM (KLONOPIN) 1 MG tablet Take 1 mg by mouth daily.    Antonietta Breach, MD  Cyanocobalamin 1000 MCG/15ML LIQD Take 5 mLs (333.3333 mcg total) by mouth daily. 03/26/21   Alba Cory, MD  DENTA 5000 PLUS 1.1 % CREA dental cream Take by mouth. 01/27/20   [provider]  DULoxetine (CYMBALTA) 60 MG capsule Take 60 mg by mouth daily.    [provider]  Elastic Bandages & Supports (MEDICAL COMPRESSION SOCKS) MISC 2 each by Does not apply route daily. Apply in am's and remove it at bedtime 09/04/21   Margarita Mail, DO  famotidine (PEPCID) 40 MG tablet TAKE 1 TABLET BY MOUTH ONCE DAILY 01/07/23   Alba Cory, MD  feeding supplement, ENSURE COMPLETE, (ENSURE COMPLETE) LIQD Take 237 mLs by mouth 2 (two) times daily between meals. 04/02/21   Alba Cory, MD  fexofenadine (ALLEGRA) 60 MG tablet TAKE 1 TABLET BY MOUTH TWICE DAILY 10/08/22   Margarita Mail, DO  finasteride (PROSCAR) 5 MG tablet Take 5 mg by mouth daily.  [provider]  fluticasone (FLONASE) 50 MCG/ACT nasal spray Place 2 sprays into both nostrils daily. 09/04/21   Margarita Mail, DO  gabapentin (NEURONTIN) 300 MG capsule Take 300 mg by mouth in the morning.    [provider]  gabapentin (NEURONTIN) 600 MG tablet Take 600 mg by mouth at bedtime.    [provider]  ketoconazole (NIZORAL) 2 % cream APPLY TOPICALLY 1 APPLICATION TO GROIN RASH ONCE DAILY 02/18/22   Carlynn Purl, Danna Hefty, MD  lisinopril (ZESTRIL) 5 MG tablet TAKE 1 TABLET BY MOUTH ONCE DAILY 11/25/22   Carlynn Purl, Danna Hefty, MD  Lubricants (K-Y JELLY) GEL USE  AS NEEDED TO BE ABLE TO MASTURBATE 01/29/21   Carlynn Purl, Danna Hefty, MD  meloxicam (MOBIC) 15 MG tablet Take 1 tablet  (15 mg total) by mouth daily as needed for pain. Please don't give it daily 03/06/21   Alba Cory, MD  nystatin cream (MYCOSTATIN) Apply topically 2 (two) times daily. 06/03/22   Sowles, Danna Hefty, MD  omega-3 acid ethyl esters (LOVAZA) 1 g capsule TAKE 1 CAPSULE BY MOUTH TWICE DAILY  *DO NOT CRUSH OR CHEW* 09/30/22   Carlynn Purl, Danna Hefty, MD  omeprazole (PRILOSEC) 20 MG capsule TAKE 1 CAPSULE BY MOUTH ONCE DAILY *DO NOT CRUSH OR CHEW* 09/30/22   Carlynn Purl, Danna Hefty, MD  ondansetron (ZOFRAN) 4 MG tablet TAKE 1 TABLET BY MOUTH EVERY EIGHT HOURS AS NEEDED FOR NAUSEA / VOMITING 05/14/20   Alba Cory, MD  Polyethyl Glycol-Propyl Glycol (SYSTANE) 0.4-0.3 % SOLN Apply to eye.    [provider]  tamsulosin (FLOMAX) 0.4 MG CAPS capsule TAKE 1 CAPSULE BY MOUTH ONCE DAILY  *DO NOT CRUSH OR CHEW* **DO NOT OPEN CAPSULE** 09/30/22   Carlynn Purl, Danna Hefty, MD  triamcinolone cream (KENALOG) 0.1 % Apply 1 Application topically 2 (two) times daily. Use for 7 days and follow up if no resolution - right arm 04/08/22   Alba Cory, MD  Vitamin D, Ergocalciferol, (DRISDOL) 1.25 MG (50000 UNIT) CAPS capsule Take 50,000 Units by mouth every 14 (fourteen) days. 03/13/22   [provider]  Vitamins A & D (A+D PREVENT) OINT ointment APPLY TOPICALLY AS NEEDED FOR DRY SKIN 09/09/21   Alba Cory, MD  Zoster Vac Recomb Adjuvanted [facility] IM) Inject into the muscle.    [provider]      Allergies    Patient has no known allergies.    Review of Systems   Review of Systems  Physical Exam Updated Vital Signs BP (!) 137/90   Pulse 63   Temp (!) 97.5 F (36.4 C) (Oral)   Resp 12   Ht 6\' 3"  (1.905 m)   Wt 99.8 kg   SpO2 100%   BMI 27.50 kg/m  Physical Exam Vitals and nursing note reviewed. Exam conducted with a chaperone present Radio producer).  Constitutional:      General: He is not in acute distress.    Appearance: Normal appearance.  HENT:     Head: Normocephalic.     Comments:  Approximately 1.5 cm laceration to the posterior scalp, nongaping, nonbleeding    Nose: Nose normal.     Mouth/Throat:     Mouth: Mucous membranes are moist.     Pharynx: Oropharynx is clear.  Eyes:     Extraocular Movements: Extraocular movements intact.     Conjunctiva/sclera: Conjunctivae normal.     Pupils: Pupils are equal, round, and reactive to light.  Neck:     Comments: No midline neck tenderness Cardiovascular:  Rate and Rhythm: Normal rate and regular rhythm.     Heart sounds: Normal heart sounds.  Pulmonary:     Effort: Pulmonary effort is normal.     Breath sounds: Normal breath sounds.  Abdominal:     General: Abdomen is flat.     Palpations: Abdomen is soft.     Tenderness: There is no abdominal tenderness.  Genitourinary:    Penis: Normal.      Testes: Normal.  Musculoskeletal:        General: Normal range of motion.     Cervical back: Normal range of motion and neck supple.     Comments: No midline back tenderness, no bony tenderness to bilateral upper or lower extremities, mild tenderness to right hip, pelvis stable, internal/external rotation intact bilaterally without pain  Skin:    General: Skin is warm and dry.     Findings: No bruising.  Neurological:     Mental Status: He is alert and oriented to person, place, and time.     Comments: Left-sided facial droop, dysarthria Facial sensation intact, sensation intact in all 4 extremities No drift in all 4 extremities, normal strength in all 4 extremities Normal finger-to-nose bilaterally  Psychiatric:        Mood and Affect: Mood normal.        Behavior: Behavior normal.     ED Results / Procedures / Treatments   Labs (all labs ordered are listed, but only abnormal results are displayed) Labs Reviewed  COMPREHENSIVE METABOLIC PANEL - Abnormal; Notable for the following components:      Result Value   Glucose, Bld 101 (*)    Calcium 8.6 (*)    Total Protein 5.8 (*)    All other components within  normal limits  CBC WITH DIFFERENTIAL/PLATELET - Abnormal; Notable for the following components:   RBC 3.96 (*)    HCT 36.9 (*)    MCHC 36.3 (*)    Platelets 104 (*)    All other components within normal limits  URINALYSIS, ROUTINE W REFLEX MICROSCOPIC - Abnormal; Notable for the following components:   Color, Urine STRAW (*)    All other components within normal limits  TROPONIN I (HIGH SENSITIVITY) - Abnormal; Notable for the following components:   Troponin I (High Sensitivity) 35 (*)    All other components within normal limits  TROPONIN I (HIGH SENSITIVITY) - Abnormal; Notable for the following components:   Troponin I (High Sensitivity) 30 (*)    All other components within normal limits    EKG EKG Interpretation  Date/Time:  Thursday January 08 2023 09:28:09 EDT Ventricular Rate:  65 PR Interval:  191 QRS Duration: 152 QT Interval:  469 QTC Calculation: 488 R Axis:   -62 Text Interpretation: Sinus rhythm RBBB and LAFB Left ventricular hypertrophy Lateral infarct, age indeterminate RBBB new compared to prior EKG 10 years ago Confirmed by Elayne Snare ([id number]) on 01/08/2023 9:32:21 AM  Radiology MR BRAIN WO CONTRAST  Result Date: 01/08/2023 CLINICAL DATA:  Neuro deficit, acute, stroke suspected. EXAM: MRI HEAD WITHOUT CONTRAST TECHNIQUE: Multiplanar, multiecho pulse sequences of the brain and surrounding structures were obtained without intravenous contrast. COMPARISON:  Head CT 01/08/2023. FINDINGS: Brain: No acute infarct or hemorrhage. No mass or midline shift. No hydrocephalus or extra-axial collection. No abnormal susceptibility. Vascular: Normal flow voids. Skull and upper cervical spine: Normal marrow signal. Disc extrusion at C3-4 results in at least moderate spinal canal stenosis. Sinuses/Orbits: Unremarkable. Other: None. IMPRESSION: 1. No acute intracranial abnormality  or mass. 2. Disc extrusion at C3-4 results in at least moderate spinal canal stenosis. Electronically  Signed   By: Orvan Falconer M.D.   On: 01/08/2023 14:16   CT Head Wo Contrast  Result Date: 01/08/2023 CLINICAL DATA:  Head trauma, focal neuro findings (Age 91-64y) EXAM: CT HEAD WITHOUT CONTRAST TECHNIQUE: Contiguous axial images were obtained from the base of the skull through the vertex without intravenous contrast. RADIATION DOSE REDUCTION: This exam was performed according to the departmental dose-optimization program which includes automated exposure control, adjustment of the mA and/or kV according to patient size and/or use of iterative reconstruction technique. COMPARISON:  None Available. FINDINGS: Brain: Remote left PCA territory infarct. No evidence of acute large vascular territory infarct, acute hemorrhage, mass lesion, midline shift or hydrocephalus. Vascular: No hyperdense vessel. Skull: No acute fracture. Sinuses/Orbits: Clear sinuses.  No acute findings. IMPRESSION: 1. No evidence of acute intracranial abnormality. 2. Remote left PCA territory infarct. Electronically Signed   By: Feliberto Harts M.D.   On: 01/08/2023 11:43   DG Pelvis 1-2 Views  Result Date: 01/08/2023 CLINICAL DATA:  Pain after fall EXAM: PELVIS - 1 VIEW COMPARISON:  03/07/2010 x-ray FINDINGS: No fracture or dislocation. Preserved joint spaces and bone mineralization. Hyperostosis. There are some well rounded densities in the pelvis which are indeterminate although possibly vascular. IMPRESSION: No acute osseous abnormality Electronically Signed   By: Karen Kays M.D.   On: 01/08/2023 11:28   DG Chest 2 View  Result Date: 01/08/2023 CLINICAL DATA:  Pain after fall EXAM: CHEST - 2 VIEW COMPARISON:  X-ray 09/03/2011.  Chest CT 11/10/2022 FINDINGS: No consolidation, pneumothorax or effusion. No edema. Normal cardiopericardial silhouette. Overlapping cardiac leads. Film is rotated to the left. Degenerative changes seen of the spine on lateral view. IMPRESSION: No acute cardiopulmonary disease Electronically Signed   By:  Karen Kays M.D.   On: 01/08/2023 11:28    Procedures Procedures    Medications Ordered in ED Medications  LORazepam (ATIVAN) tablet 1 mg (1 mg Oral Given 01/08/23 1254)    ED Course/ Medical Decision Making/ A&P Clinical Course as of 01/08/23 1438  Thu Jan 08, 2023  1034 Patient's caregiver at bedside. Reports she was not with him last night when he fell but did report he was saying that he was feeling generally unwell yesterday. She reports that he does have slurred speech at baseline and his speech sounded normal to her today. She is unsure if the facial droop is new. She states he does commonly complain of genital pain but has had normal work ups in the past.  [VK]  1152 Old appearing infarct on CTH, otherwise no acute abnormalities. Initial trop mildly elevated 35, repeat is pending. [VK]  1228 Caregiver reports he does appear to have somewhat of a facial droop compared to normal and will have MRI to evaluate for acute infarct. [VK]  1247 Repeat troponin downtrending. [VK]  1420 MRI without acute abnormality, moderate spinal stenosis. He is stable for discharge home with outpatient follow up and was given strict return precautions. [VK]    Clinical Course User Index [VK] Rexford Maus, DO                             Medical Decision Making This patient presents to the ED with chief complaint(s) of fall, facial droop with pertinent past medical history of autism with intellectual delay, HTN, DM, CKD which further complicates the  presenting complaint. The complaint involves an extensive differential diagnosis and also carries with it a high risk of complications and morbidity.    The differential diagnosis includes considering ICH, mass effect, CVA, TIA, electrolyte abnormality, ACS, arrhythmia, anemia, dehydration, considering hip or pelvis fracture, no other traumatic injuries seen on exam  Additional history obtained: Additional history obtained from EMS  Records reviewed  Nursing Home Documents  ED Course and Reassessment: On patient's arrival to the emergency department he is hemodynamically stable in no acute distress.  He does have a notable left-sided facial droop and slurred speech.  Patient's last known well was around 9 PM last night and he will undergo subacute stroke workup as well as trauma workup for possible cause of his new neurologic deficits.  Due to possible syncopal fall he will additionally have EKG and labs performed and he will be closely reassessed.  Per records Tdap was last updated in 2019 so he does not require an update today.  Independent labs interpretation:  The following labs were independently interpreted: within normal range  Independent visualization of imaging: - I independently visualized the following imaging with scope of interpretation limited to determining acute life threatening conditions related to emergency care: CTH/MRI brain, which revealed no acute abnormality  Consultation: - Consulted or discussed management/test interpretation w/ external professional: N/A  Consideration for admission or further workup: Patient has no emergent conditions requiring admission or further work-up at this time and is stable for discharge home with primary care follow-up  Social Determinants of health: N/A    Amount and/or Complexity of Data Reviewed Labs: ordered. Radiology: ordered.  Risk Prescription drug management.          Final Clinical Impression(s) / ED Diagnoses Final diagnoses:  Fall, initial encounter  Abrasion of scalp, initial encounter  Injury of head, initial encounter    Rx / DC Orders ED Discharge Orders     None         Rexford Maus, DO 01/08/23 1438

## 2023-01-08 NOTE — ED Triage Notes (Signed)
From group home via EMS with c/o stroke symptoms. Hx of autism. Caregivers report fall in shower with posterior head strike at 1900 on 6/5, unknown LOC. Pt was okay when he went to bed at 2100. Staff of group home noticed left facial droop and slurred speech when he woke up at 0600 and called EMS. VSS. If patient is discharged he will be picked up by Chrissie Noa who can be reached at [phone].

## 2023-01-08 NOTE — Discharge Instructions (Signed)
You were seen in the emergency department after your head injury and possible facial droop.  Your workup showed no signs of stroke or bleeding in your brain and no obvious cause of your fall.  You did have a small abrasion to the back of your scalp that does not require any stitches.  You can follow-up with your primary doctor to have your symptoms rechecked.  You should return to the emergency department if you are having worsening numbness or weakness on one side of the body compared to the other, if you have severe headaches, you have repetitive vomiting or if you have any other new or concerning symptoms.

## 2023-01-08 NOTE — ED Notes (Signed)
Patient to MRI at this time.

## 2023-01-13 ENCOUNTER — Telehealth: Payer: Self-pay

## 2023-01-13 NOTE — Telephone Encounter (Signed)
Transition Care Management Unsuccessful Follow-up Telephone Call  Date of discharge and from where:  [facility] 6[facility]  Attempts:  1st Attempt  Reason for unsuccessful TCM follow-up call:  Unable to reach patient   Lenard Forth [facility] Guide, [facility] [phone] [address] [place], [place], [place] [address] Phone: [phone] Email: [email or web address]

## 2023-01-14 ENCOUNTER — Telehealth: Payer: Self-pay

## 2023-01-14 NOTE — Telephone Encounter (Signed)
Transition Care Management Unsuccessful Follow-up Telephone Call  Date of discharge and from where:  [facility] 6/[facility]  Attempts:  2nd Attempt  Reason for unsuccessful TCM follow-up call:  Unable to leave message   Lenard Forth [facility] Guide, [facility] [phone] [address] [place], [place], [place] [address] Phone: [phone] Email: [email or web address]

## 2023-03-10 ENCOUNTER — Other Ambulatory Visit: Payer: Self-pay | Admitting: Family Medicine

## 2023-03-10 DIAGNOSIS — F3489 Other specified persistent mood disorders: Secondary | ICD-10-CM | POA: Diagnosis not present

## 2023-04-08 NOTE — Progress Notes (Unsigned)
Name: Wayne Jones   MRN: [id number]    DOB: Oct 14, 1960   Date:04/09/2023       Progress Note  Subjective  Chief Complaint  FL2  He came in today with caregiver from [facility]   HPI  History of DM: his A1C has been normal without mediations for over one year He is on statin therapy No symptoms of hypoglycemia. His weight is still trending down but at slower pace . He follows a low carbohydrate diet at group ome    HTN: he is  only on low dose ACe and tolerating medication well. BP is at goal    BPH: he is now seeing Dr. Laveda Norman .  Nocturia has improved, he  still masturbates often and causes penile irritation but doing better since he has been using lotion  He is on Proscar and Flomax, last PSA was normal 08/2022    Dyslipidemia: taking statin and Lovaza.We will recheck labs today   Bipolar disorder: seeing psychiatrist - Dr Ave Filter he takes Abilify ,  Duloxetine and gabapentin. He is compliant due to living in a nursing home. He wanders at times but no other behavior problems.    Malnutrition:  Caregiver states he seems to have an normal appetite. His weight  was in the 260 lbs back in 2019, 250 lbs in 2020's he was down  to 231 lbs end of 2022 , down to 206 in 2023 it went up to 210 early 2024 and is now down to 203 lbs . He had EGD and colonoscopy that were unremarkable. He is seeing Dr. Cathie Hoops    Thrombocytopenia: seeing Dr. Cathie Hoops. Per her notes secondary to Abilify and carbamazepine. He is on lower dose of Abilify now , last platelets 104    OA knee: he was given knee braces by ortho but he does not seem to be wearing a brace consistently, we added meloxicam but he continues to have discomfort and antalgic pain . We will send him back to Ortho   Patient Active Problem List   Diagnosis Date Noted   Thyroid mass 12/05/2022   Splenomegaly 10/28/2022   Primary osteoarthritis of both knees 01/16/2022   B12 deficiency 01/16/2022   Moderate protein-calorie malnutrition (HCC) 01/16/2022    Bilateral groin pain 12/10/2021   Abnormal CT scan, stomach    Polyp of colon    Osteoarthritis of knee 03/26/2020   Varicose veins, lower extremity, with inflammation, ulcerated, unspecified laterality (HCC) 05/26/2017   Bilateral lower extremity edema 04/14/2017   Thrombocytopenia (HCC) 07/20/2016   Intellectual disability 03/17/2016   Polypharmacy 01/10/2016   Benign prostatic hypertrophy without urinary obstruction 01/09/2015   Chronic kidney disease (CKD), stage I 01/09/2015   Cognitive decline 01/09/2015   History of diabetes mellitus 01/09/2015   GERD without esophagitis 01/09/2015   Hearing loss 01/09/2015   Dyslipidemia 01/09/2015   Morbid obesity, unspecified obesity type (HCC) 01/09/2015   Obstructive sleep apnea 01/09/2015   Other specified causes of urethral stricture 01/09/2015   Hernia of anterior abdominal wall 01/09/2015   BPH with obstruction/lower urinary tract symptoms 07/09/2012   Incomplete bladder emptying 07/09/2012   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   Benign essential HTN 02/04/2007   Bipolar I disorder, single manic episode, moderate (HCC) 02/04/2007    Past Surgical History:  Procedure Laterality Date   COLONOSCOPY WITH PROPOFOL N/A 11/19/2021   Procedure: COLONOSCOPY WITH PROPOFOL;  Surgeon: Toney Reil, MD;  Location: [facility] ENDOSCOPY;  Service: Gastroenterology;  Laterality: N/A;   ESOPHAGOGASTRODUODENOSCOPY (  EGD) WITH PROPOFOL N/A 11/19/2021   Procedure: ESOPHAGOGASTRODUODENOSCOPY (EGD) WITH PROPOFOL;  Surgeon: Toney Reil, MD;  Location: [facility] ENDOSCOPY;  Service: Gastroenterology;  Laterality: N/A;   LAPAROSCOPIC CHOLECYSTECTOMY     LAPAROSCOPY  12/03/2010   VENTRAL HERNIA REPAIR      Family History  Family history unknown: Yes    Social History   Tobacco Use   Smoking status: Never   Smokeless tobacco: Never  Substance Use Topics   Alcohol use: No    Alcohol/week: 0.0 standard drinks of alcohol     Current Outpatient  Medications:    acetaminophen (TYLENOL) 500 MG tablet, Take 500 mg by mouth 3 (three) times daily., Disp: , Rfl:    antiseptic oral rinse (BIOTENE) LIQD, 15 mLs by Mouth Rinse route as needed for dry mouth., Disp: , Rfl:    aripiprazole (ABILIFY) 10 MG disintegrating tablet, Take 10 mg by mouth daily., Disp: , Rfl:    carbamazepine (EQUETRO) 200 MG CP12 12 hr capsule, Take 200 mg by mouth 2 (two) times daily., Disp: , Rfl:    carbamide peroxide (DEBROX) 6.5 % OTIC solution, PLACE 5 DROPS INTO BOTH EARS TWO TIMES A DAY, Disp: 15 mL, Rfl: 0   clonazePAM (KLONOPIN) 1 MG tablet, Take 1 mg by mouth daily., Disp: , Rfl:    Cyanocobalamin 1000 MCG/15ML LIQD, Take 5 mLs (333.3333 mcg total) by mouth daily., Disp: 450 mL, Rfl: 5   DENTA 5000 PLUS 1.1 % CREA dental cream, Take by mouth., Disp: , Rfl:    DULoxetine (CYMBALTA) 60 MG capsule, Take 60 mg by mouth daily., Disp: , Rfl:    Elastic Bandages & Supports (MEDICAL COMPRESSION SOCKS) MISC, 2 each by Does not apply route daily. Apply in am's and remove it at bedtime, Disp: 2 each, Rfl: 1   feeding supplement, ENSURE COMPLETE, (ENSURE COMPLETE) LIQD, Take 237 mLs by mouth 2 (two) times daily between meals., Disp: 237 mL, Rfl: 3   fexofenadine (ALLEGRA) 60 MG tablet, TAKE 1 TABLET BY MOUTH TWICE DAILY, Disp: 60 tablet, Rfl: 5   finasteride (PROSCAR) 5 MG tablet, Take 5 mg by mouth daily., Disp: , Rfl:    fluticasone (FLONASE) 50 MCG/ACT nasal spray, Place 2 sprays into both nostrils daily., Disp: 16 g, Rfl: 6   gabapentin (NEURONTIN) 300 MG capsule, Take 300 mg by mouth in the morning., Disp: , Rfl:    gabapentin (NEURONTIN) 600 MG tablet, Take 600 mg by mouth at bedtime., Disp: , Rfl:    ketoconazole (NIZORAL) 2 % cream, APPLY TOPICALLY 1 APPLICATION TO GROIN RASH ONCE DAILY, Disp: 60 g, Rfl: 11   Lubricants (K-Y JELLY) GEL, USE  AS NEEDED TO BE ABLE TO MASTURBATE, Disp: 57 g, Rfl: 10   nystatin cream (MYCOSTATIN), Apply topically 2 (two) times daily.,  Disp: 30 g, Rfl: 10   Polyethyl Glycol-Propyl Glycol (SYSTANE) 0.4-0.3 % SOLN, Apply to eye., Disp: , Rfl:    tamsulosin (FLOMAX) 0.4 MG CAPS capsule, TAKE 1 CAPSULE BY MOUTH ONCE DAILY  *DO NOT CRUSH OR CHEW* **DO NOT OPEN CAPSULE**, Disp: 7 capsule, Rfl: 10   Vitamin D, Ergocalciferol, (DRISDOL) 1.25 MG (50000 UNIT) CAPS capsule, Take 50,000 Units by mouth every 14 (fourteen) days., Disp: , Rfl:    Vitamins A & D (A+D PREVENT) OINT ointment, APPLY TOPICALLY AS NEEDED FOR DRY SKIN, Disp: 113 g, Rfl: 11   atorvastatin (LIPITOR) 10 MG tablet, Take 1 tablet (10 mg total) by mouth daily., Disp: 30 tablet, Rfl:  5   famotidine (PEPCID) 40 MG tablet, Take 1 tablet (40 mg total) by mouth daily., Disp: 30 tablet, Rfl: 5   lisinopril (ZESTRIL) 5 MG tablet, Take 1 tablet (5 mg total) by mouth daily., Disp: 30 tablet, Rfl: 5   meloxicam (MOBIC) 15 MG tablet, Take 1 tablet (15 mg total) by mouth daily as needed for pain. Please don't give it daily, Disp: 30 tablet, Rfl: 1   omega-3 acid ethyl esters (LOVAZA) 1 g capsule, TAKE 1 CAPSULE BY MOUTH TWICE DAILY  *DO NOT CRUSH OR CHEW*, Disp: 30 capsule, Rfl: 5   omeprazole (PRILOSEC) 20 MG capsule, TAKE 1 CAPSULE BY MOUTH ONCE DAILY *DO NOT CRUSH OR CHEW*, Disp: 30 capsule, Rfl: 5  Current Facility-Administered Medications:    nystatin (MYCOSTATIN/NYSTOP) topical powder, , Topical, PRN, Alba Cory, MD  No Known Allergies  I personally reviewed active problem list, medication list, allergies, family history, social history, health maintenance with the patient/caregiver today.   ROS  Ten systems reviewed and is negative except as mentioned in HPI    Objective  Vitals:   04/09/23 0837  BP: 118/70  Pulse: 78  Resp: 16  SpO2: 100%  Weight: 204 lb (92.5 kg)  Height: 6\' 3"  (1.905 m)    Body mass index is 25.5 kg/m.  Physical Exam  Constitutional: Patient appears well-developed and temporal waisting  No distress.  HEENT: head atraumatic,  normocephalic, pupils equal and reactive to light,, neck supple Cardiovascular: Normal rate, regular rhythm and normal heart sounds.  No murmur heard. No BLE edema. Pulmonary/Chest: Effort normal and breath sounds normal. No respiratory distress. Abdominal: Soft.  There is no tenderness. Psychiatric: Patient has a normal mood and affect. behavior is normal. Judgment and thought content normal.  Muscular skeletal: effusion right knee , antalgic gait    PHQ2/9:    04/09/2023    8:36 AM 08/11/2022    8:31 AM 07/10/2022   11:05 AM 04/08/2022    1:47 PM 01/16/2022   10:21 AM  Depression screen PHQ 2/9  Decreased Interest 0 0 0 0 1  Down, Depressed, Hopeless 0 0 0 0 0  PHQ - 2 Score 0 0 0 0 1  Altered sleeping 0 0 0 0   Tired, decreased energy 0 0 0 0   Change in appetite 0 0 0 0   Feeling bad or failure about yourself  0 0 0 0   Trouble concentrating 0 0 0 0   Moving slowly or fidgety/restless 0 0 0 0   Suicidal thoughts 0 0 0 0   PHQ-9 Score 0 0 0 0     phq 9 is negative   Fall Risk:    04/09/2023    8:36 AM 08/11/2022    8:31 AM 07/10/2022   11:08 AM 04/08/2022    1:47 PM 01/16/2022   10:21 AM  Fall Risk   Falls in the past year? 1 0 0 0 0  Number falls in past yr: 0 0   0  Injury with Fall? 1 0   0  Risk for fall due to : Impaired balance/gait No Fall Risks No Fall Risks No Fall Risks No Fall Risks  Follow up Falls prevention discussed Falls prevention discussed Education provided;Falls prevention discussed Falls prevention discussed;Education provided;Falls evaluation completed Falls prevention discussed      Functional Status Survey: Is the patient deaf or have difficulty hearing?: No Does the patient have difficulty seeing, even when wearing glasses/contacts?: No Does the  patient have difficulty concentrating, remembering, or making decisions?: Yes Does the patient have difficulty walking or climbing stairs?: No Does the patient have difficulty dressing or bathing?: No Does  the patient have difficulty doing errands alone such as visiting a doctor's office or shopping?: No    Assessment & Plan  1. Bipolar I disorder, single manic episode, moderate (HCC)  - aripiprazole (ABILIFY) 10 MG disintegrating tablet; Take 10 mg by mouth daily. Under the care of psychiatrist   2. Moderate protein-calorie malnutrition (HCC)  Continue protein supplementation   3. BPH with obstruction/lower urinary tract symptoms  Keep follow up with urologist   4. B12 deficiency  Continue supplementation   5. Intellectual disability  FL2 filled out   6. Primary osteoarthritis of both knees  - meloxicam (MOBIC) 15 MG tablet; Take 1 tablet (15 mg total) by mouth daily as needed for pain. Please don't give it daily  Dispense: 30 tablet; Refill: 1 - Ambulatory referral to Orthopedic Surgery  7. Dyslipidemia  - atorvastatin (LIPITOR) 10 MG tablet; Take 1 tablet (10 mg total) by mouth daily.  Dispense: 30 tablet; Refill: 5 - omega-3 acid ethyl esters (LOVAZA) 1 g capsule; TAKE 1 CAPSULE BY MOUTH TWICE DAILY  *DO NOT CRUSH OR CHEW*  Dispense: 30 capsule; Refill: 5 - Lipid panel  8. Benign essential HTN  - lisinopril (ZESTRIL) 5 MG tablet; Take 1 tablet (5 mg total) by mouth daily.  Dispense: 30 tablet; Refill: 5  9. GERD without esophagitis  - famotidine (PEPCID) 40 MG tablet; Take 1 tablet (40 mg total) by mouth daily.  Dispense: 30 tablet; Refill: 5 - omeprazole (PRILOSEC) 20 MG capsule; TAKE 1 CAPSULE BY MOUTH ONCE DAILY *DO NOT CRUSH OR CHEW*  Dispense: 30 capsule; Refill: 5  10. History of diabetes mellitus  - Hemoglobin A1c  11. Needs flu shot  - Flu Vaccine QUAD 6+ mos PF IM (Fluarix Quad PF)  12. Thyroid mass  - US THYROID; Future

## 2023-04-09 ENCOUNTER — Encounter: Payer: Self-pay | Admitting: Family Medicine

## 2023-04-09 ENCOUNTER — Ambulatory Visit (INDEPENDENT_AMBULATORY_CARE_PROVIDER_SITE_OTHER): Payer: Medicare Other | Admitting: Family Medicine

## 2023-04-09 VITALS — BP 118/70 | HR 78 | Resp 16 | Ht 75.0 in | Wt 204.0 lb

## 2023-04-09 DIAGNOSIS — N401 Enlarged prostate with lower urinary tract symptoms: Secondary | ICD-10-CM

## 2023-04-09 DIAGNOSIS — E538 Deficiency of other specified B group vitamins: Secondary | ICD-10-CM

## 2023-04-09 DIAGNOSIS — K219 Gastro-esophageal reflux disease without esophagitis: Secondary | ICD-10-CM

## 2023-04-09 DIAGNOSIS — I10 Essential (primary) hypertension: Secondary | ICD-10-CM

## 2023-04-09 DIAGNOSIS — Z8639 Personal history of other endocrine, nutritional and metabolic disease: Secondary | ICD-10-CM | POA: Diagnosis not present

## 2023-04-09 DIAGNOSIS — Z23 Encounter for immunization: Secondary | ICD-10-CM | POA: Diagnosis not present

## 2023-04-09 DIAGNOSIS — M170 Bilateral primary osteoarthritis of knee: Secondary | ICD-10-CM | POA: Diagnosis not present

## 2023-04-09 DIAGNOSIS — E785 Hyperlipidemia, unspecified: Secondary | ICD-10-CM

## 2023-04-09 DIAGNOSIS — F79 Unspecified intellectual disabilities: Secondary | ICD-10-CM | POA: Diagnosis not present

## 2023-04-09 DIAGNOSIS — N138 Other obstructive and reflux uropathy: Secondary | ICD-10-CM | POA: Diagnosis not present

## 2023-04-09 DIAGNOSIS — E079 Disorder of thyroid, unspecified: Secondary | ICD-10-CM

## 2023-04-09 DIAGNOSIS — F3012 Manic episode without psychotic symptoms, moderate: Secondary | ICD-10-CM

## 2023-04-09 DIAGNOSIS — E440 Moderate protein-calorie malnutrition: Secondary | ICD-10-CM | POA: Diagnosis not present

## 2023-04-09 MED ORDER — LISINOPRIL 5 MG PO TABS: 5.0000 mg | ORAL_TABLET | Freq: Every day | ORAL | 5 refills | Status: DC

## 2023-04-09 MED ORDER — OMEPRAZOLE 20 MG PO CPDR: DELAYED_RELEASE_CAPSULE | ORAL | 5 refills | Status: DC

## 2023-04-09 MED ORDER — MELOXICAM 15 MG PO TABS
15.0000 mg | ORAL_TABLET | Freq: Every day | ORAL | 1 refills | Status: AC | PRN
Start: 2023-04-09 — End: ?

## 2023-04-09 MED ORDER — OMEGA-3-ACID ETHYL ESTERS 1 G PO CAPS: ORAL_CAPSULE | ORAL | 5 refills | Status: DC

## 2023-04-09 MED ORDER — FAMOTIDINE 40 MG PO TABS
40.0000 mg | ORAL_TABLET | Freq: Every day | ORAL | 5 refills | Status: DC
Start: 2023-04-09 — End: 2023-10-07

## 2023-04-09 MED ORDER — ATORVASTATIN CALCIUM 10 MG PO TABS: 10.0000 mg | ORAL_TABLET | Freq: Every day | ORAL | 5 refills | Status: DC

## 2023-04-10 LAB — LIPID PANEL
Cholesterol: 151 mg/dL (ref <200)
HDL: 76 mg/dL (ref >=40)
LDL Cholesterol (Calc): 62 mg/dL
Non-HDL Cholesterol (Calc): 75 mg/dL (ref <130)
Total CHOL/HDL Ratio: 2 (calc) (ref <5.0)
Triglycerides: 57 mg/dL (ref <150)

## 2023-04-10 LAB — HEMOGLOBIN A1C
Hgb A1c MFr Bld: 4.9 %{Hb} (ref <5.7)
Mean Plasma Glucose: 94 mg/dL
eAG (mmol/L): 5.2 mmol/L

## 2023-04-13 ENCOUNTER — Telehealth: Payer: Self-pay | Admitting: Family Medicine

## 2023-04-13 NOTE — Telephone Encounter (Signed)
Completed and notified

## 2023-04-13 NOTE — Telephone Encounter (Signed)
Copied from CRM [id number]. Topic: General - Other >> Apr 13, 2023  8:09 AM Franchot Heidelberg wrote: Reason for CRM: Pt called requesting for an update on the paperwork that was dropped off last week.  [phone] Benjamine Mola with [employer]

## 2023-04-22 DIAGNOSIS — M172 Bilateral post-traumatic osteoarthritis of knee: Secondary | ICD-10-CM | POA: Diagnosis not present

## 2023-04-22 DIAGNOSIS — M25562 Pain in left knee: Secondary | ICD-10-CM | POA: Diagnosis not present

## 2023-04-22 DIAGNOSIS — M25561 Pain in right knee: Secondary | ICD-10-CM | POA: Diagnosis not present

## 2023-04-22 DIAGNOSIS — G8929 Other chronic pain: Secondary | ICD-10-CM | POA: Diagnosis not present

## 2023-05-11 ENCOUNTER — Ambulatory Visit
Admission: RE | Admit: 2023-05-11 | Discharge: 2023-05-11 | Disposition: A | Discharge status: Home / Self Care | Payer: Medicare Other | Source: Ambulatory Visit | Attending: Family Medicine | Admitting: Family Medicine

## 2023-05-11 DIAGNOSIS — E042 Nontoxic multinodular goiter: Secondary | ICD-10-CM | POA: Diagnosis not present

## 2023-05-11 DIAGNOSIS — E079 Disorder of thyroid, unspecified: Secondary | ICD-10-CM | POA: Diagnosis not present

## 2023-05-12 ENCOUNTER — Other Ambulatory Visit: Payer: Self-pay | Admitting: Internal Medicine

## 2023-05-12 DIAGNOSIS — J309 Allergic rhinitis, unspecified: Secondary | ICD-10-CM

## 2023-05-13 NOTE — Telephone Encounter (Signed)
Requested Prescriptions  Pending Prescriptions Disp Refills   fexofenadine (ALLEGRA) 60 MG tablet [Pharmacy Med Name: Fexofenadine HCl 60 MG Tablet] 14 tablet 10    Sig: TAKE 1 TABLET BY MOUTH TWICE DAILY     Ear, Nose, and Throat:  Antihistamines Passed - 05/12/2023  6:30 PM      Passed - Valid encounter within last 12 months    Recent Outpatient Visits           1 month ago Bipolar I disorder, single manic episode, moderate (HCC)   [place] [facility] Alba Cory, MD   9 months ago Thrombocytopenia [facility])   [place] [facility] Alba Cory, MD   1 year ago Rash in adult   [facility] Al[facility], MD   1 year ago Bipolar I disorder, single manic episode, moderate [facility])   [place] [facility] Alba Cory, MD   1 year ago Dysuria   [facility] Alba Cory, MD       Future Appointments             In 2 months  [facility]   In 4 months Alba Cory, MD [facility], [facility]

## 2023-05-14 ENCOUNTER — Other Ambulatory Visit: Payer: Self-pay

## 2023-05-14 DIAGNOSIS — E041 Nontoxic single thyroid nodule: Secondary | ICD-10-CM

## 2023-05-19 ENCOUNTER — Other Ambulatory Visit: Payer: Self-pay | Admitting: Internal Medicine

## 2023-05-19 DIAGNOSIS — J309 Allergic rhinitis, unspecified: Secondary | ICD-10-CM

## 2023-05-20 NOTE — Telephone Encounter (Signed)
Request is too soon, last refill 05/13/23 for 90 and 1 refill. Duplicate request.  Requested Prescriptions  Pending Prescriptions Disp Refills   fexofenadine (ALLEGRA) 60 MG tablet [Pharmacy Med Name: Fexofenadine HCl 60 MG Tablet] 14 tablet 10    Sig: TAKE 1 TABLET BY MOUTH TWICE DAILY     Ear, Nose, and Throat:  Antihistamines Passed - 05/19/2023 12:10 PM      Passed - Valid encounter within last 12 months    Recent Outpatient Visits           1 month ago Bipolar I disorder, single manic episode, moderate (HCC)   [place] [facility] Alba Cory, MD   9 months ago Thrombocytopenia [facility])   [place] [facility] Alba Cory, MD   1 year ago Rash in adult   [facility] Alba Cory, MD   1 year ago Bipolar I disorder, single manic episode, moderate [facility])   [place] [facility] Alba Cory, MD   1 year ago Dysuria   [facility] Alba Cory, MD       Future Appointments             In 1 month  [facility]   In 4 months Alba Cory, MD [facility]

## 2023-05-26 ENCOUNTER — Other Ambulatory Visit: Payer: Self-pay | Admitting: Internal Medicine

## 2023-05-26 DIAGNOSIS — J309 Allergic rhinitis, unspecified: Secondary | ICD-10-CM

## 2023-05-28 ENCOUNTER — Other Ambulatory Visit: Payer: Self-pay

## 2023-05-28 DIAGNOSIS — J309 Allergic rhinitis, unspecified: Secondary | ICD-10-CM

## 2023-05-28 NOTE — Telephone Encounter (Signed)
Requested medication (s) are due for refill today: yes  Requested medication (s) are on the active medication list: yes  Last refill:  05/13/23  Future visit scheduled: yes  Notes to clinic: Short supply given, should patient continue to take, routing for review     Requested Prescriptions  Pending Prescriptions Disp Refills   fexofenadine (ALLEGRA) 60 MG tablet [Pharmacy Med Name: Fexofenadine HCl 60 MG Tablet] 14 tablet 10    Sig: TAKE 1 TABLET BY MOUTH TWICE DAILY     Ear, Nose, and Throat:  Antihistamines Passed - 05/26/2023  6:40 PM      Passed - Valid encounter within last 12 months    Recent Outpatient Visits           1 month ago Bipolar I disorder, single manic episode, moderate (HCC)   [facility] Alba Cory, MD   9 months ago Thrombocytopenia [facility])   [place] [facility] Alba Cory, MD   1 year ago Rash in adult   [facility] Alba Cory, MD   1 year ago Bipolar I disorder, single manic episode, moderate [facility])   [place] [facility] Alba Cory, MD   1 year ago Dysuria   [facility] Alba Cory, MD       Future Appointments             In 1 month  [facility]   In 4 months Alba Cory, MD [facility], [facility]

## 2023-06-09 ENCOUNTER — Inpatient Hospital Stay (HOSPITAL_BASED_OUTPATIENT_CLINIC_OR_DEPARTMENT_OTHER): Payer: Medicare Other | Admitting: Oncology

## 2023-06-09 ENCOUNTER — Encounter: Payer: Self-pay | Admitting: Oncology

## 2023-06-09 ENCOUNTER — Inpatient Hospital Stay: Payer: Medicare Other | Attending: Oncology

## 2023-06-09 VITALS — BP 119/73 | HR 65 | Temp 95.7°F | Resp 18 | Wt 200.7 lb

## 2023-06-09 DIAGNOSIS — R634 Abnormal weight loss: Secondary | ICD-10-CM | POA: Insufficient documentation

## 2023-06-09 DIAGNOSIS — R161 Splenomegaly, not elsewhere classified: Secondary | ICD-10-CM | POA: Diagnosis not present

## 2023-06-09 DIAGNOSIS — D696 Thrombocytopenia, unspecified: Secondary | ICD-10-CM | POA: Insufficient documentation

## 2023-06-09 DIAGNOSIS — E079 Disorder of thyroid, unspecified: Secondary | ICD-10-CM | POA: Diagnosis not present

## 2023-06-09 DIAGNOSIS — F3489 Other specified persistent mood disorders: Secondary | ICD-10-CM | POA: Diagnosis not present

## 2023-06-09 LAB — CBC WITH DIFFERENTIAL (CANCER CENTER ONLY)
Abs Immature Granulocytes: 0.01 10*3/uL (ref 0.00–0.07)
Basophils Absolute: 0 10*3/uL (ref 0.0–0.1)
Basophils Relative: 1 %
Eosinophils Absolute: 0.1 10*3/uL (ref 0.0–0.5)
Eosinophils Relative: 1 %
HCT: 38.8 % — ABNORMAL LOW (ref 39.0–52.0)
Hemoglobin: 14 g/dL (ref 13.0–17.0)
Immature Granulocytes: 0 %
Lymphocytes Relative: 15 %
Lymphs Abs: 0.7 10*3/uL (ref 0.7–4.0)
MCH: 33.3 pg (ref 26.0–34.0)
MCHC: 36.1 g/dL — ABNORMAL HIGH (ref 30.0–36.0)
MCV: 92.2 fL (ref 80.0–100.0)
Monocytes Absolute: 0.4 10*3/uL (ref 0.1–1.0)
Monocytes Relative: 9 %
Neutro Abs: 3.2 10*3/uL (ref 1.7–7.7)
Neutrophils Relative %: 74 %
Platelet Count: 122 10*3/uL — ABNORMAL LOW (ref 150–400)
RBC: 4.21 MIL/uL — ABNORMAL LOW (ref 4.22–5.81)
RDW: 12.1 % (ref 11.5–15.5)
WBC Count: 4.3 10*3/uL (ref 4.0–10.5)
nRBC: 0 % (ref 0.0–0.2)

## 2023-06-09 LAB — CMP (CANCER CENTER ONLY)
ALT: 18 U/L (ref 0–44)
AST: 21 U/L (ref 15–41)
Albumin: 3.9 g/dL (ref 3.5–5.0)
Alkaline Phosphatase: 75 U/L (ref 38–126)
Anion gap: 9 (ref 5–15)
BUN: 13 mg/dL (ref 8–23)
CO2: 24 mmol/L (ref 22–32)
Calcium: 8.6 mg/dL — ABNORMAL LOW (ref 8.9–10.3)
Chloride: 99 mmol/L (ref 98–111)
Creatinine: 0.67 mg/dL (ref 0.61–1.24)
GFR, Estimated: >60 mL/min (ref >60)
Glucose, Bld: 122 mg/dL — ABNORMAL HIGH (ref 70–99)
Potassium: 4 mmol/L (ref 3.5–5.1)
Sodium: 132 mmol/L — ABNORMAL LOW (ref 135–145)
Total Bilirubin: 0.7 mg/dL (ref <1.2)
Total Protein: 6.4 g/dL — ABNORMAL LOW (ref 6.5–8.1)

## 2023-06-09 NOTE — Assessment & Plan Note (Addendum)
Patient continues to lose weight despite staff reporting that he is eating.  Patient had EGD and colonoscopy done, no findings to explain weight loss. CT chest showed  tree-in-bud nodularity peripheral right lower lobe, chronic changes.  - no cough per staff.  No finding to explain the patient's history of weight loss, refer to nutritionist

## 2023-06-09 NOTE — Assessment & Plan Note (Signed)
US thyroid was obtained. Pending biopsy

## 2023-06-09 NOTE — Progress Notes (Signed)
Hematology/Oncology Progress note [phone] [phone]     CHIEF COMPLAINTS/REASON FOR VISIT:  thrombocytopenia, weight loss   ASSESSMENT & PLAN:   Thrombocytopenia (HCC) Thrombocytopenia, platelet counts are Stable.   This could be secondary to carbamazepine/Abilify, or due to splenomegaly. Continue observation.  Weight loss, unintentional Patient continues to lose weight despite staff reporting that he is eating.  Patient had EGD and colonoscopy done, no findings to explain weight loss. CT chest showed  tree-in-bud nodularity peripheral right lower lobe, chronic changes.  - no cough per staff.  No finding to explain the patient's history of weight loss, refer to nutritionist   Splenomegaly Persistent mild splenomegaly on [place] JAK2 V617F mutation negative, with reflex to other mutations CALR, MPL, JAK 2 Ex 12-15 mutations negative. Low ANA titer 1:80, EBV/CMV negative.  Observation.    Thyroid mass US thyroid was obtained. Pending biopsy    Orders Placed This Encounter  Procedures   CBC with Differential (Cancer Center Only)    Standing Status:   Future    Standing Expiration Date:   06/08/2024   CMP (Cancer Center only)    Standing Status:   Future    Standing Expiration Date:   06/08/2024   Ambulatory Referral to [facility]    Referral Priority:   Routine    Referral Type:   Consultation    Referral Reason:   Specialty Services Required    Number of Visits Requested:   1   Follow-up 6 months.  All questions were answered. The patient knows to call the clinic with any problems, questions or concerns.  Rickard Patience, MD, PhD [facility] Hematology Oncology 06/09/2023    HISTORY OF PRESENTING ILLNESS:  Wayne Jones is a 62 y.o. male who was seen in consultation at the request of Wayne Cory, MD for evaluation of thrombocytopenia   Reviewed patient's labs done previously.  12/04/2020 labs showed decreased platelet counts at 99,000.   Normal wbc  hemoglobin  Reviewed patient's previous labs. Thrombocytopenia is chronic chronic onset , since at least 2014 with baseline around 100,000. Patient has a history of mental disorders and lives in a group home.  He is a poor historian.  Denies any new complaints.  Denies any easy bleeding or bruising.  He was accompanied by staff member from the group home.  Staff denies any recent change of Abilify and carbamazepine.  INTERVAL HISTORY Wayne Jones is a 62 y.o. male who has above history reviewed by me today presents for follow up visit for management of thrombocytopenia and weight loss Patient was accompanied by group home staff.   Weight loss 5 pounds since 6 months ago Patient is a poor historian. Per group home staff, patient is eating ok.    Review of Systems  Unable to perform ROS: Other (Mental disorder)  Constitutional:  Negative for appetite change, fatigue and unexpected weight change.  Respiratory:  Negative for cough and shortness of breath.   Cardiovascular:  Negative for chest pain.  Genitourinary:  Negative for dysuria.   Hematological:  Negative for adenopathy. Does not bruise/bleed easily.    MEDICAL HISTORY:  Past Medical History:  Diagnosis Date   Depression    Diabetes mellitus without complication (HCC)    Hyperlipidemia    Hypertension    Mental disorder     SURGICAL HISTORY: Past Surgical History:  Procedure Laterality Date   COLONOSCOPY WITH PROPOFOL N/A 11/19/2021   Procedure: COLONOSCOPY WITH PROPOFOL;  Surgeon: Toney Reil, MD;  Location: [facility] ENDOSCOPY;  Service: Gastroenterology;  Laterality: N/A;   ESOPHAGOGASTRODUODENOSCOPY (EGD) WITH PROPOFOL N/A 11/19/2021   Procedure: ESOPHAGOGASTRODUODENOSCOPY (EGD) WITH PROPOFOL;  Surgeon: Toney Reil, MD;  Location: [facility] ENDOSCOPY;  Service: Gastroenterology;  Laterality: N/A;   LAPAROSCOPIC CHOLECYSTECTOMY     LAPAROSCOPY  12/03/2010   VENTRAL HERNIA REPAIR      SOCIAL  HISTORY: Social History   Socioeconomic History   Marital status: Single    Spouse name: Not on file   Number of children: Not on file   Years of education: Not on file   Highest education level: Not on file  Occupational History   Occupation: disabled    Comment: Patient in care home  Tobacco Use   Smoking status: Never   Smokeless tobacco: Never  Vaping Use   Vaping status: Never Used  Substance and Sexual Activity   Alcohol use: No    Alcohol/week: 0.0 standard drinks of alcohol   Drug use: No   Sexual activity: Never  Other Topics Concern   Not on file  Social History Narrative   Vista Mink calls him twice weekly. Pt resides at [facility] group home at [facility].    Social Determinants of Health   Financial Resource Strain: Low Risk  (07/10/2022)   Overall Financial Resource Strain (CARDIA)    Difficulty of Paying Living Expenses: Not hard at all  Food Insecurity: No Food Insecurity (07/10/2022)   Hunger Vital Sign    Worried About Running Out of Food in the Last Year: Never true    Ran Out of Food in the Last Year: Never true  Transportation Needs: No Transportation Needs (07/10/2022)   PRAPARE - Administrator, Civil Service (Medical): No    Lack of Transportation (Non-Medical): No  Physical Activity: Sufficiently Active (07/10/2022)   Exercise Vital Sign    Days of Exercise per Week: 5 days    Minutes of Exercise per Session: 30 min  Stress: No Stress Concern Present (07/10/2022)   [employer] of Occupational Health - Occupational Stress Questionnaire    Feeling of Stress : Not at all  Social Connections: Socially Isolated (07/10/2022)   Social Connection and Isolation Panel [NHANES]    Frequency of Communication with Friends and Family: More than three times a week    Frequency of Social Gatherings with Friends and Family: Once a week    Attends Religious Services: Never    Database administrator or Organizations: No    Attends Tax inspector Meetings: Never    Marital Status: Never married  Intimate Partner Violence: Not At Risk (07/10/2022)   Humiliation, Afraid, Rape, and Kick questionnaire    Fear of Current or Ex-Partner: No    Emotionally Abused: No    Physically Abused: No    Sexually Abused: No    FAMILY HISTORY: Family History  Family history unknown: Yes    ALLERGIES:  has No Known Allergies.  MEDICATIONS:  Current Outpatient Medications  Medication Sig Dispense Refill   acetaminophen (TYLENOL) 500 MG tablet Take 500 mg by mouth 3 (three) times daily.     antiseptic oral rinse (BIOTENE) LIQD 15 mLs by Mouth Rinse route as needed for dry mouth.     aripiprazole (ABILIFY) 10 MG disintegrating tablet Take 10 mg by mouth daily.     atorvastatin (LIPITOR) 10 MG tablet Take 1 tablet (10 mg total) by mouth daily. 30 tablet 5   carbamazepine (EQUETRO) 200 MG CP12 12  hr capsule Take 200 mg by mouth 2 (two) times daily.     carbamide peroxide (DEBROX) 6.5 % OTIC solution PLACE 5 DROPS INTO BOTH EARS TWO TIMES A DAY 15 mL 0   clonazePAM (KLONOPIN) 1 MG tablet Take 1 mg by mouth daily.     Cyanocobalamin 1000 MCG/15ML LIQD Take 5 mLs (333.3333 mcg total) by mouth daily. 450 mL 5   DENTA 5000 PLUS 1.1 % CREA dental cream Take by mouth.     DULoxetine (CYMBALTA) 60 MG capsule Take 60 mg by mouth daily.     Elastic Bandages & Supports (MEDICAL COMPRESSION SOCKS) MISC 2 each by Does not apply route daily. Apply in am's and remove it at bedtime 2 each 1   famotidine (PEPCID) 40 MG tablet Take 1 tablet (40 mg total) by mouth daily. 30 tablet 5   feeding supplement, ENSURE COMPLETE, (ENSURE COMPLETE) LIQD Take 237 mLs by mouth 2 (two) times daily between meals. 237 mL 3   fexofenadine (ALLEGRA) 60 MG tablet TAKE 1 TABLET BY MOUTH TWICE DAILY 14 tablet 10   fluticasone (FLONASE) 50 MCG/ACT nasal spray Place 2 sprays into both nostrils daily. 16 g 6   gabapentin (NEURONTIN) 300 MG capsule Take 300 mg by mouth in the  morning.     gabapentin (NEURONTIN) 600 MG tablet Take 600 mg by mouth at bedtime.     ketoconazole (NIZORAL) 2 % cream APPLY TOPICALLY 1 APPLICATION TO GROIN RASH ONCE DAILY 60 g 11   lisinopril (ZESTRIL) 5 MG tablet Take 1 tablet (5 mg total) by mouth daily. 30 tablet 5   Lubricants (K-Y JELLY) GEL USE  AS NEEDED TO BE ABLE TO MASTURBATE 57 g 10   meloxicam (MOBIC) 15 MG tablet Take 1 tablet (15 mg total) by mouth daily as needed for pain. Please don't give it daily 30 tablet 1   nystatin cream (MYCOSTATIN) Apply topically 2 (two) times daily. 30 g 10   omega-3 acid ethyl esters (LOVAZA) 1 g capsule TAKE 1 CAPSULE BY MOUTH TWICE DAILY  *DO NOT CRUSH OR CHEW* 30 capsule 5   omeprazole (PRILOSEC) 20 MG capsule TAKE 1 CAPSULE BY MOUTH ONCE DAILY *DO NOT CRUSH OR CHEW* 30 capsule 5   Polyethyl Glycol-Propyl Glycol (SYSTANE) 0.4-0.3 % SOLN Apply to eye.     tamsulosin (FLOMAX) 0.4 MG CAPS capsule TAKE 1 CAPSULE BY MOUTH ONCE DAILY  *DO NOT CRUSH OR CHEW* **DO NOT OPEN CAPSULE** 7 capsule 10   Vitamin D, Ergocalciferol, (DRISDOL) 1.25 MG (50000 UNIT) CAPS capsule Take 50,000 Units by mouth every 14 (fourteen) days.     Vitamins A & D (A+D PREVENT) OINT ointment APPLY TOPICALLY AS NEEDED FOR DRY SKIN 113 g 11   finasteride (PROSCAR) 5 MG tablet Take 5 mg by mouth daily.     Current Facility-Administered Medications  Medication Dose Route Frequency Provider Last Rate Last Admin   nystatin (MYCOSTATIN/NYSTOP) topical powder   Topical PRN Wayne Cory, MD         PHYSICAL EXAMINATION: ECOG PERFORMANCE STATUS: 1 - Symptomatic but completely ambulatory Vitals:   06/09/23 0929 06/09/23 0945  BP: (!) 150/106 119/73  Pulse: 65   Resp: 18   Temp: (!) 95.7 F (35.4 C)   SpO2: 100%    Filed Weights   06/09/23 0929  Weight: 200 lb 11.2 oz (91 kg)    Physical Exam Constitutional:      General: He is not in acute distress. HENT:  Head: Normocephalic and atraumatic.  Eyes:     General:  No scleral icterus. Cardiovascular:     Rate and Rhythm: Normal rate and regular rhythm.  Pulmonary:     Effort: Pulmonary effort is normal. No respiratory distress.     Breath sounds: No wheezing.  Abdominal:     General: There is no distension.     Palpations: Abdomen is soft.  Musculoskeletal:        General: No deformity. Normal range of motion.     Cervical back: Normal range of motion and neck supple.  Skin:    General: Skin is warm and dry.     Findings: No erythema or rash.  Neurological:     Mental Status: He is alert. Mental status is at baseline.  Psychiatric:     Comments: Flat affect      LABORATORY DATA:  I have reviewed the data as listed Lab Results  Component Value Date   WBC 4.3 06/09/2023   HGB 14.0 06/09/2023   HCT 38.8 (L) 06/09/2023   MCV 92.2 06/09/2023   PLT 122 (L) 06/09/2023   Recent Labs    11/14/22 0837 01/08/23 0935 06/09/23 0916  NA 136 135 132*  K 4.0 3.9 4.0  CL 103 102 99  CO2 26 26 24   GLUCOSE 119* 101* 122*  BUN 14 10 13   CREATININE 0.64 0.64 0.67  CALCIUM 8.6* 8.6* 8.6*  GFRNONAA >60 >60 >60  PROT 6.7 5.8* 6.4*  ALBUMIN 4.1 3.6 3.9  AST 24 21 21   ALT 18 20 18   ALKPHOS 68 66 75  BILITOT 0.9 0.7 0.7   Iron/TIBC/Ferritin/ %Sat No results found for: "IRON", "TIBC", "FERRITIN", "IRONPCTSAT"    RADIOGRAPHIC STUDIES: I have personally reviewed the radiological images as listed and agreed with the findings in the report.  US THYROID  Result Date: 05/13/2023 CLINICAL DATA:  Incidental on CT.  thyroid mass on CT chest EXAM: THYROID ULTRASOUND TECHNIQUE: Ultrasound examination of the thyroid gland and adjacent soft tissues was performed. COMPARISON:  April 2024 FINDINGS: Parenchymal Echotexture: Normal Isthmus: 0.6 cm Right lobe: 6.5 x 3.1 x 2.9 cm Left lobe: 4.8 x 2.3 x 1.4 cm _________________________________________________________ Estimated total number of nodules >/= 1 cm: 1 Number of spongiform nodules >/=  2 cm not  described below (TR1): 0 Number of mixed cystic and solid nodules >/= 1.5 cm not described below (TR2): 0 _________________________________________________________ Nodule labeled 1 is a solid isoechoic appearing TR 3 nodule in the inferior right thyroid lobe measuring 4.3 x 3.0 x 2.3 cm. **Given size (>/= 2.5 cm) and appearance, fine needle aspiration of this mildly suspicious nodule should be considered based on TI-RADS criteria. This corresponds to comparison CT findings. Additional small subcentimeter cystic nodule in the inferior left thyroid lobe does not meet criteria for further dedicated follow-up or biopsy. IMPRESSION: Nodule labeled 1 in the right thyroid lobe (4.3 cm TR 3) meets criteria for biopsy. This nodule corresponds to the findings on CT. The above is in keeping with the ACR TI-RADS recommendations - J Am Coll Radiol 2017;14:587-595. Electronically Signed   By: Olive Bass M.D.   On: 05/13/2023 08:24

## 2023-06-09 NOTE — Assessment & Plan Note (Signed)
Thrombocytopenia, platelet counts are  Stable.  This could be secondary to carbamazepine/Abilify, or due to splenomegaly. Continue observation.

## 2023-06-09 NOTE — Assessment & Plan Note (Signed)
Persistent mild splenomegaly on [place] JAK2 V617F mutation negative, with reflex to other mutations CALR, MPL, JAK 2 Ex 12-15 mutations negative. Low ANA titer 1:80, EBV/CMV negative.  Observation.

## 2023-06-15 DIAGNOSIS — E041 Nontoxic single thyroid nodule: Secondary | ICD-10-CM | POA: Diagnosis not present

## 2023-06-23 ENCOUNTER — Inpatient Hospital Stay: Payer: Medicare Other

## 2023-06-23 NOTE — Progress Notes (Signed)
Nutrition Assessment   Reason for Assessment:  Weight loss   ASSESSMENT:  62 year old male with thrombocytopenia, weight loss, thyroid nodule.  Past medical history of intellectual disability, CKD, DM, GERD, HLD, HTN, Vit B 12 deficiency, bipolar.  EGD and colonoscopy no findings to explain weight loss.   Met with patient and caregiver Isaiah Blakes.  Patient lives at [facility].  Has a legal guardian Francee Piccolo but did not come to visit.  Isaiah Blakes reports good appetite.  He does not currently work with Mr Freedland but staff did not report any problems with his eating.  Facility prepares breakfast, lunch and dinner for patient.  Offers 3 snacks as well.  Patient goes to [employer] from 8-2:30pm and has lunch and 2 snacks at during that time frame.  Breakfast patients have an option of cereal, oatmeal, eggs, breakfast meat, fruit.  Lunch and dinner is meat and  Vegetables.  Drinks ensure complete 350 BID.  Mainly drinks water and sugar free beverages.  Patient has not reported any nausea, vomiting, diarrhea, constipation, trouble chewing to staff.     Medications: pepcid, Vit B 12, omega 3, Vit D   Labs: A1c 4.9   Anthropometrics:   Height: 75 inches Weight: 200 lb today 210 lb 08/11/22 225 lb 09/04/21 242 lb 09/18/20 BMI: 25  5% weight loss in the last 10 months, not significant 12.5% weight loss in the last year and 9 months   Estimated Energy Needs  Kcals: 2275-2730 Protein: 113-136 g Fluid: 2275-2370 ml   NUTRITION DIAGNOSIS: Unintentional weight loss related to ? Unknown cause as evidenced by 5% weight loss in the last 10 months   INTERVENTION:  Recommend increasing ensure complete from BID to TID for additional calories and protein. Encourage staff to make sure patient has at least 2-3 foods on his plate (ie meat, vegetable, fruit or cereal, fruit and yogurt) at meal time and at least 2 food choices at snack time (ie fruit and yogurt, trail mix and shake).   Encourage  staff to offer additional food to patient (seconds if he will eat it).   Encouraged staff to offer high calorie options (yogurt higher in calorie than jello).   Sample meal plan for 2200 calories given to [facility].   Contact information provided   MONITORING, EVALUATION, GOAL: weight trends, intake   Next Visit: Tuesday, Feb 18 follow-up  Ociel Retherford B. Freida Busman, RD, LDN Registered Dietitian [phone]

## 2023-06-24 DIAGNOSIS — S0993XA Unspecified injury of face, initial encounter: Secondary | ICD-10-CM | POA: Diagnosis not present

## 2023-06-24 DIAGNOSIS — S025XXA Fracture of tooth (traumatic), initial encounter for closed fracture: Secondary | ICD-10-CM | POA: Diagnosis not present

## 2023-06-24 DIAGNOSIS — S59902A Unspecified injury of left elbow, initial encounter: Secondary | ICD-10-CM | POA: Diagnosis not present

## 2023-06-24 DIAGNOSIS — S8992XA Unspecified injury of left lower leg, initial encounter: Secondary | ICD-10-CM | POA: Diagnosis not present

## 2023-06-24 DIAGNOSIS — S80212A Abrasion, left knee, initial encounter: Secondary | ICD-10-CM | POA: Diagnosis not present

## 2023-06-24 DIAGNOSIS — M1712 Unilateral primary osteoarthritis, left knee: Secondary | ICD-10-CM | POA: Diagnosis not present

## 2023-06-24 DIAGNOSIS — W010XXA Fall on same level from slipping, tripping and stumbling without subsequent striking against object, initial encounter: Secondary | ICD-10-CM | POA: Diagnosis not present

## 2023-06-29 ENCOUNTER — Other Ambulatory Visit: Payer: Self-pay | Admitting: Family Medicine

## 2023-06-29 ENCOUNTER — Encounter: Payer: Self-pay | Admitting: Podiatry

## 2023-06-29 ENCOUNTER — Ambulatory Visit (INDEPENDENT_AMBULATORY_CARE_PROVIDER_SITE_OTHER): Payer: Medicare Other | Admitting: Podiatry

## 2023-06-29 VITALS — Ht 75.0 in | Wt 200.0 lb

## 2023-06-29 DIAGNOSIS — B351 Tinea unguium: Secondary | ICD-10-CM

## 2023-06-29 DIAGNOSIS — Z8639 Personal history of other endocrine, nutritional and metabolic disease: Secondary | ICD-10-CM

## 2023-06-29 DIAGNOSIS — E785 Hyperlipidemia, unspecified: Secondary | ICD-10-CM

## 2023-06-29 DIAGNOSIS — L84 Corns and callosities: Secondary | ICD-10-CM | POA: Diagnosis not present

## 2023-06-29 DIAGNOSIS — M79675 Pain in left toe(s): Secondary | ICD-10-CM | POA: Diagnosis not present

## 2023-06-29 DIAGNOSIS — M79674 Pain in right toe(s): Secondary | ICD-10-CM

## 2023-06-29 NOTE — Progress Notes (Signed)
  Subjective:  Patient ID: Wayne Jones, male    DOB: July 31, 1961,  MRN: [id number]  Saul Fordyce presents to clinic today for preventative diabetic foot care and callus(es) of both feet and painful thick toenails that are difficult to trim. Painful toenails interfere with ambulation. Aggravating factors include wearing enclosed shoe gear. Pain is relieved with periodic professional debridement. Painful calluses are aggravated when weightbearing with and without shoegear. Pain is relieved with periodic professional debridement. He is accompanied by his caregiver on today's visit. Chief Complaint  Patient presents with   Diabetes    Patient is here for routine DFC, Last A1C: 4.9 (04/09/2023)-patient is not on blood thinners   New problem(s): None.   PCP is Alba Cory, MD.  No Known Allergies  Review of Systems: Negative except as noted in the HPI.  Objective: No changes noted in today's physical examination. There were no vitals filed for this visit. Wayne Jones is a pleasant 62 y.o. male WD, WN in NAD. AAO x 3.  Vascular Examination: Capillary refill time <3 seconds b/l LE. Palpable pedal pulses b/l LE. Digital hair present b/l. No pedal edema b/l. Skin temperature gradient WNL b/l. No varicosities b/l. Marland Kitchen  Dermatological Examination: Pedal skin with normal turgor, texture and tone b/l. No open wounds. No interdigital macerations b/l. Toenails 2-5 b/l thickened, discolored, dystrophic with subungual debris. There is pain on palpation to dorsal aspect of nailplates. Anonychia noted bilateral great toes. Nailbed(s) epithelialized.    Hyperkeratotic lesion(s) submet head 1 b/l.  No erythema, no edema, no drainage, no fluctuance..  Neurological Examination: Protective sensation intact with 10 gram monofilament b/l LE. Vibratory sensation intact b/l LE.   Musculoskeletal Examination: Muscle strength 5/5 to all LE muscle groups b/l. No pain, crepitus or joint limitation noted  with ROM bilateral LE. Hammertoe deformity noted 2-5 b/l. Patient ambulates independent of any assistive aids.  Assessment/Plan: 1. Pain due to onychomycosis of toenails of both feet   2. Callus   3. History of diabetes mellitus    -Caregiver/provider present with patient on today's visit. -Continue foot and shoe inspections daily. Monitor blood glucose per PCP/Endocrinologist's recommendations. -Continue supportive shoe gear daily. -Mycotic toenails 1-5 bilaterally were debrided in length and girth with sterile nail nippers and dremel without incident. -Callus(es) submet head 1 b/l pared utilizing sterile scalpel blade without complication or incident. Total number debrided =2. -Patient/POA to call should there be question/concern in the interim.   Return in about 6 months (around 12/27/2023).  Freddie Breech, DPM      [place] LOCATION: [address], [place] [address]                   Office [phone]   [facility] LOCATION: [address] [place], [place] [address] Office [phone]

## 2023-07-14 DIAGNOSIS — E041 Nontoxic single thyroid nodule: Secondary | ICD-10-CM | POA: Diagnosis not present

## 2023-07-16 ENCOUNTER — Ambulatory Visit: Payer: Medicare Other

## 2023-07-16 VITALS — BP 122/64 | Ht 75.0 in | Wt 201.5 lb

## 2023-07-16 DIAGNOSIS — Z0000 Encounter for general adult medical examination without abnormal findings: Secondary | ICD-10-CM | POA: Diagnosis not present

## 2023-07-16 NOTE — Progress Notes (Signed)
Subjective:   Wayne Jones is a 62 y.o. male who presents for Medicare Annual/Subsequent preventive examination.  Visit Complete: In person  Cardiac Risk Factors include: advanced age (>25men, >64 women);dyslipidemia;hypertension;male gender     Objective:    Today's Vitals   07/16/23 1110  BP: 122/64  Weight: 201 lb 8 oz (91.4 kg)  Height: 6\' 3"  (1.905 m)   Body mass index is 25.19 kg/m.     07/16/2023   11:23 AM 12/05/2022   10:28 AM 11/19/2021   10:34 AM 10/10/2021   10:32 AM 07/09/2021    2:32 PM 12/17/2020   11:20 AM 12/17/2020   11:15 AM  Advanced Directives  Does Patient Have a Medical Advance Directive? No Yes No No -- No Unable to assess, patient is non-responsive or altered mental status  Would patient like information on creating a medical advance directive? No - Patient declined          Current Medications (verified) Outpatient Encounter Medications as of 07/16/2023  Medication Sig   acetaminophen (TYLENOL) 500 MG tablet Take 500 mg by mouth 3 (three) times daily.   antiseptic oral rinse (BIOTENE) LIQD 15 mLs by Mouth Rinse route as needed for dry mouth.   aripiprazole (ABILIFY) 10 MG disintegrating tablet Take 10 mg by mouth daily.   atorvastatin (LIPITOR) 10 MG tablet Take 1 tablet (10 mg total) by mouth daily.   carbamazepine (EQUETRO) 200 MG CP12 12 hr capsule Take 200 mg by mouth 2 (two) times daily.   carbamide peroxide (DEBROX) 6.5 % OTIC solution PLACE 5 DROPS INTO BOTH EARS TWO TIMES A DAY   clonazePAM (KLONOPIN) 1 MG tablet Take 1 mg by mouth daily.   DENTA 5000 PLUS 1.1 % CREA dental cream Take by mouth.   DULoxetine (CYMBALTA) 60 MG capsule Take 60 mg by mouth daily.   Elastic Bandages & Supports (MEDICAL COMPRESSION SOCKS) MISC 2 each by Does not apply route daily. Apply in am's and remove it at bedtime   famotidine (PEPCID) 40 MG tablet Take 1 tablet (40 mg total) by mouth daily.   feeding supplement, ENSURE COMPLETE, (ENSURE COMPLETE) LIQD  Take 237 mLs by mouth 2 (two) times daily between meals.   fexofenadine (ALLEGRA) 60 MG tablet TAKE 1 TABLET BY MOUTH TWICE DAILY   fluticasone (FLONASE) 50 MCG/ACT nasal spray Place 2 sprays into both nostrils daily.   gabapentin (NEURONTIN) 300 MG capsule Take 300 mg by mouth in the morning.   gabapentin (NEURONTIN) 600 MG tablet Take 600 mg by mouth at bedtime.   ketoconazole (NIZORAL) 2 % cream APPLY TOPICALLY 1 APPLICATION TO GROIN RASH ONCE DAILY   lisinopril (ZESTRIL) 5 MG tablet Take 1 tablet (5 mg total) by mouth daily.   Lubricants (K-Y JELLY) GEL USE  AS NEEDED TO BE ABLE TO MASTURBATE   meloxicam (MOBIC) 15 MG tablet Take 1 tablet (15 mg total) by mouth daily as needed for pain. Please don't give it daily   nystatin cream (MYCOSTATIN) Apply topically 2 (two) times daily.   omega-3 acid ethyl esters (LOVAZA) 1 g capsule TAKE 1 CAPSULE BY MOUTH TWICE DAILY  *DO NOT CRUSH OR CHEW*   omeprazole (PRILOSEC) 20 MG capsule TAKE 1 CAPSULE BY MOUTH ONCE DAILY *DO NOT CRUSH OR CHEW*   Polyethyl Glycol-Propyl Glycol (SYSTANE) 0.4-0.3 % SOLN Apply to eye.   tamsulosin (FLOMAX) 0.4 MG CAPS capsule TAKE 1 CAPSULE BY MOUTH ONCE DAILY  *DO NOT CRUSH OR CHEW* **DO NOT OPEN  CAPSULE**   Vitamin D, Ergocalciferol, (DRISDOL) 1.25 MG (50000 UNIT) CAPS capsule Take 50,000 Units by mouth every 14 (fourteen) days.   Vitamins A & D (A+D PREVENT) OINT ointment APPLY TOPICALLY AS NEEDED FOR DRY SKIN   Cyanocobalamin 1000 MCG/15ML LIQD Take 5 mLs (333.3333 mcg total) by mouth daily. (Patient not taking: Reported on 07/16/2023)   finasteride (PROSCAR) 5 MG tablet Take 5 mg by mouth daily. (Patient not taking: Reported on 07/16/2023)   Facility-Administered Encounter Medications as of 07/16/2023  Medication   nystatin (MYCOSTATIN/NYSTOP) topical powder    Allergies (verified) Patient has no known allergies.   History: Past Medical History:  Diagnosis Date   Depression    Diabetes mellitus without  complication (HCC)    Hyperlipidemia    Hypertension    Mental disorder    Past Surgical History:  Procedure Laterality Date   COLONOSCOPY WITH PROPOFOL N/A 11/19/2021   Procedure: COLONOSCOPY WITH PROPOFOL;  Surgeon: Toney Reil, MD;  Location: [facility] ENDOSCOPY;  Service: Gastroenterology;  Laterality: N/A;   ESOPHAGOGASTRODUODENOSCOPY (EGD) WITH PROPOFOL N/A 11/19/2021   Procedure: ESOPHAGOGASTRODUODENOSCOPY (EGD) WITH PROPOFOL;  Surgeon: Toney Reil, MD;  Location: [facility] ENDOSCOPY;  Service: Gastroenterology;  Laterality: N/A;   LAPAROSCOPIC CHOLECYSTECTOMY     LAPAROSCOPY  12/03/2010   VENTRAL HERNIA REPAIR     Family History  Family history unknown: Yes   Social History   Socioeconomic History   Marital status: Single    Spouse name: Not on file   Number of children: Not on file   Years of education: Not on file   Highest education level: Not on file  Occupational History   Occupation: disabled    Comment: Patient in care home  Tobacco Use   Smoking status: Never   Smokeless tobacco: Never  Vaping Use   Vaping status: Never Used  Substance and Sexual Activity   Alcohol use: No    Alcohol/week: 0.0 standard drinks of alcohol   Drug use: No   Sexual activity: Never  Other Topics Concern   Not on file  Social History Narrative   Vista Mink calls him twice weekly. Pt resides at [facility] group home at [facility].    Social Drivers of Health   Financial Resource Strain: Low Risk  (07/16/2023)   Overall Financial Resource Strain (CARDIA)    Difficulty of Paying Living Expenses: Not hard at all  Food Insecurity: No Food Insecurity (07/16/2023)   Hunger Vital Sign    Worried About Running Out of Food in the Last Year: Never true    Ran Out of Food in the Last Year: Never true  Transportation Needs: No Transportation Needs (07/16/2023)   PRAPARE - Administrator, Civil Service (Medical): No    Lack of Transportation (Non-Medical): No   Physical Activity: Sufficiently Active (07/16/2023)   Exercise Vital Sign    Days of Exercise per Week: 5 days    Minutes of Exercise per Session: 30 min  Stress: No Stress Concern Present (07/16/2023)   [employer] of Occupational Health - Occupational Stress Questionnaire    Feeling of Stress : Not at all  Social Connections: Socially Isolated (07/16/2023)   Social Connection and Isolation Panel [NHANES]    Frequency of Communication with Friends and Family: Twice a week    Frequency of Social Gatherings with Friends and Family: Twice a week    Attends Religious Services: Never    Database administrator or Organizations: No  Attends Banker Meetings: Never    Marital Status: Never married    Tobacco Counseling Counseling given: Not Answered   Clinical Intake:  Pre-visit preparation completed: Yes  Pain : No/denies pain     BMI - recorded: 25.19 Nutritional Status: BMI 25 -29 Overweight Nutritional Risks: None Diabetes: No  How often do you need to have someone help you when you read instructions, pamphlets, or other written materials from your doctor or pharmacy?: 1 - Never  Interpreter Needed?: No  Information entered by :: Kennedy Bucker, LPN   Activities of Daily Living    07/16/2023   11:25 AM 04/09/2023    8:36 AM  In your present state of health, do you have any difficulty performing the following activities:  Hearing? 0 0  Vision? 0 0  Difficulty concentrating or making decisions? 1 1  Walking or climbing stairs? 1 0  Comment KNEE PAIN   Dressing or bathing? 1 0  Doing errands, shopping? 1 0  Preparing Food and eating ? Y   Using the Toilet? Y   In the past six months, have you accidently leaked urine? N   Do you have problems with loss of bowel control? N   Managing your Medications? Y   Managing your Finances? Y   Housekeeping or managing your Housekeeping? Y     Patient Care Team: Alba Cory, MD as PCP - General  (Family Medicine) Antonietta Breach, MD as Referring Physician (Psychiatry) Helane Gunther, DPM as Consulting Physician (Podiatry) Vickii Penna, MD as Referring Physician (Urology) Rickard Patience, MD as Consulting Physician (Hematology) Debbe Odea, MD as Consulting Physician (Cardiology) Galen Manila, MD as Referring Physician (Ophthalmology)  Indicate any recent Medical Services you may have received from other than [facility] providers in the past year (date may be approximate).     Assessment:   This is a routine wellness examination for Jhayce.  Hearing/Vision screen Hearing Screening - Comments:: NO AIDS Vision Screening - Comments:: NO GLASSES- [facility]- DR.PORFILIO   Goals Addressed             This Visit's Progress    DIET - INCREASE WATER INTAKE         Depression Screen    07/16/2023   11:21 AM 04/09/2023    8:36 AM 08/11/2022    8:31 AM 07/10/2022   11:05 AM 04/08/2022    1:47 PM 01/16/2022   10:21 AM 10/03/2021    9:53 AM  PHQ 2/9 Scores  PHQ - 2 Score 0 0 0 0 0 1 0  PHQ- 9 Score 0 0 0 0 0  0    Fall Risk    07/16/2023   11:24 AM 04/09/2023    8:36 AM 08/11/2022    8:31 AM 07/10/2022   11:08 AM 04/08/2022    1:47 PM  Fall Risk   Falls in the past year? 1 1 0 0 0  Number falls in past yr: 1 0 0    Injury with Fall? 1 1 0    Risk for fall due to : History of fall(s);Other (Comment) Impaired balance/gait No Fall Risks No Fall Risks No Fall Risks  Risk for fall due to: Comment TERRIBLE KNEE PAIN      Follow up Falls evaluation completed;Falls prevention discussed Falls prevention discussed Falls prevention discussed Education provided;Falls prevention discussed Falls prevention discussed;Education provided;Falls evaluation completed    MEDICARE RISK AT HOME: Medicare Risk at Home Any stairs in or around the  home?: Yes If so, are there any without handrails?: No Home free of loose throw rugs in walkways, pet beds, electrical cords, etc?:  Yes Adequate lighting in your home to reduce risk of falls?: Yes Life alert?: No Use of a cane, walker or w/c?: No Grab bars in the bathroom?: Yes Shower chair or bench in shower?: Yes Elevated toilet seat or a handicapped toilet?: Yes  TIMED UP AND GO:  Was the test performed?  Yes  Length of time to ambulate 10 feet: 4 sec Gait steady and fast without use of assistive device    Cognitive Function:        07/16/2023   11:27 AM 07/10/2022   11:07 AM  6CIT Screen  What Year? 4 points 4 points  What month? 0 points 3 points  What time? 3 points 3 points  Count back from 20 4 points 4 points  Months in reverse 4 points 4 points  Repeat phrase 10 points 0 points  Total Score 25 points 18 points    Immunizations Immunization History  Administered Date(s) Administered   Influenza, Seasonal, Injecte, Preservative Fre 05/30/2010, 05/28/2011   Influenza,inj,Quad PF,6+ Mos 04/13/2013, 04/24/2014, 03/14/2015, 06/13/2016, 05/20/2017, 03/26/2018, 06/04/2018, 04/20/2019, 04/03/2020, 04/04/2021, 04/08/2022, 04/09/2023   Influenza,inj,quad, With Preservative 05/05/2019   Moderna SARS-COV2 Booster Vaccination 06/11/2021, 02/20/2022   Moderna Sars-Covid-2 Vaccination 09/07/2019, 10/06/2019, 06/11/2020, 01/16/2021   Pneumococcal Conjugate-13 05/25/2017   Pneumococcal Polysaccharide-23 03/31/2012, 03/17/2016   Td (Adult),5 Lf Tetanus Toxid, Preservative Free 06/24/2023   Tdap 03/17/2016, 10/05/2016, 08/13/2017   Zoster Recombinant(Shingrix) 12/21/2021    TDAP status: Up to date  Flu Vaccine status: Up to date  Pneumococcal vaccine status: Up to date  Covid-19 vaccine status: Completed vaccines  Qualifies for Shingles Vaccine? Yes   Zostavax completed No   Shingrix Completed?: No.    Education has been provided regarding the importance of this vaccine. Patient has been advised to call insurance company to determine out of pocket expense if they have not yet received this vaccine.  Advised may also receive vaccine at local pharmacy or Health Dept. Verbalized acceptance and understanding.  Screening Tests Health Maintenance  Topic Date Due   HIV Screening  Never done   Diabetic kidney evaluation - Urine ACR  09/28/2019   Zoster Vaccines- Shingrix (2 of 2) 02/15/2022   OPHTHALMOLOGY EXAM  07/08/2022   COVID-19 Vaccine (5 - 2024-25 season) 04/05/2023   Diabetic kidney evaluation - eGFR measurement  06/08/2024   Medicare Annual Wellness (AWV)  07/15/2024   Fecal DNA (Cologuard)  07/15/2024   Pneumococcal Vaccine 65-68 Years old (3 of 3 - PPSV23 or PCV20) 10/16/2025   DTaP/Tdap/Td (5 - Td or Tdap) 06/23/2033   INFLUENZA VACCINE  Completed   Hepatitis C Screening  Completed   HPV VACCINES  Aged Out  NEEDS 2ND [facility] SHOT  Health Maintenance  Health Maintenance Due  Topic Date Due   HIV Screening  Never done   Diabetic kidney evaluation - Urine ACR  09/28/2019   Zoster Vaccines- Shingrix (2 of 2) 02/15/2022   OPHTHALMOLOGY EXAM  07/08/2022   COVID-19 Vaccine (5 - 2024-25 season) 04/05/2023    Colorectal cancer screening: Type of screening: Cologuard. Completed 07/15/21. Repeat every 3 years- HAD COLONOSCOPY 11/19/21  Lung Cancer Screening: (Low Dose CT Chest recommended if Age 48-80 years, 20 pack-year currently smoking OR have quit w/in 15years.) does not qualify.    Additional Screening:  Hepatitis C Screening: does qualify; Completed 12/04/20  Vision Screening: Recommended annual  ophthalmology exams for early detection of glaucoma and other disorders of the eye. Is the patient up to date with their annual eye exam?  Yes  Who is the provider or what is the name of the office in which the patient attends annual eye exams? DR.PORFILIO If pt is not established with a provider, would they like to be referred to a provider to establish care? No .   Dental Screening: Recommended annual dental exams for proper oral hygiene   Community Resource Referral /  Chronic Care Management: CRR required this visit?  No   CCM required this visit?  No     Plan:     I have personally reviewed and noted the following in the patient's chart:   Medical and social history Use of alcohol, tobacco or illicit drugs  Current medications and supplements including opioid prescriptions. Patient is not currently taking opioid prescriptions. Functional ability and status Nutritional status Physical activity Advanced directives List of other physicians Hospitalizations, surgeries, and ER visits in previous 12 months Vitals Screenings to include cognitive, depression, and falls Referrals and appointments  In addition, I have reviewed and discussed with patient certain preventive protocols, quality metrics, and best practice recommendations. A written personalized care plan for preventive services as well as general preventive health recommendations were provided to patient.     Hal Hope, LPN   16/05/[id number]   After Visit Summary: (In Person-Declined) Patient declined AVS at this time.  Nurse Notes: NONE

## 2023-07-16 NOTE — Patient Instructions (Addendum)
Wayne Jones , Thank you for taking time to come for your Medicare Wellness Visit. I appreciate your ongoing commitment to your health goals. Please review the following plan we discussed and let me know if I can assist you in the future.   Referrals/Orders/Follow-Ups/Clinician Recommendations: NONE  This is a list of the screening recommended for you and due dates:  Health Maintenance  Topic Date Due   HIV Screening  Never done   Yearly kidney health urinalysis for diabetes  09/28/2019   Zoster (Shingles) Vaccine (2 of 2) 02/15/2022   Eye exam for diabetics  07/08/2022   COVID-19 Vaccine (5 - 2024-25 season) 04/05/2023   Yearly kidney function blood test for diabetes  06/08/2024   Medicare Annual Wellness Visit  07/15/2024   Cologuard (Stool DNA test)  07/15/2024   Pneumococcal Vaccination (3 of 3 - PPSV23 or PCV20) 10/16/2025   DTaP/Tdap/Td vaccine (5 - Td or Tdap) 06/23/2033   Flu Shot  Completed   Hepatitis C Screening  Completed   HPV Vaccine  Aged Out    Advanced directives: (ACP Link)Information on Advanced Care Planning can be found at [facility] [place] Advance Health Care Directives Advance Health Care Directives ([email or web address]   Next Medicare Annual Wellness Visit scheduled for next year: Yes  07/21/24 @ 10:50 AM BY PHONE PER REQUEST

## 2023-08-04 DIAGNOSIS — H353134 Nonexudative age-related macular degeneration, bilateral, advanced atrophic with subfoveal involvement: Secondary | ICD-10-CM | POA: Diagnosis not present

## 2023-08-04 DIAGNOSIS — H04123 Dry eye syndrome of bilateral lacrimal glands: Secondary | ICD-10-CM | POA: Diagnosis not present

## 2023-08-04 DIAGNOSIS — H35379 Puckering of macula, unspecified eye: Secondary | ICD-10-CM | POA: Diagnosis not present

## 2023-08-04 LAB — HM DIABETES EYE EXAM

## 2023-09-08 DIAGNOSIS — F3489 Other specified persistent mood disorders: Secondary | ICD-10-CM | POA: Diagnosis not present

## 2023-09-22 ENCOUNTER — Other Ambulatory Visit: Payer: Self-pay | Admitting: Family Medicine

## 2023-09-22 ENCOUNTER — Inpatient Hospital Stay: Payer: Medicare Other

## 2023-09-22 DIAGNOSIS — N138 Other obstructive and reflux uropathy: Secondary | ICD-10-CM

## 2023-09-22 DIAGNOSIS — R350 Frequency of micturition: Secondary | ICD-10-CM

## 2023-09-22 NOTE — Progress Notes (Signed)
Nutrition  Wayne Jones called RD this am and said that patient would not be able to come to scheduled nutrition visit this am due to having a meeting at his group home that he has to attend this am.  Rescheduled to Friday, Feb 28th.    Cristel Rail B. Freida Busman, RD, LDN Registered Dietitian [phone]

## 2023-10-02 ENCOUNTER — Inpatient Hospital Stay: Payer: Medicare Other | Attending: Oncology

## 2023-10-02 NOTE — Progress Notes (Signed)
 Nutrition Follow-up:  Patient with thrombocytopenia, weight loss and thyroid nodule.  Followed by Dr Cathie Hoops.  EGD and colonoscopy no findings to explain weight loss  Met with patient and caregiver Isaiah Blakes in clinic for nutrition follow-up.  Patient lives at [facility].  Isaiah Blakes does not work with patient every day but drives him to appointments and sees him periodically.  Isaiah Blakes reports that staff at group home continue to report that patient has a good appetite.  Eats 3 meals a day.  Lunch is provided by [employer] daily along with 2 snacks while he is there.  Group home also provides patient with snacks during the day between meals (2-3).  Patient is drinking oral nutrition supplement shakes (per MAR from group home ensure complete BID).  Denies any trouble eating.  Isaiah Blakes does report that sometimes patient will get sick when he does not want to go anywhere or do anything.  Results of thyroid biopsy were negative per Isaiah Blakes.  Denies any nutrition impact symptoms    Medications: reviewed  Labs: no recent  Anthropometrics:   Weight 195 lb 9 oz today  2/15 199 lb at group home 200 lb on 11/19 210 lb 08/11/22 225 lb 09/04/21 242 lb 09/18/20   Estimated Energy Needs  Kcals: 2275-2730 Protein: 113-136 g Fluid: 2275-2730 ml  NUTRITION DIAGNOSIS: Unintentional weight loss continues   INTERVENTION:  Continue 350 calorie oral nutrition supplement at least BID Continue 3 full meals a day and 3 snacks between meals Continue to push calories and protein (offer seconds if patient will eat) Previous meal plan given to Isaiah Blakes If weight continues to decline can consider trial of appetite stimulant to see if patient can eat more.   Contact information given to [employer]     NEXT VISIT: no follow-up RD available if needed  Marvens Hollars B. Freida Busman, RD, LDN Registered Dietitian [phone]

## 2023-10-07 ENCOUNTER — Ambulatory Visit: Payer: Medicare Other | Admitting: Family Medicine

## 2023-10-07 ENCOUNTER — Encounter: Payer: Self-pay | Admitting: Family Medicine

## 2023-10-07 VITALS — BP 122/74 | HR 86 | Resp 16 | Ht 75.0 in | Wt 202.2 lb

## 2023-10-07 DIAGNOSIS — I10 Essential (primary) hypertension: Secondary | ICD-10-CM | POA: Diagnosis not present

## 2023-10-07 DIAGNOSIS — N481 Balanitis: Secondary | ICD-10-CM | POA: Diagnosis not present

## 2023-10-07 DIAGNOSIS — D696 Thrombocytopenia, unspecified: Secondary | ICD-10-CM

## 2023-10-07 DIAGNOSIS — Z8639 Personal history of other endocrine, nutritional and metabolic disease: Secondary | ICD-10-CM

## 2023-10-07 DIAGNOSIS — E538 Deficiency of other specified B group vitamins: Secondary | ICD-10-CM

## 2023-10-07 DIAGNOSIS — E785 Hyperlipidemia, unspecified: Secondary | ICD-10-CM

## 2023-10-07 DIAGNOSIS — E440 Moderate protein-calorie malnutrition: Secondary | ICD-10-CM | POA: Diagnosis not present

## 2023-10-07 DIAGNOSIS — F79 Unspecified intellectual disabilities: Secondary | ICD-10-CM

## 2023-10-07 DIAGNOSIS — K219 Gastro-esophageal reflux disease without esophagitis: Secondary | ICD-10-CM | POA: Diagnosis not present

## 2023-10-07 DIAGNOSIS — F3012 Manic episode without psychotic symptoms, moderate: Secondary | ICD-10-CM | POA: Diagnosis not present

## 2023-10-07 DIAGNOSIS — M170 Bilateral primary osteoarthritis of knee: Secondary | ICD-10-CM

## 2023-10-07 DIAGNOSIS — N138 Other obstructive and reflux uropathy: Secondary | ICD-10-CM | POA: Diagnosis not present

## 2023-10-07 DIAGNOSIS — N401 Enlarged prostate with lower urinary tract symptoms: Secondary | ICD-10-CM

## 2023-10-07 MED ORDER — LISINOPRIL 5 MG PO TABS: 5.0000 mg | ORAL_TABLET | Freq: Every day | ORAL | 5 refills | Status: DC

## 2023-10-07 MED ORDER — TAMSULOSIN HCL 0.4 MG PO CAPS: 0.4000 mg | ORAL_CAPSULE | Freq: Every day | ORAL | 5 refills | Status: DC

## 2023-10-07 MED ORDER — NYSTATIN 100000 UNIT/GM EX CREA
TOPICAL_CREAM | Freq: Two times a day (BID) | CUTANEOUS | 5 refills | Status: AC
Start: 2023-10-07 — End: ?

## 2023-10-07 MED ORDER — FINASTERIDE 5 MG PO TABS: 5.0000 mg | ORAL_TABLET | Freq: Every day | ORAL | 5 refills | Status: DC

## 2023-10-07 MED ORDER — FAMOTIDINE 40 MG PO TABS
40.0000 mg | ORAL_TABLET | Freq: Every day | ORAL | 5 refills | Status: DC
Start: 2023-10-07 — End: 2024-05-10

## 2023-10-07 MED ORDER — OMEPRAZOLE 20 MG PO CPDR: DELAYED_RELEASE_CAPSULE | ORAL | 5 refills | Status: DC

## 2023-10-07 MED ORDER — ATORVASTATIN CALCIUM 10 MG PO TABS: 10.0000 mg | ORAL_TABLET | Freq: Every day | ORAL | 5 refills | Status: DC

## 2023-10-07 NOTE — Progress Notes (Signed)
 Name: Wayne Jones   MRN: [id number]    DOB: 14-Aug-1960   Date:10/07/2023       Progress Note  Subjective  Chief Complaint  Chief Complaint  Patient presents with   Medical Management of Chronic Issues   HPI   History of DM: his A1C has been normal without mediations for years. He is on statin therapy No symptoms of hypoglycemia. His weight is still trending down but at slower pace . He follows a low carbohydrate diet at group ome    HTN: he is only on low dose ACE and tolerating medication well. BP has been controlled    BPH: he is now seeing Dr. Laveda Norman but refused to go the past two times and I filled last rx of his medications . He  still masturbates often and causes penile irritation but doing better since he has been using lotion and also topical nystatin prn  He is on Proscar and Flomax, last PSA was normal 08/2022 , he missed his last appointment because he refused to go. Due to his weight loss of unknown cause we will send him to a local urologist    Dyslipidemia: taking statin and Lovaza Reviewed last labs    Bipolar disorder: seeing psychiatrist - Dr Ave Filter he takes Abilify , Duloxetine and gabapentin, clonazepam prn . He is compliant due to living in a group home . He wanders at times but no other behavior problems.    Malnutrition:  Caregiver states he seems to have an normal appetite. His weight  was in the 260 lbs back in 2019, 250 lbs in 2020's he was down  to 231 lbs end of 2022 , down to 206 in 2023 it went up to 210 early 2024 and is has been 202-203 lbs in the past 6 months. . He had EGD and colonoscopy that were unremarkable. He saw Dr. Cathie Hoops and negative work up in 2024. He recently saw nutritionist and was advised to take higher protein shakes , having three meals and snacks   Thrombocytopenia/Splenomegaly: seeing Dr. Cathie Hoops. Per her notes secondary to Abilify and carbamazepine. He is on lower dose of Abilify now , last platelets 122   OA knee: he was given knee braces by  ortho but he does not seem to be wearing a brace consistently, we added meloxicam but he continues to have discomfort and antalgic pain . Last knee injection was Sep 2024 by Dr. Ernest Pine  Thyroid nodule: seen by Dr. Tedd Sias, negative biopsy and normal TSH  GERD: taking mediation, he denies heartburn or indigestion   Patient Active Problem List   Diagnosis Date Noted   Thyroid mass 12/05/2022   Splenomegaly 10/28/2022   Primary osteoarthritis of both knees 01/16/2022   B12 deficiency 01/16/2022   Moderate protein-calorie malnutrition (HCC) 01/16/2022   Bilateral groin pain 12/10/2021   Weight loss, unintentional    Abnormal CT scan, stomach    Polyp of colon    Osteoarthritis of knee 03/26/2020   Varicose veins, lower extremity, with inflammation, ulcerated, unspecified laterality (HCC) 05/26/2017   Bilateral lower extremity edema 04/14/2017   Thrombocytopenia (HCC) 07/20/2016   Intellectual disability 03/17/2016   Polypharmacy 01/10/2016   Benign prostatic hyperplasia without urinary obstruction 01/09/2015   Chronic kidney disease (CKD), stage I 01/09/2015   Cognitive decline 01/09/2015   History of diabetes mellitus 01/09/2015   GERD without esophagitis 01/09/2015   Hearing loss 01/09/2015   Dyslipidemia 01/09/2015   Morbid obesity, unspecified obesity type (HCC) 01/09/2015  Obstructive sleep apnea 01/09/2015   Other specified causes of urethral stricture 01/09/2015   Hernia of anterior abdominal wall 01/09/2015   BPH with obstruction/lower urinary tract symptoms 07/09/2012   Incomplete bladder emptying 07/09/2012   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   Benign essential HTN 02/04/2007   Bipolar I disorder, single manic episode, moderate (HCC) 02/04/2007    Past Surgical History:  Procedure Laterality Date   COLONOSCOPY WITH PROPOFOL N/A 11/19/2021   Procedure: COLONOSCOPY WITH PROPOFOL;  Surgeon: Toney Reil, MD;  Location: [facility] ENDOSCOPY;  Service: Gastroenterology;   Laterality: N/A;   ESOPHAGOGASTRODUODENOSCOPY (EGD) WITH PROPOFOL N/A 11/19/2021   Procedure: ESOPHAGOGASTRODUODENOSCOPY (EGD) WITH PROPOFOL;  Surgeon: Toney Reil, MD;  Location: [facility] ENDOSCOPY;  Service: Gastroenterology;  Laterality: N/A;   LAPAROSCOPIC CHOLECYSTECTOMY     LAPAROSCOPY  12/03/2010   VENTRAL HERNIA REPAIR      Family History  Family history unknown: Yes    Social History   Tobacco Use   Smoking status: Never   Smokeless tobacco: Never  Substance Use Topics   Alcohol use: No    Alcohol/week: 0.0 standard drinks of alcohol     Current Outpatient Medications:    acetaminophen (TYLENOL) 500 MG tablet, Take 500 mg by mouth 3 (three) times daily., Disp: , Rfl:    antiseptic oral rinse (BIOTENE) LIQD, 15 mLs by Mouth Rinse route as needed for dry mouth., Disp: , Rfl:    aripiprazole (ABILIFY) 10 MG disintegrating tablet, Take 10 mg by mouth daily., Disp: , Rfl:    atorvastatin (LIPITOR) 10 MG tablet, Take 1 tablet (10 mg total) by mouth daily., Disp: 30 tablet, Rfl: 5   carbamazepine (EQUETRO) 200 MG CP12 12 hr capsule, Take 200 mg by mouth 2 (two) times daily., Disp: , Rfl:    carbamide peroxide (DEBROX) 6.5 % OTIC solution, PLACE 5 DROPS INTO BOTH EARS TWO TIMES A DAY, Disp: 15 mL, Rfl: 0   clonazePAM (KLONOPIN) 1 MG tablet, Take 1 mg by mouth daily., Disp: , Rfl:    Cyanocobalamin 1000 MCG/15ML LIQD, Take 5 mLs (333.3333 mcg total) by mouth daily., Disp: 450 mL, Rfl: 5   DENTA 5000 PLUS 1.1 % CREA dental cream, Take by mouth., Disp: , Rfl:    DULoxetine (CYMBALTA) 60 MG capsule, Take 60 mg by mouth daily., Disp: , Rfl:    Elastic Bandages & Supports (MEDICAL COMPRESSION SOCKS) MISC, 2 each by Does not apply route daily. Apply in am's and remove it at bedtime, Disp: 2 each, Rfl: 1   famotidine (PEPCID) 40 MG tablet, Take 1 tablet (40 mg total) by mouth daily., Disp: 30 tablet, Rfl: 5   feeding supplement, ENSURE COMPLETE, (ENSURE COMPLETE) LIQD, Take 237 mLs by  mouth 2 (two) times daily between meals., Disp: 237 mL, Rfl: 3   fexofenadine (ALLEGRA) 60 MG tablet, TAKE 1 TABLET BY MOUTH TWICE DAILY, Disp: 14 tablet, Rfl: 10   fluticasone (FLONASE) 50 MCG/ACT nasal spray, Place 2 sprays into both nostrils daily., Disp: 16 g, Rfl: 6   gabapentin (NEURONTIN) 300 MG capsule, Take 300 mg by mouth in the morning., Disp: , Rfl:    gabapentin (NEURONTIN) 600 MG tablet, Take 600 mg by mouth at bedtime., Disp: , Rfl:    lisinopril (ZESTRIL) 5 MG tablet, Take 1 tablet (5 mg total) by mouth daily., Disp: 30 tablet, Rfl: 5   Lubricants (K-Y JELLY) GEL, USE  AS NEEDED TO BE ABLE TO MASTURBATE, Disp: 57 g, Rfl: 10   meloxicam (MOBIC)  15 MG tablet, Take 1 tablet (15 mg total) by mouth daily as needed for pain. Please don't give it daily, Disp: 30 tablet, Rfl: 1   nystatin cream (MYCOSTATIN), Apply topically 2 (two) times daily., Disp: 30 g, Rfl: 10   omega-3 acid ethyl esters (LOVAZA) 1 g capsule, TAKE 1 CAPSULE BY MOUTH TWICE DAILY  *DO NOT CRUSH OR CHEW*, Disp: 2 capsule, Rfl: 10   omeprazole (PRILOSEC) 20 MG capsule, TAKE 1 CAPSULE BY MOUTH ONCE DAILY *DO NOT CRUSH OR CHEW*, Disp: 30 capsule, Rfl: 5   Polyethyl Glycol-Propyl Glycol (SYSTANE) 0.4-0.3 % SOLN, Apply to eye., Disp: , Rfl:    tamsulosin (FLOMAX) 0.4 MG CAPS capsule, TAKE 1 CAPSULE BY MOUTH ONCE DAILY  *DO NOT CRUSH OR CHEW* **DO NOT OPEN CAPSULE**, Disp: 30 capsule, Rfl: 1   Vitamin D, Ergocalciferol, (DRISDOL) 1.25 MG (50000 UNIT) CAPS capsule, Take 50,000 Units by mouth every 14 (fourteen) days., Disp: , Rfl:    Vitamins A & D (A+D PREVENT) OINT ointment, APPLY TOPICALLY AS NEEDED FOR DRY SKIN, Disp: 113 g, Rfl: 11   finasteride (PROSCAR) 5 MG tablet, Take 5 mg by mouth daily. (Patient not taking: Reported on 10/07/2023), Disp: , Rfl:    ketoconazole (NIZORAL) 2 % cream, APPLY TOPICALLY 1 APPLICATION TO GROIN RASH ONCE DAILY (Patient not taking: Reported on 10/07/2023), Disp: 60 g, Rfl: 11  Current  Facility-Administered Medications:    nystatin (MYCOSTATIN/NYSTOP) topical powder, , Topical, PRN, Alba Cory, MD  No Known Allergies  I personally reviewed active problem list, medication list, allergies, family history with the patient/caregiver today.   ROS  Ten systems reviewed and is negative except as mentioned in HPI    Objective  Vitals:   10/07/23 0948  BP: 122/74  Pulse: 86  Resp: 16  SpO2: 98%  Weight: 202 lb 3.2 oz (91.7 kg)  Height: 6\' 3"  (1.905 m)    Body mass index is 25.27 kg/m.  Physical Exam  Constitutional: Patient appears well-developed and well-nourished.  No distress.  HEENT: head atraumatic, normocephalic, pupils equal and reactive to light, neck supple Cardiovascular: Normal rate, regular rhythm and normal heart sounds.  No murmur heard. No BLE edema. Pulmonary/Chest: Effort normal and breath sounds normal. No respiratory distress. Abdominal: Soft.  There is no tenderness. Psychiatric: Patient has a normal mood and affect. behavior is normal. Judgment and thought content normal.   Recent Results (from the past 2160 hours)  HM DIABETES EYE EXAM     Status: None   Collection Time: 08/04/23  9:38 AM  Result Value Ref Range   HM Diabetic Eye Exam No Retinopathy No Retinopathy    Comment: ABSTRACTED BY HIM    Diabetic Foot Exam:     PHQ2/9:    10/07/2023    9:51 AM 07/16/2023   11:21 AM 04/09/2023    8:36 AM 08/11/2022    8:31 AM 07/10/2022   11:05 AM  Depression screen PHQ 2/9  Decreased Interest 0 0 0 0 0  Down, Depressed, Hopeless 0 0 0 0 0  PHQ - 2 Score 0 0 0 0 0  Altered sleeping 0 0 0 0 0  Tired, decreased energy 0 0 0 0 0  Change in appetite 0 0 0 0 0  Feeling bad or failure about yourself  0 0 0 0 0  Trouble concentrating 0 0 0 0 0  Moving slowly or fidgety/restless 0 0 0 0 0  Suicidal thoughts 0 0 0 0 0  PHQ-9 Score 0 0 0 0 0  Difficult doing work/chores Not difficult at all Not difficult at all       phq 9 is  negative  Fall Risk:    07/16/2023   11:24 AM 04/09/2023    8:36 AM 08/11/2022    8:31 AM 07/10/2022   11:08 AM 04/08/2022    1:47 PM  Fall Risk   Falls in the past year? 1 1 0 0 0  Number falls in past yr: 1 0 0    Injury with Fall? 1 1 0    Risk for fall due to : History of fall(s);Other (Comment) Impaired balance/gait No Fall Risks No Fall Risks No Fall Risks  Risk for fall due to: Comment TERRIBLE KNEE PAIN      Follow up Falls evaluation completed;Falls prevention discussed Falls prevention discussed Falls prevention discussed Education provided;Falls prevention discussed Falls prevention discussed;Education provided;Falls evaluation completed     Assessment & Plan  1. Bipolar I disorder, single manic episode, moderate (HCC) (Primary)  Stable, sees Dr. Ave Filter   2. Moderate protein-calorie malnutrition (HCC)  Seen by nutritionist and negative work up done by hematologist / oncologist   3. Thrombocytopenia (HCC)  Monitored by Dr. Cathie Hoops  4. B12 deficiency  Taking supplements  5. Dyslipidemia  - atorvastatin (LIPITOR) 10 MG tablet; Take 1 tablet (10 mg total) by mouth daily.  Dispense: 30 tablet; Refill: 5  6. Primary osteoarthritis of both knees  Under the care of Ortho  7. GERD without esophagitis  - famotidine (PEPCID) 40 MG tablet; Take 1 tablet (40 mg total) by mouth daily.  Dispense: 30 tablet; Refill: 5 - omeprazole (PRILOSEC) 20 MG capsule; TAKE 1 CAPSULE BY MOUTH ONCE DAILY *DO NOT CRUSH OR CHEW*  Dispense: 30 capsule; Refill: 5  8. Benign essential HTN  - lisinopril (ZESTRIL) 5 MG tablet; Take 1 tablet (5 mg total) by mouth daily.  Dispense: 30 tablet; Refill: 5  9. Intellectual disability  - Ambulatory referral to Urology  10. History of diabetes mellitus   11. Balanitis  - nystatin cream (MYCOSTATIN); Apply topically 2 (two) times daily.  Dispense: 30 g; Refill: 5 - Ambulatory referral to Urology  12. BPH with obstruction/lower urinary tract  symptoms  - finasteride (PROSCAR) 5 MG tablet; Take 1 tablet (5 mg total) by mouth daily.  Dispense: 30 tablet; Refill: 5 - tamsulosin (FLOMAX) 0.4 MG CAPS capsule; Take 1 capsule (0.4 mg total) by mouth daily.  Dispense: 30 capsule; Refill: 5 - Ambulatory referral to Urology

## 2023-11-23 ENCOUNTER — Other Ambulatory Visit: Payer: Self-pay | Admitting: Family Medicine

## 2023-11-23 DIAGNOSIS — J309 Allergic rhinitis, unspecified: Secondary | ICD-10-CM

## 2023-12-01 ENCOUNTER — Other Ambulatory Visit: Payer: Self-pay | Admitting: Family Medicine

## 2023-12-01 DIAGNOSIS — J309 Allergic rhinitis, unspecified: Secondary | ICD-10-CM

## 2023-12-02 ENCOUNTER — Ambulatory Visit (INDEPENDENT_AMBULATORY_CARE_PROVIDER_SITE_OTHER): Admitting: Urology

## 2023-12-02 ENCOUNTER — Telehealth: Payer: Self-pay

## 2023-12-02 VITALS — BP 121/80 | HR 73 | Ht 72.0 in | Wt 198.2 lb

## 2023-12-02 DIAGNOSIS — N138 Other obstructive and reflux uropathy: Secondary | ICD-10-CM | POA: Diagnosis not present

## 2023-12-02 DIAGNOSIS — N401 Enlarged prostate with lower urinary tract symptoms: Secondary | ICD-10-CM | POA: Diagnosis not present

## 2023-12-02 DIAGNOSIS — N4889 Other specified disorders of penis: Secondary | ICD-10-CM

## 2023-12-02 DIAGNOSIS — N5082 Scrotal pain: Secondary | ICD-10-CM

## 2023-12-02 LAB — URINALYSIS, COMPLETE
Bilirubin, UA: NEGATIVE
Glucose, UA: NEGATIVE
Ketones, UA: NEGATIVE
Leukocytes,UA: NEGATIVE
Nitrite, UA: NEGATIVE
Protein,UA: NEGATIVE
RBC, UA: NEGATIVE
Specific Gravity, UA: 1.015 (ref 1.005–1.030)
Urobilinogen, Ur: 0.2 mg/dL (ref 0.2–1.0)
pH, UA: 5.5 (ref 5.0–7.5)

## 2023-12-02 LAB — MICROSCOPIC EXAMINATION: Epithelial Cells (non renal): >10 /HPF — AB (ref 0–10)

## 2023-12-02 LAB — BLADDER SCAN AMB NON-IMAGING: Scan Result: 50

## 2023-12-02 NOTE — Progress Notes (Addendum)
 [name],acting as a scribe for Wayne Gimenez, MD.,have documented all relevant documentation on the behalf of Wayne Gimenez, MD,as directed by  Wayne Gimenez, MD while in the presence of Wayne Gimenez, MD.  12/02/23 11:53 AM   Wayne Jones 01/08/1961 [id number]  Referring provider: Arleen Lacer, MD [address] [place],  [place] [address]  Chief Complaint  Patient presents with   Benign Prostatic Hypertrophy    HPI: 63 year old male with a personal history of BPH managed on Flomax  and finasteride  presents today from a group home accompanied by an attendant. We did receive permission from his healthcare proxy for treatment today.    He has been seeing urology at [facility] and was last seen just over a year ago. At that time, he had some incomplete bladder emptying. He had also been experiencing penile and scrotal irritation due to excessive masturbation, which has been an ongoing issue. He was previously advised to use lubricants and was started on an antifungal cream as a precaution.   He had a PSA of 0.3 in January 2024 and a negative RCT scan in 2023 showing a small prostate.   Recently, He has experienced some urinary leakage and possible blood in the urine(questionable), although no UTIs have been documented. He complains of penile pain and scrotal pain, and his caretaker reports no significant urinary issues.   He is a poor historian, and the caretaker provides most of the information.  Results for orders placed or performed in visit on 12/02/23  Microscopic Examination   Urine  Result Value Ref Range   WBC, UA 0-5 0 - 5 /hpf   RBC, Urine 0-2 0 - 2 /hpf   Epithelial Cells (non renal) >10 (A) 0 - 10 /hpf   Bacteria, UA Few None seen/Few  Urinalysis, Complete  Result Value Ref Range   Specific Gravity, UA 1.015 1.005 - 1.030   pH, UA 5.5 5.0 - 7.5   Color, UA Yellow Yellow   Appearance Ur Clear Clear   Leukocytes,UA Negative Negative    Protein,UA Negative Negative/Trace   Glucose, UA Negative Negative   Ketones, UA Negative Negative   RBC, UA Negative Negative   Bilirubin, UA Negative Negative   Urobilinogen, Ur 0.2 0.2 - 1.0 mg/dL   Nitrite, UA Negative Negative   Microscopic Examination See below:   Bladder Scan (Post Void Residual) in office  Result Value Ref Range   Scan Result 50 ml     IPSS     Row Name 12/02/23 0900         International Prostate Symptom Score   How often have you had the sensation of not emptying your bladder? About half the time     How often have you had to urinate less than every two hours? About half the time     How often have you found you stopped and started again several times when you urinated? About half the time     How often have you found it difficult to postpone urination? About half the time     How often have you had a weak urinary stream? Less than 1 in 5 times     How often have you had to strain to start urination? About half the time     How many times did you typically get up at night to urinate? 1 Time     Total IPSS Score 17  Score:  1-7 Mild 8-19 Moderate 20-35 Severe   PMH: Past Medical History:  Diagnosis Date   Depression    Diabetes mellitus without complication (HCC)    Hyperlipidemia    Hypertension    Mental disorder     Surgical History: Past Surgical History:  Procedure Laterality Date   COLONOSCOPY WITH PROPOFOL  N/A 11/19/2021   Procedure: COLONOSCOPY WITH PROPOFOL ;  Surgeon: Selena Daily, MD;  Location: [facility] ENDOSCOPY;  Service: Gastroenterology;  Laterality: N/A;   ESOPHAGOGASTRODUODENOSCOPY (EGD) WITH PROPOFOL  N/A 11/19/2021   Procedure: ESOPHAGOGASTRODUODENOSCOPY (EGD) WITH PROPOFOL ;  Surgeon: Selena Daily, MD;  Location: [facility] ENDOSCOPY;  Service: Gastroenterology;  Laterality: N/A;   LAPAROSCOPIC CHOLECYSTECTOMY     LAPAROSCOPY  12/03/2010   VENTRAL HERNIA REPAIR      Home Medications:  Allergies as  of 12/02/2023   No Known Allergies      Medication List        Accurate as of December 02, 2023 11:53 AM. If you have any questions, ask your nurse or doctor.          A+D Prevent Oint ointment APPLY TOPICALLY AS NEEDED FOR DRY SKIN   acetaminophen 500 MG tablet Commonly known as: TYLENOL Take 500 mg by mouth 3 (three) times daily.   antiseptic oral rinse Liqd 15 mLs by Mouth Rinse route as needed for dry mouth.   aripiprazole 10 MG disintegrating tablet Commonly known as: ABILIFY Take 10 mg by mouth daily.   atorvastatin  10 MG tablet Commonly known as: LIPITOR Take 1 tablet (10 mg total) by mouth daily.   carbamazepine  200 MG Cp12 12 hr capsule Commonly known as: EQUETRO  Take 200 mg by mouth 2 (two) times daily.   carbamide peroxide 6.5 % OTIC solution Commonly known as: DEBROX PLACE 5 DROPS INTO BOTH EARS TWO TIMES A DAY   clonazePAM 1 MG tablet Commonly known as: KLONOPIN Take 1 mg by mouth daily.   Cyanocobalamin  1000 MCG/15ML Liqd Take 5 mLs (333.3333 mcg total) by mouth daily.   DULoxetine 60 MG capsule Commonly known as: CYMBALTA Take 60 mg by mouth daily.   famotidine  40 MG tablet Commonly known as: PEPCID  Take 1 tablet (40 mg total) by mouth daily.   feeding supplement (ENSURE COMPLETE) Liqd Take 237 mLs by mouth 2 (two) times daily between meals.   fexofenadine  60 MG tablet Commonly known as: ALLEGRA  TAKE 1 TABLET BY MOUTH TWICE DAILY   finasteride  5 MG tablet Commonly known as: PROSCAR  Take 1 tablet (5 mg total) by mouth daily.   fluticasone  50 MCG/ACT nasal spray Commonly known as: FLONASE  Place 2 sprays into both nostrils daily.   gabapentin 600 MG tablet Commonly known as: NEURONTIN Take 600 mg by mouth at bedtime.   gabapentin 300 MG capsule Commonly known as: NEURONTIN Take 300 mg by mouth in the morning.   K-Y Jelly Gel USE  AS NEEDED TO BE ABLE TO MASTURBATE   lisinopril  5 MG tablet Commonly known as: ZESTRIL  Take 1  tablet (5 mg total) by mouth daily.   Medical Compression Socks Misc 2 each by Does not apply route daily. Apply in am's and remove it at bedtime   meloxicam  15 MG tablet Commonly known as: MOBIC  Take 1 tablet (15 mg total) by mouth daily as needed for pain. Please don't give it daily   nystatin  cream Commonly known as: MYCOSTATIN  Apply topically 2 (two) times daily.   omega-3 acid ethyl esters 1 g capsule Commonly known as: LOVAZA   TAKE 1 CAPSULE BY MOUTH TWICE DAILY  *DO NOT CRUSH OR CHEW*   omeprazole  20 MG capsule Commonly known as: PRILOSEC TAKE 1 CAPSULE BY MOUTH ONCE DAILY *DO NOT CRUSH OR CHEW*   Systane 0.4-0.3 % Soln Generic drug: Polyethyl Glycol-Propyl Glycol Apply to eye.   tamsulosin  0.4 MG Caps capsule Commonly known as: FLOMAX  Take 1 capsule (0.4 mg total) by mouth daily.   Vitamin D (Ergocalciferol) 1.25 MG (50000 UNIT) Caps capsule Commonly known as: DRISDOL Take 50,000 Units by mouth every 14 (fourteen) days.         Family History: Family History  Family history unknown: Yes    Social History:  reports that he has never smoked. He has never used smokeless tobacco. He reports that he does not drink alcohol and does not use drugs.   Physical Exam: BP 121/80   Pulse 73   Ht 6' (1.829 m)   Wt 198 lb 4 oz (89.9 kg)   BMI 26.89 kg/m   Constitutional:  Alert and oriented, No acute distress.  Exam chaperoned by attendant, Lenn Quint. HEENT: [place] AT, moist mucus membranes.  Trachea midline, no masses. GU: Normal bilateral testicles. Normal exam with no signs of infection.  Normal circumcised phallus without any rashes or skin changes.  Orthotopic meatus which is patent, no discharge. Neurologic: Grossly intact, no focal deficits, moving all 4 extremities. Psychiatric: Normal mood and affect.   Pertinent Imaging: EXAM: CT ABDOMEN AND PELVIS WITH CONTRAST   TECHNIQUE: Multidetector CT imaging of the abdomen and pelvis was performed using the  standard protocol following bolus administration of intravenous contrast.   RADIATION DOSE REDUCTION: This exam was performed according to the departmental dose-optimization program which includes automated exposure control, adjustment of the mA and/or kV according to patient size and/or use of iterative reconstruction technique.   CONTRAST:  OMNIPAQUE  IOHEXOL  300 MG/ML  SOLN   COMPARISON:  CT abdomen and pelvis 10/08/2011   FINDINGS: Lower chest: Chronic pleural and parenchymal scarring densities in the lower lungs.   Hepatobiliary: No focal liver abnormality is seen. Status post cholecystectomy. No biliary dilatation.   Pancreas: Unremarkable. No pancreatic ductal dilatation or surrounding inflammatory changes.   Spleen: Enlarged measuring 15.9 cm in length.   Adrenals/Urinary Tract: Adrenal glands appear normal. Kidneys are normal. No hydronephrosis. Mild urinary bladder wall thickening and adjacent fat stranding.   Stomach/Bowel: No bowel obstruction, free air or pneumatosis. There are multiple (at least 4) short segments of circumferential small bowel wall thickening with adjacent mesenteric hazy fat stranding identified involving ileal loops mostly in the left abdomen (axial image 45, 49, 54, 57 and also seen well on the coronal plane). There are also at least 2 short segments of apparent wall thickening visualized in the distal descending/proximal sigmoid colon. The distal terminal ileum appears grossly normal. Mild colonic diverticulosis. Trace amounts of fluid are also noted adjacent to the segment of sigmoid colon thickening as well as in the right lower quadrant near the cecum.   Appendix is normal.   Vascular/Lymphatic: No significant vascular findings are present. No enlarged abdominal or pelvic lymph nodes.   Reproductive: Prostate is unremarkable.   Other: None.   Musculoskeletal: No suspicious bony lesions identified.   IMPRESSION: 1. Multiple  short segments of bowel wall thickening with adjacent mesenteric fat stranding and trace fluid, involving the ileum and colon as described. Findings are nonspecific and can be seen with inflammatory bowel disease skip lesions such as Crohn's disease. Correlate clinically and  recommend follow-up with GI. 2. Splenomegaly. 3. Mild urinary bladder wall thickening and adjacent fat stranding, correlate for possible cystitis.     Electronically Signed   By: Armond Lands M.D.   On: 10/24/2021 15:55  This was personally reviewed and I agree with the radiologic interpretation.   Assessment & Plan:    1. Penile and scrotal pain - He presents with penile and scrotal pain, possibly related to urethral / penile irritation from chronic masturbation.  - The physical examination was reassuring with no visible signs of infection or significant abnormalities.  - A urinalysis is pending to rule out any urinary tract infection or other underlying issues.  - Continue supportive care and advise the use of lubricants during masturbation to prevent irritation.  2. History of incomplete bladder emptying - He has a history of incomplete bladder emptying, but current urinary symptoms are reasonably well controlled.  - Continue current medications, Flomax  and Finasteride , as they appear to be managing symptoms effectively.   Return in about 1 month (around 01/01/2024) for follow up or sooner if symptoms worsen or new symptoms develop.  I have reviewed the above documentation for accuracy and completeness, and I agree with the above.   Wayne Gimenez, MD    [facility] [address], [address] [place], [place] [address] [phone]  Addendum: Urinalysis was negative.  No signs of infection.  Facility was made aware.

## 2023-12-02 NOTE — Telephone Encounter (Signed)
 Called number on file for [facility] caregiver (was told by 2 different people that picked up that this was the wrong number-need to verify and update this) and [facility] left message to call back. Just need to let them know that his UA from today's visit is normal.

## 2023-12-08 DIAGNOSIS — F3489 Other specified persistent mood disorders: Secondary | ICD-10-CM | POA: Diagnosis not present

## 2023-12-09 ENCOUNTER — Inpatient Hospital Stay: Payer: Medicare Other | Attending: Oncology

## 2023-12-09 ENCOUNTER — Inpatient Hospital Stay (HOSPITAL_BASED_OUTPATIENT_CLINIC_OR_DEPARTMENT_OTHER): Payer: Medicare Other | Admitting: Oncology

## 2023-12-09 ENCOUNTER — Encounter: Payer: Self-pay | Admitting: Oncology

## 2023-12-09 VITALS — BP 127/80 | HR 67 | Temp 97.5°F | Resp 17 | Wt 196.0 lb

## 2023-12-09 DIAGNOSIS — R161 Splenomegaly, not elsewhere classified: Secondary | ICD-10-CM | POA: Insufficient documentation

## 2023-12-09 DIAGNOSIS — D696 Thrombocytopenia, unspecified: Secondary | ICD-10-CM

## 2023-12-09 DIAGNOSIS — R634 Abnormal weight loss: Secondary | ICD-10-CM | POA: Insufficient documentation

## 2023-12-09 DIAGNOSIS — E079 Disorder of thyroid, unspecified: Secondary | ICD-10-CM | POA: Diagnosis not present

## 2023-12-09 LAB — CMP (CANCER CENTER ONLY)
ALT: 18 U/L (ref 0–44)
AST: 21 U/L (ref 15–41)
Albumin: 4.1 g/dL (ref 3.5–5.0)
Alkaline Phosphatase: 71 U/L (ref 38–126)
Anion gap: 8 (ref 5–15)
BUN: 14 mg/dL (ref 8–23)
CO2: 27 mmol/L (ref 22–32)
Calcium: 8.8 mg/dL — ABNORMAL LOW (ref 8.9–10.3)
Chloride: 100 mmol/L (ref 98–111)
Creatinine: 0.68 mg/dL (ref 0.61–1.24)
GFR, Estimated: >60 mL/min (ref >60)
Glucose, Bld: 90 mg/dL (ref 70–99)
Potassium: 4 mmol/L (ref 3.5–5.1)
Sodium: 135 mmol/L (ref 135–145)
Total Bilirubin: 0.7 mg/dL (ref 0.0–1.2)
Total Protein: 6.6 g/dL (ref 6.5–8.1)

## 2023-12-09 LAB — CBC WITH DIFFERENTIAL (CANCER CENTER ONLY)
Abs Immature Granulocytes: 0.02 10*3/uL (ref 0.00–0.07)
Basophils Absolute: 0 10*3/uL (ref 0.0–0.1)
Basophils Relative: 1 %
Eosinophils Absolute: 0 10*3/uL (ref 0.0–0.5)
Eosinophils Relative: 1 %
HCT: 38.5 % — ABNORMAL LOW (ref 39.0–52.0)
Hemoglobin: 13.9 g/dL (ref 13.0–17.0)
Immature Granulocytes: 0 %
Lymphocytes Relative: 15 %
Lymphs Abs: 0.9 10*3/uL (ref 0.7–4.0)
MCH: 33.3 pg (ref 26.0–34.0)
MCHC: 36.1 g/dL — ABNORMAL HIGH (ref 30.0–36.0)
MCV: 92.3 fL (ref 80.0–100.0)
Monocytes Absolute: 0.5 10*3/uL (ref 0.1–1.0)
Monocytes Relative: 9 %
Neutro Abs: 4.4 10*3/uL (ref 1.7–7.7)
Neutrophils Relative %: 74 %
Platelet Count: 129 10*3/uL — ABNORMAL LOW (ref 150–400)
RBC: 4.17 MIL/uL — ABNORMAL LOW (ref 4.22–5.81)
RDW: 11.9 % (ref 11.5–15.5)
WBC Count: 5.8 10*3/uL (ref 4.0–10.5)
nRBC: 0 % (ref 0.0–0.2)

## 2023-12-09 NOTE — Assessment & Plan Note (Signed)
Thrombocytopenia, platelet counts are  Stable.  This could be secondary to carbamazepine/Abilify, or due to splenomegaly. Continue observation.

## 2023-12-09 NOTE — Assessment & Plan Note (Signed)
 Persistent mild splenomegaly on US  JAK2 V617F mutation negative, with reflex to other mutations CALR, MPL, JAK 2 Ex 12-15 mutations negative. Low ANA titer 1:80, EBV/CMV negative.  Observation.  Repeat left upper quadrant ultrasound.  For follow-up.

## 2023-12-09 NOTE — Assessment & Plan Note (Signed)
 Previous workup findings not able to explain the patient's history of weight loss Observation.

## 2023-12-09 NOTE — Assessment & Plan Note (Signed)
 US  thyroid  was obtained.  Biopsy was negative per PCPs note.  Results not available to me.

## 2023-12-09 NOTE — Progress Notes (Signed)
 Hematology/Oncology Progress note [phone] [phone]     CHIEF COMPLAINTS/REASON FOR VISIT:  thrombocytopenia, weight loss   ASSESSMENT & PLAN:   Thrombocytopenia (HCC) Thrombocytopenia, platelet counts are Stable.   This could be secondary to carbamazepine /Abilify, or due to splenomegaly. Continue observation.  Splenomegaly Persistent mild splenomegaly on US  JAK2 V617F mutation negative, with reflex to other mutations CALR, MPL, JAK 2 Ex 12-15 mutations negative. Low ANA titer 1:80, EBV/CMV negative.  Observation.  Repeat left upper quadrant ultrasound.  For follow-up.   Thyroid  mass US  thyroid  was obtained.  Biopsy was negative per PCPs note.  Results not available to me.  Weight loss, unintentional Previous workup findings not able to explain the patient's history of weight loss Observation.    Orders Placed This Encounter  Procedures   US  Abdomen Complete    Standing Status:   Future    Expected Date:   12/23/2023    Expiration Date:   12/08/2024    Reason for Exam (SYMPTOM  OR DIAGNOSIS REQUIRED):   splenomegaly follow up- LUQ    Preferred imaging location?:   [facility]   CBC with Differential (Cancer Center Only)    Standing Status:   Future    Expected Date:   06/10/2024    Expiration Date:   12/08/2024   CMP (Cancer Center only)    Standing Status:   Future    Expected Date:   06/10/2024    Expiration Date:   12/08/2024   Lactate dehydrogenase    Standing Status:   Future    Expected Date:   06/10/2024    Expiration Date:   12/08/2024   Follow-up 6 months.  All questions were answered. The patient knows to call the clinic with any problems, questions or concerns.  Timmy Forbes, MD, PhD [facility] Hematology Oncology 12/09/2023    HISTORY OF PRESENTING ILLNESS:  Wayne Jones is a 63 y.o. male who was seen in consultation at the request of Sowles, Krichna, MD for evaluation of thrombocytopenia   Reviewed patient's labs done  previously.  12/04/2020 labs showed decreased platelet counts at 99,000.  Normal wbc  hemoglobin  Reviewed patient's previous labs. Thrombocytopenia is chronic chronic onset , since at least 2014 with baseline around 100,000. Patient has a history of mental disorders and lives in a group home.  He is a poor historian.  Denies any new complaints.  Denies any easy bleeding or bruising.  He was accompanied by staff member from the group home.  Staff denies any recent change of Abilify and carbamazepine .  INTERVAL HISTORY Wayne Jones is a 63 y.o. male who has above history reviewed by me today presents for follow up visit for management of thrombocytopenia and weight loss Patient was accompanied by group home staff.   Weight loss 4 pounds since 6 months ago Patient is a poor historian. Per group home staff, patient is eating ok.    Review of Systems  Unable to perform ROS: Other (Mental disorder)  Constitutional:  Negative for appetite change, fatigue and unexpected weight change.  Respiratory:  Negative for cough and shortness of breath.   Cardiovascular:  Negative for chest pain.  Genitourinary:  Negative for dysuria.   Hematological:  Negative for adenopathy. Does not bruise/bleed easily.    MEDICAL HISTORY:  Past Medical History:  Diagnosis Date   Depression    Diabetes mellitus without complication (HCC)    Hyperlipidemia    Hypertension    Mental disorder  SURGICAL HISTORY: Past Surgical History:  Procedure Laterality Date   COLONOSCOPY WITH PROPOFOL  N/A 11/19/2021   Procedure: COLONOSCOPY WITH PROPOFOL ;  Surgeon: Selena Daily, MD;  Location: [facility] ENDOSCOPY;  Service: Gastroenterology;  Laterality: N/A;   ESOPHAGOGASTRODUODENOSCOPY (EGD) WITH PROPOFOL  N/A 11/19/2021   Procedure: ESOPHAGOGASTRODUODENOSCOPY (EGD) WITH PROPOFOL ;  Surgeon: Selena Daily, MD;  Location: [facility] ENDOSCOPY;  Service: Gastroenterology;  Laterality: N/A;   LAPAROSCOPIC CHOLECYSTECTOMY      LAPAROSCOPY  12/03/2010   VENTRAL HERNIA REPAIR      SOCIAL HISTORY: Social History   Socioeconomic History   Marital status: Single    Spouse name: Not on file   Number of children: Not on file   Years of education: Not on file   Highest education level: Not on file  Occupational History   Occupation: disabled    Comment: Patient in care home  Tobacco Use   Smoking status: Never   Smokeless tobacco: Never  Vaping Use   Vaping status: Never Used  Substance and Sexual Activity   Alcohol use: No    Alcohol/week: 0.0 standard drinks of alcohol   Drug use: No   Sexual activity: Never  Other Topics Concern   Not on file  Social History Narrative   Lacinda Pica calls him twice weekly. Pt resides at [facility] group home at [facility].    Social Drivers of Health   Financial Resource Strain: Low Risk  (07/16/2023)   Overall Financial Resource Strain (CARDIA)    Difficulty of Paying Living Expenses: Not hard at all  Food Insecurity: No Food Insecurity (07/16/2023)   Hunger Vital Sign    Worried About Running Out of Food in the Last Year: Never true    Ran Out of Food in the Last Year: Never true  Transportation Needs: No Transportation Needs (07/16/2023)   PRAPARE - Administrator, Civil Service (Medical): No    Lack of Transportation (Non-Medical): No  Physical Activity: Sufficiently Active (07/16/2023)   Exercise Vital Sign    Days of Exercise per Week: 5 days    Minutes of Exercise per Session: 30 min  Stress: No Stress Concern Present (07/16/2023)   [employer] of Occupational Health - Occupational Stress Questionnaire    Feeling of Stress : Not at all  Social Connections: Socially Isolated (07/16/2023)   Social Connection and Isolation Panel [NHANES]    Frequency of Communication with Friends and Family: Twice a week    Frequency of Social Gatherings with Friends and Family: Twice a week    Attends Religious Services: Never    Doctor, general practice or Organizations: No    Attends Banker Meetings: Never    Marital Status: Never married  Intimate Partner Violence: Not At Risk (07/16/2023)   Humiliation, Afraid, Rape, and Kick questionnaire    Fear of Current or Ex-Partner: No    Emotionally Abused: No    Physically Abused: No    Sexually Abused: No    FAMILY HISTORY: Family History  Family history unknown: Yes    ALLERGIES:  has no known allergies.  MEDICATIONS:  Current Outpatient Medications  Medication Sig Dispense Refill   acetaminophen (TYLENOL) 500 MG tablet Take 500 mg by mouth 3 (three) times daily.     antiseptic oral rinse (BIOTENE) LIQD 15 mLs by Mouth Rinse route as needed for dry mouth.     aripiprazole (ABILIFY) 10 MG disintegrating tablet Take 10 mg by mouth daily.  atorvastatin  (LIPITOR) 10 MG tablet Take 1 tablet (10 mg total) by mouth daily. 30 tablet 5   carbamazepine  (EQUETRO ) 200 MG CP12 12 hr capsule Take 200 mg by mouth 2 (two) times daily.     carbamide peroxide (DEBROX) 6.5 % OTIC solution PLACE 5 DROPS INTO BOTH EARS TWO TIMES A DAY 15 mL 0   clonazePAM (KLONOPIN) 1 MG tablet Take 1 mg by mouth daily.     Cyanocobalamin  1000 MCG/15ML LIQD Take 5 mLs (333.3333 mcg total) by mouth daily. 450 mL 5   DULoxetine (CYMBALTA) 60 MG capsule Take 60 mg by mouth daily.     Elastic Bandages & Supports (MEDICAL COMPRESSION SOCKS) MISC 2 each by Does not apply route daily. Apply in am's and remove it at bedtime 2 each 1   famotidine  (PEPCID ) 40 MG tablet Take 1 tablet (40 mg total) by mouth daily. 30 tablet 5   feeding supplement, ENSURE COMPLETE, (ENSURE COMPLETE) LIQD Take 237 mLs by mouth 2 (two) times daily between meals. 237 mL 3   fexofenadine  (ALLEGRA ) 60 MG tablet TAKE 1 TABLET BY MOUTH TWICE DAILY 14 tablet 10   finasteride  (PROSCAR ) 5 MG tablet Take 1 tablet (5 mg total) by mouth daily. 30 tablet 5   fluticasone  (FLONASE ) 50 MCG/ACT nasal spray Place 2 sprays into both nostrils  daily. 16 g 6   gabapentin (NEURONTIN) 300 MG capsule Take 300 mg by mouth in the morning.     gabapentin (NEURONTIN) 600 MG tablet Take 600 mg by mouth at bedtime.     lisinopril  (ZESTRIL ) 5 MG tablet Take 1 tablet (5 mg total) by mouth daily. 30 tablet 5   Lubricants (K-Y JELLY) GEL USE  AS NEEDED TO BE ABLE TO MASTURBATE 57 g 10   meloxicam  (MOBIC ) 15 MG tablet Take 1 tablet (15 mg total) by mouth daily as needed for pain. Please don't give it daily 30 tablet 1   nystatin  cream (MYCOSTATIN ) Apply topically 2 (two) times daily. 30 g 5   omega-3 acid ethyl esters (LOVAZA ) 1 g capsule TAKE 1 CAPSULE BY MOUTH TWICE DAILY  *DO NOT CRUSH OR CHEW* 2 capsule 10   omeprazole  (PRILOSEC) 20 MG capsule TAKE 1 CAPSULE BY MOUTH ONCE DAILY *DO NOT CRUSH OR CHEW* 30 capsule 5   Polyethyl Glycol-Propyl Glycol (SYSTANE) 0.4-0.3 % SOLN Apply to eye.     tamsulosin  (FLOMAX ) 0.4 MG CAPS capsule Take 1 capsule (0.4 mg total) by mouth daily. 30 capsule 5   Vitamin D, Ergocalciferol, (DRISDOL) 1.25 MG (50000 UNIT) CAPS capsule Take 50,000 Units by mouth every 14 (fourteen) days.     Vitamins A & D (A+D PREVENT) OINT ointment APPLY TOPICALLY AS NEEDED FOR DRY SKIN 113 g 11   No current facility-administered medications for this visit.     PHYSICAL EXAMINATION: ECOG PERFORMANCE STATUS: 1 - Symptomatic but completely ambulatory Vitals:   12/09/23 1022  BP: 127/80  Pulse: 67  Resp: 17  Temp: (!) 97.5 F (36.4 C)  SpO2: 100%   Filed Weights   12/09/23 1022  Weight: 196 lb (88.9 kg)    Physical Exam Constitutional:      General: He is not in acute distress. HENT:     Head: Normocephalic and atraumatic.  Eyes:     General: No scleral icterus. Cardiovascular:     Rate and Rhythm: Normal rate and regular rhythm.  Pulmonary:     Effort: Pulmonary effort is normal. No respiratory distress.  Breath sounds: No wheezing.  Abdominal:     General: There is no distension.     Palpations: Abdomen is  soft.  Musculoskeletal:        General: No deformity. Normal range of motion.     Cervical back: Normal range of motion and neck supple.  Skin:    Findings: No erythema or rash.  Neurological:     Mental Status: He is alert. Mental status is at baseline.  Psychiatric:     Comments: Flat affect      LABORATORY DATA:  I have reviewed the data as listed Lab Results  Component Value Date   WBC 5.8 12/09/2023   HGB 13.9 12/09/2023   HCT 38.5 (L) 12/09/2023   MCV 92.3 12/09/2023   PLT 129 (L) 12/09/2023   Recent Labs    01/08/23 0935 06/09/23 0916 12/09/23 0946  NA 135 132* 135  K 3.9 4.0 4.0  CL 102 99 100  CO2 26 24 27   GLUCOSE 101* 122* 90  BUN 10 13 14   CREATININE 0.64 0.67 0.68  CALCIUM  8.6* 8.6* 8.8*  GFRNONAA >60 >60 >60  PROT 5.8* 6.4* 6.6  ALBUMIN 3.6 3.9 4.1  AST 21 21 21   ALT 20 18 18   ALKPHOS 66 75 71  BILITOT 0.7 0.7 0.7    RADIOGRAPHIC STUDIES: I have personally reviewed the radiological images as listed and agreed with the findings in the report.  No results found.

## 2023-12-22 ENCOUNTER — Other Ambulatory Visit: Payer: Self-pay | Admitting: Oncology

## 2023-12-23 ENCOUNTER — Ambulatory Visit: Admission: RE | Admit: 2023-12-23 | Source: Ambulatory Visit

## 2024-01-04 ENCOUNTER — Ambulatory Visit (INDEPENDENT_AMBULATORY_CARE_PROVIDER_SITE_OTHER): Payer: Medicare Other | Admitting: Podiatry

## 2024-01-04 DIAGNOSIS — Z91198 Patient's noncompliance with other medical treatment and regimen for other reason: Secondary | ICD-10-CM

## 2024-01-05 NOTE — Progress Notes (Signed)
 1. Failure to attend appointment with reason given    Appointment canceled by patient.

## 2024-01-12 ENCOUNTER — Ambulatory Visit: Attending: Oncology

## 2024-01-15 ENCOUNTER — Ambulatory Visit (INDEPENDENT_AMBULATORY_CARE_PROVIDER_SITE_OTHER): Admitting: Podiatry

## 2024-01-15 DIAGNOSIS — E119 Type 2 diabetes mellitus without complications: Secondary | ICD-10-CM

## 2024-01-15 DIAGNOSIS — B351 Tinea unguium: Secondary | ICD-10-CM | POA: Diagnosis not present

## 2024-01-15 DIAGNOSIS — L84 Corns and callosities: Secondary | ICD-10-CM | POA: Diagnosis not present

## 2024-01-15 DIAGNOSIS — M79675 Pain in left toe(s): Secondary | ICD-10-CM

## 2024-01-15 DIAGNOSIS — M79674 Pain in right toe(s): Secondary | ICD-10-CM

## 2024-01-15 DIAGNOSIS — M25461 Effusion, right knee: Secondary | ICD-10-CM | POA: Diagnosis not present

## 2024-01-15 DIAGNOSIS — M1731 Unilateral post-traumatic osteoarthritis, right knee: Secondary | ICD-10-CM | POA: Diagnosis not present

## 2024-01-21 ENCOUNTER — Encounter: Payer: Self-pay | Admitting: Podiatry

## 2024-01-21 NOTE — Progress Notes (Signed)
  Subjective:  Patient ID: Wayne Jones, male    DOB: [birth year],  MRN: [id number]  Wayne Jones presents to clinic today for preventative diabetic foot care and callus(es) b/l feet and painful mycotic toenails that are difficult to trim. Painful toenails interfere with ambulation. Aggravating factors include wearing enclosed shoe gear. Pain is relieved with periodic professional debridement. Painful calluses are aggravated when weightbearing with and without shoegear. Pain is relieved with periodic professional debridement.   New problem(s): None.   PCP is Arleen Lacer, MD. Holli Lunger 10/07/2023.  No Known Allergies  Review of Systems: Negative except as noted in the HPI.  Objective: No changes noted in today's physical examination. There were no vitals filed for this visit. Wayne Jones is a pleasant 63 y.o. male WD, WN in NAD. AAO x 3.  Vascular Examination: Capillary refill time immediate b/l. Palpable pedal pulses. Pedal hair present b/l. No pain with calf compression b/l. Skin temperature gradient WNL b/l. No cyanosis or clubbing b/l. No ischemia or gangrene noted b/l. No edema noted b/l LE.  Neurological Examination: Sensation grossly intact b/l with 10 gram monofilament. Vibratory sensation intact b/l.   Dermatological Examination: Pedal skin with normal turgor, texture and tone b/l.  No open wounds. No interdigital macerations.   Toenails 1-5 b/l thick, discolored, elongated with subungual debris and pain on dorsal palpation.   Hyperkeratotic lesion(s) submet head 1 b/l.  No erythema, no edema, no drainage, no fluctuance.  Musculoskeletal Examination: Muscle strength 5/5 to all lower extremity muscle groups bilaterally. Hammertoe deformity noted 2-5 b/l.  Radiographs: None  Assessment/Plan: 1. Pain due to onychomycosis of toenails of both feet   2. Callus   3. Diabetes mellitus without complication [facility])   Consent given for treatment. Patient examined.All patient's  and/or POA's questions/concerns addressed on today's visit. Toenails 1-5 debrided in length and girth without incident. Callus(es) submet head 1 b/l pared with sharp debridement without incident. Continue foot and shoe inspections daily. Monitor blood glucose per PCP/Endocrinologist's recommendations.Continue soft, supportive shoe gear daily. Report any pedal injuries to medical professional. Call office if there are any questions/concerns.  Return in about 3 months (around 04/16/2024).  Luella Sager, DPM      [facility] LOCATION: [address] [place] [address]                   Office [phone]   [facility] LOCATION: [address] [place], [place] [address] Office [phone]

## 2024-02-09 ENCOUNTER — Other Ambulatory Visit: Payer: Self-pay | Admitting: Family Medicine

## 2024-02-09 DIAGNOSIS — E538 Deficiency of other specified B group vitamins: Secondary | ICD-10-CM

## 2024-02-09 NOTE — Telephone Encounter (Signed)
 Copied from CRM [id number]. Topic: Clinical - Medication Refill >> Feb 09, 2024 11:50 AM Myrick T wrote: Medication: Cyanocobalamin  (VITAMIN B-12) 500 MCG SUBL  Has the patient contacted their pharmacy? No  This is the patient's preferred pharmacy:  [facility] - [place], [place] - [address] [place] [address] Phone: [phone] Fax: [phone]  Is this the correct pharmacy for this prescription? Yes If no, delete pharmacy and type the correct one.   Has the prescription been filled recently? No  Is the patient out of the medication? Yes  Has the patient been seen for an appointment in the last year OR does the patient have an upcoming appointment? Yes  Can we respond through MyChart? No  Agent: Please be advised that Rx refills may take up to 3 business days. We ask that you follow-up with your pharmacy.

## 2024-02-11 NOTE — Telephone Encounter (Signed)
 Requested medication (s) are due for refill today: see notes to clinic  Requested medication (s) are on the active medication list: higher dose on med list for 1000 mcg  Last refill:  cyanocobalamin  1000 mcg SL- 03/26/21 #45 ml 5 refills  Future visit scheduled: yes in 1 month  Notes to clinic:  patient requesting 500 mcg subl, instead of 1000 . Do you want to refill what is on medication list?     Requested Prescriptions  Pending Prescriptions Disp Refills   Cyanocobalamin  1000 MCG/15ML LIQD 450 mL 5    Sig: Take 5 mLs (333.3333 mcg total) by mouth daily.     Endocrinology:  Vitamins - Vitamin B12 Failed - 02/11/2024  2:08 PM      Failed - HCT in normal range and within 360 days    HCT  Date Value Ref Range Status  12/09/2023 38.5 (L) 39.0 - 52.0 % Final  04/10/2013 40.4 40.0 - 52.0 % Final         Failed - B12 Level in normal range and within 360 days    Vitamin B-12  Date Value Ref Range Status  04/08/2022 646 200 - 1,100 pg/mL Final         Passed - HGB in normal range and within 360 days    Hemoglobin  Date Value Ref Range Status  12/09/2023 13.9 13.0 - 17.0 g/dL Final   HGB  Date Value Ref Range Status  04/10/2013 14.4 13.0 - 18.0 g/dL Final         Passed - Valid encounter within last 12 months    Recent Outpatient Visits           4 months ago Bipolar I disorder, single manic episode, moderate (HCC)   [place] [facility] Sowles, Krichna, MD       Future Appointments             In 1 month Sowles, Krichna, MD [facility], [facility]

## 2024-02-12 MED ORDER — CYANOCOBALAMIN 1000 MCG/15ML PO LIQD: 5.0000 mL | Freq: Every day | ORAL | 5 refills | Status: AC

## 2024-02-23 ENCOUNTER — Other Ambulatory Visit: Payer: Self-pay | Admitting: Family Medicine

## 2024-02-23 DIAGNOSIS — J309 Allergic rhinitis, unspecified: Secondary | ICD-10-CM

## 2024-03-02 ENCOUNTER — Other Ambulatory Visit: Payer: Self-pay | Admitting: Family Medicine

## 2024-03-02 DIAGNOSIS — J309 Allergic rhinitis, unspecified: Secondary | ICD-10-CM

## 2024-03-08 DIAGNOSIS — F3489 Other specified persistent mood disorders: Secondary | ICD-10-CM | POA: Diagnosis not present

## 2024-03-23 ENCOUNTER — Other Ambulatory Visit: Payer: Self-pay | Admitting: Internal Medicine

## 2024-03-23 DIAGNOSIS — J309 Allergic rhinitis, unspecified: Secondary | ICD-10-CM

## 2024-03-24 NOTE — Telephone Encounter (Signed)
 Requested medication (s) are due for refill today - expired Rx  Requested medication (s) are on the active medication list -yes  Future visit scheduled -yes  Last refill: 09/04/21 16g 6RF  Notes to clinic: expired Rx- sent for provider review   Requested Prescriptions  Pending Prescriptions Disp Refills   fluticasone  (FLONASE ) 50 MCG/ACT nasal spray [Pharmacy Med Name: Fluticasone  Propionate 50 MCG/ACT Suspension] 16 g 10    Sig: PLACE 2 SPRAYS INTO BOTH NOSTRILS ONCE DAILY  *SHAKE GENTLY*     Ear, Nose, and Throat: Nasal Preparations - Corticosteroids Passed - 03/24/2024  2:48 PM      Passed - Valid encounter within last 12 months    Recent Outpatient Visits           5 months ago Bipolar I disorder, single manic episode, moderate (HCC)   [place] [facility] Sowles, Krichna, MD       Future Appointments             In 2 weeks Sowles, Krichna, MD [facility], [facility]               Requested Prescriptions  Pending Prescriptions Disp Refills   fluticasone  (FLONASE ) 50 MCG/ACT nasal spray [Pharmacy Med Name: Fluticasone  Propionate 50 MCG/ACT Suspension] 16 g 10    Sig: PLACE 2 SPRAYS INTO BOTH NOSTRILS ONCE DAILY  *SHAKE GENTLY*     Ear, Nose, and Throat: Nasal Preparations - Corticosteroids Passed - 03/24/2024  2:48 PM      Passed - Valid encounter within last 12 months    Recent Outpatient Visits           5 months ago Bipolar I disorder, single manic episode, moderate (HCC)   [facility] Glenard Mire, MD       Future Appointments             In 2 weeks Sowles, Krichna, MD [facility], [facility]

## 2024-03-29 ENCOUNTER — Other Ambulatory Visit: Payer: Self-pay | Admitting: Family Medicine

## 2024-03-29 DIAGNOSIS — E785 Hyperlipidemia, unspecified: Secondary | ICD-10-CM

## 2024-03-29 DIAGNOSIS — I10 Essential (primary) hypertension: Secondary | ICD-10-CM

## 2024-03-29 DIAGNOSIS — K219 Gastro-esophageal reflux disease without esophagitis: Secondary | ICD-10-CM

## 2024-03-29 DIAGNOSIS — N138 Other obstructive and reflux uropathy: Secondary | ICD-10-CM

## 2024-04-05 ENCOUNTER — Telehealth: Payer: Self-pay

## 2024-04-05 NOTE — Telephone Encounter (Signed)
 Spoke toLisa advised we can do both at only 1 appt. Olam preferred to stick with 8:20am.

## 2024-04-05 NOTE — Telephone Encounter (Signed)
 Copied from CRM [id number]. Topic: Appointments - Scheduling Inquiry for Clinic >> Apr 05, 2024  8:38 AM Mesmerise C wrote: Reason for CRM: Olam from [facility] [employer] service checking for appointments  has 2 appointments of 9/5 at 2 different times one is for fl2 paperwork inquiring if can be done at appointment in the morning can be reached at [phone]

## 2024-04-08 ENCOUNTER — Encounter: Payer: Self-pay | Admitting: Family Medicine

## 2024-04-08 ENCOUNTER — Ambulatory Visit: Admitting: Family Medicine

## 2024-04-08 ENCOUNTER — Ambulatory Visit (INDEPENDENT_AMBULATORY_CARE_PROVIDER_SITE_OTHER): Admitting: Family Medicine

## 2024-04-08 VITALS — BP 126/74 | HR 77 | Temp 98.6°F | Resp 16 | Ht 72.0 in | Wt 202.0 lb

## 2024-04-08 DIAGNOSIS — F3012 Manic episode without psychotic symptoms, moderate: Secondary | ICD-10-CM | POA: Diagnosis not present

## 2024-04-08 DIAGNOSIS — F79 Unspecified intellectual disabilities: Secondary | ICD-10-CM

## 2024-04-08 DIAGNOSIS — E538 Deficiency of other specified B group vitamins: Secondary | ICD-10-CM

## 2024-04-08 DIAGNOSIS — K219 Gastro-esophageal reflux disease without esophagitis: Secondary | ICD-10-CM | POA: Diagnosis not present

## 2024-04-08 DIAGNOSIS — Z8639 Personal history of other endocrine, nutritional and metabolic disease: Secondary | ICD-10-CM | POA: Diagnosis not present

## 2024-04-08 DIAGNOSIS — M170 Bilateral primary osteoarthritis of knee: Secondary | ICD-10-CM

## 2024-04-08 DIAGNOSIS — D696 Thrombocytopenia, unspecified: Secondary | ICD-10-CM | POA: Diagnosis not present

## 2024-04-08 DIAGNOSIS — N138 Other obstructive and reflux uropathy: Secondary | ICD-10-CM

## 2024-04-08 DIAGNOSIS — I10 Essential (primary) hypertension: Secondary | ICD-10-CM

## 2024-04-08 DIAGNOSIS — E785 Hyperlipidemia, unspecified: Secondary | ICD-10-CM

## 2024-04-08 DIAGNOSIS — N401 Enlarged prostate with lower urinary tract symptoms: Secondary | ICD-10-CM

## 2024-04-08 NOTE — Progress Notes (Signed)
 Name: Wayne Jones   MRN: [id number]    DOB: 10/04/1960   Date:04/08/2024       Progress Note  Subjective  Chief Complaint  Chief Complaint  Patient presents with   Medical Management of Chronic Issues    6 month follow-up   FL2   HPI   He came in with caregiver Narvis today   History of DM: his A1C has been normal without mediations for years. He is on statin therapy .His weight is still trending down but at slower pace . He follows a low carbohydrate diet at group ome    HTN: he is only on low dose ACE and tolerating medication well. BP has been controlled    BPH: he is now seeing Dr. Nivia. He  still masturbates often and causes penile irritation but doing better since he has been using lotion and also topical nystatin  prn  He is on Proscar  and Flomax , last PSA was normal 08/2022 , he missed his last appointment because he refused to go., we will recheck labs today. We tried to send him to another urologist but he refused to go also  Dyslipidemia: taking statin and Lovaza   ,we will recheck labs today    Bipolar disorder: seeing psychiatrist - Dr Dozier he takes Abilify , Duloxetine and gabapentin, clonazepam prn . He is compliant due to living in a group home . He continues to masturbate frequently, sometimes in front of other patients but he is able to be re-directed.    Malnutrition:  Caregiver states he seems to have an normal appetite. His weight  was in the 260 lbs back in 2019, 250 lbs in 2020's he was down  to 231 lbs end of 2022 , down to 206 in 2023 it went up to 210 early 2024 and is has been 202-203 lbs in the past 6 months. . He had EGD and colonoscopy that were unremarkable. He saw Dr. Babara and negative work up , he was supposed to have an abdominal US  in May but he refused to go, but it was re-scheduled for Nov.    Thrombocytopenia/Splenomegaly: seeing Dr. Babara. Per her notes secondary to Abilify and carbamazepine . He is on lower dose of Abilify now , last platelets 129    OA knee: he was given knee braces by ortho but he does not seem to be wearing a brace consistently, we added meloxicam  but he continues to have discomfort and antalgic pain . Unchanged    Thyroid  nodule: seen by Dr. Damian, negative biopsy and normal TSH     Patient Active Problem List   Diagnosis Date Noted   Thyroid  mass 12/05/2022   Splenomegaly 10/28/2022   Primary osteoarthritis of both knees 01/16/2022   B12 deficiency 01/16/2022   Moderate protein-calorie malnutrition (HCC) 01/16/2022   Bilateral groin pain 12/10/2021   Weight loss, unintentional    Abnormal CT scan, stomach    Polyp of colon    Osteoarthritis of knee 03/26/2020   Varicose veins, lower extremity, with inflammation, ulcerated, unspecified laterality (HCC) 05/26/2017   Bilateral lower extremity edema 04/14/2017   Thrombocytopenia (HCC) 07/20/2016   Intellectual disability 03/17/2016   Polypharmacy 01/10/2016   Benign prostatic hyperplasia without urinary obstruction 01/09/2015   Chronic kidney disease (CKD), stage I 01/09/2015   Cognitive decline 01/09/2015   History of diabetes mellitus 01/09/2015   GERD without esophagitis 01/09/2015   Hearing loss 01/09/2015   Dyslipidemia 01/09/2015   Morbid obesity, unspecified obesity type (HCC) 01/09/2015  Obstructive sleep apnea 01/09/2015   Other specified causes of urethral stricture 01/09/2015   Hernia of anterior abdominal wall 01/09/2015   BPH with obstruction/lower urinary tract symptoms 07/09/2012   Incomplete bladder emptying 07/09/2012   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   Benign essential HTN 02/04/2007   Bipolar I disorder, single manic episode, moderate (HCC) 02/04/2007    Past Surgical History:  Procedure Laterality Date   COLONOSCOPY WITH PROPOFOL  N/A 11/19/2021   Procedure: COLONOSCOPY WITH PROPOFOL ;  Surgeon: Unk Corinn Skiff, MD;  Location: [facility] ENDOSCOPY;  Service: Gastroenterology;  Laterality: N/A;   ESOPHAGOGASTRODUODENOSCOPY (EGD) WITH  PROPOFOL  N/A 11/19/2021   Procedure: ESOPHAGOGASTRODUODENOSCOPY (EGD) WITH PROPOFOL ;  Surgeon: Unk Corinn Skiff, MD;  Location: [facility] ENDOSCOPY;  Service: Gastroenterology;  Laterality: N/A;   LAPAROSCOPIC CHOLECYSTECTOMY     LAPAROSCOPY  12/03/2010   VENTRAL HERNIA REPAIR      Family History  Family history unknown: Yes    Social History   Tobacco Use   Smoking status: Never   Smokeless tobacco: Never  Substance Use Topics   Alcohol use: No    Alcohol/week: 0.0 standard drinks of alcohol     Current Outpatient Medications:    acetaminophen (TYLENOL) 500 MG tablet, Take 500 mg by mouth 3 (three) times daily., Disp: , Rfl:    antiseptic oral rinse (BIOTENE) LIQD, 15 mLs by Mouth Rinse route as needed for dry mouth., Disp: , Rfl:    aripiprazole (ABILIFY) 10 MG disintegrating tablet, Take 10 mg by mouth daily., Disp: , Rfl:    atorvastatin  (LIPITOR) 10 MG tablet, Take 1 tablet (10 mg total) by mouth daily., Disp: 30 tablet, Rfl: 5   carbamazepine  (EQUETRO ) 200 MG CP12 12 hr capsule, Take 200 mg by mouth 2 (two) times daily., Disp: , Rfl:    carbamide peroxide (DEBROX) 6.5 % OTIC solution, PLACE 5 DROPS INTO BOTH EARS TWO TIMES A DAY, Disp: 15 mL, Rfl: 0   clonazePAM (KLONOPIN) 1 MG tablet, Take 1 mg by mouth daily., Disp: , Rfl:    Cyanocobalamin  1000 MCG/15ML LIQD, Take 5 mLs (333.3333 mcg total) by mouth daily., Disp: 450 mL, Rfl: 5   DULoxetine (CYMBALTA) 60 MG capsule, Take 60 mg by mouth daily., Disp: , Rfl:    Elastic Bandages & Supports (MEDICAL COMPRESSION SOCKS) MISC, 2 each by Does not apply route daily. Apply in am's and remove it at bedtime, Disp: 2 each, Rfl: 1   famotidine  (PEPCID ) 40 MG tablet, Take 1 tablet (40 mg total) by mouth daily., Disp: 30 tablet, Rfl: 5   feeding supplement, ENSURE COMPLETE, (ENSURE COMPLETE) LIQD, Take 237 mLs by mouth 2 (two) times daily between meals., Disp: 237 mL, Rfl: 3   fexofenadine  (ALLEGRA ) 60 MG tablet, TAKE 1 TABLET BY MOUTH TWICE  DAILY, Disp: 60 tablet, Rfl: 2   finasteride  (PROSCAR ) 5 MG tablet, Take 1 tablet (5 mg total) by mouth daily., Disp: 30 tablet, Rfl: 5   fluticasone  (FLONASE ) 50 MCG/ACT nasal spray, PLACE 2 SPRAYS INTO BOTH NOSTRILS ONCE DAILY  *SHAKE GENTLY*, Disp: 16 g, Rfl: 1   gabapentin (NEURONTIN) 300 MG capsule, Take 300 mg by mouth in the morning., Disp: , Rfl:    gabapentin (NEURONTIN) 600 MG tablet, Take 600 mg by mouth at bedtime., Disp: , Rfl:    hydrOXYzine (ATARAX) 10 MG tablet, Take 10 mg by mouth 3 (three) times daily., Disp: , Rfl:    lisinopril  (ZESTRIL ) 5 MG tablet, Take 1 tablet (5 mg total) by mouth daily., Disp:  30 tablet, Rfl: 5   Lubricants (K-Y JELLY) GEL, USE  AS NEEDED TO BE ABLE TO MASTURBATE, Disp: 57 g, Rfl: 10   meloxicam  (MOBIC ) 15 MG tablet, Take 1 tablet (15 mg total) by mouth daily as needed for pain. Please don't give it daily, Disp: 30 tablet, Rfl: 1   nystatin  cream (MYCOSTATIN ), Apply topically 2 (two) times daily., Disp: 30 g, Rfl: 5   omega-3 acid ethyl esters (LOVAZA ) 1 g capsule, TAKE 1 CAPSULE BY MOUTH TWICE DAILY  *DO NOT CRUSH OR CHEW*, Disp: 2 capsule, Rfl: 10   omeprazole  (PRILOSEC) 20 MG capsule, TAKE 1 CAPSULE BY MOUTH ONCE DAILY *DO NOT CRUSH OR CHEW*, Disp: 30 capsule, Rfl: 5   Polyethyl Glycol-Propyl Glycol (SYSTANE) 0.4-0.3 % SOLN, Apply to eye., Disp: , Rfl:    tamsulosin  (FLOMAX ) 0.4 MG CAPS capsule, Take 1 capsule (0.4 mg total) by mouth daily., Disp: 30 capsule, Rfl: 5   Vitamin D, Ergocalciferol, (DRISDOL) 1.25 MG (50000 UNIT) CAPS capsule, Take 50,000 Units by mouth every 14 (fourteen) days., Disp: , Rfl:    Vitamins A & D (A+D PREVENT) OINT ointment, APPLY TOPICALLY AS NEEDED FOR DRY SKIN, Disp: 113 g, Rfl: 11  No Known Allergies  I personally reviewed active problem list, medication list, allergies, family history with the patient/caregiver today.   ROS  Ten systems reviewed and is negative except as mentioned in HPI    Objective Physical  Exam Constitutional: Patient appears well-developed and well-nourished. No distress.  HEENT: head atraumatic, normocephalic, pupils equal and reactive to light,neck supple Cardiovascular: Normal rate, regular rhythm and normal heart sounds.  No murmur heard. No BLE edema. Pulmonary/Chest: Effort normal and breath sounds normal. No respiratory distress. Abdominal: Soft.  There is no tenderness. Psychiatric: Cooperative, quiet, rocking on exam table   Vitals:   04/08/24 0838  BP: 126/74  Pulse: 77  Resp: 16  Temp: 98.6 F (37 C)  SpO2: 99%  Weight: 202 lb (91.6 kg)  Height: 6' (1.829 m)    Body mass index is 27.4 kg/m.    PHQ2/9:    10/07/2023    9:51 AM 07/16/2023   11:21 AM 04/09/2023    8:36 AM 08/11/2022    8:31 AM 07/10/2022   11:05 AM  Depression screen PHQ 2/9  Decreased Interest 0 0 0 0 0  Down, Depressed, Hopeless 0 0 0 0 0  PHQ - 2 Score 0 0 0 0 0  Altered sleeping 0 0 0 0 0  Tired, decreased energy 0 0 0 0 0  Change in appetite 0 0 0 0 0  Feeling bad or failure about yourself  0 0 0 0 0  Trouble concentrating 0 0 0 0 0  Moving slowly or fidgety/restless 0 0 0 0 0  Suicidal thoughts 0 0 0 0 0  PHQ-9 Score 0 0 0 0 0  Difficult doing work/chores Not difficult at all Not difficult at all       phq 9 is negative  Fall Risk:    07/16/2023   11:24 AM 04/09/2023    8:36 AM 08/11/2022    8:31 AM 07/10/2022   11:08 AM 04/08/2022    1:47 PM  Fall Risk   Falls in the past year? 1 1 0 0 0  Number falls in past yr: 1 0 0    Injury with Fall? 1 1 0    Risk for fall due to : History of fall(s);Other (Comment) Impaired balance/gait No Fall  Risks No Fall Risks No Fall Risks  Risk for fall due to: Comment TERRIBLE KNEE PAIN      Follow up Falls evaluation completed;Falls prevention discussed Falls prevention discussed Falls prevention discussed  Education provided;Falls prevention discussed  Falls prevention discussed;Education provided;Falls evaluation completed      Data  saved with a previous flowsheet row definition      Assessment & Plan  1. Bipolar I disorder, single manic episode, moderate (HCC) (Primary)  Under the care of Dr. Dozier   2. Thrombocytopenia (HCC)  Keep follow up with Dr. Babara  3. Intellectual disability  Lives in a group home   4. Benign essential HTN  At goal  5. GERD without esophagitis  Under control   6. BPH with obstruction/lower urinary tract symptoms  - PSA - needs to see urologist - caregiver aware.   7. Primary osteoarthritis of both knees  Unchanged   8. B12 deficiency  - B12 and Folate Panel  9. Dyslipidemia  - Lipid panel  10. History of diabetes mellitus  - Hemoglobin A1c

## 2024-04-11 ENCOUNTER — Encounter: Payer: Self-pay | Admitting: Emergency Medicine

## 2024-04-11 ENCOUNTER — Other Ambulatory Visit: Payer: Self-pay

## 2024-04-11 ENCOUNTER — Emergency Department
Admission: EM | Admit: 2024-04-11 | Discharge: 2024-04-12 | Disposition: A | Discharge status: Home / Self Care | Attending: Emergency Medicine | Admitting: Emergency Medicine

## 2024-04-11 ENCOUNTER — Emergency Department

## 2024-04-11 DIAGNOSIS — S0101XA Laceration without foreign body of scalp, initial encounter: Secondary | ICD-10-CM | POA: Diagnosis not present

## 2024-04-11 DIAGNOSIS — S0990XA Unspecified injury of head, initial encounter: Secondary | ICD-10-CM

## 2024-04-11 DIAGNOSIS — E119 Type 2 diabetes mellitus without complications: Secondary | ICD-10-CM | POA: Diagnosis not present

## 2024-04-11 DIAGNOSIS — W01198A Fall on same level from slipping, tripping and stumbling with subsequent striking against other object, initial encounter: Secondary | ICD-10-CM | POA: Insufficient documentation

## 2024-04-11 DIAGNOSIS — R410 Disorientation, unspecified: Secondary | ICD-10-CM | POA: Diagnosis not present

## 2024-04-11 DIAGNOSIS — S0191XA Laceration without foreign body of unspecified part of head, initial encounter: Secondary | ICD-10-CM | POA: Diagnosis not present

## 2024-04-11 DIAGNOSIS — E049 Nontoxic goiter, unspecified: Secondary | ICD-10-CM | POA: Diagnosis not present

## 2024-04-11 DIAGNOSIS — I10 Essential (primary) hypertension: Secondary | ICD-10-CM | POA: Insufficient documentation

## 2024-04-11 DIAGNOSIS — W19XXXA Unspecified fall, initial encounter: Secondary | ICD-10-CM | POA: Diagnosis not present

## 2024-04-11 DIAGNOSIS — R519 Headache, unspecified: Secondary | ICD-10-CM | POA: Diagnosis not present

## 2024-04-11 LAB — CBC
HCT: 36.9 % — ABNORMAL LOW (ref 39.0–52.0)
Hemoglobin: 13.5 g/dL (ref 13.0–17.0)
MCH: 33.3 pg (ref 26.0–34.0)
MCHC: 36.6 g/dL — ABNORMAL HIGH (ref 30.0–36.0)
MCV: 91.1 fL (ref 80.0–100.0)
Platelets: 130 K/uL — ABNORMAL LOW (ref 150–400)
RBC: 4.05 MIL/uL — ABNORMAL LOW (ref 4.22–5.81)
RDW: 12.1 % (ref 11.5–15.5)
WBC: 5.4 K/uL (ref 4.0–10.5)
nRBC: 0 % (ref 0.0–0.2)

## 2024-04-11 LAB — COMPREHENSIVE METABOLIC PANEL WITH GFR
ALT: 17 U/L (ref 0–44)
AST: 23 U/L (ref 15–41)
Albumin: 3.5 g/dL (ref 3.5–5.0)
Alkaline Phosphatase: 64 U/L (ref 38–126)
Anion gap: 12 (ref 5–15)
BUN: 14 mg/dL (ref 8–23)
CO2: 24 mmol/L (ref 22–32)
Calcium: 8.7 mg/dL — ABNORMAL LOW (ref 8.9–10.3)
Chloride: 97 mmol/L — ABNORMAL LOW (ref 98–111)
Creatinine, Ser: 0.51 mg/dL — ABNORMAL LOW (ref 0.61–1.24)
GFR, Estimated: >60 mL/min (ref >60)
Glucose, Bld: 110 mg/dL — ABNORMAL HIGH (ref 70–99)
Potassium: 3.8 mmol/L (ref 3.5–5.1)
Sodium: 133 mmol/L — ABNORMAL LOW (ref 135–145)
Total Bilirubin: 0.7 mg/dL (ref 0.0–1.2)
Total Protein: 6.2 g/dL — ABNORMAL LOW (ref 6.5–8.1)

## 2024-04-11 LAB — URINALYSIS, ROUTINE W REFLEX MICROSCOPIC
Bilirubin Urine: NEGATIVE
Glucose, UA: NEGATIVE mg/dL
Hgb urine dipstick: NEGATIVE
Ketones, ur: NEGATIVE mg/dL
Leukocytes,Ua: NEGATIVE
Nitrite: NEGATIVE
Protein, ur: NEGATIVE mg/dL
Specific Gravity, Urine: 1.01 (ref 1.005–1.030)
pH: 7 (ref 5.0–8.0)

## 2024-04-11 MED ORDER — LIDOCAINE-EPINEPHRINE 2 %-1:100000 IJ SOLN
20.0000 mL | Freq: Once | INTRAMUSCULAR | Status: AC
  Administered 2024-04-11: 20 mL
  Filled 2024-04-11: qty 1

## 2024-04-11 NOTE — Discharge Instructions (Addendum)
 8 staples were placed.  Follow-up with your doctor in about 10 days for these to be removed.

## 2024-04-11 NOTE — ED Provider Notes (Signed)
 [facility] Provider Note    Event Date/Time   First MD Initiated Contact with Patient 04/11/24 2210     (approximate)   History   Fall  I called patient's guardian, Carliss Shams, left HIPAA compliant voicemail requesting callback.  Not able to reach his guardian as of 1015  HPI  Wayne Jones is a 63 y.o. male history of cognitive disability, diabetes hypertension hyperlipidemia  Patient not able to give history.  His only symptom at this point is he tells me that his pee pee pointing towards his penis hurts.  He denies any headache no pain anywhere else.  Care home worker arrived, knows the patient well.  Advises that they need he has some difficulty with walking due to chronic knee problems and he fell tonight.  Cut on the back of his head was the concern.  He is otherwise been healthy acting normally and that complaining of discomfort in his penis is normal, that is a typical complaint for him [I was also able to find documentation from his primary care physician that this is also frequent and normal concern]     Physical Exam   Triage Vital Signs: ED Triage Vitals [04/11/24 2200]  Encounter Vitals Group     BP (!) 163/85     Girls Systolic BP Percentile      Girls Diastolic BP Percentile      Boys Systolic BP Percentile      Boys Diastolic BP Percentile      Pulse Rate 69     Resp 18     Temp 97.6 F (36.4 C)     Temp Source Oral     SpO2 100 %     Weight      Height      Head Circumference      Peak Flow      Pain Score      Pain Loc      Pain Education      Exclude from Growth Chart     Most recent vital signs: Vitals:   04/11/24 2200  BP: (!) 163/85  Pulse: 69  Resp: 18  Temp: 97.6 F (36.4 C)  SpO2: 100%     General: Awake, no distress.  Stellate laceration approximately 1-1/2 x 3 cm in size over the posterior occiput. CV:  Good peripheral perfusion.  Normal tones and rate Resp:  Normal effort.  Clear  bilateral Abd:  No distention.  Soft nontender.  Normal circumcised penis.  No notable drainage tenderness or scrotal swelling.  He reports that tip of the penis hurts.  Appears normal on exam Other:  Moves extremities no noted deficits.  Range of motion evaluated without pain and no deformities.  No lacerations or abrasions on extremities.  Normocephalic atraumatic with exception to stellate laceration   ED Results / Procedures / Treatments   Labs (all labs ordered are listed, but only abnormal results are displayed) Labs Reviewed  URINALYSIS, ROUTINE W REFLEX MICROSCOPIC - Abnormal; Notable for the following components:      Result Value   Color, Urine YELLOW (*)    APPearance HAZY (*)    All other components within normal limits  CBC - Abnormal; Notable for the following components:   RBC 4.05 (*)    HCT 36.9 (*)    MCHC 36.6 (*)    Platelets 130 (*)    All other components within normal limits  COMPREHENSIVE METABOLIC PANEL WITH GFR - Abnormal;  Notable for the following components:   Sodium 133 (*)    Chloride 97 (*)    Glucose, Bld 110 (*)    Creatinine, Ser 0.51 (*)    Calcium  8.7 (*)    Total Protein 6.2 (*)    All other components within normal limits     EKG     RADIOLOGY   CT head cervical spine pending Dr. Willo to follow     PROCEDURES:  Critical Care performed: No  .Laceration Repair  Date/Time: 04/11/2024 11:36 PM  Performed by: Dicky Anes, MD Authorized by: Dicky Anes, MD   Consent:    Consent obtained:  Emergent situation (Attempted to contact guardian, unable to reach.  Did discuss with care home worker who is at the bedside, discussed treatment follow-up, managing, monitoring for signs of infection and need for staple removal in about 10 days)   Consent given by: Implied consent given laceration, attempt to reach guardian but no return call back as of 11 PM. Universal protocol:    Procedure explained and questions answered to patient or  proxy's satisfaction: yes     Test results available: yes     Imaging studies available: yes     Patient identity confirmed:  Arm band Anesthesia:    Anesthesia method:  Local infiltration   Local anesthetic:  Lidocaine  2% WITH epi (6 ml) Laceration details:    Location:  Scalp   Scalp location:  Crown   Length (cm):  3   Depth (mm):  0.5 (Examined carefully no foreign bodies.  Kerney appears intact.  No bleeding active) Pre-procedure details:    Preparation:  Patient was prepped and draped in usual sterile fashion Exploration:    Limited defect created (wound extended): no     Imaging outcome: foreign body not noted     Wound exploration: entire depth of wound visualized     Wound extent: areolar tissue violated     Wound extent: fascia not violated, no foreign body, no tendon damage and no underlying fracture     Contaminated: no   Treatment:    Area cleansed with:  Saline   Amount of cleaning:  Standard   Irrigation solution:  Sterile water   Irrigation volume:  200 ml,   Irrigation method:  Syringe   Visualized foreign bodies/material removed: no     Debridement:  None Skin repair:    Repair method:  Staples   Number of staples:  8 Approximation:    Approximation:  Close Repair type:    Repair type:  Simple Post-procedure details:    Procedure completion:  Tolerated well, no immediate complications    MEDICATIONS ORDERED IN ED: Medications  lidocaine -EPINEPHrine  (XYLOCAINE  W/EPI) 2 %-1:100000 (with pres) injection 20 mL (has no administration in time range)     IMPRESSION / MDM / ASSESSMENT AND PLAN / ED COURSE  I reviewed the triage vital signs and the nursing notes.                              Differential diagnosis includes, but is not limited to, possible fall, other considerations would be preceding illness but the patient does not have any history to suggest this.  Once caretaker arrived at the bedside reports he has been in his normal health no concerns  I suspect he fall does have some mobility issues at baseline due to his knees.  This is chronic.  He is acting and  behaving normally per his care home worker.  Attempted to reach the guardian, left voicemail.  Care home worker advises that his particular guardian usually will not call back until the morning if contacted later in the day  Patient's presentation is most consistent with acute complicated illness / injury requiring diagnostic workup.      Clinical Course as of 04/11/24 2341  Mon Apr 11, 2024  2229 Discussed with nurse Shanda.  Shanda took report.  Advises called for a fall in his care home.  Believed to strike head on a piece of furniture.  EMS reports they were unable to get much information, but there was some concern that he was not able to get up without assistance which is not typical. [MQ]  2242 Patient tolerated staples very well.  Bleeding well-controlled.  Exam to the base and no foreign bodies, irrigated extensively 200 mL of normal saline.  Bleeding well-controlled [MQ]    Clinical Course User Index [MQ] Dicky Anes, MD   Ongoing care and disposition assigned to Dr. Willo.  Follow-up on pending laboratory studies and CT head and cervical spine.  Anticipate likely discharge back to his care facility if remaining workup reassuring  FINAL CLINICAL IMPRESSION(S) / ED DIAGNOSES   Final diagnoses:  Injury of head, initial encounter  Scalp laceration, initial encounter     Rx / DC Orders   ED Discharge Orders     None        Note:  This document was prepared using Dragon voice recognition software and may include unintentional dictation errors.   Dicky Anes, MD 04/11/24 [phone]

## 2024-04-11 NOTE — ED Provider Notes (Signed)
-----------------------------------------   11:06 PM on 04/11/2024 -----------------------------------------  Blood pressure (!) 163/85, pulse 69, temperature 97.6 F (36.4 C), temperature source Oral, resp. rate 18, SpO2 100%.  Assuming care from Dr. Dicky.  In short, Wayne Jones is a 63 y.o. male with a chief complaint of No chief complaint on file. SABRA  Refer to the original H&P for additional details.  The current plan of care is to follow-up CT imaging and labs, if unremarkable then plan for dc home  ----------------------------------------- 12:09 AM on 04/12/2024 ----------------------------------------- CT head and cervical spine are negative for acute process, remainder of labs are unremarkable.  Patient is at his baseline mental status per group home staff at bedside and is appropriate for discharge home for outpatient follow-up for staple removal in 10 days.  Staff counseled to have him return to the ED for new or worsening symptoms, patient and staff agree with plan.     Willo Dunnings, MD 04/12/24 LARI

## 2024-04-11 NOTE — ED Triage Notes (Signed)
 Pt arrived via [facility] from [facility] post unwitnessed fall. Per staff, pt fall was heard and pt found on floor and had struck his posterior head. Pt has V shaped laceration with minimal bleeding noted on arrival. Per staff, no LOC but had difficulty with ambulating post fall.    Hx/o MR

## 2024-04-18 ENCOUNTER — Ambulatory Visit: Admitting: Family Medicine

## 2024-04-18 ENCOUNTER — Encounter: Payer: Self-pay | Admitting: Family Medicine

## 2024-04-18 VITALS — BP 128/78 | HR 79 | Resp 16 | Ht 72.0 in | Wt 200.3 lb

## 2024-04-18 DIAGNOSIS — M25461 Effusion, right knee: Secondary | ICD-10-CM | POA: Diagnosis not present

## 2024-04-18 DIAGNOSIS — N138 Other obstructive and reflux uropathy: Secondary | ICD-10-CM | POA: Diagnosis not present

## 2024-04-18 DIAGNOSIS — S0101XD Laceration without foreign body of scalp, subsequent encounter: Secondary | ICD-10-CM | POA: Diagnosis not present

## 2024-04-18 DIAGNOSIS — F79 Unspecified intellectual disabilities: Secondary | ICD-10-CM | POA: Diagnosis not present

## 2024-04-18 DIAGNOSIS — M4802 Spinal stenosis, cervical region: Secondary | ICD-10-CM

## 2024-04-18 DIAGNOSIS — I10 Essential (primary) hypertension: Secondary | ICD-10-CM | POA: Diagnosis not present

## 2024-04-18 DIAGNOSIS — N401 Enlarged prostate with lower urinary tract symptoms: Secondary | ICD-10-CM | POA: Diagnosis not present

## 2024-04-18 DIAGNOSIS — M47812 Spondylosis without myelopathy or radiculopathy, cervical region: Secondary | ICD-10-CM | POA: Diagnosis not present

## 2024-04-18 DIAGNOSIS — E538 Deficiency of other specified B group vitamins: Secondary | ICD-10-CM | POA: Diagnosis not present

## 2024-04-18 DIAGNOSIS — Z9181 History of falling: Secondary | ICD-10-CM | POA: Diagnosis not present

## 2024-04-18 DIAGNOSIS — Z8639 Personal history of other endocrine, nutritional and metabolic disease: Secondary | ICD-10-CM | POA: Diagnosis not present

## 2024-04-18 DIAGNOSIS — M170 Bilateral primary osteoarthritis of knee: Secondary | ICD-10-CM | POA: Diagnosis not present

## 2024-04-18 DIAGNOSIS — Z8673 Personal history of transient ischemic attack (TIA), and cerebral infarction without residual deficits: Secondary | ICD-10-CM | POA: Diagnosis not present

## 2024-04-18 DIAGNOSIS — E785 Hyperlipidemia, unspecified: Secondary | ICD-10-CM | POA: Diagnosis not present

## 2024-04-18 NOTE — Progress Notes (Signed)
 Name: Wayne Jones   MRN: [id number]    DOB: 1961-07-22   Date:04/18/2024       Progress Note  Subjective  Chief Complaint  Chief Complaint  Patient presents with   Suture / Staple Removal    ER visit had a fall    Discussed the use of AI scribe software for clinical note transcription with the patient, who gave verbal consent to proceed.  History of Present Illness Wayne Jones is a 63 year old male with cognitive disability who presents with a recent fall resulting in head injury. He is accompanied by Penne, his caregiver.  He experienced a fall on April 11, 2024, resulting in a head injury that required eight staples. The fall occurred when he was getting into bed, and his right knee, which has severe arthritis, gave out, causing him to roll and hit his head. He was responsive immediately after the fall and did not lose consciousness. He was taken to the emergency room where his blood pressure was noted to be high, and his sodium levels were slightly low.  He has a history of arthritis in both knees, with the right knee being particularly affected. He experiences limping and pain.  He resides in a group home due to cognitive disability, which makes it challenging to obtain a detailed history from him directly. His caregiver, Penne, provides assistance and has been monitoring his condition closely. His blood pressure has been elevated at home, with a reading of 156/81 noted last Monday.  His current medications and specific dosages were not discussed in the conversation.   Reviewed CT cervical spine and brain - old CVA and also DDD cervical spine and spinal stenosis noticed   Some abnormal labs and we will recheck today   Patient Active Problem List   Diagnosis Date Noted   Thyroid  mass 12/05/2022   Splenomegaly 10/28/2022   Primary osteoarthritis of both knees 01/16/2022   B12 deficiency 01/16/2022   Moderate protein-calorie malnutrition (HCC) 01/16/2022   Bilateral  groin pain 12/10/2021   Weight loss, unintentional    Abnormal CT scan, stomach    Polyp of colon    Osteoarthritis of knee 03/26/2020   Varicose veins, lower extremity, with inflammation, ulcerated, unspecified laterality (HCC) 05/26/2017   Bilateral lower extremity edema 04/14/2017   Thrombocytopenia (HCC) 07/20/2016   Intellectual disability 03/17/2016   Polypharmacy 01/10/2016   Benign prostatic hyperplasia without urinary obstruction 01/09/2015   Chronic kidney disease (CKD), stage I 01/09/2015   Cognitive decline 01/09/2015   History of diabetes mellitus 01/09/2015   GERD without esophagitis 01/09/2015   Hearing loss 01/09/2015   Dyslipidemia 01/09/2015   Morbid obesity, unspecified obesity type (HCC) 01/09/2015   Obstructive sleep apnea 01/09/2015   Other specified causes of urethral stricture 01/09/2015   Hernia of anterior abdominal wall 01/09/2015   BPH with obstruction/lower urinary tract symptoms 07/09/2012   Incomplete bladder emptying 07/09/2012   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   Benign essential HTN 02/04/2007   Bipolar I disorder, single manic episode, moderate (HCC) 02/04/2007    Social History   Tobacco Use   Smoking status: Never   Smokeless tobacco: Never  Substance Use Topics   Alcohol use: No    Alcohol/week: 0.0 standard drinks of alcohol     Current Outpatient Medications:    acetaminophen (TYLENOL) 500 MG tablet, Take 500 mg by mouth 3 (three) times daily., Disp: , Rfl:    antiseptic oral rinse (BIOTENE) LIQD, 15 mLs by Mouth Rinse  route as needed for dry mouth., Disp: , Rfl:    aripiprazole (ABILIFY) 10 MG disintegrating tablet, Take 10 mg by mouth daily., Disp: , Rfl:    atorvastatin  (LIPITOR) 10 MG tablet, Take 1 tablet (10 mg total) by mouth daily., Disp: 30 tablet, Rfl: 5   carbamazepine  (EQUETRO ) 200 MG CP12 12 hr capsule, Take 200 mg by mouth 2 (two) times daily., Disp: , Rfl:    carbamide peroxide (DEBROX) 6.5 % OTIC solution, PLACE 5  DROPS INTO BOTH EARS TWO TIMES A DAY, Disp: 15 mL, Rfl: 0   clonazePAM (KLONOPIN) 1 MG tablet, Take 1 mg by mouth daily., Disp: , Rfl:    Cyanocobalamin  1000 MCG/15ML LIQD, Take 5 mLs (333.3333 mcg total) by mouth daily., Disp: 450 mL, Rfl: 5   DULoxetine (CYMBALTA) 60 MG capsule, Take 60 mg by mouth daily., Disp: , Rfl:    Elastic Bandages & Supports (MEDICAL COMPRESSION SOCKS) MISC, 2 each by Does not apply route daily. Apply in am's and remove it at bedtime, Disp: 2 each, Rfl: 1   famotidine  (PEPCID ) 40 MG tablet, Take 1 tablet (40 mg total) by mouth daily., Disp: 30 tablet, Rfl: 5   feeding supplement, ENSURE COMPLETE, (ENSURE COMPLETE) LIQD, Take 237 mLs by mouth 2 (two) times daily between meals., Disp: 237 mL, Rfl: 3   fexofenadine  (ALLEGRA ) 60 MG tablet, TAKE 1 TABLET BY MOUTH TWICE DAILY, Disp: 60 tablet, Rfl: 2   finasteride  (PROSCAR ) 5 MG tablet, Take 1 tablet (5 mg total) by mouth daily., Disp: 30 tablet, Rfl: 5   fluticasone  (FLONASE ) 50 MCG/ACT nasal spray, PLACE 2 SPRAYS INTO BOTH NOSTRILS ONCE DAILY  *SHAKE GENTLY*, Disp: 16 g, Rfl: 1   gabapentin (NEURONTIN) 300 MG capsule, Take 300 mg by mouth in the morning., Disp: , Rfl:    gabapentin (NEURONTIN) 600 MG tablet, Take 600 mg by mouth at bedtime., Disp: , Rfl:    hydrOXYzine (ATARAX) 10 MG tablet, Take 10 mg by mouth 3 (three) times daily., Disp: , Rfl:    lisinopril  (ZESTRIL ) 5 MG tablet, Take 1 tablet (5 mg total) by mouth daily., Disp: 30 tablet, Rfl: 5   Lubricants (K-Y JELLY) GEL, USE  AS NEEDED TO BE ABLE TO MASTURBATE, Disp: 57 g, Rfl: 10   meloxicam  (MOBIC ) 15 MG tablet, Take 1 tablet (15 mg total) by mouth daily as needed for pain. Please don't give it daily, Disp: 30 tablet, Rfl: 1   nystatin  cream (MYCOSTATIN ), Apply topically 2 (two) times daily., Disp: 30 g, Rfl: 5   omega-3 acid ethyl esters (LOVAZA ) 1 g capsule, TAKE 1 CAPSULE BY MOUTH TWICE DAILY  *DO NOT CRUSH OR CHEW*, Disp: 2 capsule, Rfl: 10   omeprazole   (PRILOSEC) 20 MG capsule, TAKE 1 CAPSULE BY MOUTH ONCE DAILY *DO NOT CRUSH OR CHEW*, Disp: 30 capsule, Rfl: 5   Polyethyl Glycol-Propyl Glycol (SYSTANE) 0.4-0.3 % SOLN, Apply to eye., Disp: , Rfl:    tamsulosin  (FLOMAX ) 0.4 MG CAPS capsule, Take 1 capsule (0.4 mg total) by mouth daily., Disp: 30 capsule, Rfl: 5   Vitamin D, Ergocalciferol, (DRISDOL) 1.25 MG (50000 UNIT) CAPS capsule, Take 50,000 Units by mouth every 14 (fourteen) days., Disp: , Rfl:    Vitamins A & D (A+D PREVENT) OINT ointment, APPLY TOPICALLY AS NEEDED FOR DRY SKIN, Disp: 113 g, Rfl: 11  No Known Allergies  ROS  Ten systems reviewed and is negative except as mentioned in HPI    Objective  Vitals:  04/18/24 0954 04/18/24 1029  BP: (!) 148/88 128/78  Pulse: 79   Resp: 16   SpO2: 100%   Weight: 200 lb 4.8 oz (90.9 kg)   Height: 6' (1.829 m)     Body mass index is 27.17 kg/m.   Physical Exam  CONSTITUTIONAL: Patient appears well-developed and well-nourished. No distress. HEENT: Head atraumatic, normocephalic, neck supple. CARDIOVASCULAR: Normal rate, regular rhythm and normal heart sounds. No murmur heard. No BLE edema. PULMONARY: Effort normal and breath sounds normal. No respiratory distress. MUSCULOSKELETAL: Normal gait. Without gross motor or sensory deficit. Right knee effusion and swelling. PSYCHIATRIC: Patient has a normal mood and affect. Behavior is normal. Judgment and thought content normal. SKIN: Scalp laceration well healed.  Recent Results (from the past 2160 hours)  CBC     Status: Abnormal   Collection Time: 04/11/24  9:59 PM  Result Value Ref Range   WBC 5.4 4.0 - 10.5 K/uL   RBC 4.05 (L) 4.22 - 5.81 MIL/uL   Hemoglobin 13.5 13.0 - 17.0 g/dL   HCT 63.0 (L) 60.9 - 47.9 %   MCV 91.1 80.0 - 100.0 fL   MCH 33.3 26.0 - 34.0 pg   MCHC 36.6 (H) 30.0 - 36.0 g/dL   RDW 87.8 88.4 - 84.4 %   Platelets 130 (L) 150 - 400 K/uL   nRBC 0.0 0.0 - 0.2 %    Comment: Performed at [facility],  [address] [place], [place] [address]  Comprehensive metabolic panel     Status: Abnormal   Collection Time: 04/11/24  9:59 PM  Result Value Ref Range   Sodium 133 (L) 135 - 145 mmol/L   Potassium 3.8 3.5 - 5.1 mmol/L   Chloride 97 (L) 98 - 111 mmol/L   CO2 24 22 - 32 mmol/L   Glucose, Bld 110 (H) 70 - 99 mg/dL    Comment: Glucose reference range applies only to samples taken after fasting for at least 8 hours.   BUN 14 8 - 23 mg/dL   Creatinine, Ser 9.48 (L) 0.61 - 1.24 mg/dL   Calcium  8.7 (L) 8.9 - 10.3 mg/dL   Total Protein 6.2 (L) 6.5 - 8.1 g/dL   Albumin 3.5 3.5 - 5.0 g/dL   AST 23 15 - 41 U/L   ALT 17 0 - 44 U/L   Alkaline Phosphatase 64 38 - 126 U/L   Total Bilirubin 0.7 0.0 - 1.2 mg/dL   GFR, Estimated >39 >39 mL/min    Comment: (NOTE) Calculated using the CKD-EPI Creatinine Equation (2021)    Anion gap 12 5 - 15    Comment: Performed at [facility], [address] [place], [place] [address]  Urinalysis, Routine w reflex microscopic -Urine, Clean Catch     Status: Abnormal   Collection Time: 04/11/24 10:52 PM  Result Value Ref Range   Color, Urine YELLOW (A) YELLOW   APPearance HAZY (A) CLEAR   Specific Gravity, Urine 1.010 1.005 - 1.030   pH 7.0 5.0 - 8.0   Glucose, UA NEGATIVE NEGATIVE mg/dL   Hgb urine dipstick NEGATIVE NEGATIVE   Bilirubin Urine NEGATIVE NEGATIVE   Ketones, ur NEGATIVE NEGATIVE mg/dL   Protein, ur NEGATIVE NEGATIVE mg/dL   Nitrite NEGATIVE NEGATIVE   Leukocytes,Ua NEGATIVE NEGATIVE    Comment: Performed at [facility], [address], [place], [place] [address]     Assessment & Plan Recent fall with scalp laceration Staple removal in our office today, tolerated it well  Sustained a fall with scalp laceration requiring staples, now removed. Wound well-healed. Fall possibly due to knee instability and improper bed entry technique. - Advise warm washcloth application to scalp for cleaning. - Recommend Vaseline  application to scalp for healing. - Discuss physical therapy referral for safe bed entry and exit training.  Effusion and bilateral primary osteoarthritis of knees, right worse than left Chronic bilateral knee osteoarthritis with significant effusion, right knee worse. Right knee instability may have contributed to fall. Surgery not an option due to rehabilitation and cognitive concerns. - Discuss potential physical therapy for mobility and stability improvement. - Consider environmental modifications to prevent falls, such as monitoring during bed entry and exit.  Cognitive disability Cognitive disability complicates history taking and may affect compliance with treatment and safety measures. Lives in group home with caregiver support. - Ensure caregiver informed about safety measures and fall prevention strategies. - Consider physical therapy to address mobility and safety concerns.  DDD cervical spine with spinal stenosis - not a candidate for surgery - per caregiver  Old CVA noticed on CT brain -continue statin therapy and bp control   Hypertension Blood pressure elevated at hospital and today at 148/88 mmHg. Previous home reading 156/81 mmHg. Possible contributing factors include anxiety and recent fall. BP normalized before he left  - Check blood pressure before discharge.

## 2024-04-19 ENCOUNTER — Ambulatory Visit: Payer: Self-pay | Admitting: Family Medicine

## 2024-04-19 LAB — LIPID PANEL
Cholesterol: 152 mg/dL (ref <200)
HDL: 71 mg/dL (ref >=40)
LDL Cholesterol (Calc): 67 mg/dL
Non-HDL Cholesterol (Calc): 81 mg/dL (ref <130)
Total CHOL/HDL Ratio: 2.1 (calc) (ref <5.0)
Triglycerides: 63 mg/dL (ref <150)

## 2024-04-19 LAB — B12 AND FOLATE PANEL
Folate: 17.4 ng/mL
Vitamin B-12: 433 pg/mL (ref 200–1100)

## 2024-04-19 LAB — HEMOGLOBIN A1C
Hgb A1c MFr Bld: 4.7 % (ref <5.7)
Mean Plasma Glucose: 88 mg/dL
eAG (mmol/L): 4.9 mmol/L

## 2024-04-19 LAB — PSA: PSA: 0.18 ng/mL (ref <=4.00)

## 2024-04-21 ENCOUNTER — Ambulatory Visit: Admitting: Podiatry

## 2024-04-21 ENCOUNTER — Encounter: Payer: Self-pay | Admitting: Podiatry

## 2024-04-21 DIAGNOSIS — B351 Tinea unguium: Secondary | ICD-10-CM | POA: Diagnosis not present

## 2024-04-21 DIAGNOSIS — M79675 Pain in left toe(s): Secondary | ICD-10-CM | POA: Diagnosis not present

## 2024-04-21 DIAGNOSIS — E119 Type 2 diabetes mellitus without complications: Secondary | ICD-10-CM

## 2024-04-21 DIAGNOSIS — L84 Corns and callosities: Secondary | ICD-10-CM

## 2024-04-21 DIAGNOSIS — M79674 Pain in right toe(s): Secondary | ICD-10-CM | POA: Diagnosis not present

## 2024-04-21 NOTE — Progress Notes (Signed)
  Subjective:  Patient ID: Wayne Jones, male    DOB: [birth year],  MRN: [id number]  Wayne Jones presents to clinic today for preventative diabetic foot care and callus(es) of both feet and painful mycotic toenails that are difficult to trim. Painful toenails interfere with ambulation. Aggravating factors include wearing enclosed shoe gear. Pain is relieved with periodic professional debridement. Painful calluses are aggravated when weightbearing with and without shoegear. Pain is relieved with periodic professional debridement. He is a resident of [facility]. Chief Complaint  Patient presents with   [facility]    Rm2    New problem(s): None.   PCP is Glenard Mire, MD. ARNETTA 04/18/2024.  No Known Allergies  Review of Systems: Negative except as noted in the HPI.  Objective: No changes noted in today's physical examination. There were no vitals filed for this visit. Wayne Jones is a pleasant 63 y.o. male in NAD. AAO x 3.  Vascular Examination: Capillary refill time immediate b/l. Palpable pedal pulses. Pedal hair present b/l. No pain with calf compression b/l. Skin temperature gradient WNL b/l. No cyanosis or clubbing b/l. No ischemia or gangrene noted b/l. No edema noted b/l LE.  Neurological Examination: Sensation grossly intact b/l with 10 gram monofilament. Vibratory sensation intact b/l.   Dermatological Examination: Pedal skin with normal turgor, texture and tone b/l.  No open wounds. No interdigital macerations.   Toenails 1-5 b/l thick, discolored, elongated with subungual debris and pain on dorsal palpation.   Hyperkeratotic lesion(s) submet head 1 b/l.  No erythema, no edema, no drainage, no fluctuance.  Musculoskeletal Examination: Muscle strength 5/5 to all lower extremity muscle groups bilaterally. Hammertoe deformity noted 2-5 b/l.  Radiographs: None  Assessment/Plan: 1. Pain due to onychomycosis of toenails of both feet   2. Callus   3.  Diabetes mellitus without complication [facility])     Patient was evaluated and treated. All patient's and/or POA's questions/concerns addressed on today's visit. Mycotic toenails 1-5 debrided in length and girth without incident. Callus(es) submet head 1 b/l pared with sharp debridement without incident. Continue daily foot inspections and monitor blood glucose per PCP/Endocrinologist's recommendations. Continue soft, supportive shoe gear daily. Report any pedal injuries to medical professional. Call office if there are any questions/concerns.  Return in about 3 months (around 07/21/2024).  Wayne Jones, DPM      [place] LOCATION: [address], [place] [address]                   Office [phone]   [facility] LOCATION: [address] [place], [place] [address] Office [phone]

## 2024-05-10 ENCOUNTER — Other Ambulatory Visit: Payer: Self-pay | Admitting: Family Medicine

## 2024-05-10 DIAGNOSIS — K219 Gastro-esophageal reflux disease without esophagitis: Secondary | ICD-10-CM

## 2024-05-10 DIAGNOSIS — E785 Hyperlipidemia, unspecified: Secondary | ICD-10-CM

## 2024-05-10 DIAGNOSIS — N138 Other obstructive and reflux uropathy: Secondary | ICD-10-CM

## 2024-05-10 DIAGNOSIS — I10 Essential (primary) hypertension: Secondary | ICD-10-CM

## 2024-05-19 ENCOUNTER — Other Ambulatory Visit: Payer: Self-pay | Admitting: Family Medicine

## 2024-05-31 ENCOUNTER — Other Ambulatory Visit: Payer: Self-pay | Admitting: Family Medicine

## 2024-05-31 DIAGNOSIS — J309 Allergic rhinitis, unspecified: Secondary | ICD-10-CM

## 2024-06-07 DIAGNOSIS — F3489 Other specified persistent mood disorders: Secondary | ICD-10-CM | POA: Diagnosis not present

## 2024-06-07 DIAGNOSIS — E041 Nontoxic single thyroid nodule: Secondary | ICD-10-CM | POA: Diagnosis not present

## 2024-06-10 ENCOUNTER — Inpatient Hospital Stay: Attending: Oncology

## 2024-06-10 ENCOUNTER — Encounter: Payer: Self-pay | Admitting: Oncology

## 2024-06-10 ENCOUNTER — Inpatient Hospital Stay (HOSPITAL_BASED_OUTPATIENT_CLINIC_OR_DEPARTMENT_OTHER): Admitting: Oncology

## 2024-06-10 VITALS — BP 115/63 | HR 76 | Temp 97.3°F | Resp 18 | Wt 203.6 lb

## 2024-06-10 DIAGNOSIS — D696 Thrombocytopenia, unspecified: Secondary | ICD-10-CM | POA: Diagnosis present

## 2024-06-10 DIAGNOSIS — E079 Disorder of thyroid, unspecified: Secondary | ICD-10-CM

## 2024-06-10 DIAGNOSIS — R161 Splenomegaly, not elsewhere classified: Secondary | ICD-10-CM | POA: Diagnosis not present

## 2024-06-10 LAB — CBC WITH DIFFERENTIAL (CANCER CENTER ONLY)
Abs Immature Granulocytes: 0.01 K/uL (ref 0.00–0.07)
Basophils Absolute: 0 K/uL (ref 0.0–0.1)
Basophils Relative: 0 %
Eosinophils Absolute: 0.1 K/uL (ref 0.0–0.5)
Eosinophils Relative: 2 %
HCT: 39.6 % (ref 39.0–52.0)
Hemoglobin: 14.1 g/dL (ref 13.0–17.0)
Immature Granulocytes: 0 %
Lymphocytes Relative: 20 %
Lymphs Abs: 0.9 K/uL (ref 0.7–4.0)
MCH: 32.6 pg (ref 26.0–34.0)
MCHC: 35.6 g/dL (ref 30.0–36.0)
MCV: 91.5 fL (ref 80.0–100.0)
Monocytes Absolute: 0.4 K/uL (ref 0.1–1.0)
Monocytes Relative: 9 %
Neutro Abs: 3.1 K/uL (ref 1.7–7.7)
Neutrophils Relative %: 69 %
Platelet Count: 124 K/uL — ABNORMAL LOW (ref 150–400)
RBC: 4.33 MIL/uL (ref 4.22–5.81)
RDW: 12 % (ref 11.5–15.5)
WBC Count: 4.5 K/uL (ref 4.0–10.5)
nRBC: 0 % (ref 0.0–0.2)

## 2024-06-10 LAB — CMP (CANCER CENTER ONLY)
ALT: 19 U/L (ref 0–44)
AST: 24 U/L (ref 15–41)
Albumin: 4.1 g/dL (ref 3.5–5.0)
Alkaline Phosphatase: 77 U/L (ref 38–126)
Anion gap: 9 (ref 5–15)
BUN: 16 mg/dL (ref 8–23)
CO2: 25 mmol/L (ref 22–32)
Calcium: 8.7 mg/dL — ABNORMAL LOW (ref 8.9–10.3)
Chloride: 96 mmol/L — ABNORMAL LOW (ref 98–111)
Creatinine: 0.52 mg/dL — ABNORMAL LOW (ref 0.61–1.24)
GFR, Estimated: >60 mL/min (ref >60)
Glucose, Bld: 90 mg/dL (ref 70–99)
Potassium: 3.9 mmol/L (ref 3.5–5.1)
Sodium: 130 mmol/L — ABNORMAL LOW (ref 135–145)
Total Bilirubin: 0.8 mg/dL (ref 0.0–1.2)
Total Protein: 6.8 g/dL (ref 6.5–8.1)

## 2024-06-10 LAB — LACTATE DEHYDROGENASE: LDH: 105 U/L (ref 98–192)

## 2024-06-10 NOTE — Progress Notes (Signed)
 Hematology/Oncology Progress note [phone] Fax:([phone]) [phone]     CHIEF COMPLAINTS/REASON FOR VISIT:  thrombocytopenia, weight loss   ASSESSMENT & PLAN:   Thrombocytopenia Thrombocytopenia, platelet counts are Stable.   This could be secondary to carbamazepine /Abilify, or due to splenomegaly. Continue observation.  Splenomegaly Persistent mild splenomegaly on US  JAK2 V617F mutation negative, with reflex to other mutations CALR, MPL, JAK 2 Ex 12-15 mutations negative. Low ANA titer 1:80, EBV/CMV negative.  Observation.    Thyroid  mass US  thyroid  was obtained.  Biopsy was negative per PCPs note.  Results not available to me. Recent US  thyroid  showed significant growth.-Follow up w endocrinology.     Orders Placed This Encounter  Procedures   CBC with Differential (Cancer Center Only)    Standing Status:   Future    Expected Date:   06/10/2025    Expiration Date:   09/08/2025   CMP (Cancer Center only)    Standing Status:   Future    Expected Date:   06/10/2025    Expiration Date:   09/08/2025   Lactate dehydrogenase    Standing Status:   Future    Expected Date:   06/10/2025    Expiration Date:   09/08/2025   Follow-up 12 months.  All questions were answered. The patient knows to call the clinic with any problems, questions or concerns.  Zelphia Cap, MD, PhD [facility] Hematology Oncology 06/10/2024    HISTORY OF PRESENTING ILLNESS:  Wayne Jones is a 63 y.o. male who was seen in consultation at the request of Sowles, Krichna, MD for evaluation of thrombocytopenia   Reviewed patient's labs done previously.  12/04/2020 labs showed decreased platelet counts at 99,000.  Normal wbc  hemoglobin  Reviewed patient's previous labs. Thrombocytopenia is chronic chronic onset , since at least 2014 with baseline around 100,000. Patient has a history of mental disorders and lives in a group home.  He is a poor historian.  Denies any new complaints.  Denies any easy  bleeding or bruising.  He was accompanied by staff member from the group home.  Staff denies any recent change of Abilify and carbamazepine .  INTERVAL HISTORY Wayne Jones is a 63 y.o. male who has above history reviewed by me today presents for follow up visit for management of thrombocytopenia and weight loss Patient was accompanied by group home staff.   He has gained weight Patient is a poor historian. Per group home staff, patient is eating ok.    Review of Systems  Unable to perform ROS: Other (Mental disorder)  Constitutional:  Negative for appetite change, fatigue and unexpected weight change.  Respiratory:  Negative for cough and shortness of breath.   Cardiovascular:  Negative for chest pain.  Genitourinary:  Negative for dysuria.   Hematological:  Negative for adenopathy. Does not bruise/bleed easily.    MEDICAL HISTORY:  Past Medical History:  Diagnosis Date   Depression    Diabetes mellitus without complication (HCC)    Hyperlipidemia    Hypertension    Mental disorder     SURGICAL HISTORY: Past Surgical History:  Procedure Laterality Date   COLONOSCOPY WITH PROPOFOL  N/A 11/19/2021   Procedure: COLONOSCOPY WITH PROPOFOL ;  Surgeon: Unk Corinn Skiff, MD;  Location: [facility] ENDOSCOPY;  Service: Gastroenterology;  Laterality: N/A;   ESOPHAGOGASTRODUODENOSCOPY (EGD) WITH PROPOFOL  N/A 11/19/2021   Procedure: ESOPHAGOGASTRODUODENOSCOPY (EGD) WITH PROPOFOL ;  Surgeon: Unk Corinn Skiff, MD;  Location: [facility] ENDOSCOPY;  Service: Gastroenterology;  Laterality: N/A;   LAPAROSCOPIC CHOLECYSTECTOMY  LAPAROSCOPY  12/03/2010   VENTRAL HERNIA REPAIR      SOCIAL HISTORY: Social History   Socioeconomic History   Marital status: Single    Spouse name: Not on file   Number of children: Not on file   Years of education: Not on file   Highest education level: Not on file  Occupational History   Occupation: disabled    Comment: Patient in care home  Tobacco Use    Smoking status: Never   Smokeless tobacco: Never  Vaping Use   Vaping status: Never Used  Substance and Sexual Activity   Alcohol use: No    Alcohol/week: 0.0 standard drinks of alcohol   Drug use: No   Sexual activity: Never  Other Topics Concern   Not on file  Social History Narrative   Arzella Honey calls him twice weekly. Pt resides at [facility] group home at [facility].    Social Drivers of Health   Financial Resource Strain: Low Risk  (07/16/2023)   Overall Financial Resource Strain (CARDIA)    Difficulty of Paying Living Expenses: Not hard at all  Food Insecurity: No Food Insecurity (07/16/2023)   Hunger Vital Sign    Worried About Running Out of Food in the Last Year: Never true    Ran Out of Food in the Last Year: Never true  Transportation Needs: No Transportation Needs (07/16/2023)   PRAPARE - Administrator, Civil Service (Medical): No    Lack of Transportation (Non-Medical): No  Physical Activity: Sufficiently Active (07/16/2023)   Exercise Vital Sign    Days of Exercise per Week: 5 days    Minutes of Exercise per Session: 30 min  Stress: No Stress Concern Present (07/16/2023)   [employer] of Occupational Health - Occupational Stress Questionnaire    Feeling of Stress : Not at all  Social Connections: Socially Isolated (07/16/2023)   Social Connection and Isolation Panel    Frequency of Communication with Friends and Family: Twice a week    Frequency of Social Gatherings with Friends and Family: Twice a week    Attends Religious Services: Never    Database Administrator or Organizations: No    Attends Banker Meetings: Never    Marital Status: Never married  Intimate Partner Violence: Not At Risk (07/16/2023)   Humiliation, Afraid, Rape, and Kick questionnaire    Fear of Current or Ex-Partner: No    Emotionally Abused: No    Physically Abused: No    Sexually Abused: No    FAMILY HISTORY: Family History  Family history  unknown: Yes    ALLERGIES:  has no known allergies.  MEDICATIONS:  Current Outpatient Medications  Medication Sig Dispense Refill   acetaminophen (TYLENOL) 500 MG tablet Take 500 mg by mouth 3 (three) times daily.     antiseptic oral rinse (BIOTENE) LIQD 15 mLs by Mouth Rinse route as needed for dry mouth.     aripiprazole (ABILIFY) 10 MG disintegrating tablet Take 10 mg by mouth daily.     atorvastatin  (LIPITOR) 10 MG tablet TAKE 1 TABLET BY MOUTH ONCE DAILY 30 tablet 5   carbamazepine  (EQUETRO ) 200 MG CP12 12 hr capsule Take 200 mg by mouth 2 (two) times daily.     carbamide peroxide (DEBROX) 6.5 % OTIC solution PLACE 5 DROPS INTO BOTH EARS TWO TIMES A DAY 15 mL 0   clonazePAM (KLONOPIN) 1 MG tablet Take 1 mg by mouth daily.     Cyanocobalamin  1000  MCG/15ML LIQD Take 5 mLs (333.3333 mcg total) by mouth daily. 450 mL 5   DULoxetine (CYMBALTA) 60 MG capsule Take 60 mg by mouth daily.     Elastic Bandages & Supports (MEDICAL COMPRESSION SOCKS) MISC 2 each by Does not apply route daily. Apply in am's and remove it at bedtime 2 each 1   famotidine  (PEPCID ) 40 MG tablet TAKE 1 TABLET BY MOUTH ONCE DAILY 30 tablet 5   feeding supplement, ENSURE COMPLETE, (ENSURE COMPLETE) LIQD Take 237 mLs by mouth 2 (two) times daily between meals. 237 mL 3   fexofenadine  (ALLEGRA ) 60 MG tablet TAKE 1 TABLET BY MOUTH TWICE DAILY 60 tablet 2   finasteride  (PROSCAR ) 5 MG tablet TAKE 1 TABLET BY MOUTH ONCE DAILY 30 tablet 5   fluticasone  (FLONASE ) 50 MCG/ACT nasal spray PLACE 2 SPRAYS INTO BOTH NOSTRILS ONCE DAILY  *SHAKE GENTLY* 16 g 1   gabapentin (NEURONTIN) 300 MG capsule Take 300 mg by mouth in the morning.     gabapentin (NEURONTIN) 600 MG tablet Take 600 mg by mouth at bedtime.     hydrOXYzine (ATARAX) 10 MG tablet Take 10 mg by mouth 3 (three) times daily.     lisinopril  (ZESTRIL ) 5 MG tablet TAKE 1 TABLET BY MOUTH ONCE DAILY 30 tablet 5   Lubricants (K-Y JELLY) GEL USE  AS NEEDED TO BE ABLE TO MASTURBATE  57 g 10   meloxicam  (MOBIC ) 15 MG tablet Take 1 tablet (15 mg total) by mouth daily as needed for pain. Please don't give it daily 30 tablet 1   nystatin  cream (MYCOSTATIN ) Apply topically 2 (two) times daily. 30 g 5   omega-3 acid ethyl esters (LOVAZA ) 1 g capsule TAKE 1 CAPSULE BY MOUTH TWICE DAILY  *DO NOT CRUSH OR CHEW* 2 capsule 10   omeprazole  (PRILOSEC) 20 MG capsule TAKE 1 CAPSULE BY MOUTH ONCE DAILY *DO NOT CRUSH OR CHEW* *TAKE ON AN EMPTY STOMACH* 30 capsule 5   Polyethyl Glycol-Propyl Glycol (SYSTANE) 0.4-0.3 % SOLN Apply to eye.     tamsulosin  (FLOMAX ) 0.4 MG CAPS capsule TAKE 1 CAPSULE BY MOUTH ONCE DAILY *DO NOT CRUSH OR CHEW* **DO NOT OPEN CAPSULE** 30 capsule 5   Vitamin D, Ergocalciferol, (DRISDOL) 1.25 MG (50000 UNIT) CAPS capsule Take 50,000 Units by mouth every 14 (fourteen) days.     Vitamins A & D (A+D PREVENT) OINT ointment APPLY TOPICALLY AS NEEDED FOR DRY SKIN 113 g 11   No current facility-administered medications for this visit.     PHYSICAL EXAMINATION: ECOG PERFORMANCE STATUS: 1 - Symptomatic but completely ambulatory Vitals:   06/10/24 0947  BP: 115/63  Pulse: 76  Resp: 18  Temp: (!) 97.3 F (36.3 C)   Filed Weights   06/10/24 0947  Weight: 203 lb 9.6 oz (92.4 kg)    Physical Exam Constitutional:      General: He is not in acute distress. HENT:     Head: Normocephalic and atraumatic.  Eyes:     General: No scleral icterus. Cardiovascular:     Rate and Rhythm: Normal rate and regular rhythm.  Pulmonary:     Effort: Pulmonary effort is normal. No respiratory distress.     Breath sounds: Normal breath sounds.  Abdominal:     General: There is no distension.     Palpations: Abdomen is soft. There is no mass.     Tenderness: There is no abdominal tenderness.  Musculoskeletal:        General: No deformity. Normal  range of motion.     Cervical back: Normal range of motion and neck supple.  Skin:    Findings: No erythema or rash.  Neurological:      Mental Status: He is alert. Mental status is at baseline.  Psychiatric:     Comments: Flat affect      LABORATORY DATA:  I have reviewed the data as listed Lab Results  Component Value Date   WBC 4.5 06/10/2024   HGB 14.1 06/10/2024   HCT 39.6 06/10/2024   MCV 91.5 06/10/2024   PLT 124 (L) 06/10/2024   Recent Labs    12/09/23 0946 04/11/24 2159 06/10/24 0940  NA 135 133* 130*  K 4.0 3.8 3.9  CL 100 97* 96*  CO2 27 24 25   GLUCOSE 90 110* 90  BUN 14 14 16   CREATININE 0.68 0.51* 0.52*  CALCIUM  8.8* 8.7* 8.7*  GFRNONAA >60 >60 >60  PROT 6.6 6.2* 6.8  ALBUMIN 4.1 3.5 4.1  AST 21 23 24   ALT 18 17 19   ALKPHOS 71 64 77  BILITOT 0.7 0.7 0.8    RADIOGRAPHIC STUDIES: I have personally reviewed the radiological images as listed and agreed with the findings in the report.  No results found.

## 2024-06-10 NOTE — Assessment & Plan Note (Signed)
Thrombocytopenia, platelet counts are  Stable.  This could be secondary to carbamazepine/Abilify, or due to splenomegaly. Continue observation.

## 2024-06-10 NOTE — Assessment & Plan Note (Signed)
 US  thyroid  was obtained.  Biopsy was negative per PCPs note.  Results not available to me. Recent US  thyroid  showed significant growth.-Follow up w endocrinology.

## 2024-06-10 NOTE — Assessment & Plan Note (Addendum)
Persistent mild splenomegaly on [place] JAK2 V617F mutation negative, with reflex to other mutations CALR, MPL, JAK 2 Ex 12-15 mutations negative. Low ANA titer 1:80, EBV/CMV negative.  Observation.

## 2024-06-14 ENCOUNTER — Other Ambulatory Visit: Payer: Self-pay | Admitting: Family Medicine

## 2024-06-14 DIAGNOSIS — E785 Hyperlipidemia, unspecified: Secondary | ICD-10-CM

## 2024-06-14 DIAGNOSIS — E041 Nontoxic single thyroid nodule: Secondary | ICD-10-CM | POA: Diagnosis not present

## 2024-06-16 NOTE — Telephone Encounter (Signed)
 Requested medications are due for refill today.  unsure  Requested medications are on the active medications list.  yes  Last refill. 06/29/2023 2 capsules, 10 rf  Future visit scheduled.   yes  Notes to clinic.  Please review for refill.    Requested Prescriptions  Pending Prescriptions Disp Refills   omega-3 acid ethyl esters (LOVAZA ) 1 g capsule [Pharmacy Med Name: Omega-3-acid  Ethyl Esters 1 GM Capsule] 14 capsule 10    Sig: TAKE 1 CAPSULE BY MOUTH TWICE DAILY  *DO NOT CRUSH OR CHEW*     Endocrinology:  Nutritional Agents - omega-3 acid ethyl esters Failed - 06/16/2024  2:56 PM      Failed - Lipid Panel in normal range within the last 12 months    Cholesterol  Date Value Ref Range Status  04/18/2024 152 <200 mg/dL Final   LDL Cholesterol (Calc)  Date Value Ref Range Status  04/18/2024 67 mg/dL (calc) Final    Comment:    Reference range: <100 . Desirable range <100 mg/dL for primary prevention;   <70 mg/dL for patients with CHD or diabetic patients  with > or = 2 CHD risk factors. SABRA LDL-C is now calculated using the Martin-Hopkins  calculation, which is a validated novel method providing  better accuracy than the Friedewald equation in the  estimation of LDL-C.  Gladis APPLETHWAITE et al. SANDREA. 7986;689(80): 2061-2068  (http://education.[facility].com/faq/FAQ164)    HDL  Date Value Ref Range Status  04/18/2024 71 > OR = 40 mg/dL Final   Triglycerides  Date Value Ref Range Status  04/18/2024 63 <150 mg/dL Final         Passed - Valid encounter within last 12 months    Recent Outpatient Visits           1 month ago Effusion, right knee   [facility] Glenard Mire, MD   2 months ago Bipolar I disorder, single manic episode, moderate [facility])   [facility], Mire, MD   8 months ago Bipolar I disorder, single manic episode, moderate [facility])   [facility] Sowles, Krichna, MD        Future Appointments             In 3 months Glenard, Krichna, MD [facility], [place]

## 2024-06-24 ENCOUNTER — Other Ambulatory Visit: Payer: Self-pay | Admitting: Family Medicine

## 2024-06-26 NOTE — Telephone Encounter (Signed)
 Requested medication (s) are due for refill today - expired Rx  Requested medication (s) are on the active medication list -yes  Future visit scheduled -yes  Last refill: 03/13/22  Notes to clinic: high dose medication -requires provider review, listed as histrorical  Requested Prescriptions  Pending Prescriptions Disp Refills   Vitamin D, Ergocalciferol, (DRISDOL) 1.25 MG (50000 UNIT) CAPS capsule [Pharmacy Med Name: Vitamin D (Ergocalciferol) 1.25 MG (50000 UT) Capsule] 2 capsule 11    Sig: TAKE 1 CAPSULE BY MOUTH EVERY 14 DAYS *DO NOT CRUSH OR CHEW*     Endocrinology:  Vitamins - Vitamin D Supplementation 2 Failed - 06/26/2024  8:32 AM      Failed - Manual Review: Route requests for 50,000 IU strength to the provider      Failed - Ca in normal range and within 360 days    Calcium   Date Value Ref Range Status  06/10/2024 8.7 (L) 8.9 - 10.3 mg/dL Final   Calcium , Total  Date Value Ref Range Status  04/10/2013 8.5 8.5 - 10.1 mg/dL Final         Failed - Vitamin D in normal range and within 360 days    No results found for: [id number], [id number], [id number], 25OHVITD3, 25OHVITD2, 25OHVITD1, VD25OH       Passed - Valid encounter within last 12 months    Recent Outpatient Visits           2 months ago Effusion, right knee   [facility] Glenard Mire, MD   2 months ago Bipolar I disorder, single manic episode, moderate (HCC)   [facility] Glenard Mire, MD   8 months ago Bipolar I disorder, single manic episode, moderate (HCC)   [facility] Sowles, Krichna, MD       Future Appointments             In 3 months Glenard, Krichna, MD [facility], [place]               Requested Prescriptions  Pending Prescriptions Disp Refills   Vitamin D, Ergocalciferol, (DRISDOL) 1.25 MG (50000 UNIT) CAPS capsule [Pharmacy Med Name: Vitamin D  (Ergocalciferol) 1.25 MG (50000 UT) Capsule] 2 capsule 11    Sig: TAKE 1 CAPSULE BY MOUTH EVERY 14 DAYS *DO NOT CRUSH OR CHEW*     Endocrinology:  Vitamins - Vitamin D Supplementation 2 Failed - 06/26/2024  8:32 AM      Failed - Manual Review: Route requests for 50,000 IU strength to the provider      Failed - Ca in normal range and within 360 days    Calcium   Date Value Ref Range Status  06/10/2024 8.7 (L) 8.9 - 10.3 mg/dL Final   Calcium , Total  Date Value Ref Range Status  04/10/2013 8.5 8.5 - 10.1 mg/dL Final         Failed - Vitamin D in normal range and within 360 days    No results found for: [id number], [id number], [id number], 25OHVITD3, 25OHVITD2, 25OHVITD1, VD25OH       Passed - Valid encounter within last 12 months    Recent Outpatient Visits           2 months ago Effusion, right knee   [facility] Glenard Mire, MD   2 months ago Bipolar I disorder, single manic episode, moderate [facility])   [place] [facility] Sowles, Krichna, MD   8 months ago  Bipolar I disorder, single manic episode, moderate [facility])   [facility] Sowles, Krichna, MD       Future Appointments             In 3 months Glenard, Krichna, MD [facility], [place]

## 2024-07-21 ENCOUNTER — Other Ambulatory Visit: Payer: Self-pay

## 2024-07-21 ENCOUNTER — Ambulatory Visit: Payer: Self-pay

## 2024-07-21 DIAGNOSIS — Z0000 Encounter for general adult medical examination without abnormal findings: Secondary | ICD-10-CM

## 2024-07-21 DIAGNOSIS — M170 Bilateral primary osteoarthritis of knee: Secondary | ICD-10-CM

## 2024-07-21 NOTE — Progress Notes (Signed)
 Chief Complaint  Patient presents with   Medicare Wellness     Subjective:   [name] is a 63 y.o. male who presents for a Medicare Annual Wellness Visit.  Visit info / Clinical Intake: Medicare Wellness Visit Type:: Subsequent Annual Wellness Visit Persons participating in visit and providing information:: caregiver (with patient present) Medicare Wellness Visit Mode:: Telephone If telephone:: video declined Since this visit was completed virtually, some vitals may be partially provided or unavailable. Missing vitals are due to the limitations of the virtual format.: Unable to obtain vitals - no equipment If Telephone or Video please confirm:: I connected with patient using audio/video enable telemedicine. I verified patient identity with two identifiers, discussed telehealth limitations, and patient agreed to proceed. Patient Location:: HOME Provider Location:: OFFICE Interpreter Needed?: No Pre-visit prep was completed: yes AWV questionnaire completed by patient prior to visit?: no Living arrangements:: other (LIVES IN GROUP HOME) Patient's Overall Health Status Rating: good Typical amount of pain: some (KNEES) Does pain affect daily life?: no Are you currently prescribed opioids?: no  Dietary Habits and Nutritional Risks How many meals a day?: 3 (SNACKS) Eats fruit and vegetables daily?: yes Most meals are obtained by: having others provide food In the last 2 weeks, have you had any of the following?: none Diabetic:: no  Functional Status Activities of Daily Living (to include ambulation/medication): Independent Ambulation: Independent Medication Administration: Dependent Is this a change from baseline?: Pre-admission baseline Home Management (perform basic housework or laundry): Dependent Manage your own finances?: (!) no Primary transportation is: facility / other Concerns about vision?: no *vision screening is required for WTM* (NO GLASSES) Concerns about  hearing?: no  Fall Screening Falls in the past year?: 1 Number of falls in past year: 0 Was there an injury with Fall?: 1 Fall Risk Category Calculator: 2 Patient Fall Risk Level: Moderate Fall Risk  Fall Risk Patient at Risk for Falls Due to: History of fall(s) Fall risk Follow up: Falls evaluation completed; Falls prevention discussed  Home and Transportation Safety: All rugs have non-skid backing?: yes All stairs or steps have railings?: N/A, no stairs Grab bars in the bathtub or shower?: yes Have non-skid surface in bathtub or shower?: yes Good home lighting?: yes Regular seat belt use?: yes Hospital stays in the last year:: no  Cognitive Assessment Difficulty concentrating, remembering, or making decisions? : yes Will 6CIT or Mini Cog be Completed: no 6CIT or Mini Cog Declined: patient has a diagnosis of dementia or cognitive impairment  Advance Directives (For Healthcare) Does Patient Have a Medical Advance Directive?: No Would patient like information on creating a medical advance directive?: No - Guardian declined  Reviewed/Updated  Reviewed/Updated: Reviewed All (Medical, Surgical, Family, Medications, Allergies, Care Teams, Patient Goals)    Allergies (verified) Patient has no known allergies.   Current Medications (verified) Outpatient Encounter Medications as of 07/21/2024  Medication Sig   acetaminophen (TYLENOL) 500 MG tablet Take 500 mg by mouth 3 (three) times daily.   antiseptic oral rinse (BIOTENE) LIQD 15 mLs by Mouth Rinse route as needed for dry mouth.   aripiprazole (ABILIFY) 10 MG disintegrating tablet Take 10 mg by mouth daily.   atorvastatin  (LIPITOR) 10 MG tablet TAKE 1 TABLET BY MOUTH ONCE DAILY   carbamazepine  (EQUETRO ) 200 MG CP12 12 hr capsule Take 200 mg by mouth 2 (two) times daily.   carbamide peroxide (DEBROX) 6.5 % OTIC solution PLACE 5 DROPS INTO BOTH EARS TWO TIMES A DAY   clonazePAM (KLONOPIN)  1 MG tablet Take 1 mg by mouth daily.    Cyanocobalamin  1000 MCG/15ML LIQD Take 5 mLs (333.3333 mcg total) by mouth daily.   DULoxetine (CYMBALTA) 60 MG capsule Take 60 mg by mouth daily.   Elastic Bandages & Supports (MEDICAL COMPRESSION SOCKS) MISC 2 each by Does not apply route daily. Apply in am's and remove it at bedtime   famotidine  (PEPCID ) 40 MG tablet TAKE 1 TABLET BY MOUTH ONCE DAILY   feeding supplement, ENSURE COMPLETE, (ENSURE COMPLETE) LIQD Take 237 mLs by mouth 2 (two) times daily between meals.   fexofenadine  (ALLEGRA ) 60 MG tablet TAKE 1 TABLET BY MOUTH TWICE DAILY   finasteride  (PROSCAR ) 5 MG tablet TAKE 1 TABLET BY MOUTH ONCE DAILY   fluticasone  (FLONASE ) 50 MCG/ACT nasal spray PLACE 2 SPRAYS INTO BOTH NOSTRILS ONCE DAILY  *SHAKE GENTLY*   gabapentin (NEURONTIN) 300 MG capsule Take 300 mg by mouth in the morning.   gabapentin (NEURONTIN) 600 MG tablet Take 600 mg by mouth at bedtime.   hydrOXYzine (ATARAX) 10 MG tablet Take 10 mg by mouth 3 (three) times daily.   lisinopril  (ZESTRIL ) 5 MG tablet TAKE 1 TABLET BY MOUTH ONCE DAILY   Lubricants (K-Y JELLY) GEL USE  AS NEEDED TO BE ABLE TO MASTURBATE   meloxicam  (MOBIC ) 15 MG tablet Take 1 tablet (15 mg total) by mouth daily as needed for pain. Please don't give it daily   nystatin  cream (MYCOSTATIN ) Apply topically 2 (two) times daily.   omega-3 acid ethyl esters (LOVAZA ) 1 g capsule TAKE 1 CAPSULE BY MOUTH TWICE DAILY  *DO NOT CRUSH OR CHEW*   omeprazole  (PRILOSEC) 20 MG capsule TAKE 1 CAPSULE BY MOUTH ONCE DAILY *DO NOT CRUSH OR CHEW* *TAKE ON AN EMPTY STOMACH*   Polyethyl Glycol-Propyl Glycol (SYSTANE) 0.4-0.3 % SOLN Apply to eye.   tamsulosin  (FLOMAX ) 0.4 MG CAPS capsule TAKE 1 CAPSULE BY MOUTH ONCE DAILY *DO NOT CRUSH OR CHEW* **DO NOT OPEN CAPSULE**   Vitamin D, Ergocalciferol, (DRISDOL) 1.25 MG (50000 UNIT) CAPS capsule TAKE 1 CAPSULE BY MOUTH EVERY 14 DAYS *DO NOT CRUSH OR CHEW*   Vitamins A & D (A+D PREVENT) OINT ointment APPLY TOPICALLY AS NEEDED FOR DRY  SKIN   No facility-administered encounter medications on file as of 07/21/2024.    History: Past Medical History:  Diagnosis Date   Depression    Diabetes mellitus without complication (HCC)    Hyperlipidemia    Hypertension    Mental disorder    Past Surgical History:  Procedure Laterality Date   COLONOSCOPY WITH PROPOFOL  N/A 11/19/2021   Procedure: COLONOSCOPY WITH PROPOFOL ;  Surgeon: Unk Corinn Skiff, MD;  Location: [facility] ENDOSCOPY;  Service: Gastroenterology;  Laterality: N/A;   ESOPHAGOGASTRODUODENOSCOPY (EGD) WITH PROPOFOL  N/A 11/19/2021   Procedure: ESOPHAGOGASTRODUODENOSCOPY (EGD) WITH PROPOFOL ;  Surgeon: Unk Corinn Skiff, MD;  Location: [facility] ENDOSCOPY;  Service: Gastroenterology;  Laterality: N/A;   LAPAROSCOPIC CHOLECYSTECTOMY     LAPAROSCOPY  12/03/2010   VENTRAL HERNIA REPAIR     Family History  Family history unknown: Yes   Social History   Occupational History   Occupation: disabled    Comment: Patient in care home  Tobacco Use   Smoking status: Never   Smokeless tobacco: Never  Vaping Use   Vaping status: Never Used  Substance and Sexual Activity   Alcohol use: No    Alcohol/week: 0.0 standard drinks of alcohol   Drug use: No   Sexual activity: Never   Tobacco Counseling Counseling given:  Not Answered  SDOH Screenings   Food Insecurity: No Food Insecurity (06/14/2024)   Received from [facility]  Housing: Low Risk  (06/14/2024)   Received from [facility]  Transportation Needs: No Transportation Needs (06/14/2024)   [facility]  Utilities: Not At Risk (06/14/2024)   Received from [facility]  Alcohol Screen: Low Risk (07/16/2023)  Depression (PHQ2-9): Low Risk (07/21/2024)  Financial Resource Strain: Low Risk  (06/14/2024)   Received from [facility]  Physical Activity: Sufficiently Active (07/21/2024)  Social Connections: Socially  Isolated (07/16/2023)  Stress: No Stress Concern Present (07/21/2024)  Tobacco Use: Low Risk (07/21/2024)  Health Literacy: Adequate Health Literacy (07/16/2023)   See flowsheets for full screening details  Depression Screen PHQ 2 & 9 Depression Scale- Over the past 2 weeks, how often have you been bothered by any of the following problems? Little interest or pleasure in doing things: 0 Feeling down, depressed, or hopeless (PHQ Adolescent also includes...irritable): 0 PHQ-2 Total Score: 0 Trouble falling or staying asleep, or sleeping too much: 0 Feeling tired or having little energy: 0 Poor appetite or overeating (PHQ Adolescent also includes...weight loss): 0 Feeling bad about yourself - or that you are a failure or have let yourself or your family down: 0 Trouble concentrating on things, such as reading the newspaper or watching television (PHQ Adolescent also includes...like school work): 0 Moving or speaking so slowly that other people could have noticed. Or the opposite - being so fidgety or restless that you have been moving around a lot more than usual: 0 Thoughts that you would be better off dead, or of hurting yourself in some way: 0 PHQ-9 Total Score: 0 If you checked off any problems, how difficult have these problems made it for you to do your work, take care of things at home, or get along with other people?: Not difficult at all  Depression Treatment Depression Interventions/Treatment : EYV7-0 Score <4 Follow-up Not Indicated     Goals Addressed             This Visit's Progress    Cut out extra servings               Objective:    There were no vitals filed for this visit. There is no height or weight on file to calculate BMI.  Hearing/Vision screen Hearing Screening - Comments:: NO AIDS Vision Screening - Comments:: NO GLASSES Immunizations and Health Maintenance Health Maintenance  Topic Date Due   HIV Screening  Never done   Diabetic kidney  evaluation - Urine ACR  09/28/2019   Zoster Vaccines- Shingrix  (2 of 2) 02/15/2022   Influenza Vaccine  03/04/2024   COVID-19 Vaccine (5 - 2025-26 season) 04/04/2024   Fecal DNA (Cologuard)  07/15/2024   Pneumococcal Vaccine: 50+ Years (3 of 3 - PCV20 or PCV21) 04/08/2025 (Originally 05/25/2022)   Diabetic kidney evaluation - eGFR measurement  06/10/2025   Medicare Annual Wellness (AWV)  07/21/2025   DTaP/Tdap/Td (6 - Td or Tdap) 06/23/2033   Hepatitis C Screening  Completed   Hepatitis B Vaccines 19-59 Average Risk  Aged Out   HPV VACCINES  Aged Out   Meningococcal B Vaccine  Aged Out        Assessment/Plan:  This is a routine wellness examination for Jarreau.  Patient Care Team: Sowles, Krichna, MD as PCP - General (Family Medicine) Dozier Oneil BROCKS, MD as Referring Physician (Psychiatry) Loreda,  Cordella, DPM as Consulting Physician (Podiatry) Mitch Alm Pickles, MD (Inactive) as Referring Physician (Urology) Babara Call, MD as Consulting Physician (Hematology) Darliss Rogue, MD as Consulting Physician (Cardiology) Jaye Fallow, MD as Referring Physician (Ophthalmology)  I have personally reviewed and noted the following in the patients chart:   Medical and social history Use of alcohol, tobacco or illicit drugs  Current medications and supplements including opioid prescriptions. Functional ability and status Nutritional status Physical activity Advanced directives List of other physicians Hospitalizations, surgeries, and ER visits in previous 12 months Vitals Screenings to include cognitive, depression, and falls Referrals and appointments  No orders of the defined types were placed in this encounter.  In addition, I have reviewed and discussed with patient certain preventive protocols, quality metrics, and best practice recommendations. A written personalized care plan for preventive services as well as general preventive health recommendations were  provided to patient.   Jhonnie GORMAN Das, LPN   [id number]   Return in 1 year (on 07/21/2025).  After Visit Summary: (MyChart) Due to this being a telephonic visit, the after visit summary with patients personalized plan was offered to patient via MyChart   Nurse Notes: UTD ON SHOTS;  UTD ON COLONOSCOPY

## 2024-07-21 NOTE — Patient Instructions (Addendum)
 [name],  Thank you for taking the time for your Medicare Wellness Visit. I appreciate your continued commitment to your health goals. Please review the care plan we discussed, and feel free to reach out if I can assist you further.  Please note that Annual Wellness Visits do not include a physical exam. Some assessments may be limited, especially if the visit was conducted virtually. If needed, we may recommend an in-person follow-up with your provider.  Ongoing Care Seeing your primary care provider every 3 to 6 months helps us  monitor your health and provide consistent, personalized care.   Referrals If a referral was made during today's visit and you haven't received any updates within two weeks, please contact the referred provider directly to check on the status.  Recommended Screenings:  Health Maintenance  Topic Date Due   HIV Screening  Never done   Yearly kidney health urinalysis for diabetes  09/28/2019   Zoster (Shingles) Vaccine (2 of 2) 02/15/2022   Flu Shot  03/04/2024   COVID-19 Vaccine (5 - 2025-26 season) 04/04/2024   Cologuard (Stool DNA test)  07/15/2024   Pneumococcal Vaccine for age over 46 (3 of 3 - PCV20 or PCV21) 04/08/2025*   Yearly kidney function blood test for diabetes  06/10/2025   Medicare Annual Wellness Visit  07/21/2025   DTaP/Tdap/Td vaccine (6 - Td or Tdap) 06/23/2033   Hepatitis C Screening  Completed   Hepatitis B Vaccine  Aged Out   HPV Vaccine  Aged Out   Meningitis B Vaccine  Aged Out  *Topic was postponed. The date shown is not the original due date.     Vision: Annual vision screenings are recommended for early detection of glaucoma, cataracts, and diabetic retinopathy. These exams can also reveal signs of chronic conditions such as diabetes and high blood pressure.  Dental: Annual dental screenings help detect early signs of oral cancer, gum disease, and other conditions linked to overall health, including heart disease and  diabetes.  Please see the attached documents for additional preventive care recommendations.   NEXT AWV 08/03/25 @ 9:30 AM IN PERSON W/ GUARDIAN

## 2024-07-22 ENCOUNTER — Telehealth: Payer: Self-pay

## 2024-07-22 MED ORDER — VITAMIN D (ERGOCALCIFEROL) 1.25 MG (50000 UNIT) PO CAPS: 50000.0000 [IU] | ORAL_CAPSULE | ORAL | 2 refills | Status: AC

## 2024-07-22 MED ORDER — MELOXICAM 15 MG PO TABS: 15.0000 mg | ORAL_TABLET | Freq: Every day | ORAL | 2 refills | Status: AC | PRN

## 2024-07-22 NOTE — Telephone Encounter (Signed)
 Called pharmacy spoke to [facility] to cancel rx and called patient no answer left detailed vm

## 2024-07-22 NOTE — Telephone Encounter (Signed)
 Last rx was every 14 days new is every 7 how would you like for him to take?

## 2024-07-22 NOTE — Telephone Encounter (Signed)
 Copied from CRM #[id number]. Topic: Clinical - Prescription Issue >> Jul 22, 2024 11:20 AM Viola FALCON wrote: Reason for CRM: Savanna from [facility] called regarding clarification for the Vitamin D , Ergocalciferol , (DRISDOL ) 1.25 MG (50000 UNIT) CAPS capsule that was sent today. Please call her at [phone]

## 2024-07-25 ENCOUNTER — Ambulatory Visit: Admitting: Podiatry

## 2024-07-25 ENCOUNTER — Encounter: Payer: Self-pay | Admitting: Podiatry

## 2024-07-25 DIAGNOSIS — M79675 Pain in left toe(s): Secondary | ICD-10-CM

## 2024-07-25 DIAGNOSIS — D689 Coagulation defect, unspecified: Secondary | ICD-10-CM

## 2024-07-25 DIAGNOSIS — L84 Corns and callosities: Secondary | ICD-10-CM | POA: Diagnosis not present

## 2024-07-25 DIAGNOSIS — B351 Tinea unguium: Secondary | ICD-10-CM

## 2024-07-25 DIAGNOSIS — M79674 Pain in right toe(s): Secondary | ICD-10-CM

## 2024-07-25 DIAGNOSIS — E119 Type 2 diabetes mellitus without complications: Secondary | ICD-10-CM | POA: Diagnosis not present

## 2024-07-25 NOTE — Progress Notes (Signed)
"  °  Subjective:  Patient ID: Wayne Jones, male    DOB: 24-Apr-1961,  MRN: [id number]  Wayne Jones presents to clinic today for at risk foot care with h/o clotting disorder and callus(es) b/l feet and painful mycotic toenails that are difficult to trim. Painful toenails interfere with ambulation. Aggravating factors include wearing enclosed shoe gear. Pain is relieved with periodic professional debridement. Painful calluses are aggravated when weightbearing with and without shoegear. Pain is relieved with periodic professional debridement. He is accompanied by caregiver, Narvis, on today's visit. They voice no new pedal concerns on today's visit. Chief Complaint  Patient presents with   Nail Problem    He saw Dr. Glenard in Nov. He denies being diabetic   New problem(s): None.   PCP is Glenard Mire, MD.  Allergies[1]  Review of Systems: Negative except as noted in the HPI.  Objective: No changes noted in today's physical examination. There were no vitals filed for this visit. Wayne Jones is a pleasant 63 y.o. male in NAD. AAO x 3.  Vascular Examination: Capillary refill time immediate b/l. Palpable pedal pulses. Pedal hair present b/l. No pain with calf compression b/l. Skin temperature gradient WNL b/l. No cyanosis or clubbing b/l. No ischemia or gangrene noted b/l. No edema noted b/l LE.  Neurological Examination: Sensation grossly intact b/l with 10 gram monofilament. Vibratory sensation intact b/l.   Dermatological Examination: Pedal skin with normal turgor, texture and tone b/l.  No open wounds. No interdigital macerations.   Toenails 1-5 b/l thick, discolored, elongated with subungual debris and pain on dorsal palpation.   Hyperkeratotic lesion(s) submet head 1 b/l.  No erythema, no edema, no drainage, no fluctuance.  Musculoskeletal Examination: Muscle strength 5/5 to all lower extremity muscle groups bilaterally. Hammertoe deformity noted 2-5 b/l.  Radiographs:  None  Assessment/Plan: 1. Pain due to onychomycosis of toenails of both feet   2. Callus   3. Clotting disorder   Consent given for treatment. Patient examined.All patient's and/or POA's questions/concerns addressed on today's visit. Toenails 1-5 b/l debrided in length and girth without incident. Callus(es) submet head 1 b/l pared with sharp debridement without incident. Continue foot and shoe inspections daily. Continue soft, supportive shoe gear daily. Report any pedal injuries to medical professional. Call office if there are any questions/concerns. -Patient/POA to call should there be question/concern in the interim.   Return in about 3 months (around 10/23/2024).  Delon LITTIE Merlin, DPM      [place] LOCATION: [address] [place] [address]                   Office [phone]   [facility] LOCATION: [address] [place], [place] [address] Office [phone]      [1] No Known Allergies  "

## 2024-10-06 ENCOUNTER — Ambulatory Visit: Admitting: Family Medicine

## 2024-10-24 ENCOUNTER — Ambulatory Visit: Admitting: Podiatry

## 2025-06-09 ENCOUNTER — Inpatient Hospital Stay: Admitting: Oncology

## 2025-06-09 ENCOUNTER — Inpatient Hospital Stay

## 2025-08-03 ENCOUNTER — Ambulatory Visit
